# Patient Record
Sex: Male | Born: 1945
Health system: Southern US, Community
[De-identification: ages and names within clinical notes are randomized; demographics above are authoritative.]

## PROBLEM LIST (undated history)

## (undated) DIAGNOSIS — D649 Anemia, unspecified: Secondary | ICD-10-CM

## (undated) DIAGNOSIS — K922 Gastrointestinal hemorrhage, unspecified: Secondary | ICD-10-CM

## (undated) DIAGNOSIS — C801 Malignant (primary) neoplasm, unspecified: Secondary | ICD-10-CM

## (undated) DIAGNOSIS — N402 Nodular prostate without lower urinary tract symptoms: Secondary | ICD-10-CM

## (undated) DIAGNOSIS — K279 Peptic ulcer, site unspecified, unspecified as acute or chronic, without hemorrhage or perforation: Secondary | ICD-10-CM

## (undated) DIAGNOSIS — R972 Elevated prostate specific antigen [PSA]: Secondary | ICD-10-CM

## (undated) DIAGNOSIS — I10 Essential (primary) hypertension: Secondary | ICD-10-CM

## (undated) DIAGNOSIS — N400 Enlarged prostate without lower urinary tract symptoms: Secondary | ICD-10-CM

## (undated) DIAGNOSIS — E785 Hyperlipidemia, unspecified: Secondary | ICD-10-CM

## (undated) DIAGNOSIS — J449 Chronic obstructive pulmonary disease, unspecified: Secondary | ICD-10-CM

## (undated) HISTORY — DX: Hyperlipidemia, unspecified: E78.5

## (undated) HISTORY — DX: Chronic obstructive pulmonary disease, unspecified: J44.9

## (undated) HISTORY — DX: Gastrointestinal hemorrhage, unspecified: K92.2

## (undated) HISTORY — DX: Elevated prostate specific antigen (PSA): R97.20

## (undated) HISTORY — DX: Benign prostatic hyperplasia without lower urinary tract symptoms: N40.0

## (undated) HISTORY — DX: Peptic ulcer, site unspecified, unspecified as acute or chronic, without hemorrhage or perforation: K27.9

## (undated) HISTORY — DX: Anemia, unspecified: D64.9

## (undated) HISTORY — DX: Nodular prostate without lower urinary tract symptoms: N40.2

## (undated) HISTORY — DX: Essential (primary) hypertension: I10

---

## 2008-03-16 ENCOUNTER — Encounter: Payer: Self-pay | Admitting: Orthopedic Surgery

## 2009-12-27 ENCOUNTER — Ambulatory Visit: Payer: Self-pay | Admitting: Family Medicine

## 2011-04-15 DIAGNOSIS — L570 Actinic keratosis: Secondary | ICD-10-CM | POA: Diagnosis not present

## 2011-07-08 DIAGNOSIS — E7800 Pure hypercholesterolemia, unspecified: Secondary | ICD-10-CM | POA: Diagnosis not present

## 2011-07-08 DIAGNOSIS — I10 Essential (primary) hypertension: Secondary | ICD-10-CM | POA: Diagnosis not present

## 2011-08-21 DIAGNOSIS — B0233 Zoster keratitis: Secondary | ICD-10-CM | POA: Diagnosis not present

## 2011-08-23 DIAGNOSIS — H109 Unspecified conjunctivitis: Secondary | ICD-10-CM | POA: Diagnosis not present

## 2011-08-23 DIAGNOSIS — B0233 Zoster keratitis: Secondary | ICD-10-CM | POA: Diagnosis not present

## 2011-10-08 DIAGNOSIS — L570 Actinic keratosis: Secondary | ICD-10-CM | POA: Diagnosis not present

## 2011-10-08 DIAGNOSIS — Z872 Personal history of diseases of the skin and subcutaneous tissue: Secondary | ICD-10-CM | POA: Diagnosis not present

## 2012-01-27 DIAGNOSIS — I10 Essential (primary) hypertension: Secondary | ICD-10-CM | POA: Diagnosis not present

## 2012-01-27 DIAGNOSIS — E785 Hyperlipidemia, unspecified: Secondary | ICD-10-CM | POA: Diagnosis not present

## 2012-01-27 DIAGNOSIS — Z125 Encounter for screening for malignant neoplasm of prostate: Secondary | ICD-10-CM | POA: Diagnosis not present

## 2012-01-27 DIAGNOSIS — Z0000 Encounter for general adult medical examination without abnormal findings: Secondary | ICD-10-CM | POA: Diagnosis not present

## 2012-02-26 DIAGNOSIS — Z23 Encounter for immunization: Secondary | ICD-10-CM | POA: Diagnosis not present

## 2012-06-18 DIAGNOSIS — L570 Actinic keratosis: Secondary | ICD-10-CM | POA: Diagnosis not present

## 2012-07-24 DIAGNOSIS — I10 Essential (primary) hypertension: Secondary | ICD-10-CM | POA: Diagnosis not present

## 2012-07-27 DIAGNOSIS — I10 Essential (primary) hypertension: Secondary | ICD-10-CM | POA: Diagnosis not present

## 2012-10-07 DIAGNOSIS — Z872 Personal history of diseases of the skin and subcutaneous tissue: Secondary | ICD-10-CM | POA: Diagnosis not present

## 2012-10-07 DIAGNOSIS — Z1283 Encounter for screening for malignant neoplasm of skin: Secondary | ICD-10-CM | POA: Diagnosis not present

## 2012-10-07 DIAGNOSIS — L570 Actinic keratosis: Secondary | ICD-10-CM | POA: Diagnosis not present

## 2012-11-10 DIAGNOSIS — A4901 Methicillin susceptible Staphylococcus aureus infection, unspecified site: Secondary | ICD-10-CM | POA: Diagnosis not present

## 2012-11-10 DIAGNOSIS — L723 Sebaceous cyst: Secondary | ICD-10-CM | POA: Diagnosis not present

## 2012-11-12 DIAGNOSIS — L0291 Cutaneous abscess, unspecified: Secondary | ICD-10-CM | POA: Diagnosis not present

## 2012-11-12 DIAGNOSIS — A4901 Methicillin susceptible Staphylococcus aureus infection, unspecified site: Secondary | ICD-10-CM | POA: Diagnosis not present

## 2012-11-16 DIAGNOSIS — L723 Sebaceous cyst: Secondary | ICD-10-CM | POA: Diagnosis not present

## 2012-12-22 DIAGNOSIS — Z23 Encounter for immunization: Secondary | ICD-10-CM | POA: Diagnosis not present

## 2013-02-01 DIAGNOSIS — D509 Iron deficiency anemia, unspecified: Secondary | ICD-10-CM | POA: Diagnosis not present

## 2013-02-01 DIAGNOSIS — E785 Hyperlipidemia, unspecified: Secondary | ICD-10-CM | POA: Diagnosis not present

## 2013-02-01 DIAGNOSIS — Z125 Encounter for screening for malignant neoplasm of prostate: Secondary | ICD-10-CM | POA: Diagnosis not present

## 2013-02-01 DIAGNOSIS — I10 Essential (primary) hypertension: Secondary | ICD-10-CM | POA: Diagnosis not present

## 2013-02-01 DIAGNOSIS — J019 Acute sinusitis, unspecified: Secondary | ICD-10-CM | POA: Diagnosis not present

## 2013-02-01 DIAGNOSIS — Z0000 Encounter for general adult medical examination without abnormal findings: Secondary | ICD-10-CM | POA: Diagnosis not present

## 2013-02-04 DIAGNOSIS — D509 Iron deficiency anemia, unspecified: Secondary | ICD-10-CM | POA: Diagnosis not present

## 2013-02-04 DIAGNOSIS — Z8719 Personal history of other diseases of the digestive system: Secondary | ICD-10-CM | POA: Diagnosis not present

## 2013-02-15 ENCOUNTER — Ambulatory Visit: Payer: Self-pay

## 2013-02-15 DIAGNOSIS — R05 Cough: Secondary | ICD-10-CM | POA: Diagnosis not present

## 2013-02-15 DIAGNOSIS — R059 Cough, unspecified: Secondary | ICD-10-CM | POA: Diagnosis not present

## 2013-02-15 DIAGNOSIS — J189 Pneumonia, unspecified organism: Secondary | ICD-10-CM | POA: Diagnosis not present

## 2013-02-17 DIAGNOSIS — J449 Chronic obstructive pulmonary disease, unspecified: Secondary | ICD-10-CM | POA: Diagnosis not present

## 2013-02-17 DIAGNOSIS — D539 Nutritional anemia, unspecified: Secondary | ICD-10-CM | POA: Diagnosis not present

## 2013-02-18 HISTORY — PX: ESOPHAGOGASTRODUODENOSCOPY: SHX1529

## 2013-02-18 HISTORY — PX: COLONOSCOPY: SHX174

## 2013-03-12 DIAGNOSIS — J449 Chronic obstructive pulmonary disease, unspecified: Secondary | ICD-10-CM | POA: Diagnosis not present

## 2013-03-12 DIAGNOSIS — D539 Nutritional anemia, unspecified: Secondary | ICD-10-CM | POA: Diagnosis not present

## 2013-03-12 DIAGNOSIS — J4489 Other specified chronic obstructive pulmonary disease: Secondary | ICD-10-CM | POA: Diagnosis not present

## 2013-03-16 ENCOUNTER — Ambulatory Visit: Payer: Self-pay | Admitting: Gastroenterology

## 2013-03-16 DIAGNOSIS — D509 Iron deficiency anemia, unspecified: Secondary | ICD-10-CM | POA: Diagnosis not present

## 2013-03-16 DIAGNOSIS — D131 Benign neoplasm of stomach: Secondary | ICD-10-CM | POA: Diagnosis not present

## 2013-03-16 DIAGNOSIS — Z8711 Personal history of peptic ulcer disease: Secondary | ICD-10-CM | POA: Diagnosis not present

## 2013-03-16 DIAGNOSIS — K644 Residual hemorrhoidal skin tags: Secondary | ICD-10-CM | POA: Diagnosis not present

## 2013-03-16 DIAGNOSIS — I10 Essential (primary) hypertension: Secondary | ICD-10-CM | POA: Diagnosis not present

## 2013-03-16 DIAGNOSIS — Z79899 Other long term (current) drug therapy: Secondary | ICD-10-CM | POA: Diagnosis not present

## 2013-03-16 DIAGNOSIS — D649 Anemia, unspecified: Secondary | ICD-10-CM | POA: Diagnosis not present

## 2013-03-16 DIAGNOSIS — Z7982 Long term (current) use of aspirin: Secondary | ICD-10-CM | POA: Diagnosis not present

## 2013-03-16 DIAGNOSIS — K2100 Gastro-esophageal reflux disease with esophagitis, without bleeding: Secondary | ICD-10-CM | POA: Diagnosis not present

## 2013-03-16 DIAGNOSIS — K449 Diaphragmatic hernia without obstruction or gangrene: Secondary | ICD-10-CM | POA: Diagnosis not present

## 2013-03-16 DIAGNOSIS — K648 Other hemorrhoids: Secondary | ICD-10-CM | POA: Diagnosis not present

## 2013-03-18 LAB — PATHOLOGY REPORT

## 2013-04-07 DIAGNOSIS — D500 Iron deficiency anemia secondary to blood loss (chronic): Secondary | ICD-10-CM | POA: Diagnosis not present

## 2013-04-07 DIAGNOSIS — K2100 Gastro-esophageal reflux disease with esophagitis, without bleeding: Secondary | ICD-10-CM | POA: Diagnosis not present

## 2013-04-07 DIAGNOSIS — K219 Gastro-esophageal reflux disease without esophagitis: Secondary | ICD-10-CM | POA: Diagnosis not present

## 2013-04-20 DIAGNOSIS — D509 Iron deficiency anemia, unspecified: Secondary | ICD-10-CM | POA: Diagnosis not present

## 2013-06-21 ENCOUNTER — Ambulatory Visit: Payer: Self-pay | Admitting: Gastroenterology

## 2013-06-21 DIAGNOSIS — K2100 Gastro-esophageal reflux disease with esophagitis, without bleeding: Secondary | ICD-10-CM | POA: Diagnosis not present

## 2013-06-21 DIAGNOSIS — Z79899 Other long term (current) drug therapy: Secondary | ICD-10-CM | POA: Diagnosis not present

## 2013-06-21 DIAGNOSIS — K449 Diaphragmatic hernia without obstruction or gangrene: Secondary | ICD-10-CM | POA: Diagnosis not present

## 2013-06-21 DIAGNOSIS — I10 Essential (primary) hypertension: Secondary | ICD-10-CM | POA: Diagnosis not present

## 2013-06-21 DIAGNOSIS — K209 Esophagitis, unspecified without bleeding: Secondary | ICD-10-CM | POA: Diagnosis not present

## 2013-06-21 DIAGNOSIS — Z8711 Personal history of peptic ulcer disease: Secondary | ICD-10-CM | POA: Diagnosis not present

## 2013-06-21 DIAGNOSIS — Z7982 Long term (current) use of aspirin: Secondary | ICD-10-CM | POA: Diagnosis not present

## 2013-06-21 DIAGNOSIS — K219 Gastro-esophageal reflux disease without esophagitis: Secondary | ICD-10-CM | POA: Diagnosis not present

## 2013-06-21 DIAGNOSIS — D131 Benign neoplasm of stomach: Secondary | ICD-10-CM | POA: Diagnosis not present

## 2013-06-21 DIAGNOSIS — D509 Iron deficiency anemia, unspecified: Secondary | ICD-10-CM | POA: Diagnosis not present

## 2013-06-22 LAB — PATHOLOGY REPORT

## 2013-06-28 DIAGNOSIS — D509 Iron deficiency anemia, unspecified: Secondary | ICD-10-CM | POA: Diagnosis not present

## 2013-08-09 DIAGNOSIS — I10 Essential (primary) hypertension: Secondary | ICD-10-CM | POA: Diagnosis not present

## 2013-08-18 DIAGNOSIS — D509 Iron deficiency anemia, unspecified: Secondary | ICD-10-CM | POA: Diagnosis not present

## 2013-10-05 DIAGNOSIS — H5210 Myopia, unspecified eye: Secondary | ICD-10-CM | POA: Diagnosis not present

## 2013-10-05 DIAGNOSIS — H52229 Regular astigmatism, unspecified eye: Secondary | ICD-10-CM | POA: Diagnosis not present

## 2013-10-05 DIAGNOSIS — H524 Presbyopia: Secondary | ICD-10-CM | POA: Diagnosis not present

## 2013-10-05 DIAGNOSIS — H2510 Age-related nuclear cataract, unspecified eye: Secondary | ICD-10-CM | POA: Diagnosis not present

## 2013-10-13 DIAGNOSIS — L570 Actinic keratosis: Secondary | ICD-10-CM | POA: Diagnosis not present

## 2013-10-13 DIAGNOSIS — Z872 Personal history of diseases of the skin and subcutaneous tissue: Secondary | ICD-10-CM | POA: Diagnosis not present

## 2013-10-13 DIAGNOSIS — Z1283 Encounter for screening for malignant neoplasm of skin: Secondary | ICD-10-CM | POA: Diagnosis not present

## 2013-11-09 DIAGNOSIS — Z23 Encounter for immunization: Secondary | ICD-10-CM | POA: Diagnosis not present

## 2014-02-15 DIAGNOSIS — J449 Chronic obstructive pulmonary disease, unspecified: Secondary | ICD-10-CM | POA: Diagnosis not present

## 2014-02-15 DIAGNOSIS — E785 Hyperlipidemia, unspecified: Secondary | ICD-10-CM | POA: Diagnosis not present

## 2014-02-15 DIAGNOSIS — Z0000 Encounter for general adult medical examination without abnormal findings: Secondary | ICD-10-CM | POA: Diagnosis not present

## 2014-02-15 DIAGNOSIS — D649 Anemia, unspecified: Secondary | ICD-10-CM | POA: Diagnosis not present

## 2014-02-15 DIAGNOSIS — Z125 Encounter for screening for malignant neoplasm of prostate: Secondary | ICD-10-CM | POA: Diagnosis not present

## 2014-02-15 DIAGNOSIS — I10 Essential (primary) hypertension: Secondary | ICD-10-CM | POA: Diagnosis not present

## 2014-02-15 DIAGNOSIS — K922 Gastrointestinal hemorrhage, unspecified: Secondary | ICD-10-CM | POA: Diagnosis not present

## 2014-02-15 DIAGNOSIS — Z23 Encounter for immunization: Secondary | ICD-10-CM | POA: Diagnosis not present

## 2014-03-21 DIAGNOSIS — N402 Nodular prostate without lower urinary tract symptoms: Secondary | ICD-10-CM | POA: Diagnosis not present

## 2014-03-21 DIAGNOSIS — R972 Elevated prostate specific antigen [PSA]: Secondary | ICD-10-CM | POA: Diagnosis not present

## 2014-06-08 DIAGNOSIS — B0052 Herpesviral keratitis: Secondary | ICD-10-CM | POA: Diagnosis not present

## 2014-06-10 DIAGNOSIS — B0052 Herpesviral keratitis: Secondary | ICD-10-CM | POA: Diagnosis not present

## 2014-06-13 DIAGNOSIS — B0052 Herpesviral keratitis: Secondary | ICD-10-CM | POA: Diagnosis not present

## 2014-06-28 DIAGNOSIS — I10 Essential (primary) hypertension: Secondary | ICD-10-CM | POA: Insufficient documentation

## 2014-06-28 DIAGNOSIS — D649 Anemia, unspecified: Secondary | ICD-10-CM | POA: Insufficient documentation

## 2014-06-28 DIAGNOSIS — E785 Hyperlipidemia, unspecified: Secondary | ICD-10-CM | POA: Insufficient documentation

## 2014-06-28 DIAGNOSIS — J449 Chronic obstructive pulmonary disease, unspecified: Secondary | ICD-10-CM | POA: Insufficient documentation

## 2014-08-01 DIAGNOSIS — B0052 Herpesviral keratitis: Secondary | ICD-10-CM | POA: Diagnosis not present

## 2014-08-03 DIAGNOSIS — B0052 Herpesviral keratitis: Secondary | ICD-10-CM | POA: Diagnosis not present

## 2014-08-08 DIAGNOSIS — B0052 Herpesviral keratitis: Secondary | ICD-10-CM | POA: Diagnosis not present

## 2014-08-10 DIAGNOSIS — B0052 Herpesviral keratitis: Secondary | ICD-10-CM | POA: Diagnosis not present

## 2014-08-17 ENCOUNTER — Encounter: Payer: Self-pay | Admitting: Family Medicine

## 2014-08-17 ENCOUNTER — Ambulatory Visit (INDEPENDENT_AMBULATORY_CARE_PROVIDER_SITE_OTHER): Payer: Medicare Other | Admitting: Family Medicine

## 2014-08-17 VITALS — BP 114/65 | HR 56 | Temp 97.7°F | Ht 67.7 in | Wt 205.0 lb

## 2014-08-17 DIAGNOSIS — E785 Hyperlipidemia, unspecified: Secondary | ICD-10-CM | POA: Diagnosis not present

## 2014-08-17 DIAGNOSIS — K922 Gastrointestinal hemorrhage, unspecified: Secondary | ICD-10-CM | POA: Diagnosis not present

## 2014-08-17 DIAGNOSIS — R5383 Other fatigue: Secondary | ICD-10-CM | POA: Diagnosis not present

## 2014-08-17 DIAGNOSIS — I10 Essential (primary) hypertension: Secondary | ICD-10-CM

## 2014-08-17 DIAGNOSIS — G479 Sleep disorder, unspecified: Secondary | ICD-10-CM

## 2014-08-17 DIAGNOSIS — J42 Unspecified chronic bronchitis: Secondary | ICD-10-CM

## 2014-08-17 MED ORDER — LOSARTAN POTASSIUM 100 MG PO TABS: 100.0000 mg | ORAL_TABLET | Freq: Every day | ORAL | Status: DC

## 2014-08-17 MED ORDER — AMLODIPINE BESYLATE 10 MG PO TABS: 10.0000 mg | ORAL_TABLET | Freq: Every day | ORAL | Status: DC

## 2014-08-17 MED ORDER — PANTOPRAZOLE SODIUM 40 MG PO TBEC: 40.0000 mg | DELAYED_RELEASE_TABLET | Freq: Every day | ORAL | Status: DC

## 2014-08-17 MED ORDER — AZELASTINE HCL 0.15 % NA SOLN: 2.0000 | NASAL | Status: DC

## 2014-08-17 MED ORDER — FLUTICASONE PROPIONATE 50 MCG/ACT NA SUSP: 2.0000 | Freq: Every day | NASAL | Status: DC

## 2014-08-17 MED ORDER — CARVEDILOL 25 MG PO TABS: 25.0000 mg | ORAL_TABLET | Freq: Two times a day (BID) | ORAL | Status: DC

## 2014-08-17 MED ORDER — ALBUTEROL SULFATE HFA 108 (90 BASE) MCG/ACT IN AERS: 2.0000 | INHALATION_SPRAY | Freq: Four times a day (QID) | RESPIRATORY_TRACT | Status: DC | PRN

## 2014-08-17 NOTE — Assessment & Plan Note (Signed)
Diet controled 

## 2014-08-17 NOTE — Assessment & Plan Note (Signed)
The current medical regimen is effective;  continue present plan and medications.  

## 2014-08-17 NOTE — Progress Notes (Signed)
BP 114/65 mmHg  Pulse 56  Temp(Src) 97.7 F (36.5 C)  Ht 5' 7.7" (1.72 m)  Wt 205 lb (92.987 kg)  BMI 31.43 kg/m2  SpO2 97%   Subjective:    Patient ID: Jonathan Burns, male    DOB: 17-Jun-1945, 69 y.o.   MRN: [id number]  HPI: Jonathan Burns is a 69 y.o. male  Chief Complaint  Patient presents with  . COPD  . Hypertension  . Gastrophageal Reflux  doing well long term Problems from last visit stable Leg no worse maybe better Breathing well  Good BP control stomach and reflux no sx Takes meds every day Discussed OSA sx  Wife cant sleep with pt due to severe snoring and apnea spells Has to nap at lunch to get through thew day See sleep questions Relevant past medical, surgical, family and social history reviewed and updated as indicated. Interim medical history since our last visit reviewed. Allergies and medications reviewed and updated.  Review of Systems  Constitutional: Negative.   Respiratory: Negative.   Cardiovascular: Negative.   Gastrointestinal: Negative.     Per HPI unless specifically indicated above     Objective:    BP 114/65 mmHg  Pulse 56  Temp(Src) 97.7 F (36.5 C)  Ht 5' 7.7" (1.72 m)  Wt 205 lb (92.987 kg)  BMI 31.43 kg/m2  SpO2 97%  Wt Readings from Last 3 Encounters:  08/17/14 205 lb (92.987 kg)  02/15/14 207 lb (93.895 kg)    Physical Exam  Constitutional: He is oriented to person, place, and time. He appears well-developed and well-nourished. No distress.  HENT:  Head: Normocephalic and atraumatic.  Right Ear: Hearing normal.  Left Ear: Hearing normal.  Nose: Nose normal.  Eyes: Conjunctivae and lids are normal. Right eye exhibits no discharge. Left eye exhibits no discharge. No scleral icterus.  Neck:  Neck circ 16'  Cardiovascular: Normal rate, regular rhythm and normal heart sounds.   Pulmonary/Chest: Effort normal and breath sounds normal. No respiratory distress.  Musculoskeletal: Normal range of motion.  Neurological: He  is alert and oriented to person, place, and time.  Skin: Skin is intact. No rash noted.  Psychiatric: He has a normal mood and affect. His speech is normal and behavior is normal. Judgment and thought content normal. Cognition and memory are normal.        Assessment & Plan:   Problem List Items Addressed This Visit      Cardiovascular and Mediastinum   Hypertension - Primary    The current medical regimen is effective;  continue present plan and medications.       Relevant Medications   amLODipine (NORVASC) 10 MG tablet   carvedilol (COREG) 25 MG tablet   losartan (COZAAR) 100 MG tablet   Other Relevant Orders   Basic metabolic panel     Respiratory   COPD (chronic obstructive pulmonary disease)    The current medical regimen is effective;  continue present plan and medications.       Relevant Medications   albuterol (PROVENTIL HFA;VENTOLIN HFA) 108 (90 BASE) MCG/ACT inhaler   Azelastine HCl 0.15 % SOLN   fluticasone (FLONASE) 50 MCG/ACT nasal spray     Digestive   Chronic GI bleeding    The current medical regimen is effective;  continue present plan and medications.       Relevant Medications   pantoprazole (PROTONIX) 40 MG tablet     Other   Hyperlipidemia    Diet controled  Relevant Medications   amLODipine (NORVASC) 10 MG tablet   carvedilol (COREG) 25 MG tablet   losartan (COZAAR) 100 MG tablet       Follow up plan: Return in about 6 months (around 02/16/2015) for Physical Exam.

## 2014-08-18 LAB — BASIC METABOLIC PANEL
BUN / CREAT RATIO: 12 (ref 10–22)
BUN: 13 mg/dL (ref 8–27)
CHLORIDE: 100 mmol/L (ref 97–108)
CO2: 24 mmol/L (ref 18–29)
Calcium: 9.2 mg/dL (ref 8.6–10.2)
Creatinine, Ser: 1.07 mg/dL (ref 0.76–1.27)
GFR, EST AFRICAN AMERICAN: 81 mL/min/{1.73_m2} (ref >59)
GFR, EST NON AFRICAN AMERICAN: 70 mL/min/{1.73_m2} (ref >59)
Glucose: 86 mg/dL (ref 65–99)
Potassium: 5.3 mmol/L — ABNORMAL HIGH (ref 3.5–5.2)
SODIUM: 138 mmol/L (ref 134–144)

## 2014-08-29 DIAGNOSIS — G4733 Obstructive sleep apnea (adult) (pediatric): Secondary | ICD-10-CM | POA: Diagnosis not present

## 2014-08-30 ENCOUNTER — Telehealth: Payer: Self-pay | Admitting: Family Medicine

## 2014-08-30 DIAGNOSIS — D500 Iron deficiency anemia secondary to blood loss (chronic): Secondary | ICD-10-CM

## 2014-08-30 DIAGNOSIS — G4733 Obstructive sleep apnea (adult) (pediatric): Secondary | ICD-10-CM | POA: Diagnosis not present

## 2014-08-30 NOTE — Telephone Encounter (Signed)
Pt went in for sleep study last night and says he failed and now has to go back tonight to be fitted for his mask and the person conducting the study asked for him to have something similar to [place] so that he goes into a deeper sleep. Pt would like [place] to Send it to [employer]

## 2014-08-31 ENCOUNTER — Other Ambulatory Visit: Payer: Self-pay | Admitting: Family Medicine

## 2014-08-31 ENCOUNTER — Telehealth: Payer: Self-pay | Admitting: Family Medicine

## 2014-08-31 DIAGNOSIS — G4733 Obstructive sleep apnea (adult) (pediatric): Secondary | ICD-10-CM

## 2014-08-31 DIAGNOSIS — Z9989 Dependence on other enabling machines and devices: Principal | ICD-10-CM

## 2014-08-31 DIAGNOSIS — G2581 Restless legs syndrome: Secondary | ICD-10-CM

## 2014-08-31 DIAGNOSIS — K922 Gastrointestinal hemorrhage, unspecified: Secondary | ICD-10-CM

## 2014-08-31 MED ORDER — ZOLPIDEM TARTRATE 10 MG PO TABS: 10.0000 mg | ORAL_TABLET | Freq: Every evening | ORAL | Status: DC | PRN

## 2014-08-31 NOTE — Telephone Encounter (Signed)
Call pt re sleep apnea

## 2014-08-31 NOTE — Addendum Note (Signed)
Addended byGolden Pop on: 08/31/2014 05:09 PM   Modules accepted: Orders

## 2014-08-31 NOTE — Telephone Encounter (Signed)
Pt new dx of OSA and restless legs Will check ferritin

## 2014-08-31 NOTE — Telephone Encounter (Signed)
Calling about sleep report

## 2014-09-01 ENCOUNTER — Other Ambulatory Visit: Payer: Medicare Other

## 2014-09-01 DIAGNOSIS — K922 Gastrointestinal hemorrhage, unspecified: Secondary | ICD-10-CM

## 2014-09-01 DIAGNOSIS — G2581 Restless legs syndrome: Secondary | ICD-10-CM

## 2014-09-02 LAB — FERRITIN: Ferritin: 10 ng/mL — ABNORMAL LOW (ref 30–400)

## 2014-09-06 NOTE — Progress Notes (Signed)
Phone call with patient ferritin done because of restless leg syndrome discovered on CPAP testing Patient's ferritin of 10 patient had complete GI workup last year Will check CBC and call gastroenterology for further advice on need for workup may or may not need.

## 2014-09-07 ENCOUNTER — Telehealth: Payer: Self-pay | Admitting: Family Medicine

## 2014-09-07 ENCOUNTER — Other Ambulatory Visit: Payer: Medicare Other

## 2014-09-07 DIAGNOSIS — D500 Iron deficiency anemia secondary to blood loss (chronic): Secondary | ICD-10-CM

## 2014-09-07 MED ORDER — IRON 325 (65 FE) MG PO TABS: 1.0000 | ORAL_TABLET | Freq: Every day | ORAL | Status: DC

## 2014-09-07 NOTE — Telephone Encounter (Signed)
Pt called stated [facility] was supposed to send an RX to [place] for him yesterday, the pharmacy has not received it. RX is for iron pills. Please resend. Thanks.

## 2014-09-07 NOTE — Addendum Note (Signed)
Addended byGolden Pop on: 09/07/2014 05:18 PM   Modules accepted: Orders

## 2014-09-07 NOTE — Telephone Encounter (Signed)
Do you know what Rx patient is talking about?

## 2014-09-08 LAB — CBC WITH DIFFERENTIAL/PLATELET
Basophils Absolute: 0 10*3/uL (ref 0.0–0.2)
Basos: 1 %
EOS (ABSOLUTE): 0.3 10*3/uL (ref 0.0–0.4)
EOS: 5 %
Hematocrit: 31.2 % — ABNORMAL LOW (ref 37.5–51.0)
Hemoglobin: 9.3 g/dL — ABNORMAL LOW (ref 12.6–17.7)
IMMATURE GRANULOCYTES: 0 %
Immature Grans (Abs): 0 10*3/uL (ref 0.0–0.1)
Lymphocytes Absolute: 1.2 10*3/uL (ref 0.7–3.1)
Lymphs: 20 %
MCH: 21.9 pg — ABNORMAL LOW (ref 26.6–33.0)
MCHC: 29.8 g/dL — ABNORMAL LOW (ref 31.5–35.7)
MCV: 74 fL — AB (ref 79–97)
MONOCYTES: 16 %
Monocytes Absolute: 0.9 10*3/uL (ref 0.1–0.9)
NEUTROS PCT: 58 %
Neutrophils Absolute: 3.3 10*3/uL (ref 1.4–7.0)
Platelets: 329 10*3/uL (ref 150–379)
RBC: 4.24 x10E6/uL (ref 4.14–5.80)
RDW: 18.3 % — ABNORMAL HIGH (ref 12.3–15.4)
WBC: 5.7 10*3/uL (ref 3.4–10.8)

## 2014-09-08 NOTE — Telephone Encounter (Signed)
Phone call with [facility] clinic GI Patient with recurrence of iron deficiency anemia. Had GI workup about a year ago at [facility]. Patient will be contacted by [facility] clinic for further GI follow-up

## 2014-09-13 DIAGNOSIS — D500 Iron deficiency anemia secondary to blood loss (chronic): Secondary | ICD-10-CM | POA: Diagnosis not present

## 2014-09-28 ENCOUNTER — Inpatient Hospital Stay: Payer: Medicare Other

## 2014-09-28 ENCOUNTER — Encounter (INDEPENDENT_AMBULATORY_CARE_PROVIDER_SITE_OTHER): Payer: Self-pay

## 2014-09-28 ENCOUNTER — Inpatient Hospital Stay: Payer: Medicare Other | Attending: Oncology | Admitting: Oncology

## 2014-09-28 VITALS — BP 137/77 | HR 62 | Temp 98.8°F | Resp 18 | Ht 70.87 in | Wt 205.5 lb

## 2014-09-28 DIAGNOSIS — Z79899 Other long term (current) drug therapy: Secondary | ICD-10-CM

## 2014-09-28 DIAGNOSIS — I10 Essential (primary) hypertension: Secondary | ICD-10-CM | POA: Insufficient documentation

## 2014-09-28 DIAGNOSIS — D509 Iron deficiency anemia, unspecified: Secondary | ICD-10-CM | POA: Diagnosis not present

## 2014-09-28 DIAGNOSIS — J449 Chronic obstructive pulmonary disease, unspecified: Secondary | ICD-10-CM | POA: Insufficient documentation

## 2014-09-28 DIAGNOSIS — Z87891 Personal history of nicotine dependence: Secondary | ICD-10-CM | POA: Insufficient documentation

## 2014-09-28 LAB — FERRITIN: Ferritin: 31 ng/mL (ref 24–336)

## 2014-09-28 LAB — CBC
HEMATOCRIT: 39.1 % — AB (ref 40.0–52.0)
HEMOGLOBIN: 12.5 g/dL — AB (ref 13.0–18.0)
MCH: 25.2 pg — ABNORMAL LOW (ref 26.0–34.0)
MCHC: 31.8 g/dL — AB (ref 32.0–36.0)
MCV: 79 fL — ABNORMAL LOW (ref 80.0–100.0)
PLATELETS: 231 10*3/uL (ref 150–440)
RBC: 4.95 MIL/uL (ref 4.40–5.90)
RDW: 28 % — ABNORMAL HIGH (ref 11.5–14.5)
WBC: 6.1 10*3/uL (ref 3.8–10.6)

## 2014-09-28 LAB — DAT, POLYSPECIFIC AHG (ARMC ONLY): POLYSPECIFIC AHG TEST: NEGATIVE

## 2014-09-28 LAB — FOLATE: Folate: 41.3 ng/mL (ref >5.9)

## 2014-09-28 LAB — VITAMIN B12: VITAMIN B 12: 616 pg/mL (ref 180–914)

## 2014-09-28 LAB — IRON AND TIBC
IRON: 176 ug/dL (ref 45–182)
Saturation Ratios: 46 % — ABNORMAL HIGH (ref 17.9–39.5)
TIBC: 385 ug/dL (ref 250–450)
UIBC: 209 ug/dL

## 2014-09-28 LAB — LACTATE DEHYDROGENASE: LDH: 165 U/L (ref 98–192)

## 2014-09-29 ENCOUNTER — Encounter: Payer: Self-pay | Admitting: *Deleted

## 2014-09-29 LAB — HAPTOGLOBIN: Haptoglobin: 173 mg/dL (ref 34–200)

## 2014-09-29 NOTE — Progress Notes (Signed)
[place]  [phone] [phone]  ID: CARSYN TAUBMAN OB: [birth year]  MR#: [id number]  XVQ#:[id number]  No care team member to display  CHIEF COMPLAINT:  Chief Complaint  Patient presents with  . Follow-up    IDA    INTERVAL HISTORY: Patient is a 69 year old male who was found to have a declining hemoglobin and iron stores on routine blood work. Subsequent EGD, colonoscopy, and capsule endoscopy did not reveal a definitive source. Patient is currently taking oral iron and tolerating it well. He currently feels well and is asymptomatic. He denies any weakness or fatigue. He has a good appetite and denies weight loss. He has no neurologic complaints. He denies any recent fevers. He denies any chest pain or shortness of breath. He denies any nausea, vomiting, constipation, or diarrhea. He has no melanotic or hematochezia. Patient feels at his baseline and offers no specific complaints today.  REVIEW OF SYSTEMS:   Review of Systems  Constitutional: Negative.   Respiratory: Negative.   Cardiovascular: Negative.   Gastrointestinal: Negative.  Negative for blood in stool and melena.    As per HPI. Otherwise, a complete review of systems is negatve.  PAST MEDICAL HISTORY: Past Medical History  Diagnosis Date  . Hyperlipidemia   . Hypertension   . COPD (chronic obstructive pulmonary disease)   . Anemia   . PUD (peptic ulcer disease)   . Chronic GI bleeding     PAST SURGICAL HISTORY: Past Surgical History  Procedure Laterality Date  . Colonoscopy  2015  . Esophagogastroduodenoscopy  2015    FAMILY HISTORY Family History  Problem Relation Age of Onset  . Cancer Mother     breast  . Diabetes Mother   . Aneurysm Father        ADVANCED DIRECTIVES:    HEALTH MAINTENANCE: Social History  Substance Use Topics  . Smoking status: Former Smoker -- 1.00 packs/day for 30 years    Types: Cigarettes    Quit date: 08/17/1986  . Smokeless  tobacco: Never Used  . Alcohol Use: 0.0 oz/week    0 Standard drinks or equivalent per week     Comment: occasional     Colonoscopy:  PAP:  Bone density:  Lipid panel:  No Known Allergies  Current Outpatient Prescriptions  Medication Sig Dispense Refill  . amLODipine (NORVASC) 10 MG tablet Take 1 tablet (10 mg total) by mouth daily. 90 tablet 4  . aspirin EC 81 MG tablet Take 81 mg by mouth daily.    . carvedilol (COREG) 25 MG tablet Take 1 tablet (25 mg total) by mouth 2 (two) times daily with a meal. 180 tablet 4  . Ferrous Sulfate (IRON) 325 (65 FE) MG TABS Take 1 tablet by mouth daily. 30 each 12  . LORazepam (ATIVAN) 1 MG tablet Take 1 mg by mouth every 8 (eight) hours.    Marland Kitchen losartan (COZAAR) 100 MG tablet Take 1 tablet (100 mg total) by mouth daily. 90 tablet 4  . Multiple Vitamins-Minerals (MENS MULTIVITAMIN PLUS PO) Take by mouth daily.    . Omega-3 Fatty Acids (FISH OIL) 1000 MG CAPS Take by mouth.    . pantoprazole (PROTONIX) 40 MG tablet Take 1 tablet (40 mg total) by mouth daily. 90 tablet 4   No current facility-administered medications for this visit.    OBJECTIVE: Filed Vitals:   09/28/14 1127  BP: 137/77  Pulse: 62  Temp: 98.8 F (37.1 C)  Resp: 18  Body mass index is 28.77 kg/(m^2).    ECOG FS:0 - Asymptomatic  General: Well-developed, well-nourished, no acute distress. Eyes: Pink conjunctiva, anicteric sclera. HEENT: Normocephalic, moist mucous membranes, clear oropharnyx. Lungs: Clear to auscultation bilaterally. Heart: Regular rate and rhythm. No rubs, murmurs, or gallops. Abdomen: Soft, nontender, nondistended. No organomegaly noted, normoactive bowel sounds. Musculoskeletal: No edema, cyanosis, or clubbing. Neuro: Alert, answering all questions appropriately. Cranial nerves grossly intact. Skin: No rashes or petechiae noted. Psych: Normal affect. Lymphatics: No cervical, calvicular, axillary or inguinal LAD.   LAB RESULTS:  Lab Results    Component Value Date   NA 138 08/17/2014   K 5.3* 08/17/2014   CL 100 08/17/2014   CO2 24 08/17/2014   GLUCOSE 86 08/17/2014   BUN 13 08/17/2014   CREATININE 1.07 08/17/2014   CALCIUM 9.2 08/17/2014   GFRNONAA 70 08/17/2014   GFRAA 81 08/17/2014    Lab Results  Component Value Date   WBC 6.1 09/28/2014   NEUTROABS 3.3 09/07/2014   HGB 12.5* 09/28/2014   HCT 39.1* 09/28/2014   MCV 79.0* 09/28/2014   PLT 231 09/28/2014     STUDIES: No results found.  ASSESSMENT: Iron deficiency anemia, improving.  PLAN:    1. Anemia: Patient's iron stores and hemoglobin are essentially within normal limits. He has been instructed to continue taking his oral iron supplementation. Complete GI workup did not reveal a source of blood loss. The remainder of his laboratory work is either negative or within normal limits. No intervention is needed at this time. Return to clinic in 3 months with repeat laboratory work and further evaluation. If his hemoglobin continues to remain within normal limits, he can likely be discharged from clinic at that time.  Patient expressed understanding and was in agreement with this plan. He also understands that He can call clinic at any time with any questions, concerns, or complaints.    Lloyd Huger, MD   09/29/2014 8:51 AM

## 2014-10-10 ENCOUNTER — Encounter: Payer: Self-pay | Admitting: Urology

## 2014-10-10 ENCOUNTER — Ambulatory Visit (INDEPENDENT_AMBULATORY_CARE_PROVIDER_SITE_OTHER): Payer: Medicare Other | Admitting: Urology

## 2014-10-10 VITALS — BP 134/70 | HR 55 | Ht 69.0 in | Wt 209.2 lb

## 2014-10-10 DIAGNOSIS — R972 Elevated prostate specific antigen [PSA]: Secondary | ICD-10-CM | POA: Insufficient documentation

## 2014-10-10 DIAGNOSIS — N402 Nodular prostate without lower urinary tract symptoms: Secondary | ICD-10-CM | POA: Insufficient documentation

## 2014-10-10 DIAGNOSIS — Z8546 Personal history of malignant neoplasm of prostate: Secondary | ICD-10-CM | POA: Diagnosis not present

## 2014-10-10 DIAGNOSIS — N401 Enlarged prostate with lower urinary tract symptoms: Secondary | ICD-10-CM | POA: Diagnosis not present

## 2014-10-10 DIAGNOSIS — N138 Other obstructive and reflux uropathy: Secondary | ICD-10-CM

## 2014-10-10 LAB — BLADDER SCAN AMB NON-IMAGING: Scan Result: 40

## 2014-10-10 NOTE — Progress Notes (Signed)
10/10/2014 8:48 AM   [name] May 09, 1945 [id number]  Referring provider: No referring provider defined for this encounter.  Chief Complaint  Patient presents with  . Prostate Cancer    6 month recheck    HPI: Mr. Kondracki is a 69 year old white male who was originally referred to [place] for a possible prostate nodule found by his primary care physician had a rising PSA.  He was evaluated by Dr. Erlene Quan six months ago and the nodule was not appreciated at that time.    His IPSS score today is 2, which is mild lower urinary tract symptomatology. He is pleased with his quality life due to his urinary symptoms. His PVR is 40 mL.    Patient states he is doing well and without complaints.   He denies any dysuria, hematuria or suprapubic pain.   His has not seen an urologist in the past.      He also denies any recent fevers, chills, nausea or vomiting.  He does not have a family history of PCa.      IPSS      10/10/14 0800       International Prostate Symptom Score   How often have you had the sensation of not emptying your bladder? Not at All     How often have you had to urinate less than every two hours? Less than 1 in 5 times     How often have you found you stopped and started again several times when you urinated? Not at All     How often have you found it difficult to postpone urination? Not at All     How often have you had a weak urinary stream? Not at All     How often have you had to strain to start urination? Not at All     How many times did you typically get up at night to urinate? 1 Time     Total IPSS Score 2     Quality of Life due to urinary symptoms   If you were to spend the rest of your life with your urinary condition just the way it is now how would you feel about that? Pleased        Score:  1-7 Mild 8-19 Moderate 20-35 Severe  PMH: Past Medical History  Diagnosis Date  . Hyperlipidemia   . Hypertension   . COPD (chronic obstructive pulmonary  disease)   . Anemia   . PUD (peptic ulcer disease)   . Chronic GI bleeding   . Prostate nodule   . Rising PSA level     Surgical History: Past Surgical History  Procedure Laterality Date  . Colonoscopy  2015  . Esophagogastroduodenoscopy  2015    Home Medications:    Medication List       This list is accurate as of: 10/10/14  8:48 AM.  Always use your most recent med list.               amLODipine 10 MG tablet  Commonly known as:  NORVASC  Take 1 tablet (10 mg total) by mouth daily.     aspirin EC 81 MG tablet  Take 81 mg by mouth daily.     carvedilol 25 MG tablet  Commonly known as:  COREG  Take 1 tablet (25 mg total) by mouth 2 (two) times daily with a meal.     Fish Oil 1000 MG Caps  Take by mouth.  Iron 325 (65 FE) MG Tabs  Take 1 tablet by mouth daily.     LORazepam 1 MG tablet  Commonly known as:  ATIVAN  Take 1 mg by mouth every 8 (eight) hours.     losartan 100 MG tablet  Commonly known as:  COZAAR  Take 1 tablet (100 mg total) by mouth daily.     MENS MULTIVITAMIN PLUS PO  Take by mouth daily.     pantoprazole 40 MG tablet  Commonly known as:  PROTONIX  Take 1 tablet (40 mg total) by mouth daily.        Allergies: No Known Allergies  Family History: Family History  Problem Relation Age of Onset  . Cancer Mother     breast  . Diabetes Mother   . Aneurysm Father   . Kidney disease Neg Hx   . Prostate cancer Neg Hx     Social History:  reports that he quit smoking about 28 years ago. His smoking use included Cigarettes. He has a 30 pack-year smoking history. He has never used smokeless tobacco. He reports that he drinks alcohol. He reports that he does not use illicit drugs.  ROS: UROLOGY Frequent Urination?: No Hard to postpone urination?: No Burning/pain with urination?: No Get up at night to urinate?: No Leakage of urine?: No Urine stream starts and stops?: No Trouble starting stream?: No Do you have to strain to  urinate?: No Blood in urine?: No Urinary tract infection?: No Sexually transmitted disease?: No Injury to kidneys or bladder?: No Painful intercourse?: No Weak stream?: No Erection problems?: No Penile pain?: No  Gastrointestinal Nausea?: No Vomiting?: No Indigestion/heartburn?: No Diarrhea?: No Constipation?: No  Constitutional Fever: No Night sweats?: No Weight loss?: No Fatigue?: No  Skin Skin rash/lesions?: No Itching?: No  Eyes Blurred vision?: No Double vision?: No  Ears/Nose/Throat Sore throat?: No Sinus problems?: No  Hematologic/Lymphatic Swollen glands?: No Easy bruising?: No  Cardiovascular Leg swelling?: No Chest pain?: No  Respiratory Cough?: No Shortness of breath?: No  Endocrine Excessive thirst?: No  Musculoskeletal Back pain?: No Joint pain?: No  Neurological Headaches?: No Dizziness?: No  Psychologic Depression?: No Anxiety?: No  Physical Exam: BP 134/70 mmHg  Pulse 55  Ht '5\' 9"'$  (1.753 m)  Wt 209 lb 3.2 oz (94.892 kg)  BMI 30.88 kg/m2  GU: Patient with circumcised phallus.  Urethral meatus is patent.  No penile discharge. No penile lesions or rashes. Scrotum without lesions, cysts, rashes and/or edema.  Testicles are located scrotally bilaterally. No masses are appreciated in the testicles. Left and right epididymis are normal. Rectal: Patient with  normal sphincter tone. Perineum without scarring or rashes. No rectal masses are appreciated. Prostate is approximately 60 grams, no nodules are appreciated. Seminal vesicles are normal.   Laboratory Data: Lab Results  Component Value Date   WBC 6.1 09/28/2014   HGB 12.5* 09/28/2014   HCT 39.1* 09/28/2014   MCV 79.0* 09/28/2014   PLT 231 09/28/2014    Lab Results  Component Value Date   CREATININE 1.07 08/17/2014    PSA trend: 3.2 on 12/15 2.3 on 12/14 2.0 on 12/13 2.3 on 11/12 1.03 on 11/10   Pertinent Imaging: Results for orders placed or performed in visit  on 10/10/14  BLADDER SCAN AMB NON-IMAGING  Result Value Ref Range   Scan Result 40     Assessment & Plan:    1. Prostate nodule:    Patient has a history of a prostate nodule found on  exam by his PCP.  It has not been appreciated through our exams.  We will continue to monitor with DRE's and PSA's and patient will RTC in 6 months.     2. Rising PSA:    Patient and Dr. Erlene Quan discussed the implications of PSA screening at his last visit 6 months ago.  Patient's PSA has been rising from 1.03 in 2010 to 3.2 in 12/15.   This velocity is less than 0.75ng/mL a year.  PSA is drawn today.  We will continue to monitor closely with obtaining his PSA in 6 months.  Patient will RTC in 6 months.    3. BPH with LUTS:    IPSS 2/1.  PVR is 40 mL.  Patient is pleased with his urinary symptoms at this time.  We will continue to monitor with IPSS and PVR when patient RTC in 6 months.    - PSA - BLADDER SCAN AMB NON-IMAGING  Nursing note for RTC:      - IPSS score      - PVR      -PSA (should have been drawn prior to appointment)      -EXAM   No Follow-up on file.  Zara Council, [facility] [address], [place], [place] [address] [phone]

## 2014-10-11 ENCOUNTER — Telehealth: Payer: Self-pay

## 2014-10-11 LAB — PSA: PROSTATE SPECIFIC AG, SERUM: 2.2 ng/mL (ref 0.0–4.0)

## 2014-10-11 NOTE — Telephone Encounter (Signed)
Not available

## 2014-10-11 NOTE — Telephone Encounter (Signed)
-----   Message from Nori Riis, PA-C sent at 10/11/2014 11:21 AM EDT ----- Patient's PSA is stable.  We will see him in 6 months.  PSA to be drawn before his next appointment.

## 2014-10-12 NOTE — Telephone Encounter (Signed)
-----   Message from Nori Riis, PA-C sent at 10/11/2014 11:21 AM EDT ----- Patient's PSA is stable.  We will see him in 6 months.  PSA to be drawn before his next appointment.

## 2014-10-12 NOTE — Telephone Encounter (Signed)
Spoke with pt wife in reference to PSA results. Wife voiced understanding.

## 2014-10-19 DIAGNOSIS — Z1283 Encounter for screening for malignant neoplasm of skin: Secondary | ICD-10-CM | POA: Diagnosis not present

## 2014-10-19 DIAGNOSIS — L570 Actinic keratosis: Secondary | ICD-10-CM | POA: Diagnosis not present

## 2014-10-19 DIAGNOSIS — Z872 Personal history of diseases of the skin and subcutaneous tissue: Secondary | ICD-10-CM | POA: Diagnosis not present

## 2014-10-20 DIAGNOSIS — L570 Actinic keratosis: Secondary | ICD-10-CM | POA: Diagnosis not present

## 2014-11-21 DIAGNOSIS — L570 Actinic keratosis: Secondary | ICD-10-CM | POA: Diagnosis not present

## 2014-11-29 DIAGNOSIS — Z23 Encounter for immunization: Secondary | ICD-10-CM | POA: Diagnosis not present

## 2014-12-02 DIAGNOSIS — E669 Obesity, unspecified: Secondary | ICD-10-CM | POA: Diagnosis not present

## 2014-12-02 DIAGNOSIS — G4733 Obstructive sleep apnea (adult) (pediatric): Secondary | ICD-10-CM | POA: Diagnosis not present

## 2014-12-06 DIAGNOSIS — D509 Iron deficiency anemia, unspecified: Secondary | ICD-10-CM | POA: Diagnosis not present

## 2014-12-06 DIAGNOSIS — K219 Gastro-esophageal reflux disease without esophagitis: Secondary | ICD-10-CM | POA: Diagnosis not present

## 2014-12-12 DIAGNOSIS — B0052 Herpesviral keratitis: Secondary | ICD-10-CM | POA: Diagnosis not present

## 2014-12-29 ENCOUNTER — Other Ambulatory Visit: Payer: Medicare Other

## 2014-12-29 ENCOUNTER — Ambulatory Visit: Payer: Medicare Other | Admitting: Oncology

## 2014-12-29 ENCOUNTER — Ambulatory Visit: Payer: Medicare Other

## 2015-02-23 ENCOUNTER — Ambulatory Visit (INDEPENDENT_AMBULATORY_CARE_PROVIDER_SITE_OTHER): Payer: Medicare Other | Admitting: Family Medicine

## 2015-02-23 ENCOUNTER — Encounter: Payer: Self-pay | Admitting: Family Medicine

## 2015-02-23 VITALS — BP 130/75 | HR 69 | Temp 97.8°F | Ht 68.2 in | Wt 214.0 lb

## 2015-02-23 DIAGNOSIS — N402 Nodular prostate without lower urinary tract symptoms: Secondary | ICD-10-CM

## 2015-02-23 DIAGNOSIS — K922 Gastrointestinal hemorrhage, unspecified: Secondary | ICD-10-CM

## 2015-02-23 DIAGNOSIS — G4733 Obstructive sleep apnea (adult) (pediatric): Secondary | ICD-10-CM | POA: Diagnosis not present

## 2015-02-23 DIAGNOSIS — Z113 Encounter for screening for infections with a predominantly sexual mode of transmission: Secondary | ICD-10-CM | POA: Diagnosis not present

## 2015-02-23 DIAGNOSIS — Z9989 Dependence on other enabling machines and devices: Secondary | ICD-10-CM

## 2015-02-23 DIAGNOSIS — Z0000 Encounter for general adult medical examination without abnormal findings: Secondary | ICD-10-CM | POA: Diagnosis not present

## 2015-02-23 DIAGNOSIS — J42 Unspecified chronic bronchitis: Secondary | ICD-10-CM

## 2015-02-23 DIAGNOSIS — Z87828 Personal history of other (healed) physical injury and trauma: Secondary | ICD-10-CM | POA: Diagnosis not present

## 2015-02-23 DIAGNOSIS — I10 Essential (primary) hypertension: Secondary | ICD-10-CM

## 2015-02-23 DIAGNOSIS — F419 Anxiety disorder, unspecified: Secondary | ICD-10-CM | POA: Diagnosis not present

## 2015-02-23 LAB — URINALYSIS, ROUTINE W REFLEX MICROSCOPIC: WBC, UA: NONE SEEN /hpf (ref 0–?)

## 2015-02-23 MED ORDER — LORAZEPAM 1 MG PO TABS: 1.0000 mg | ORAL_TABLET | Freq: Every day | ORAL | Status: DC | PRN

## 2015-02-23 NOTE — Assessment & Plan Note (Signed)
Has not noticed any problems is taking iron without problems and Prilosec without problems

## 2015-02-23 NOTE — Assessment & Plan Note (Signed)
occ anxiety spells does well with rare lorezapam use

## 2015-02-23 NOTE — Progress Notes (Signed)
BP 130/75 mmHg  Pulse 69  Temp(Src) 97.8 F (36.6 C)  Ht 5' 8.2" (1.732 m)  Wt 214 lb (97.07 kg)  BMI 32.36 kg/m2  SpO2 97%   Subjective:    Patient ID: Jonathan Burns, male    DOB: 1945/05/19, 70 y.o.   MRN: [id number]  HPI: Jonathan Burns is a 70 y.o. male  Chief Complaint  Patient presents with  . Annual Exam   Pt  recheck medical problems also with blood pressure doing well with no complaints from medications taken faithfully without side effects. Reflux stable  Albuterol can is lasted more than a year. Just rare use  Razor Pam rare use has some left in the bottle  Taking vitamins and iron without problems  Using his CPAP faithfully no issues there  Patient also with small wound on posterior left hand occurred last week patient works in [employer] needs tetanus shot Relevant past medical, surgical, family and social history reviewed and updated as indicated. Interim medical history since our last visit reviewed. Allergies and medications reviewed and updated.  Review of Systems  Constitutional: Negative.   HENT: Negative.   Eyes: Negative.   Respiratory: Negative.   Cardiovascular: Negative.   Gastrointestinal: Negative.   Endocrine: Negative.   Genitourinary: Negative.   Musculoskeletal: Negative.   Skin: Negative.   Allergic/Immunologic: Negative.   Neurological: Negative.   Hematological: Negative.   Psychiatric/Behavioral: Negative.     Per HPI unless specifically indicated above     Objective:    BP 130/75 mmHg  Pulse 69  Temp(Src) 97.8 F (36.6 C)  Ht 5' 8.2" (1.732 m)  Wt 214 lb (97.07 kg)  BMI 32.36 kg/m2  SpO2 97%  Wt Readings from Last 3 Encounters:  02/23/15 214 lb (97.07 kg)  10/10/14 209 lb 3.2 oz (94.892 kg)  09/28/14 205 lb 7.5 oz (93.2 kg)    Physical Exam  Constitutional: He is oriented to person, place, and time. He appears well-developed and well-nourished.  HENT:  Head: Normocephalic and atraumatic.  Right Ear: External ear  normal.  Left Ear: External ear normal.  Eyes: Conjunctivae and EOM are normal. Pupils are equal, round, and reactive to light.  Neck: Normal range of motion. Neck supple.  Cardiovascular: Normal rate, regular rhythm, normal heart sounds and intact distal pulses.   Pulmonary/Chest: Effort normal and breath sounds normal.  Abdominal: Soft. Bowel sounds are normal. There is no splenomegaly or hepatomegaly.  Genitourinary:  Done at urology  Musculoskeletal: Normal range of motion.  Neurological: He is alert and oriented to person, place, and time. He has normal reflexes.  Skin: No rash noted. No erythema.  Psychiatric: He has a normal mood and affect. His behavior is normal. Judgment and thought content normal.    Results for orders placed or performed in visit on 10/10/14  PSA  Result Value Ref Range   Prostate Specific Ag, Serum 2.2 0.0 - 4.0 ng/mL  BLADDER SCAN AMB NON-IMAGING  Result Value Ref Range   Scan Result 40       Assessment & Plan:   Problem List Items Addressed This Visit      Cardiovascular and Mediastinum   Hypertension    The current medical regimen is effective;  continue present plan and medications.        Relevant Orders   Comprehensive metabolic panel   Lipid panel   Urinalysis, Routine w reflex microscopic (not at [facility])   TSH     Respiratory  OSA on CPAP    The current medical regimen is effective;  continue present plan and medications.       COPD (chronic obstructive pulmonary disease) (HCC)    The current medical regimen is effective;  continue present plan and medications.       Relevant Medications   albuterol (PROAIR HFA) 108 (90 Base) MCG/ACT inhaler   Other Relevant Orders   Lipid panel   Urinalysis, Routine w reflex microscopic (not at [facility])   TSH     Digestive   Chronic GI bleeding    Has not noticed any problems is taking iron without problems and Prilosec without problems      Relevant Orders   Comprehensive metabolic  panel   Lipid panel   CBC with Differential/Platelet   Urinalysis, Routine w reflex microscopic (not at [facility])   TSH     Other   Prostate nodule    Followed at urology      Relevant Orders   Lipid panel   CBC with Differential/Platelet   PSA   History of open hand wound    Healing well needs tetanus shot      Acute anxiety    occ anxiety spells does well with rare lorezapam use      Relevant Medications   LORazepam (ATIVAN) 1 MG tablet    Other Visit Diagnoses    Routine screening for STI (sexually transmitted infection)    -  Primary    Relevant Orders    Hepatitis C Antibody    PE (physical exam), annual            Follow up plan: Return in about 6 months (around 08/23/2015), or if symptoms worsen or fail to improve, for BMP.

## 2015-02-23 NOTE — Assessment & Plan Note (Signed)
Followed at urology

## 2015-02-23 NOTE — Assessment & Plan Note (Signed)
The current medical regimen is effective;  continue present plan and medications.  

## 2015-02-23 NOTE — Assessment & Plan Note (Signed)
Healing well needs tetanus shot

## 2015-02-24 ENCOUNTER — Encounter: Payer: Self-pay | Admitting: Family Medicine

## 2015-02-24 LAB — LIPID PANEL
CHOLESTEROL TOTAL: 204 mg/dL — AB (ref 100–199)
Chol/HDL Ratio: 3.5 ratio units (ref 0.0–5.0)
HDL: 58 mg/dL (ref >39)
LDL CALC: 128 mg/dL — AB (ref 0–99)
TRIGLYCERIDES: 90 mg/dL (ref 0–149)
VLDL Cholesterol Cal: 18 mg/dL (ref 5–40)

## 2015-02-24 LAB — CBC WITH DIFFERENTIAL/PLATELET
BASOS: 0 %
Basophils Absolute: 0 10*3/uL (ref 0.0–0.2)
EOS (ABSOLUTE): 0.3 10*3/uL (ref 0.0–0.4)
Eos: 5 %
HEMOGLOBIN: 16.1 g/dL (ref 12.6–17.7)
Hematocrit: 46.2 % (ref 37.5–51.0)
IMMATURE GRANS (ABS): 0 10*3/uL (ref 0.0–0.1)
Immature Granulocytes: 0 %
LYMPHS ABS: 1.2 10*3/uL (ref 0.7–3.1)
LYMPHS: 23 %
MCH: 31.8 pg (ref 26.6–33.0)
MCHC: 34.8 g/dL (ref 31.5–35.7)
MCV: 91 fL (ref 79–97)
MONOCYTES: 11 %
Monocytes Absolute: 0.6 10*3/uL (ref 0.1–0.9)
NEUTROS ABS: 3.1 10*3/uL (ref 1.4–7.0)
Neutrophils: 61 %
Platelets: 243 10*3/uL (ref 150–379)
RBC: 5.06 x10E6/uL (ref 4.14–5.80)
RDW: 13.1 % (ref 12.3–15.4)
WBC: 5.2 10*3/uL (ref 3.4–10.8)

## 2015-02-24 LAB — COMPREHENSIVE METABOLIC PANEL
ALK PHOS: 69 IU/L (ref 39–117)
ALT: 25 IU/L (ref 0–44)
AST: 22 IU/L (ref 0–40)
Albumin/Globulin Ratio: 1.7 (ref 1.1–2.5)
Albumin: 4.5 g/dL (ref 3.5–4.8)
BUN/Creatinine Ratio: 8 — ABNORMAL LOW (ref 10–22)
BUN: 9 mg/dL (ref 8–27)
Bilirubin Total: 0.7 mg/dL (ref 0.0–1.2)
CALCIUM: 9.5 mg/dL (ref 8.6–10.2)
CO2: 24 mmol/L (ref 18–29)
CREATININE: 1.07 mg/dL (ref 0.76–1.27)
Chloride: 97 mmol/L (ref 96–106)
GFR calc Af Amer: 80 mL/min/{1.73_m2} (ref >59)
GFR calc non Af Amer: 69 mL/min/{1.73_m2} (ref >59)
GLOBULIN, TOTAL: 2.6 g/dL (ref 1.5–4.5)
GLUCOSE: 105 mg/dL — AB (ref 65–99)
POTASSIUM: 4.7 mmol/L (ref 3.5–5.2)
SODIUM: 137 mmol/L (ref 134–144)
Total Protein: 7.1 g/dL (ref 6.0–8.5)

## 2015-02-24 LAB — PSA: Prostate Specific Ag, Serum: 2.8 ng/mL (ref 0.0–4.0)

## 2015-02-24 LAB — HEPATITIS C ANTIBODY: Hep C Virus Ab: 0.1 s/co ratio (ref 0.0–0.9)

## 2015-02-24 LAB — TSH: TSH: 3.21 u[IU]/mL (ref 0.450–4.500)

## 2015-03-07 DIAGNOSIS — D509 Iron deficiency anemia, unspecified: Secondary | ICD-10-CM | POA: Diagnosis not present

## 2015-03-07 DIAGNOSIS — K219 Gastro-esophageal reflux disease without esophagitis: Secondary | ICD-10-CM | POA: Diagnosis not present

## 2015-04-06 ENCOUNTER — Other Ambulatory Visit: Payer: Self-pay

## 2015-04-06 DIAGNOSIS — R972 Elevated prostate specific antigen [PSA]: Secondary | ICD-10-CM

## 2015-04-07 ENCOUNTER — Other Ambulatory Visit: Payer: Medicare Other

## 2015-04-07 DIAGNOSIS — R972 Elevated prostate specific antigen [PSA]: Secondary | ICD-10-CM | POA: Diagnosis not present

## 2015-04-08 LAB — PSA: PROSTATE SPECIFIC AG, SERUM: 2.2 ng/mL (ref 0.0–4.0)

## 2015-04-12 ENCOUNTER — Encounter: Payer: Self-pay | Admitting: Urology

## 2015-04-12 ENCOUNTER — Ambulatory Visit (INDEPENDENT_AMBULATORY_CARE_PROVIDER_SITE_OTHER): Payer: Medicare Other | Admitting: Urology

## 2015-04-12 VITALS — BP 143/70 | HR 63 | Ht 70.0 in | Wt 219.2 lb

## 2015-04-12 DIAGNOSIS — N400 Enlarged prostate without lower urinary tract symptoms: Secondary | ICD-10-CM

## 2015-04-12 DIAGNOSIS — N402 Nodular prostate without lower urinary tract symptoms: Secondary | ICD-10-CM | POA: Diagnosis not present

## 2015-04-12 DIAGNOSIS — N401 Enlarged prostate with lower urinary tract symptoms: Secondary | ICD-10-CM

## 2015-04-12 DIAGNOSIS — N138 Other obstructive and reflux uropathy: Secondary | ICD-10-CM

## 2015-04-12 LAB — BLADDER SCAN AMB NON-IMAGING: Scan Result: 170

## 2015-04-12 NOTE — Progress Notes (Signed)
9:39 AM   Jonathan Burns 1945/11/30 [id number]  Referring provider: Guadalupe Maple, MD [address] [place], [place] [address]  Chief Complaint  Patient presents with  . Benign Prostatic Hypertrophy    6 month follow up     HPI: Patient is a 70 year old Caucasian male with a history of a prostate nodule and BPH with LUTS who presents today for 6 month follow-up.  Prostate nodule Jonathan Burns is a 70 year old white male who was originally referred to [place] for a possible prostate nodule found by his primary care physician.  It has not been appreciated on subsequent exams.  He does not have a family history of prostate cancer.  His most recent PSA was 2.2 ng/mL on 04/07/2015  BPH with LUTS His IPSS score today is 1, which is mild lower urinary tract symptomatology. He is pleased with his quality life due to his urinary symptoms. His PVR is 170 mL. His previous  PVR is 40 mL.  Patient states he is doing well and without complaints.   He denies any dysuria, hematuria or suprapubic pain.  His has not seen an urologist in the past.  He also denies any recent fevers, chills, nausea or vomiting.  He does not have a family history of PCa.      IPSS      04/12/15 0900       International Prostate Symptom Score   How often have you had the sensation of not emptying your bladder? Not at All     How often have you had to urinate less than every two hours? Not at All     How often have you found you stopped and started again several times when you urinated? Not at All     How often have you found it difficult to postpone urination? Not at All     How often have you had a weak urinary stream? Not at All     How often have you had to strain to start urination? Not at All     How many times did you typically get up at night to urinate? 1 Time     Total IPSS Score 1     Quality of Life due to urinary symptoms   If you were to spend the rest of your life with your urinary condition just the way it is  now how would you feel about that? Pleased        Score:  1-7 Mild 8-19 Moderate 20-35 Severe  PMH: Past Medical History  Diagnosis Date  . Hyperlipidemia   . Hypertension   . COPD (chronic obstructive pulmonary disease) ([place])   . Anemia   . PUD (peptic ulcer disease)   . Chronic GI bleeding   . Prostate nodule   . Rising PSA level   . BPH (benign prostatic hypertrophy)     Surgical History: Past Surgical History  Procedure Laterality Date  . Colonoscopy  2015  . Esophagogastroduodenoscopy  2015    Home Medications:    Medication List       This list is accurate as of: 04/12/15  9:39 AM.  Always use your most recent med list.               amLODipine 10 MG tablet  Commonly known as:  NORVASC  Take 1 tablet (10 mg total) by mouth daily.     B-12 2500 MCG Tabs  Take by mouth.  carvedilol 25 MG tablet  Commonly known as:  COREG  Take 1 tablet (25 mg total) by mouth 2 (two) times daily with a meal.     Fish Oil 1000 MG Caps  Take by mouth.     Iron 325 (65 Fe) MG Tabs  Take 1 tablet by mouth daily.     LORazepam 1 MG tablet  Commonly known as:  ATIVAN  Take 1 tablet (1 mg total) by mouth daily as needed for anxiety.     losartan 100 MG tablet  Commonly known as:  COZAAR  Take 1 tablet (100 mg total) by mouth daily.     MENS MULTIVITAMIN PLUS PO  Take by mouth daily.     pantoprazole 40 MG tablet  Commonly known as:  PROTONIX  Take 1 tablet (40 mg total) by mouth daily.     PROAIR HFA 108 (90 Base) MCG/ACT inhaler  Generic drug:  albuterol  Inhale into the lungs. Reported on 04/12/2015        Allergies: No Known Allergies  Family History: Family History  Problem Relation Age of Onset  . Cancer Mother     breast  . Diabetes Mother   . Aneurysm Father   . Kidney disease Neg Hx   . Prostate cancer Neg Hx     Social History:  reports that he quit smoking about 28 years ago. His smoking use included Cigarettes. He has a 30 pack-year  smoking history. He has never used smokeless tobacco. He reports that he drinks alcohol. He reports that he does not use illicit drugs.  ROS: UROLOGY Frequent Urination?: No Hard to postpone urination?: No Burning/pain with urination?: No Get up at night to urinate?: No Leakage of urine?: No Urine stream starts and stops?: No Trouble starting stream?: No Do you have to strain to urinate?: No Blood in urine?: No Urinary tract infection?: No Sexually transmitted disease?: No Injury to kidneys or bladder?: No Painful intercourse?: No Weak stream?: No Erection problems?: No Penile pain?: No  Gastrointestinal Nausea?: No Vomiting?: No Indigestion/heartburn?: No Diarrhea?: No Constipation?: No  Constitutional Fever: No Night sweats?: No Weight loss?: No Fatigue?: No  Skin Skin rash/lesions?: No Itching?: No  Eyes Blurred vision?: No Double vision?: No  Ears/Nose/Throat Sore throat?: No Sinus problems?: No  Hematologic/Lymphatic Swollen glands?: No Easy bruising?: No  Cardiovascular Leg swelling?: No Chest pain?: No  Respiratory Cough?: No Shortness of breath?: No  Endocrine Excessive thirst?: No  Musculoskeletal Back pain?: No Joint pain?: No  Neurological Headaches?: No Dizziness?: No  Psychologic Depression?: No Anxiety?: No  Physical Exam: BP 143/70 mmHg  Pulse 63  Ht '5\' 10"'$  (1.778 m)  Wt 219 lb 3.2 oz (99.428 kg)  BMI 31.45 kg/m2  GU: Patient with circumcised phallus.  Urethral meatus is patent.  No penile discharge. No penile lesions or rashes. Scrotum without lesions, cysts, rashes and/or edema.  Testicles are located scrotally bilaterally. No masses are appreciated in the testicles. Left and right epididymis are normal. Rectal: Patient with  normal sphincter tone. Perineum without scarring or rashes. No rectal masses are appreciated. Prostate is approximately 60 grams, no nodules are appreciated. Seminal vesicles are  normal.   Laboratory Data: Lab Results  Component Value Date   WBC 5.2 02/23/2015   HGB 12.5* 09/28/2014   HCT 46.2 02/23/2015   MCV 91 02/23/2015   PLT 243 02/23/2015    Lab Results  Component Value Date   CREATININE 1.07 02/23/2015   PSA History  2.2 ng/mL on 04/07/2015  2.8 ng/mL on 02/23/2015  2.2 ng/mL on 10/10/2014  3.2 ng/mL on 12/15  2.3 ng/mLon 12/14  2.0 ng/mL on 12/13   2.3 ng/mL on 11/12             1.03 ng/mL on 11/10   Pertinent Imaging: Results for orders placed or performed in visit on 04/12/15  BLADDER SCAN AMB NON-IMAGING  Result Value Ref Range   Scan Result 170     Assessment & Plan:    1. Prostate nodule:    Patient has a history of a prostate nodule found on exam by his PCP.  It has not been appreciated through our exams.  We will continue to monitor with DRE's and PSA's and patient will RTC in 12 months.     2. Rising PSA:    Patient and Dr. Erlene Quan discussed the implications of PSA screening at his last visit 6 months ago.  Patient's PSA has been rising from 1.03 in 2010 to 3.2 in 12/15.   This velocity is less than 0.75ng/mL a year.  His PSA has been stable from his last several months. He will return to clinic in 12 months for PSA and exam.   3. BPH with LUTS:    IPSS 1/1.  PVR is 170 mL.  Patient is pleased with his urinary symptoms at this time.  We will continue to monitor with IPSS score, exam and PSA in 12 months.    - BLADDER SCAN AMB NON-IMAGING   Return in about 1 year (around 04/11/2016) for IPSS score and exam.  Zara Council, [facility]  [facility] [address], [place], [place] [address] [phone]

## 2015-04-15 DIAGNOSIS — N401 Enlarged prostate with lower urinary tract symptoms: Principal | ICD-10-CM

## 2015-04-15 DIAGNOSIS — N138 Other obstructive and reflux uropathy: Secondary | ICD-10-CM | POA: Insufficient documentation

## 2015-04-27 ENCOUNTER — Encounter: Payer: Self-pay | Admitting: *Deleted

## 2015-05-01 DIAGNOSIS — I10 Essential (primary) hypertension: Secondary | ICD-10-CM | POA: Diagnosis not present

## 2015-05-01 DIAGNOSIS — B0052 Herpesviral keratitis: Secondary | ICD-10-CM | POA: Diagnosis not present

## 2015-06-16 DIAGNOSIS — E669 Obesity, unspecified: Secondary | ICD-10-CM | POA: Diagnosis not present

## 2015-06-16 DIAGNOSIS — G4733 Obstructive sleep apnea (adult) (pediatric): Secondary | ICD-10-CM | POA: Diagnosis not present

## 2015-07-31 DIAGNOSIS — B0052 Herpesviral keratitis: Secondary | ICD-10-CM | POA: Diagnosis not present

## 2015-07-31 DIAGNOSIS — I10 Essential (primary) hypertension: Secondary | ICD-10-CM | POA: Diagnosis not present

## 2015-09-20 ENCOUNTER — Encounter: Payer: Self-pay | Admitting: Family Medicine

## 2015-09-20 ENCOUNTER — Ambulatory Visit (INDEPENDENT_AMBULATORY_CARE_PROVIDER_SITE_OTHER): Payer: Medicare Other | Admitting: Family Medicine

## 2015-09-20 VITALS — BP 129/75 | HR 61 | Temp 97.5°F | Ht 67.6 in | Wt 218.0 lb

## 2015-09-20 DIAGNOSIS — Z23 Encounter for immunization: Secondary | ICD-10-CM

## 2015-09-20 DIAGNOSIS — I10 Essential (primary) hypertension: Secondary | ICD-10-CM | POA: Diagnosis not present

## 2015-09-20 NOTE — Patient Instructions (Addendum)
Pneumococcal Polysaccharide Vaccine: What You Need to Know 1. Why get vaccinated? Vaccination can protect older adults (and some children and younger adults) from pneumococcal disease. Pneumococcal disease is caused by bacteria that can spread from person to person through close contact. It can cause ear infections, and it can also lead to more serious infections of the:   Lungs (pneumonia),  Blood (bacteremia), and  Covering of the brain and spinal cord (meningitis). Meningitis can cause deafness and brain damage, and it can be fatal. Anyone can get pneumococcal disease, but children under 62 years of age, people with certain medical conditions, adults over 68 years of age, and cigarette smokers are at the highest risk. About 18,000 older adults die each year from pneumococcal disease in the [place]. Treatment of pneumococcal infections with penicillin and other drugs used to be more effective. But some strains of the disease have become resistant to these drugs. This makes prevention of the disease, through vaccination, even more important. 2. Pneumococcal polysaccharide vaccine (PPSV23) Pneumococcal polysaccharide vaccine (PPSV23) protects against 23 types of pneumococcal bacteria. It will not prevent all pneumococcal disease. PPSV23 is recommended for:  All adults 6 years of age and older,  Anyone 2 through 72 years of age with certain long-term health problems,  Anyone 2 through 72 years of age with a weakened immune system,  Adults 64 through 72 years of age who smoke cigarettes or have asthma. Most people need only one dose of PPSV. A second dose is recommended for certain high-risk groups. People 53 and older should get a dose even if they have gotten one or more doses of the vaccine before they turned 65. Your healthcare provider can give you more information about these recommendations. Most healthy adults develop protection within 2 to 3 weeks of getting the shot. 3. Some  people should not get this vaccine  Anyone who has had a life-threatening allergic reaction to PPSV should not get another dose.  Anyone who has a severe allergy to any component of PPSV should not receive it. Tell your provider if you have any severe allergies.  Anyone who is moderately or severely ill when the shot is scheduled may be asked to wait until they recover before getting the vaccine. Someone with a mild illness can usually be vaccinated.  Children less than 83 years of age should not receive this vaccine.  There is no evidence that PPSV is harmful to either a pregnant woman or to her fetus. However, as a precaution, women who need the vaccine should be vaccinated before becoming pregnant, if possible. 4. Risks of a vaccine reaction With any medicine, including vaccines, there is a chance of side effects. These are usually mild and go away on their own, but serious reactions are also possible. About half of people who get PPSV have mild side effects, such as redness or pain where the shot is given, which go away within about two days. Less than 1 out of 100 people develop a fever, muscle aches, or more severe local reactions. Problems that could happen after any vaccine:  People sometimes faint after a medical procedure, including vaccination. Sitting or lying down for about 15 minutes can help prevent fainting, and injuries caused by a fall. Tell your doctor if you feel dizzy, or have vision changes or ringing in the ears.  Some people get severe pain in the shoulder and have difficulty moving the arm where a shot was given. This happens very rarely.  Any medication  can cause a severe allergic reaction. Such reactions from a vaccine are very rare, estimated at about 1 in a million doses, and would happen within a few minutes to a few hours after the vaccination. As with any medicine, there is a very remote chance of a vaccine causing a serious injury or death. The safety of  vaccines is always being monitored. For more information, visit: http://www.aguilar.org/ 5. What if there is a serious reaction? What should I look for? Look for anything that concerns you, such as signs of a severe allergic reaction, very high fever, or unusual behavior.  Signs of a severe allergic reaction can include hives, swelling of the face and throat, difficulty breathing, a fast heartbeat, dizziness, and weakness. These would usually start a few minutes to a few hours after the vaccination. What should I do? If you think it is a severe allergic reaction or other emergency that can't wait, call 9-1-1 or get to the nearest hospital. Otherwise, call your doctor. Afterward, the reaction should be reported to the Vaccine Adverse Event Reporting System (VAERS). Your doctor might file this report, or you can do it yourself through the VAERS web site at www.vaers.SamedayNews.es, or by calling (808)472-1290.  VAERS does not give medical advice. 6. How can I learn more?  Ask your doctor. He or she can give you the vaccine package insert or suggest other sources of information.  Call your local or state health department.  Contact the Centers for Disease Control and Prevention (CDC):  Call 919-445-4146 (1-800-CDC-INFO) or  Visit CDC's website at http://hunter.com/ CDC Pneumococcal Polysaccharide Vaccine VIS (06/11/13)   This information is not intended to replace advice given to you by your health care provider. Make sure you discuss any questions you have with your health care provider.   Document Released: 12/02/2005 Document Revised: 02/25/2014 Document Reviewed: 06/14/2013 Elsevier Interactive Patient Education 2016 [employer]. Tdap Vaccine (Tetanus, Diphtheria and Pertussis): What You Need to Know 1. Why get vaccinated? Tetanus, diphtheria and pertussis are very serious diseases. Tdap vaccine can protect [place] from these diseases. And, Tdap vaccine given to pregnant women can protect  newborn babies against pertussis. TETANUS (Lockjaw) is rare in the [place] today. It causes painful muscle tightening and stiffness, usually all over the body.  It can lead to tightening of muscles in the head and neck so you can't open your mouth, swallow, or sometimes even breathe. Tetanus kills about 1 out of 10 people who are infected even after receiving the best medical care. DIPHTHERIA is also rare in the [place] today. It can cause a thick coating to form in the back of the throat.  It can lead to breathing problems, heart failure, paralysis, and death. PERTUSSIS (Whooping Cough) causes severe coughing spells, which can cause difficulty breathing, vomiting and disturbed sleep.  It can also lead to weight loss, incontinence, and rib fractures. Up to 2 in 100 adolescents and 5 in 100 adults with pertussis are hospitalized or have complications, which could include pneumonia or death. These diseases are caused by bacteria. Diphtheria and pertussis are spread from person to person through secretions from coughing or sneezing. Tetanus enters the body through cuts, scratches, or wounds. Before vaccines, as many as 200,000 cases of diphtheria, 200,000 cases of pertussis, and hundreds of cases of tetanus, were reported in the [place] each year. Since vaccination began, reports of cases for tetanus and diphtheria have dropped by about 99% and for pertussis by about 80%. 2. Tdap  vaccine Tdap vaccine can protect adolescents and adults from tetanus, diphtheria, and pertussis. One dose of Tdap is routinely given at age 53 or 72. People who did not get Tdap at that age should get it as soon as possible. Tdap is especially important for healthcare professionals and anyone having close contact with a baby younger than 12 months. Pregnant women should get a dose of Tdap during every pregnancy, to protect the newborn from pertussis. Infants are most at risk for severe, life-threatening  complications from pertussis. Another vaccine, called Td, protects against tetanus and diphtheria, but not pertussis. A Td booster should be given every 10 years. Tdap may be given as one of these boosters if you have never gotten Tdap before. Tdap may also be given after a severe cut or burn to prevent tetanus infection. Your doctor or the person giving you the vaccine can give you more information. Tdap may safely be given at the same time as other vaccines. 3. Some people should not get this vaccine  A person who has ever had a life-threatening allergic reaction after a previous dose of any diphtheria, tetanus or pertussis containing vaccine, OR has a severe allergy to any part of this vaccine, should not get Tdap vaccine. Tell the person giving the vaccine about any severe allergies.  Anyone who had coma or long repeated seizures within 7 days after a childhood dose of DTP or DTaP, or a previous dose of Tdap, should not get Tdap, unless a cause other than the vaccine was found. They can still get Td.  Talk to your doctor if you:  have seizures or another nervous system problem,  had severe pain or swelling after any vaccine containing diphtheria, tetanus or pertussis,  ever had a condition called Guillain-Barr Syndrome (GBS),  aren't feeling well on the day the shot is scheduled. 4. Risks With any medicine, including vaccines, there is a chance of side effects. These are usually mild and go away on their own. Serious reactions are also possible but are rare. Most people who get Tdap vaccine do not have any problems with it. Mild problems following Tdap (Did not interfere with activities)  Pain where the shot was given (about 3 in 4 adolescents or 2 in 3 adults)  Redness or swelling where the shot was given (about 1 person in 5)  Mild fever of at least 100.25F (up to about 1 in 25 adolescents or 1 in 100 adults)  Headache (about 3 or 4 people in 10)  Tiredness (about 1 person in  3 or 4)  Nausea, vomiting, diarrhea, stomach ache (up to 1 in 4 adolescents or 1 in 10 adults)  Chills, sore joints (about 1 person in 10)  Body aches (about 1 person in 3 or 4)  Rash, swollen glands (uncommon) Moderate problems following Tdap (Interfered with activities, but did not require medical attention)  Pain where the shot was given (up to 1 in 5 or 6)  Redness or swelling where the shot was given (up to about 1 in 16 adolescents or 1 in 12 adults)  Fever over 102F (about 1 in 100 adolescents or 1 in 250 adults)  Headache (about 1 in 7 adolescents or 1 in 10 adults)  Nausea, vomiting, diarrhea, stomach ache (up to 1 or 3 people in 100)  Swelling of the entire arm where the shot was given (up to about 1 in 500). Severe problems following Tdap (Unable to perform usual activities; required medical attention)  Swelling,  severe pain, bleeding and redness in the arm where the shot was given (rare). Problems that could happen after any vaccine:  People sometimes faint after a medical procedure, including vaccination. Sitting or lying down for about 15 minutes can help prevent fainting, and injuries caused by a fall. Tell your doctor if you feel dizzy, or have vision changes or ringing in the ears.  Some people get severe pain in the shoulder and have difficulty moving the arm where a shot was given. This happens very rarely.  Any medication can cause a severe allergic reaction. Such reactions from a vaccine are very rare, estimated at fewer than 1 in a million doses, and would happen within a few minutes to a few hours after the vaccination. As with any medicine, there is a very remote chance of a vaccine causing a serious injury or death. The safety of vaccines is always being monitored. For more information, visit: http://www.aguilar.org/ 5. What if there is a serious problem? What should I look for?  Look for anything that concerns you, such as signs of a severe  allergic reaction, very high fever, or unusual behavior.  Signs of a severe allergic reaction can include hives, swelling of the face and throat, difficulty breathing, a fast heartbeat, dizziness, and weakness. These would usually start a few minutes to a few hours after the vaccination. What should I do?  If you think it is a severe allergic reaction or other emergency that can't wait, call 9-1-1 or get the person to the nearest hospital. Otherwise, call your doctor.  Afterward, the reaction should be reported to the Vaccine Adverse Event Reporting System (VAERS). Your doctor might file this report, or you can do it yourself through the VAERS web site at www.vaers.SamedayNews.es, or by calling 320-365-7302. VAERS does not give medical advice.  6. The National Vaccine Injury Compensation Program The [employer] Vaccine Injury Compensation Program (VICP) is a federal program that was created to compensate people who may have been injured by certain vaccines. Persons who believe they may have been injured by a vaccine can learn about the program and about filing a claim by calling 505-836-1513 or visiting the [place] website at GoldCloset.com.ee. There is a time limit to file a claim for compensation. 7. How can I learn more?  Ask your doctor. He or she can give you the vaccine package insert or suggest other sources of information.  Call your local or state health department.  Contact the Centers for Disease Control and Prevention (CDC):  Call (778)795-5881 (1-800-CDC-INFO) or  Visit CDC's website at http://hunter.com/ CDC Tdap Vaccine VIS (04/13/13)   This information is not intended to replace advice given to you by your health care provider. Make sure you discuss any questions you have with your health care provider.   Document Released: 08/06/2011 Document Revised: 02/25/2014 Document Reviewed: 05/19/2013 Elsevier Interactive Patient Education [employer].

## 2015-09-20 NOTE — Progress Notes (Signed)
   BP 129/75 (BP Location: Left Arm, Patient Position: Sitting, Cuff Size: Normal)   Pulse 61   Temp 97.5 F (36.4 C)   Ht 5' 7.6" (1.717 m)   Wt 218 lb (98.9 kg)   SpO2 99%   BMI 33.54 kg/m    Subjective:    Patient ID: Jonathan Burns, male    DOB: Jun 29, 1945, 70 y.o.   MRN: [id number]  HPI: Jonathan Burns is a 70 y.o. male  Chief Complaint  Patient presents with  . Hypertension  Recheck hypertension doing well home blood pressure monitoring and blood pressure here doing good no side effects from medications taken faithfully. Reflux no issues well controlled with Protonix has especially been helped by using his CPAP and sleeps well. Good report from urology with graduated to yearly exams  Relevant past medical, surgical, family and social history reviewed and updated as indicated. Interim medical history since our last visit reviewed. Allergies and medications reviewed and updated.  Review of Systems  Constitutional: Negative.   Respiratory: Negative.   Cardiovascular: Negative.   Gastrointestinal: Negative.     Per HPI unless specifically indicated above     Objective:    BP 129/75 (BP Location: Left Arm, Patient Position: Sitting, Cuff Size: Normal)   Pulse 61   Temp 97.5 F (36.4 C)   Ht 5' 7.6" (1.717 m)   Wt 218 lb (98.9 kg)   SpO2 99%   BMI 33.54 kg/m   Wt Readings from Last 3 Encounters:  09/20/15 218 lb (98.9 kg)  04/12/15 219 lb 3.2 oz (99.4 kg)  02/23/15 214 lb (97.1 kg)    Physical Exam  Constitutional: He is oriented to person, place, and time. He appears well-developed and well-nourished. No distress.  HENT:  Head: Normocephalic and atraumatic.  Right Ear: Hearing normal.  Left Ear: Hearing normal.  Nose: Nose normal.  Eyes: Conjunctivae and lids are normal. Right eye exhibits no discharge. Left eye exhibits no discharge. No scleral icterus.  Cardiovascular: Normal rate, regular rhythm and normal heart sounds.   Pulmonary/Chest: Effort normal and  breath sounds normal. No respiratory distress.  Abdominal: Soft. Bowel sounds are normal.  Musculoskeletal: Normal range of motion.  Neurological: He is alert and oriented to person, place, and time.  Skin: Skin is intact. No rash noted.  Psychiatric: He has a normal mood and affect. His speech is normal and behavior is normal. Judgment and thought content normal. Cognition and memory are normal.    Results for orders placed or performed in visit on 04/12/15  BLADDER SCAN AMB NON-IMAGING  Result Value Ref Range   Scan Result 170       Assessment & Plan:   Problem List Items Addressed This Visit      Cardiovascular and Mediastinum   Hypertension - Primary   Relevant Orders   Basic metabolic panel    Other Visit Diagnoses    Need for Tdap vaccination       Relevant Orders   Tdap vaccine greater than or equal to 7yo IM (Completed)   Need for pneumococcal vaccination       Relevant Orders   Pneumococcal polysaccharide vaccine 23-valent greater than or equal to 2yo subcutaneous/IM (Completed)       Follow up plan: Return if symptoms worsen or fail to improve, for Physical Exam.

## 2015-09-21 ENCOUNTER — Encounter: Payer: Self-pay | Admitting: Family Medicine

## 2015-09-21 LAB — BASIC METABOLIC PANEL
BUN/Creatinine Ratio: 12 (ref 10–24)
BUN: 12 mg/dL (ref 8–27)
CALCIUM: 9.4 mg/dL (ref 8.6–10.2)
CHLORIDE: 99 mmol/L (ref 96–106)
CO2: 25 mmol/L (ref 18–29)
Creatinine, Ser: 0.99 mg/dL (ref 0.76–1.27)
GFR calc non Af Amer: 76 mL/min/{1.73_m2} (ref >59)
GFR, EST AFRICAN AMERICAN: 88 mL/min/{1.73_m2} (ref >59)
GLUCOSE: 101 mg/dL — AB (ref 65–99)
POTASSIUM: 4.6 mmol/L (ref 3.5–5.2)
Sodium: 138 mmol/L (ref 134–144)

## 2015-10-25 DIAGNOSIS — Z1283 Encounter for screening for malignant neoplasm of skin: Secondary | ICD-10-CM | POA: Diagnosis not present

## 2015-10-25 DIAGNOSIS — Z872 Personal history of diseases of the skin and subcutaneous tissue: Secondary | ICD-10-CM | POA: Diagnosis not present

## 2015-10-25 DIAGNOSIS — L821 Other seborrheic keratosis: Secondary | ICD-10-CM | POA: Diagnosis not present

## 2015-10-25 DIAGNOSIS — L570 Actinic keratosis: Secondary | ICD-10-CM | POA: Diagnosis not present

## 2015-10-30 ENCOUNTER — Other Ambulatory Visit: Payer: Self-pay | Admitting: Family Medicine

## 2015-10-30 DIAGNOSIS — I10 Essential (primary) hypertension: Secondary | ICD-10-CM

## 2015-12-11 DIAGNOSIS — Z23 Encounter for immunization: Secondary | ICD-10-CM | POA: Diagnosis not present

## 2016-01-22 ENCOUNTER — Other Ambulatory Visit: Payer: Self-pay | Admitting: Family Medicine

## 2016-01-22 DIAGNOSIS — I10 Essential (primary) hypertension: Secondary | ICD-10-CM

## 2016-01-22 DIAGNOSIS — K922 Gastrointestinal hemorrhage, unspecified: Secondary | ICD-10-CM

## 2016-01-22 NOTE — Telephone Encounter (Signed)
Routing to provider, appt on 03/25/16.

## 2016-03-25 ENCOUNTER — Encounter: Payer: Self-pay | Admitting: Family Medicine

## 2016-03-25 ENCOUNTER — Ambulatory Visit (INDEPENDENT_AMBULATORY_CARE_PROVIDER_SITE_OTHER): Payer: Medicare Other | Admitting: Family Medicine

## 2016-03-25 VITALS — BP 148/84 | HR 66 | Temp 97.8°F | Ht 68.5 in | Wt 220.0 lb

## 2016-03-25 DIAGNOSIS — Z9989 Dependence on other enabling machines and devices: Secondary | ICD-10-CM

## 2016-03-25 DIAGNOSIS — Z0000 Encounter for general adult medical examination without abnormal findings: Secondary | ICD-10-CM

## 2016-03-25 DIAGNOSIS — G4733 Obstructive sleep apnea (adult) (pediatric): Secondary | ICD-10-CM | POA: Diagnosis not present

## 2016-03-25 DIAGNOSIS — R972 Elevated prostate specific antigen [PSA]: Secondary | ICD-10-CM | POA: Diagnosis not present

## 2016-03-25 DIAGNOSIS — D500 Iron deficiency anemia secondary to blood loss (chronic): Secondary | ICD-10-CM

## 2016-03-25 DIAGNOSIS — K922 Gastrointestinal hemorrhage, unspecified: Secondary | ICD-10-CM

## 2016-03-25 DIAGNOSIS — N401 Enlarged prostate with lower urinary tract symptoms: Secondary | ICD-10-CM | POA: Diagnosis not present

## 2016-03-25 DIAGNOSIS — I10 Essential (primary) hypertension: Secondary | ICD-10-CM

## 2016-03-25 DIAGNOSIS — Z1329 Encounter for screening for other suspected endocrine disorder: Secondary | ICD-10-CM

## 2016-03-25 DIAGNOSIS — E785 Hyperlipidemia, unspecified: Secondary | ICD-10-CM

## 2016-03-25 DIAGNOSIS — N138 Other obstructive and reflux uropathy: Secondary | ICD-10-CM | POA: Diagnosis not present

## 2016-03-25 LAB — URINALYSIS, ROUTINE W REFLEX MICROSCOPIC
BILIRUBIN UA: NEGATIVE
Glucose, UA: NEGATIVE
KETONES UA: NEGATIVE
LEUKOCYTES UA: NEGATIVE
Nitrite, UA: NEGATIVE
PH UA: 7 (ref 5.0–7.5)
PROTEIN UA: NEGATIVE
SPEC GRAV UA: 1.015 (ref 1.005–1.030)
UUROB: 0.2 mg/dL (ref 0.2–1.0)

## 2016-03-25 LAB — MICROSCOPIC EXAMINATION

## 2016-03-25 MED ORDER — PANTOPRAZOLE SODIUM 40 MG PO TBEC: 40.0000 mg | DELAYED_RELEASE_TABLET | Freq: Every day | ORAL | 4 refills | Status: DC

## 2016-03-25 MED ORDER — LOSARTAN POTASSIUM 100 MG PO TABS: 100.0000 mg | ORAL_TABLET | Freq: Every day | ORAL | 4 refills | Status: DC

## 2016-03-25 MED ORDER — AMLODIPINE BESYLATE 10 MG PO TABS: 10.0000 mg | ORAL_TABLET | Freq: Every day | ORAL | 4 refills | Status: DC

## 2016-03-25 MED ORDER — CARVEDILOL 25 MG PO TABS: 25.0000 mg | ORAL_TABLET | Freq: Two times a day (BID) | ORAL | 4 refills | Status: DC

## 2016-03-25 NOTE — Assessment & Plan Note (Signed)
The current medical regimen is effective;  continue present plan and medications.  

## 2016-03-25 NOTE — Assessment & Plan Note (Signed)
Discuss elevated blood pressure here but at home doing well will continue to monitor if not doing well will reevaluate.

## 2016-03-25 NOTE — Assessment & Plan Note (Signed)
Followed at urology

## 2016-03-25 NOTE — Progress Notes (Signed)
BP (!) 148/84   Pulse 66   Temp 97.8 F (36.6 C) (Oral)   Ht 5' 8.5" (1.74 m)   Wt 220 lb (99.8 kg)   SpO2 99%   BMI 32.96 kg/m    Subjective:    Patient ID: Jonathan Burns, male    DOB: 29-Mar-1945, 71 y.o.   MRN: [id number]  HPI: Jonathan Burns is a 71 y.o. male  Chief Complaint  Patient presents with  . Annual Exam  Patient follow-up doing well with blood pressure home checks in the 120s over the 60s to 70s. Taking medications faithfully without problems. Sleep apnea doing well with CPAP. Prostate followed by urology. Taking iron without problems for chronic intermittent GI bleeding. Cholesterol no issues  Relevant past medical, surgical, family and social history reviewed and updated as indicated. Interim medical history since our last visit reviewed. Allergies and medications reviewed and updated.  Review of Systems  Constitutional: Negative.   HENT: Negative.   Eyes: Negative.   Respiratory: Negative.   Cardiovascular: Negative.   Gastrointestinal: Negative.   Endocrine: Negative.   Genitourinary: Negative.   Musculoskeletal: Negative.   Skin: Negative.   Allergic/Immunologic: Negative.   Neurological: Negative.   Hematological: Negative.   Psychiatric/Behavioral: Negative.     Per HPI unless specifically indicated above     Objective:    BP (!) 148/84   Pulse 66   Temp 97.8 F (36.6 C) (Oral)   Ht 5' 8.5" (1.74 m)   Wt 220 lb (99.8 kg)   SpO2 99%   BMI 32.96 kg/m   Wt Readings from Last 3 Encounters:  03/25/16 220 lb (99.8 kg)  09/20/15 218 lb (98.9 kg)  04/12/15 219 lb 3.2 oz (99.4 kg)    Physical Exam  Constitutional: He is oriented to person, place, and time. He appears well-developed and well-nourished.  HENT:  Head: Normocephalic.  Right Ear: External ear normal.  Left Ear: External ear normal.  Nose: Nose normal.  Eyes: Conjunctivae and EOM are normal. Pupils are equal, round, and reactive to light.  Neck: Normal range of motion. Neck  supple. No thyromegaly present.  Cardiovascular: Normal rate, regular rhythm, normal heart sounds and intact distal pulses.   Pulmonary/Chest: Effort normal and breath sounds normal.  Abdominal: Soft. Bowel sounds are normal. There is no splenomegaly or hepatomegaly.  Genitourinary: Penis normal.  Genitourinary Comments: GU done at urology  Musculoskeletal: Normal range of motion.  Lymphadenopathy:    He has no cervical adenopathy.  Neurological: He is alert and oriented to person, place, and time. He has normal reflexes.  Skin: Skin is warm and dry.  Psychiatric: He has a normal mood and affect. His behavior is normal. Judgment and thought content normal.     Results for orders placed or performed in visit on 93/90/30  Basic metabolic panel  Result Value Ref Range   Glucose 101 (H) 65 - 99 mg/dL   BUN 12 8 - 27 mg/dL   Creatinine, Ser 0.99 0.76 - 1.27 mg/dL   GFR calc non Af Amer 76 >59 mL/min/1.73   GFR calc Af Amer 88 >59 mL/min/1.73   BUN/Creatinine Ratio 12 10 - 24   Sodium 138 134 - 144 mmol/L   Potassium 4.6 3.5 - 5.2 mmol/L   Chloride 99 96 - 106 mmol/L   CO2 25 18 - 29 mmol/L   Calcium 9.4 8.6 - 10.2 mg/dL      Assessment & Plan:   Problem List Items  Addressed This Visit      Cardiovascular and Mediastinum   Hypertension    Discuss elevated blood pressure here but at home doing well will continue to monitor if not doing well will reevaluate.      Relevant Medications   amLODipine (NORVASC) 10 MG tablet   carvedilol (COREG) 25 MG tablet   losartan (COZAAR) 100 MG tablet   Other Relevant Orders   CBC with Differential/Platelet   Comprehensive metabolic panel   Urinalysis, Routine w reflex microscopic   TSH     Respiratory   OSA on CPAP    The current medical regimen is effective;  continue present plan and medications.       Relevant Orders   TSH     Digestive   Chronic GI bleeding    The current medical regimen is effective;  continue present plan  and medications.       Relevant Medications   pantoprazole (PROTONIX) 40 MG tablet   Other Relevant Orders   TSH     Genitourinary   BPH with obstruction/lower urinary tract symptoms    Followed at urology      Relevant Orders   TSH   PSA     Other   Hyperlipidemia    The current medical regimen is effective;  continue present plan and medications.       Relevant Medications   amLODipine (NORVASC) 10 MG tablet   carvedilol (COREG) 25 MG tablet   losartan (COZAAR) 100 MG tablet   Other Relevant Orders   Lipid panel   TSH   Anemia    The current medical regimen is effective;  continue present plan and medications.       Relevant Orders   TSH   Rising PSA level   Relevant Orders   TSH   PSA    Other Visit Diagnoses    Annual physical exam    -  Primary   Relevant Orders   CBC with Differential/Platelet   Comprehensive metabolic panel   Lipid panel   Urinalysis, Routine w reflex microscopic   TSH   PSA   Thyroid disorder screen       Relevant Orders   TSH       Follow up plan: Return in about 6 months (around 09/22/2016) for BMP.

## 2016-03-26 LAB — CBC WITH DIFFERENTIAL/PLATELET
Basophils Absolute: 0 10*3/uL (ref 0.0–0.2)
Basos: 0 %
EOS (ABSOLUTE): 0.4 10*3/uL (ref 0.0–0.4)
EOS: 7 %
HEMATOCRIT: 47.9 % (ref 37.5–51.0)
Hemoglobin: 16 g/dL (ref 13.0–17.7)
IMMATURE GRANS (ABS): 0 10*3/uL (ref 0.0–0.1)
IMMATURE GRANULOCYTES: 0 %
LYMPHS: 18 %
Lymphocytes Absolute: 1.1 10*3/uL (ref 0.7–3.1)
MCH: 30.8 pg (ref 26.6–33.0)
MCHC: 33.4 g/dL (ref 31.5–35.7)
MCV: 92 fL (ref 79–97)
Monocytes Absolute: 0.8 10*3/uL (ref 0.1–0.9)
Monocytes: 12 %
NEUTROS PCT: 63 %
Neutrophils Absolute: 3.9 10*3/uL (ref 1.4–7.0)
PLATELETS: 183 10*3/uL (ref 150–379)
RBC: 5.19 x10E6/uL (ref 4.14–5.80)
RDW: 13.8 % (ref 12.3–15.4)
WBC: 6.2 10*3/uL (ref 3.4–10.8)

## 2016-03-26 LAB — COMPREHENSIVE METABOLIC PANEL
A/G RATIO: 1.6 (ref 1.2–2.2)
ALT: 28 IU/L (ref 0–44)
AST: 20 IU/L (ref 0–40)
Albumin: 4.5 g/dL (ref 3.5–4.8)
Alkaline Phosphatase: 76 IU/L (ref 39–117)
BUN/Creatinine Ratio: 12 (ref 10–24)
BUN: 11 mg/dL (ref 8–27)
Bilirubin Total: 0.8 mg/dL (ref 0.0–1.2)
CALCIUM: 9.3 mg/dL (ref 8.6–10.2)
CO2: 21 mmol/L (ref 18–29)
CREATININE: 0.95 mg/dL (ref 0.76–1.27)
Chloride: 103 mmol/L (ref 96–106)
GFR, EST AFRICAN AMERICAN: 92 mL/min/{1.73_m2} (ref >59)
GFR, EST NON AFRICAN AMERICAN: 80 mL/min/{1.73_m2} (ref >59)
Globulin, Total: 2.8 g/dL (ref 1.5–4.5)
Glucose: 112 mg/dL — ABNORMAL HIGH (ref 65–99)
POTASSIUM: 4.7 mmol/L (ref 3.5–5.2)
Sodium: 142 mmol/L (ref 134–144)
TOTAL PROTEIN: 7.3 g/dL (ref 6.0–8.5)

## 2016-03-26 LAB — LIPID PANEL
CHOL/HDL RATIO: 3.1 ratio (ref 0.0–5.0)
Cholesterol, Total: 181 mg/dL (ref 100–199)
HDL: 58 mg/dL (ref >39)
LDL CALC: 97 mg/dL (ref 0–99)
TRIGLYCERIDES: 132 mg/dL (ref 0–149)
VLDL CHOLESTEROL CAL: 26 mg/dL (ref 5–40)

## 2016-03-26 LAB — PSA: Prostate Specific Ag, Serum: 3.4 ng/mL (ref 0.0–4.0)

## 2016-03-26 LAB — TSH: TSH: 4.13 u[IU]/mL (ref 0.450–4.500)

## 2016-03-27 ENCOUNTER — Telehealth: Payer: Self-pay | Admitting: Family Medicine

## 2016-03-27 DIAGNOSIS — R319 Hematuria, unspecified: Secondary | ICD-10-CM

## 2016-03-27 NOTE — Telephone Encounter (Signed)
Phone call Discussed with patient trace blood and urine will recheck urinalysis.

## 2016-04-04 ENCOUNTER — Other Ambulatory Visit: Payer: Self-pay

## 2016-04-04 DIAGNOSIS — N401 Enlarged prostate with lower urinary tract symptoms: Secondary | ICD-10-CM

## 2016-04-05 ENCOUNTER — Other Ambulatory Visit: Payer: Medicare Other

## 2016-04-05 DIAGNOSIS — N401 Enlarged prostate with lower urinary tract symptoms: Secondary | ICD-10-CM

## 2016-04-06 LAB — PSA: PROSTATE SPECIFIC AG, SERUM: 3.1 ng/mL (ref 0.0–4.0)

## 2016-04-10 NOTE — Progress Notes (Signed)
9:00 AM   Jonathan Burns 15-Dec-1945 [id number]  Referring provider: Guadalupe Maple, MD [address] [place], [place] [address]  Chief Complaint  Patient presents with  . Benign Prostatic Hypertrophy    1 year follow up     HPI: Patient is a 71 year old Caucasian male with a history of a prostate nodule, BPH with LUTS and an incidental finding of AMH who presents today for 12 month follow-up.  Prostate nodule Patient was originally referred to [place] for a possible prostate nodule found by his primary care physician.  It has not been appreciated on subsequent exams.  He does not have a family history of prostate cancer.  His most recent PSA was 3.1 ng/mL on 04/05/2016.  BPH with LUTS His IPSS score today is 2, which is mild lower urinary tract symptomatology. He is pleased with his quality life due to his urinary symptoms. His PVR is 61 mL. His previous I PSS score was 1/2.  His previous  PVR is 170 mL.  Patient states he is doing well and without complaints.   He denies any dysuria, hematuria or suprapubic pain.  His has not seen an urologist in the past.  He also denies any recent fevers, chills, nausea or vomiting.  He does not have a family history of PCa.     IPSS    Row Name 04/11/16 0800         International Prostate Symptom Score   How often have you had the sensation of not emptying your bladder? Not at All     How often have you had to urinate less than every two hours? Less than 1 in 5 times     How often have you found you stopped and started again several times when you urinated? Not at All     How often have you found it difficult to postpone urination? Not at All     How often have you had a weak urinary stream? Not at All     How often have you had to strain to start urination? Not at All     How many times did you typically get up at night to urinate? 1 Time     Total IPSS Score 2       Quality of Life due to urinary symptoms   If you were to spend the rest of  your life with your urinary condition just the way it is now how would you feel about that? Pleased        Score:  1-7 Mild 8-19 Moderate 20-35 Severe  Microscopic hematuria Patient was found to have 3-10 RBC's on a routine physical exam on 03/25/2016.  Patient doesn't have a prior history of microscopic hematuria.   He does not have a prior history of recurrent urinary tract infections, nephrolithiasis, trauma to the genitourinary tract, BPH or malignancies of the genitourinary tract.  He does have a family medical history of nephrolithiasis, malignancies of the genitourinary tract or hematuria.  Today, she/he are having/not having symptoms of frequent urination, urgency, dysuria, nocturia, incontinence, hesitancy, intermittency, straining to urinate or a weak urinary stream.  His UA today demonstrates 0-2 RBC's.  He is not experiencing any suprapubic pain, abdominal pain or flank pain. He denies any recent fevers, chills, nausea or vomiting.  He has not had any recent imaging studies.   He is a former smoker, with a 1 ppd history.  Quit 30 years ago.  He is  not exposed to secondhand smoke.  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   PMH: Past Medical History:  Diagnosis Date  . Anemia   . BPH (benign prostatic hypertrophy)   . Chronic GI bleeding   . COPD (chronic obstructive pulmonary disease) ([place])   . Hyperlipidemia   . Hypertension   . Prostate nodule   . PUD (peptic ulcer disease)   . Rising PSA level     Surgical History: Past Surgical History:  Procedure Laterality Date  . COLONOSCOPY  2015  . ESOPHAGOGASTRODUODENOSCOPY  2015    Home Medications:  Allergies as of 04/11/2016   No Known Allergies     Medication List       Accurate as of 04/11/16  9:00 AM. Always use your most recent med list.          amLODipine 10 MG tablet Commonly known as:  NORVASC Take 1 tablet (10 mg total) by mouth daily.   B-12 2500 MCG Tabs Take by mouth.   carvedilol 25 MG  tablet Commonly known as:  COREG Take 1 tablet (25 mg total) by mouth 2 (two) times daily with a meal.   Fish Oil 1000 MG Caps Take by mouth.   Iron 325 (65 Fe) MG Tabs Take 1 tablet by mouth daily.   L-METHYLFOLATE CALCIUM PO Take by mouth.   LORazepam 1 MG tablet Commonly known as:  ATIVAN Take 1 tablet (1 mg total) by mouth daily as needed for anxiety.   losartan 100 MG tablet Commonly known as:  COZAAR Take 1 tablet (100 mg total) by mouth daily.   MENS MULTIVITAMIN PLUS PO Take by mouth daily.   pantoprazole 40 MG tablet Commonly known as:  PROTONIX Take 1 tablet (40 mg total) by mouth daily.   PROAIR HFA 108 (90 Base) MCG/ACT inhaler Generic drug:  albuterol Inhale into the lungs. Reported on 04/12/2015       Allergies: No Known Allergies  Family History: Family History  Problem Relation Age of Onset  . Cancer Mother     breast  . Diabetes Mother   . Aneurysm Father   . Kidney disease Neg Hx   . Prostate cancer Neg Hx   . Kidney cancer Neg Hx     Social History:  reports that he quit smoking about 29 years ago. His smoking use included Cigarettes. He has a 30.00 pack-year smoking history. He has never used smokeless tobacco. He reports that he drinks alcohol. He reports that he does not use drugs.  ROS: UROLOGY Frequent Urination?: No Hard to postpone urination?: No Burning/pain with urination?: No Get up at night to urinate?: No Leakage of urine?: No Urine stream starts and stops?: No Trouble starting stream?: No Do you have to strain to urinate?: No Blood in urine?: No Urinary tract infection?: No Sexually transmitted disease?: No Injury to kidneys or bladder?: No Painful intercourse?: No Weak stream?: No Erection problems?: No Penile pain?: No  Gastrointestinal Nausea?: No Vomiting?: No Indigestion/heartburn?: No Diarrhea?: No Constipation?: No  Constitutional Fever: No Night sweats?: No Weight loss?: No Fatigue?:  No  Skin Skin rash/lesions?: No Itching?: No  Eyes Blurred vision?: No Double vision?: No  Ears/Nose/Throat Sore throat?: No Sinus problems?: No  Hematologic/Lymphatic Swollen glands?: No Easy bruising?: No  Cardiovascular Leg swelling?: No Chest pain?: No  Respiratory Cough?: No Shortness of breath?: No  Endocrine Excessive thirst?: No  Musculoskeletal Back pain?: Yes Joint pain?: No  Neurological Headaches?: No Dizziness?: No  Psychologic Depression?: No Anxiety?: No  Physical Exam: BP 132/69   Pulse 62   Ht '5\' 10"'$  (1.778 m)   Wt 222 lb 1.6 oz (100.7 kg)   BMI 31.87 kg/m   GU: Patient with circumcised phallus.  Urethral meatus is patent.  No penile discharge. No penile lesions or rashes. Scrotum without lesions, cysts, rashes and/or edema.  Testicles are located scrotally bilaterally. No masses are appreciated in the testicles. Left and right epididymis are normal. Rectal: Patient with  normal sphincter tone. Perineum without scarring or rashes. No rectal masses are appreciated. Prostate is approximately 60 grams, no nodules are appreciated. Seminal vesicles are normal.   Laboratory Data: Lab Results  Component Value Date   WBC 6.2 03/25/2016   HGB 12.5 (L) 09/28/2014   HCT 47.9 03/25/2016   MCV 92 03/25/2016   PLT 183 03/25/2016    Lab Results  Component Value Date   CREATININE 0.95 03/25/2016   PSA History  3.1 ng/mL on 04/05/2016  3.4 ng/mL on 03/25/2016  2.2 ng/mL on 04/07/2015  2.8 ng/mL on 02/23/2015  2.2 ng/mL on 10/10/2014  3.2 ng/mL on 12/15  2.3 ng/mLon 12/14  2.0 ng/mL on 12/13   2.3 ng/mL on 11/12             1.03 ng/mL on 11/10  Pertinent Imaging Results for Jonathan Burns, Jonathan Burns (MRN [id number]) as of 04/11/2016 09:03  Ref. Range 04/11/2016 08:33  Scan Result Unknown 61     Assessment & Plan:    1. Prostate nodule  - history of a prostate nodule found on exam by his PCP  - not been appreciated on subsequent exams  -  continue to monitor with DRE's and PSA's q year  2. Rising PSA velocity  -  PSA from 2.2 to 3.1 over one year - but appears that PSA trends around ~2 to ~3 - it was 3.2 in 2015  - I discussed the AUA Guideline's (2013) for men aged 29+ years or any man with less than a 10 to 15 year life expectancy that screening is not recommended.  If the individual is in excellent health and after discussion it is decided to do a screening PSA, the threshold for biopsy should be raised to 10 ng/mL and if the PSA returns below 3 ng/mL, discontinue screening - according to the social security web site he can be expected to live until age 31 - pending co morbidities - with shared decision making it is decided that we will continue the DRE's but discontinue the PSA's   3. BPH with LUTS  - IPSS score is 2/1, it is stable  - Continue conservative management, avoiding bladder irritants and timed voiding's  - RTC in 12 months for IPS'S, PVR and exam   - BLADDER SCAN AMB NON-IMAGING  4. Microscopic hematuria  - I explained to the patient that there are a number of causes that can be associated with blood in the urine, such as stones, BPH, UTI's, damage to the urinary tract and/or cancer.  - We discussed the AUA guidelines concerning AMH - explained the risk of 2% of having an urological cancer at this time - his UA today did not demonstrate AMH and he has a follow up UA at Dr. Rance Muir office - with shared decision making it is decided that if no hematuria is found on his third UA we will not pursue a hematuria work, but if there is [place] we will pursue hematuria workup  -  he will report any gross hematuria   - UA   Return in about 1 year (around 04/11/2017) for IPSS, PVR and exam.  Zara Council, [facility]  [place] [address], [place], [place] [address] [phone]

## 2016-04-11 ENCOUNTER — Ambulatory Visit (INDEPENDENT_AMBULATORY_CARE_PROVIDER_SITE_OTHER): Payer: Medicare Other | Admitting: Urology

## 2016-04-11 ENCOUNTER — Encounter: Payer: Self-pay | Admitting: Urology

## 2016-04-11 VITALS — BP 132/69 | HR 62 | Ht 70.0 in | Wt 222.1 lb

## 2016-04-11 DIAGNOSIS — N400 Enlarged prostate without lower urinary tract symptoms: Secondary | ICD-10-CM | POA: Diagnosis not present

## 2016-04-11 DIAGNOSIS — R972 Elevated prostate specific antigen [PSA]: Secondary | ICD-10-CM | POA: Diagnosis not present

## 2016-04-11 DIAGNOSIS — N138 Other obstructive and reflux uropathy: Secondary | ICD-10-CM | POA: Diagnosis not present

## 2016-04-11 DIAGNOSIS — N402 Nodular prostate without lower urinary tract symptoms: Secondary | ICD-10-CM

## 2016-04-11 DIAGNOSIS — R3129 Other microscopic hematuria: Secondary | ICD-10-CM | POA: Diagnosis not present

## 2016-04-11 DIAGNOSIS — N401 Enlarged prostate with lower urinary tract symptoms: Secondary | ICD-10-CM

## 2016-04-11 LAB — URINALYSIS, COMPLETE
BILIRUBIN UA: NEGATIVE
Glucose, UA: NEGATIVE
KETONES UA: NEGATIVE
Leukocytes, UA: NEGATIVE
Nitrite, UA: NEGATIVE
Protein, UA: NEGATIVE
RBC UA: NEGATIVE
SPEC GRAV UA: 1.01 (ref 1.005–1.030)
UUROB: 0.2 mg/dL (ref 0.2–1.0)
pH, UA: 7 (ref 5.0–7.5)

## 2016-04-11 LAB — MICROSCOPIC EXAMINATION
Bacteria, UA: NONE SEEN
Epithelial Cells (non renal): NONE SEEN /hpf (ref 0–10)
WBC UA: NONE SEEN /HPF (ref 0–?)

## 2016-04-11 LAB — BLADDER SCAN AMB NON-IMAGING: SCAN RESULT: 61

## 2016-05-23 DIAGNOSIS — I10 Essential (primary) hypertension: Secondary | ICD-10-CM | POA: Diagnosis not present

## 2016-05-23 DIAGNOSIS — H2513 Age-related nuclear cataract, bilateral: Secondary | ICD-10-CM | POA: Diagnosis not present

## 2016-05-23 DIAGNOSIS — H33312 Horseshoe tear of retina without detachment, left eye: Secondary | ICD-10-CM | POA: Diagnosis not present

## 2016-05-23 DIAGNOSIS — H5053 Vertical heterophoria: Secondary | ICD-10-CM | POA: Diagnosis not present

## 2016-05-23 DIAGNOSIS — H338 Other retinal detachments: Secondary | ICD-10-CM | POA: Diagnosis not present

## 2016-05-23 DIAGNOSIS — H524 Presbyopia: Secondary | ICD-10-CM | POA: Diagnosis not present

## 2016-05-23 DIAGNOSIS — H52223 Regular astigmatism, bilateral: Secondary | ICD-10-CM | POA: Diagnosis not present

## 2016-05-23 DIAGNOSIS — H353131 Nonexudative age-related macular degeneration, bilateral, early dry stage: Secondary | ICD-10-CM | POA: Diagnosis not present

## 2016-05-23 DIAGNOSIS — H43813 Vitreous degeneration, bilateral: Secondary | ICD-10-CM | POA: Diagnosis not present

## 2016-05-23 DIAGNOSIS — H5213 Myopia, bilateral: Secondary | ICD-10-CM | POA: Diagnosis not present

## 2016-05-23 DIAGNOSIS — B0052 Herpesviral keratitis: Secondary | ICD-10-CM | POA: Diagnosis not present

## 2016-06-14 DIAGNOSIS — G4733 Obstructive sleep apnea (adult) (pediatric): Secondary | ICD-10-CM | POA: Diagnosis not present

## 2016-06-14 DIAGNOSIS — E669 Obesity, unspecified: Secondary | ICD-10-CM | POA: Diagnosis not present

## 2016-07-04 DIAGNOSIS — H2513 Age-related nuclear cataract, bilateral: Secondary | ICD-10-CM | POA: Diagnosis not present

## 2016-07-04 DIAGNOSIS — H33312 Horseshoe tear of retina without detachment, left eye: Secondary | ICD-10-CM | POA: Diagnosis not present

## 2016-07-04 DIAGNOSIS — H43813 Vitreous degeneration, bilateral: Secondary | ICD-10-CM | POA: Diagnosis not present

## 2016-08-27 ENCOUNTER — Encounter: Payer: Self-pay | Admitting: Family Medicine

## 2016-08-28 DIAGNOSIS — T1512XA Foreign body in conjunctival sac, left eye, initial encounter: Secondary | ICD-10-CM | POA: Diagnosis not present

## 2016-08-28 DIAGNOSIS — H5213 Myopia, bilateral: Secondary | ICD-10-CM | POA: Diagnosis not present

## 2016-08-28 DIAGNOSIS — H16142 Punctate keratitis, left eye: Secondary | ICD-10-CM | POA: Diagnosis not present

## 2016-08-28 DIAGNOSIS — B0052 Herpesviral keratitis: Secondary | ICD-10-CM | POA: Diagnosis not present

## 2016-08-28 DIAGNOSIS — H353131 Nonexudative age-related macular degeneration, bilateral, early dry stage: Secondary | ICD-10-CM | POA: Diagnosis not present

## 2016-08-28 DIAGNOSIS — H5053 Vertical heterophoria: Secondary | ICD-10-CM | POA: Diagnosis not present

## 2016-08-28 DIAGNOSIS — H52223 Regular astigmatism, bilateral: Secondary | ICD-10-CM | POA: Diagnosis not present

## 2016-08-28 DIAGNOSIS — H43813 Vitreous degeneration, bilateral: Secondary | ICD-10-CM | POA: Diagnosis not present

## 2016-08-28 DIAGNOSIS — I10 Essential (primary) hypertension: Secondary | ICD-10-CM | POA: Diagnosis not present

## 2016-08-28 DIAGNOSIS — H524 Presbyopia: Secondary | ICD-10-CM | POA: Diagnosis not present

## 2016-08-28 DIAGNOSIS — H338 Other retinal detachments: Secondary | ICD-10-CM | POA: Diagnosis not present

## 2016-09-23 ENCOUNTER — Ambulatory Visit: Payer: Medicare Other | Admitting: Family Medicine

## 2016-10-07 ENCOUNTER — Ambulatory Visit (INDEPENDENT_AMBULATORY_CARE_PROVIDER_SITE_OTHER): Payer: Medicare Other | Admitting: Family Medicine

## 2016-10-07 VITALS — BP 134/72 | HR 59 | Wt 220.0 lb

## 2016-10-07 DIAGNOSIS — F419 Anxiety disorder, unspecified: Secondary | ICD-10-CM

## 2016-10-07 DIAGNOSIS — E785 Hyperlipidemia, unspecified: Secondary | ICD-10-CM

## 2016-10-07 DIAGNOSIS — G4733 Obstructive sleep apnea (adult) (pediatric): Secondary | ICD-10-CM | POA: Diagnosis not present

## 2016-10-07 DIAGNOSIS — I10 Essential (primary) hypertension: Secondary | ICD-10-CM

## 2016-10-07 DIAGNOSIS — Z9989 Dependence on other enabling machines and devices: Secondary | ICD-10-CM

## 2016-10-07 MED ORDER — LORAZEPAM 1 MG PO TABS: 1.0000 mg | ORAL_TABLET | Freq: Every day | ORAL | 1 refills | Status: DC | PRN

## 2016-10-07 NOTE — Assessment & Plan Note (Signed)
The current medical regimen is effective;  continue present plan and medications.  

## 2016-10-07 NOTE — Progress Notes (Signed)
BP 134/72 (BP Location: Left Arm)   Pulse (!) 59   Wt 220 lb (99.8 kg)   SpO2 95%   BMI 31.57 kg/m    Subjective:    Patient ID: Jonathan Burns, male    DOB: 11-24-1945, 71 y.o.   MRN: [id number]  HPI: Jonathan Burns is a 71 y.o. male  Chief Complaint  Patient presents with  . Follow-up  . Hypertension  Patient follow-up blood pressure doing well no complaints from medications good control and takes faithfully. Anxiety controlled with occasional lorazepam 30 generally last almost a year or so Doing well with CPAP is finding it needs last reflux symptoms started about easing off of Protonix which is fine.  Relevant past medical, surgical, family and social history reviewed and updated as indicated. Interim medical history since our last visit reviewed. Allergies and medications reviewed and updated.  Review of Systems  Constitutional: Negative.   Respiratory: Negative.   Cardiovascular: Negative.     Per HPI unless specifically indicated above     Objective:    BP 134/72 (BP Location: Left Arm)   Pulse (!) 59   Wt 220 lb (99.8 kg)   SpO2 95%   BMI 31.57 kg/m   Wt Readings from Last 3 Encounters:  10/07/16 220 lb (99.8 kg)  04/11/16 222 lb 1.6 oz (100.7 kg)  03/25/16 220 lb (99.8 kg)    Physical Exam  Constitutional: He is oriented to person, place, and time. He appears well-developed and well-nourished.  HENT:  Head: Normocephalic and atraumatic.  Eyes: Conjunctivae and EOM are normal.  Neck: Normal range of motion.  Cardiovascular: Normal rate, regular rhythm and normal heart sounds.   Pulmonary/Chest: Effort normal and breath sounds normal.  Musculoskeletal: Normal range of motion.  Neurological: He is alert and oriented to person, place, and time.  Skin: No erythema.  Psychiatric: He has a normal mood and affect. His behavior is normal. Judgment and thought content normal.    Results for orders placed or performed in visit on 04/11/16  Microscopic  Examination  Result Value Ref Range   WBC, UA None seen 0 - 5 /hpf   RBC, UA 0-2 0 - 2 /hpf   Epithelial Cells (non renal) None seen 0 - 10 /hpf   Bacteria, UA None seen None seen/Few  Urinalysis, Complete  Result Value Ref Range   Specific Gravity, UA 1.010 1.005 - 1.030   pH, UA 7.0 5.0 - 7.5   Color, UA Yellow Yellow   Appearance Ur Clear Clear   Leukocytes, UA Negative Negative   Protein, UA Negative Negative/Trace   Glucose, UA Negative Negative   Ketones, UA Negative Negative   RBC, UA Negative Negative   Bilirubin, UA Negative Negative   Urobilinogen, Ur 0.2 0.2 - 1.0 mg/dL   Nitrite, UA Negative Negative   Microscopic Examination See below:   BLADDER SCAN AMB NON-IMAGING  Result Value Ref Range   Scan Result 61       Assessment & Plan:   Problem List Items Addressed This Visit      Cardiovascular and Mediastinum   Hypertension - Primary    The current medical regimen is effective;  continue present plan and medications.       Relevant Orders   Basic metabolic panel     Respiratory   OSA on CPAP    The current medical regimen is effective;  continue present plan and medications.  Other   Hyperlipidemia    The current medical regimen is effective;  continue present plan and medications.       Acute anxiety    The current medical regimen is effective;  continue present plan and medications.       Relevant Medications   LORazepam (ATIVAN) 1 MG tablet       Follow up plan: Return in about 6 months (around 04/09/2017) for Physical Exam.

## 2016-10-08 ENCOUNTER — Encounter: Payer: Self-pay | Admitting: Family Medicine

## 2016-10-08 LAB — BASIC METABOLIC PANEL
BUN / CREAT RATIO: 11 (ref 10–24)
BUN: 11 mg/dL (ref 8–27)
CALCIUM: 9.2 mg/dL (ref 8.6–10.2)
CHLORIDE: 104 mmol/L (ref 96–106)
CO2: 22 mmol/L (ref 20–29)
Creatinine, Ser: 0.96 mg/dL (ref 0.76–1.27)
GFR, EST AFRICAN AMERICAN: 91 mL/min/{1.73_m2} (ref >59)
GFR, EST NON AFRICAN AMERICAN: 79 mL/min/{1.73_m2} (ref >59)
Glucose: 101 mg/dL — ABNORMAL HIGH (ref 65–99)
Potassium: 4.9 mmol/L (ref 3.5–5.2)
Sodium: 140 mmol/L (ref 134–144)

## 2016-10-29 DIAGNOSIS — L570 Actinic keratosis: Secondary | ICD-10-CM | POA: Diagnosis not present

## 2016-10-29 DIAGNOSIS — L578 Other skin changes due to chronic exposure to nonionizing radiation: Secondary | ICD-10-CM | POA: Diagnosis not present

## 2016-10-29 DIAGNOSIS — Z86018 Personal history of other benign neoplasm: Secondary | ICD-10-CM | POA: Diagnosis not present

## 2016-12-02 DIAGNOSIS — Z23 Encounter for immunization: Secondary | ICD-10-CM | POA: Diagnosis not present

## 2016-12-24 DIAGNOSIS — H43813 Vitreous degeneration, bilateral: Secondary | ICD-10-CM | POA: Diagnosis not present

## 2016-12-24 DIAGNOSIS — H353131 Nonexudative age-related macular degeneration, bilateral, early dry stage: Secondary | ICD-10-CM | POA: Diagnosis not present

## 2016-12-24 DIAGNOSIS — B0052 Herpesviral keratitis: Secondary | ICD-10-CM | POA: Diagnosis not present

## 2016-12-24 DIAGNOSIS — H52223 Regular astigmatism, bilateral: Secondary | ICD-10-CM | POA: Diagnosis not present

## 2016-12-24 DIAGNOSIS — H524 Presbyopia: Secondary | ICD-10-CM | POA: Diagnosis not present

## 2016-12-24 DIAGNOSIS — H5213 Myopia, bilateral: Secondary | ICD-10-CM | POA: Diagnosis not present

## 2016-12-24 DIAGNOSIS — H338 Other retinal detachments: Secondary | ICD-10-CM | POA: Diagnosis not present

## 2016-12-24 DIAGNOSIS — I10 Essential (primary) hypertension: Secondary | ICD-10-CM | POA: Diagnosis not present

## 2017-01-30 ENCOUNTER — Emergency Department
Admission: EM | Admit: 2017-01-30 | Discharge: 2017-01-30 | Disposition: A | Discharge status: Home / Self Care | Payer: Medicare Other | Attending: Emergency Medicine | Admitting: Emergency Medicine

## 2017-01-30 ENCOUNTER — Encounter: Payer: Self-pay | Admitting: Emergency Medicine

## 2017-01-30 ENCOUNTER — Emergency Department: Payer: Medicare Other

## 2017-01-30 DIAGNOSIS — Y998 Other external cause status: Secondary | ICD-10-CM | POA: Insufficient documentation

## 2017-01-30 DIAGNOSIS — Y929 Unspecified place or not applicable: Secondary | ICD-10-CM | POA: Insufficient documentation

## 2017-01-30 DIAGNOSIS — Y9389 Activity, other specified: Secondary | ICD-10-CM | POA: Insufficient documentation

## 2017-01-30 DIAGNOSIS — I10 Essential (primary) hypertension: Secondary | ICD-10-CM | POA: Insufficient documentation

## 2017-01-30 DIAGNOSIS — F419 Anxiety disorder, unspecified: Secondary | ICD-10-CM | POA: Diagnosis not present

## 2017-01-30 DIAGNOSIS — J449 Chronic obstructive pulmonary disease, unspecified: Secondary | ICD-10-CM | POA: Insufficient documentation

## 2017-01-30 DIAGNOSIS — Z87891 Personal history of nicotine dependence: Secondary | ICD-10-CM | POA: Diagnosis not present

## 2017-01-30 DIAGNOSIS — W293XXA Contact with powered garden and outdoor hand tools and machinery, initial encounter: Secondary | ICD-10-CM | POA: Insufficient documentation

## 2017-01-30 DIAGNOSIS — Z79899 Other long term (current) drug therapy: Secondary | ICD-10-CM | POA: Diagnosis not present

## 2017-01-30 DIAGNOSIS — S61111A Laceration without foreign body of right thumb with damage to nail, initial encounter: Secondary | ICD-10-CM | POA: Diagnosis not present

## 2017-01-30 DIAGNOSIS — S61011A Laceration without foreign body of right thumb without damage to nail, initial encounter: Secondary | ICD-10-CM | POA: Diagnosis not present

## 2017-01-30 MED ORDER — LIDOCAINE HCL (PF) 1 % IJ SOLN
INTRAMUSCULAR | Status: AC
  Administered 2017-01-30: 10 mL
  Filled 2017-01-30: qty 10

## 2017-01-30 MED ORDER — OXYCODONE-ACETAMINOPHEN 5-325 MG PO TABS: 1.0000 | ORAL_TABLET | Freq: Four times a day (QID) | ORAL | 0 refills | Status: AC | PRN

## 2017-01-30 MED ORDER — OXYCODONE-ACETAMINOPHEN 5-325 MG PO TABS
1.0000 | ORAL_TABLET | Freq: Once | ORAL | Status: AC
  Administered 2017-01-30: 1 via ORAL
  Filled 2017-01-30: qty 1

## 2017-01-30 MED ORDER — LIDOCAINE HCL 1 % IJ SOLN
10.0000 mL | Freq: Once | INTRAMUSCULAR | Status: AC
  Administered 2017-01-30: 10 mL
  Filled 2017-01-30: qty 10

## 2017-01-30 MED ORDER — SULFAMETHOXAZOLE-TRIMETHOPRIM 800-160 MG PO TABS: 1.0000 | ORAL_TABLET | Freq: Two times a day (BID) | ORAL | 0 refills | Status: AC

## 2017-01-30 NOTE — ED Notes (Signed)
Thumb vs table saw - laceration thru the top of thumb midline possibly down to first joint. Pt states heard his "bone break". xr ordered

## 2017-01-30 NOTE — ED Provider Notes (Signed)
[facility] Emergency Department Provider Note  ____________________________________________  Time seen: Approximately 4:16 PM  I have reviewed the triage vital signs and the nursing notes.   HISTORY  Chief Complaint Hand Injury and Laceration    HPI Jonathan Burns is a 71 y.o. male presents to the emergency department with a 4 cm laceration from the DIP joint at the volar aspect of the right thumb, through the fingernail to the DIP joint at the dorsal aspect of the right thumb.  Patient reports that he sustained laceration with a band saw.  Patient denies weakness, radiculopathy or changes in sensation of the upper extremity.  Tetanus status is up-to-date.  Past Medical History:  Diagnosis Date  . Anemia   . BPH (benign prostatic hypertrophy)   . Chronic GI bleeding   . COPD (chronic obstructive pulmonary disease) ([place])   . Hyperlipidemia   . Hypertension   . Prostate nodule   . PUD (peptic ulcer disease)   . Rising PSA level     Patient Active Problem List   Diagnosis Date Noted  . BPH with obstruction/lower urinary tract symptoms 04/15/2015  . Acute anxiety 02/23/2015  . Prostate nodule 10/10/2014  . Rising PSA level 10/10/2014  . OSA on CPAP 08/31/2014  . Chronic GI bleeding 08/17/2014  . Hyperlipidemia   . Hypertension   . COPD (chronic obstructive pulmonary disease) ([place])     Past Surgical History:  Procedure Laterality Date  . COLONOSCOPY  2015  . ESOPHAGOGASTRODUODENOSCOPY  2015    Prior to Admission medications   Medication Sig Start Date End Date Taking? Authorizing Provider  albuterol (PROAIR HFA) 108 (90 Base) MCG/ACT inhaler Inhale into the lungs. Reported on 04/12/2015 08/17/14   [provider]  amLODipine (NORVASC) 10 MG tablet Take 1 tablet (10 mg total) by mouth daily. 03/25/16   Guadalupe Maple, MD  carvedilol (COREG) 25 MG tablet Take 1 tablet (25 mg total) by mouth 2 (two) times daily with a meal. 03/25/16   Crissman,  Jeannette How, MD  Cyanocobalamin (B-12) 2500 MCG TABS Take by mouth.    [provider]  Ferrous Sulfate (IRON) 325 (65 FE) MG TABS Take 1 tablet by mouth daily. Patient taking differently: Take 2 tablets by mouth daily.  09/07/14   Guadalupe Maple, MD  L-METHYLFOLATE CALCIUM PO Take by mouth.    [provider]  LORazepam (ATIVAN) 1 MG tablet Take 1 tablet (1 mg total) by mouth daily as needed for anxiety. 10/07/16   Guadalupe Maple, MD  losartan (COZAAR) 100 MG tablet Take 1 tablet (100 mg total) by mouth daily. 03/25/16   Guadalupe Maple, MD  Multiple Vitamins-Minerals (MENS MULTIVITAMIN PLUS PO) Take by mouth daily.    [provider]  Omega-3 Fatty Acids (FISH OIL) 1000 MG CAPS Take by mouth.    [provider]  oxyCODONE-acetaminophen (ROXICET) 5-325 MG tablet Take 1 tablet by mouth every 6 (six) hours as needed for up to 3 days for severe pain. 01/30/17 02/02/17  Lannie Fields, PA-C  pantoprazole (PROTONIX) 40 MG tablet Take 1 tablet (40 mg total) by mouth daily. 03/25/16   Guadalupe Maple, MD  sulfamethoxazole-trimethoprim (BACTRIM DS,SEPTRA DS) 800-160 MG tablet Take 1 tablet by mouth 2 (two) times daily for 7 days. 01/30/17 02/06/17  Lannie Fields, PA-C    Allergies Patient has no known allergies.  Family History  Problem Relation Age of Onset  . Cancer Mother  breast  . Diabetes Mother   . Aneurysm Father   . Kidney disease Neg Hx   . Prostate cancer Neg Hx   . Kidney cancer Neg Hx     Social History Social History   Tobacco Use  . Smoking status: Former Smoker    Packs/day: 1.00    Years: 30.00    Pack years: 30.00    Types: Cigarettes    Last attempt to quit: 08/17/1986    Years since quitting: 30.4  . Smokeless tobacco: Never Used  Substance Use Topics  . Alcohol use: Yes    Alcohol/week: 0.0 oz    Comment: occasional  . Drug use: No     Review of Systems  Constitutional: No fever/chills Eyes: No visual changes. No  discharge ENT: No upper respiratory complaints. Cardiovascular: no chest pain. Respiratory: no cough. No SOB. Gastrointestinal: No abdominal pain.  No nausea, no vomiting.  No diarrhea.  No constipation. Musculoskeletal: Negative for musculoskeletal pain. Skin: Patient has right thumb laceration. Neurological: Negative for headaches, focal weakness or numbness.   ____________________________________________   PHYSICAL EXAM:  VITAL SIGNS: ED Triage Vitals  Enc Vitals Group     BP 01/30/17 1538 (!) 160/79     Pulse Rate 01/30/17 1538 77     Resp --      Temp 01/30/17 1538 97.6 F (36.4 C)     Temp Source 01/30/17 1538 Oral     SpO2 01/30/17 1538 96 %     Weight 01/30/17 1528 220 lb (99.8 kg)     Height 01/30/17 1538 5\' 9"  (1.753 m)     Head Circumference --      Peak Flow --      Pain Score 01/30/17 1531 3     Pain Loc --      Pain Edu? --      Excl. in [place]? --      Constitutional: Alert and oriented. Well appearing and in no acute distress. Eyes: Conjunctivae are normal. PERRL. EOMI. Head: Atraumatic. Cardiovascular: Normal rate, regular rhythm. Normal S1 and S2.  Good peripheral circulation. Respiratory: Normal respiratory effort without tachypnea or retractions. Lungs CTAB. Good air entry to the bases with no decreased or absent breath sounds. Musculoskeletal: Full range of motion to all extremities. No gross deformities appreciated. Neurologic:  Normal speech and language. No gross focal neurologic deficits are appreciated.  Skin: Patient has a 4 cm laceration of the right thumb.  Laceration goes from the DIP joint at the volar aspect of the right thumb and to the DIP joint at the dorsal aspect of the right thumb.  Laceration goes through the nail bed.  Palpable radial pulse bilaterally and symmetrically. Psychiatric: Mood and affect are normal. Speech and behavior are normal. Patient exhibits appropriate insight and  judgement.   ____________________________________________   LABS (all labs ordered are listed, but only abnormal results are displayed)  Labs Reviewed - No data to display ____________________________________________  EKG   ____________________________________________  RADIOLOGY Unk Pinto, personally viewed and evaluated these images (plain radiographs) as part of my medical decision making, as well as reviewing the written report by the radiologist.    Dg Finger Thumb Right  Result Date: 01/30/2017 CLINICAL DATA:  Table saw injury to the right thumb laceration. EXAM: RIGHT THUMB 2+V COMPARISON:  None. FINDINGS: Soft tissue laceration involving the ulnar aspect of the thumb with soft tissue debris noted. Posttraumatic bony avulsion involving the ulnar aspect of the tuft of the  distal phalanx of the thumb is noted. No joint dislocation is seen. IMPRESSION: Soft tissue laceration of the thumb with underlying bony avulsion involving a portion of the tuft. Electronically Signed   By: Ashley Royalty M.D.   On: 01/30/2017 16:41    ____________________________________________    PROCEDURES  Procedure(s) performed:    Procedures    Medications  oxyCODONE-acetaminophen (PERCOCET/ROXICET) 5-325 MG per tablet 1 tablet (1 tablet Oral Given 01/30/17 1623)  lidocaine (XYLOCAINE) 1 % (with pres) injection 10 mL (10 mLs Infiltration Given 01/30/17 1645)     ____________________________________________   INITIAL IMPRESSION / ASSESSMENT AND PLAN / ED COURSE  Pertinent labs & imaging results that were available during my care of the patient were reviewed by me and considered in my medical decision making (see chart for details).  Review of the [place] CSRS was performed in accordance of the [place] prior to dispensing any controlled drugs.     Assessment and plan Right Thumb Laceration: Patient presents to the emergency department with a right thumb laceration sustained with a  band saw. X ray examination reveals an avulsion of the distal phalanx of right thumb consistent with distal tuft fracture.  Patient underwent laceration repair without complication.  He was advised to have sutures removed by primary care in 10 days.  He was discharged with Bactrim.  Vital signs were reassuring prior to discharge.  All patient questions were answered.   ____________________________________________  FINAL CLINICAL IMPRESSION(S) / ED DIAGNOSES  Final diagnoses:  Laceration of right thumb without foreign body with damage to nail, initial encounter      NEW MEDICATIONS STARTED DURING THIS VISIT:  ED Discharge Orders        Ordered    oxyCODONE-acetaminophen (ROXICET) 5-325 MG tablet  Every 6 hours PRN     01/30/17 1743    sulfamethoxazole-trimethoprim (BACTRIM DS,SEPTRA DS) 800-160 MG tablet  2 times daily     01/30/17 1744          This chart was dictated using voice recognition software/Dragon. Despite best efforts to proofread, errors can occur which can change the meaning. Any change was purely unintentional.    Lannie Fields, PA-C 01/30/17 Belva Agee, MD 01/31/17 [phone]

## 2017-01-30 NOTE — ED Triage Notes (Signed)
Presents with laceration to r thumb from band saw

## 2017-01-30 NOTE — ED Notes (Signed)
Neither the computer in the room or the computer on wheels working - pt unable to sign for discharge.

## 2017-02-19 ENCOUNTER — Encounter: Payer: Self-pay | Admitting: Family Medicine

## 2017-04-10 ENCOUNTER — Other Ambulatory Visit: Payer: Self-pay | Admitting: Urology

## 2017-04-10 ENCOUNTER — Ambulatory Visit (INDEPENDENT_AMBULATORY_CARE_PROVIDER_SITE_OTHER): Payer: Medicare Other | Admitting: Urology

## 2017-04-10 ENCOUNTER — Encounter: Payer: Self-pay | Admitting: Urology

## 2017-04-10 VITALS — BP 150/87 | HR 68 | Ht 70.0 in | Wt 215.0 lb

## 2017-04-10 DIAGNOSIS — N401 Enlarged prostate with lower urinary tract symptoms: Secondary | ICD-10-CM | POA: Diagnosis not present

## 2017-04-10 DIAGNOSIS — N402 Nodular prostate without lower urinary tract symptoms: Secondary | ICD-10-CM

## 2017-04-10 DIAGNOSIS — N138 Other obstructive and reflux uropathy: Secondary | ICD-10-CM | POA: Diagnosis not present

## 2017-04-10 DIAGNOSIS — Z87448 Personal history of other diseases of urinary system: Secondary | ICD-10-CM | POA: Diagnosis not present

## 2017-04-10 DIAGNOSIS — R972 Elevated prostate specific antigen [PSA]: Secondary | ICD-10-CM | POA: Diagnosis not present

## 2017-04-10 DIAGNOSIS — C61 Malignant neoplasm of prostate: Secondary | ICD-10-CM | POA: Diagnosis not present

## 2017-04-10 NOTE — Progress Notes (Signed)
8:58 AM   Jonathan Burns Nov 14, 1945 [id number]  Referring provider: Guadalupe Maple, MD [address] [place], [place] [address]  Chief Complaint  Patient presents with  . Benign Prostatic Hypertrophy    1year     HPI: Patient is a 72 year old Caucasian male with a history of a prostate nodule, BPH with LUTS and an incidental finding of AMH who presents today for 12 month follow-up.  Prostate nodule Patient was originally referred to [place] for a possible prostate nodule found by his primary care physician.  It has not been appreciated on subsequent exams.  He does not have a family history of prostate cancer.  His most recent PSA was 3.1 ng/mL on 04/05/2016.  BPH with LUTS His IPSS score today is 2, which is mild lower urinary tract symptomatology. He is pleased with his quality life due to his urinary symptoms.  His previous I PSS score was 2/1.  His previous  PVR is 61 mL.  Patient states he is doing well and without complaints.   He denies any dysuria, hematuria or suprapubic pain.  He also denies any recent fevers, chills, nausea or vomiting.  He does not have a family history of PCa. IPSS    Row Name 04/10/17 0800         International Prostate Symptom Score   How often have you had the sensation of not emptying your bladder?  Not at All     How often have you had to urinate less than every two hours?  Less than 1 in 5 times     How often have you found you stopped and started again several times when you urinated?  Not at All     How often have you found it difficult to postpone urination?  Not at All     How often have you had a weak urinary stream?  Less than 1 in 5 times     How often have you had to strain to start urination?  Not at All     How many times did you typically get up at night to urinate?  None     Total IPSS Score  2       Quality of Life due to urinary symptoms   If you were to spend the rest of your life with your urinary condition just the way it is now  how would you feel about that?  Pleased        Score:  1-7 Mild 8-19 Moderate 20-35 Severe  History of hematuria Patient was found to have 3-10 RBC's on a routine physical exam on 03/25/2016 and advised of the risk of malignancies.  Subsequent UA's did not demonstrate AMH and patient did not want to pursue hematuria workup.  He is a former smoker, with a 1 ppd history.  Quit 30 years ago.  He is not exposed to secondhand smoke.  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  No reports of gross hematuria.    PMH: Past Medical History:  Diagnosis Date  . Anemia   . BPH (benign prostatic hypertrophy)   . Chronic GI bleeding   . COPD (chronic obstructive pulmonary disease) ([place])   . Hyperlipidemia   . Hypertension   . Prostate nodule   . PUD (peptic ulcer disease)   . Rising PSA level     Surgical History: Past Surgical History:  Procedure Laterality Date  . COLONOSCOPY  2015  . ESOPHAGOGASTRODUODENOSCOPY  2015  Home Medications:  Allergies as of 04/10/2017   No Known Allergies     Medication List        Accurate as of 04/10/17  8:58 AM. Always use your most recent med list.          amLODipine 10 MG tablet Commonly known as:  NORVASC Take 1 tablet (10 mg total) by mouth daily.   B-12 2500 MCG Tabs Take by mouth.   carvedilol 25 MG tablet Commonly known as:  COREG Take 1 tablet (25 mg total) by mouth 2 (two) times daily with a meal.   Fish Oil 1000 MG Caps Take by mouth.   Iron 325 (65 Fe) MG Tabs Take 1 tablet by mouth daily.   L-METHYLFOLATE CALCIUM PO Take by mouth.   LORazepam 1 MG tablet Commonly known as:  ATIVAN Take 1 tablet (1 mg total) by mouth daily as needed for anxiety.   losartan 100 MG tablet Commonly known as:  COZAAR Take 1 tablet (100 mg total) by mouth daily.   MENS MULTIVITAMIN PLUS PO Take by mouth daily.   pantoprazole 40 MG tablet Commonly known as:  PROTONIX Take 1 tablet (40 mg total) by mouth daily.   PROAIR HFA  108 (90 Base) MCG/ACT inhaler Generic drug:  albuterol Inhale into the lungs. Reported on 04/12/2015       Allergies: No Known Allergies  Family History: Family History  Problem Relation Age of Onset  . Cancer Mother        breast  . Diabetes Mother   . Aneurysm Father   . Kidney disease Neg Hx   . Prostate cancer Neg Hx   . Kidney cancer Neg Hx     Social History:  reports that he quit smoking about 30 years ago. His smoking use included cigarettes. He has a 30.00 pack-year smoking history. he has never used smokeless tobacco. He reports that he drinks alcohol. He reports that he does not use drugs.  ROS: UROLOGY Frequent Urination?: No Hard to postpone urination?: No Burning/pain with urination?: No Get up at night to urinate?: No Leakage of urine?: No Urine stream starts and stops?: No Trouble starting stream?: No Do you have to strain to urinate?: No Blood in urine?: No Urinary tract infection?: No Sexually transmitted disease?: No Injury to kidneys or bladder?: No Painful intercourse?: No Weak stream?: No Erection problems?: No Penile pain?: No  Gastrointestinal Nausea?: No Vomiting?: No Indigestion/heartburn?: Yes Diarrhea?: No Constipation?: No  Constitutional Fever: No Night sweats?: No Weight loss?: No Fatigue?: No  Skin Skin rash/lesions?: No Itching?: No  Eyes Blurred vision?: No Double vision?: No  Ears/Nose/Throat Sore throat?: No Sinus problems?: Yes  Hematologic/Lymphatic Swollen glands?: No Easy bruising?: No  Cardiovascular Leg swelling?: No Chest pain?: No  Respiratory Cough?: No Shortness of breath?: No  Endocrine Excessive thirst?: No  Musculoskeletal Back pain?: No Joint pain?: No  Neurological Headaches?: No Dizziness?: No  Psychologic Depression?: No Anxiety?: No  Physical Exam: BP (!) 150/87   Pulse 68   Ht 5\' 10"  (1.778 m)   Wt 215 lb (97.5 kg)   BMI 30.85 kg/m   Constitutional: Well  nourished. Alert and oriented, No acute distress. HEENT: [place] AT, moist mucus membranes. Trachea midline, no masses. Cardiovascular: No clubbing, cyanosis, or edema. Respiratory: Normal respiratory effort, no increased work of breathing. GI: Abdomen is soft, non tender, non distended, no abdominal masses. Liver and spleen not palpable.  No hernias appreciated.  Stool sample for occult  testing is not indicated.   GU: No CVA tenderness.  No bladder fullness or masses.  Patient with circumcised phallus.  Urethral meatus is patent.  No penile discharge. No penile lesions or rashes. Scrotum without lesions, cysts, rashes and/or edema.  Testicles are located scrotally bilaterally. No masses are appreciated in the testicles. Left and right epididymis are normal. Rectal: Patient with  normal sphincter tone. Anus and perineum without scarring or rashes. No rectal masses are appreciated. Prostate is approximately 55 grams, 86mm nodule is now appreciated in the right lobe.  Seminal vesicles are normal. Skin: No rashes, bruises or suspicious lesions. Lymph: No cervical or inguinal adenopathy. Neurologic: Grossly intact, no focal deficits, moving all 4 extremities. Psychiatric: Normal mood and affect.   Laboratory Data: Lab Results  Component Value Date   WBC 6.2 03/25/2016   HGB 16.0 03/25/2016   HCT 47.9 03/25/2016   MCV 92 03/25/2016   PLT 183 03/25/2016    Lab Results  Component Value Date   CREATININE 0.96 10/07/2016   PSA History  3.1 ng/mL on 04/05/2016  3.4 ng/mL on 03/25/2016  2.2 ng/mL on 04/07/2015  2.8 ng/mL on 02/23/2015  2.2 ng/mL on 10/10/2014  3.2 ng/mL on 12/15  2.3 ng/mL on 12/14  2.0 ng/mL on 12/13   2.3 ng/mL on 11/12             1.03 ng/mL on 11/10  I have reviewed the labs.  Assessment & Plan:    1. Prostate nodule  - appreciated on today's exam  -Explained to the patient that this may be a manifestation of prostate cancer although we cannot be sure if it is a  cancer and whether it is a low-grade or high-grade prostate cancer without a prostate biopsy gave him the option of scheduling a biopsy at this time, undergoing an MRI of the prostate at this time or based on PSA findings today we can continue to monitor at closer intervals -we discussed that if he did have prostate cancer the treatment would be geared toward a palliative nature not curative - explained the risk of ADT therapy with heart disease and osteoporosis and the side effects of fatigue and hot flashes -he would like to have a PSA drawn today and based on that value we would have further discussion for a more informed decision  - PSA drawn today  2. BPH with LUTS  - IPSS score is 2/1, it is stable  - Continue conservative management, avoiding bladder irritants and timed voiding's  - RTC based on PSA findings  3. History of hematuria  - no reports of gross hematuria    Return for pending PSA results.  Zara Council, [facility] [address], [place], [place] [address] [phone]

## 2017-04-11 ENCOUNTER — Telehealth: Payer: Self-pay | Admitting: Urology

## 2017-04-11 LAB — PSA: PROSTATE SPECIFIC AG, SERUM: 6 ng/mL — AB (ref 0.0–4.0)

## 2017-04-11 NOTE — Telephone Encounter (Signed)
-----   Message from Nori Riis, PA-C sent at 04/11/2017  7:56 AM EST ----- Spoke with Jonathan Burns and explained his PSA results.  Using the PCPT Risk calculator, I informed him that if a biopsy was performed at this time, he would have a 64% chance of having a negative result, 20% of finding a low grade cancer which would not need to be treated or a 16% high grade cancer which would be treated.  Explained that he has a 2-4% chance of having an infection after the biopsy that would result in hospitalization and/or death.  Based on this information, he would like to proceed with the biopsy at this time.  He states he is not taking any blood thinning products at this time.  Please call him today to schedule his biopsy.  He is fine with any physician and wants to have it done as soon as possible.

## 2017-04-11 NOTE — Telephone Encounter (Signed)
Patient is scheduled for 04-15-17 with [facility]

## 2017-04-14 ENCOUNTER — Telehealth: Payer: Self-pay

## 2017-04-14 ENCOUNTER — Ambulatory Visit (INDEPENDENT_AMBULATORY_CARE_PROVIDER_SITE_OTHER): Payer: Medicare Other

## 2017-04-14 VITALS — BP 132/76 | HR 65 | Temp 97.9°F | Resp 16 | Ht 65.0 in | Wt 226.3 lb

## 2017-04-14 DIAGNOSIS — Z0000 Encounter for general adult medical examination without abnormal findings: Secondary | ICD-10-CM | POA: Diagnosis not present

## 2017-04-14 NOTE — Telephone Encounter (Signed)
Can you call pt please.

## 2017-04-14 NOTE — Telephone Encounter (Signed)
error 

## 2017-04-14 NOTE — Progress Notes (Signed)
Subjective:   Jonathan Burns is a 72 y.o. male who presents for Medicare Annual/Subsequent preventive examination.1  Review of Systems:   Cardiac Risk Factors include: hypertension;advanced age (>37men, >9 women);male gender;dyslipidemia     Objective:    Vitals: BP 132/76 (BP Location: Left Arm, Patient Position: Sitting)   Pulse 65   Temp 97.9 F (36.6 C) (Temporal)   Resp 16   Ht 5\' 5"  (1.651 m)   Wt 226 lb 4.8 oz (102.6 kg)   BMI 37.66 kg/m   Body mass index is 37.66 kg/m.  Advanced Directives 04/14/2017 01/30/2017 09/28/2014  Does Patient Have a Medical Advance Directive? Yes No Yes  Type of Paramedic of [place];Living will - [place]  Does patient want to make changes to medical advance directive? - - No - Patient declined  Copy of [place] in Chart? Yes - -  Would patient like information on creating a medical advance directive? - No - Patient declined -    Tobacco Social History   Tobacco Use  Smoking Status Former Smoker  . Packs/day: 1.00  . Years: 30.00  . Pack years: 30.00  . Types: Cigarettes  . Last attempt to quit: 08/17/1986  . Years since quitting: 30.6  Smokeless Tobacco Never Used     Counseling given: Not Answered   Clinical Intake:  Pre-visit preparation completed: Yes  Pain : No/denies pain     Nutritional Status: BMI 25 -29 Overweight Nutritional Risks: None Diabetes: No  How often do you need to have someone help you when you read instructions, pamphlets, or other written materials from your doctor or pharmacy?: 1 - Never What is the last grade level you completed in school?: 2 years college   Interpreter Needed?: No  Information entered by :: Tiffany Hill,LPN   Past Medical History:  Diagnosis Date  . Anemia   . BPH (benign prostatic hypertrophy)   . Chronic GI bleeding   . COPD (chronic obstructive pulmonary disease) ([place])   . Hyperlipidemia   .  Hypertension   . Prostate nodule   . PUD (peptic ulcer disease)   . Rising PSA level    Past Surgical History:  Procedure Laterality Date  . COLONOSCOPY  2015  . ESOPHAGOGASTRODUODENOSCOPY  2015   Family History  Problem Relation Age of Onset  . Cancer Mother        breast  . Diabetes Mother   . Aneurysm Father   . Kidney disease Neg Hx   . Prostate cancer Neg Hx   . Kidney cancer Neg Hx    Social History   Socioeconomic History  . Marital status: Married    Spouse name: None  . Number of children: None  . Years of education: None  . Highest education level: None  Social Needs  . Financial resource strain: Not hard at all  . Food insecurity - worry: Never true  . Food insecurity - inability: Never true  . Transportation needs - medical: No  . Transportation needs - non-medical: No  Occupational History  . None  Tobacco Use  . Smoking status: Former Smoker    Packs/day: 1.00    Years: 30.00    Pack years: 30.00    Types: Cigarettes    Last attempt to quit: 08/17/1986    Years since quitting: 30.6  . Smokeless tobacco: Never Used  Substance and Sexual Activity  . Alcohol use: Yes    Alcohol/week: 0.0  oz    Comment: occasional  . Drug use: No  . Sexual activity: None  Other Topics Concern  . None  Social History Narrative  . None    Outpatient Encounter Medications as of 04/14/2017  Medication Sig  . amLODipine (NORVASC) 10 MG tablet Take 1 tablet (10 mg total) by mouth daily.  . carvedilol (COREG) 25 MG tablet Take 1 tablet (25 mg total) by mouth 2 (two) times daily with a meal.  . Cyanocobalamin (B-12) 2500 MCG TABS Take by mouth.  . Ferrous Sulfate (IRON) 325 (65 FE) MG TABS Take 1 tablet by mouth daily. (Patient taking differently: Take 2 tablets by mouth daily. )  . L-METHYLFOLATE CALCIUM PO Take by mouth.  Marland Kitchen LORazepam (ATIVAN) 1 MG tablet Take 1 tablet (1 mg total) by mouth daily as needed for anxiety.  Marland Kitchen losartan (COZAAR) 100 MG tablet Take 1 tablet  (100 mg total) by mouth daily.  . Multiple Vitamins-Minerals (MENS MULTIVITAMIN PLUS PO) Take by mouth daily.  . Omega-3 Fatty Acids (FISH OIL) 1000 MG CAPS Take by mouth.  . pantoprazole (PROTONIX) 40 MG tablet Take 1 tablet (40 mg total) by mouth daily.  Marland Kitchen albuterol (PROAIR HFA) 108 (90 Base) MCG/ACT inhaler Inhale into the lungs. Reported on 04/12/2015   No facility-administered encounter medications on file as of 04/14/2017.     Activities of Daily Living In your present state of health, do you have any difficulty performing the following activities: 04/14/2017  Hearing? N  Vision? N  Difficulty concentrating or making decisions? N  Walking or climbing stairs? N  Dressing or bathing? N  Doing errands, shopping? N  Preparing Food and eating ? N  Using the Toilet? N  In the past six months, have you accidently leaked urine? N  Do you have problems with loss of bowel control? N  Managing your Medications? N  Managing your Finances? N  Housekeeping or managing your Housekeeping? N  Some recent data might be hidden    Patient Care Team: Guadalupe Maple, MD as PCP - General (Family Medicine) Hollice Espy, MD as Consulting Physician (Urology) Josefine Class, MD as Referring Physician (Gastroenterology)   Assessment:   This is a routine wellness examination for Jonathan Burns.  Exercise Activities and Dietary recommendations Current Exercise Habits: Home exercise routine, Type of exercise: walking, Time (Minutes): 20, Frequency (Times/Week): 4, Weekly Exercise (Minutes/Week): 80, Intensity: Mild, Exercise limited by: None identified  Goals    . DIET - INCREASE WATER INTAKE     Recommend drinking at least 6-8 glasses of water a day       Fall Risk Fall Risk  04/14/2017 10/07/2016 03/25/2016 02/23/2015 02/10/2015  Falls in the past year? No No No No No   Is the patient's home free of loose throw rugs in walkways, pet beds, electrical cords, etc?   yes      Grab bars in the bathroom?  no      Handrails on the stairs?   yes      Adequate lighting?   yes  Timed Get Up and Go Performed: Completed in 8 seconds with no use of assistive devices, steady gait. No intervention needed at this time.   Depression Screen PHQ 2/9 Scores 04/14/2017 10/07/2016 03/25/2016 02/23/2015  PHQ - 2 Score 0 0 0 0  PHQ- 9 Score 1 - - -    Cognitive Function     6CIT Screen 04/14/2017  What Year? 0 points  What month? 0  points  What time? 0 points  Count back from 20 0 points  Months in reverse 0 points  Repeat phrase 0 points  Total Score 0    Immunization History  Administered Date(s) Administered  . Influenza,inj,Quad PF,6+ Mos 11/22/2016  . Influenza-Unspecified 02/15/2014, 11/29/2014, 12/11/2015  . Pneumococcal Conjugate-13 02/15/2014  . Pneumococcal Polysaccharide-23 12/19/2005, 09/20/2015  . Td 11/20/2004  . Tdap 09/20/2015  . Zoster 08/06/2011    Qualifies for Shingles Vaccine? Yes, discussed shingrix   Screening Tests Health Maintenance  Topic Date Due  . COLONOSCOPY  03/17/2023  . TETANUS/TDAP  09/19/2025  . INFLUENZA VACCINE  Completed  . Hepatitis C Screening  Completed  . PNA vac Low Risk Adult  Completed   Cancer Screenings: Lung: Low Dose CT Chest recommended if Age 13-80 years, 30 pack-year currently smoking OR have quit w/in 15years. Patient does not qualify. Colorectal: completed 03/16/2013  Additional Screenings:  Hepatitis B/HIV/Syphillis: not indicated Hepatitis C Screening: completed 03/05/2015    Plan:     I have personally reviewed and addressed the Medicare Annual Wellness questionnaire and have noted the following in the patient's chart:  A. Medical and social history B. Use of alcohol, tobacco or illicit drugs  C. Current medications and supplements D. Functional ability and status E.  Nutritional status F.  Physical activity G. Advance directives H. List of other physicians I.  Hospitalizations, surgeries, and ER visits in previous 12  months J.  [place] such as hearing and vision if needed, cognitive and depression L. Referrals and appointments   In addition, I have reviewed and discussed with patient certain preventive protocols, quality metrics, and best practice recommendations. A written personalized care plan for preventive services as well as general preventive health recommendations were provided to patient.   Signed,  Tyler Aas, LPN Nurse Health Advisor   Nurse Notes:none

## 2017-04-14 NOTE — Patient Instructions (Signed)
[name] , Thank you for taking time to come for your Medicare Wellness Visit. I appreciate your ongoing commitment to your health goals. Please review the following plan we discussed and let me know if I can assist you in the future.   Screening recommendations/referrals: Colonoscopy: completed 03/16/2013 Recommended yearly ophthalmology/optometry visit for glaucoma screening and checkup Recommended yearly dental visit for hygiene and checkup  Vaccinations: Influenza vaccine: up to date Pneumococcal vaccine: up to date Tdap vaccine: up to date Shingles vaccine: completed zostavax, shingrix eligible  Advanced directives: copy on file.   Conditions/risks identified: Recommend drinking at least 6-8 glasses of water a day  Next appointment: Follow up on 05/27/2017 at 10:00am with Dr.Crissman. Follow up in one year for your annual wellness exam.   Preventive Care 74 Years and Older, Male Preventive care refers to lifestyle choices and visits with your health care provider that can promote health and wellness. What does preventive care include?  A yearly physical exam. This is also called an annual well check.  Dental exams once or twice a year.  Routine eye exams. Ask your health care provider how often you should have your eyes checked.  Personal lifestyle choices, including:  Daily care of your teeth and gums.  Regular physical activity.  Eating a healthy diet.  Avoiding tobacco and drug use.  Limiting alcohol use.  Practicing safe sex.  Taking low doses of aspirin every day.  Taking vitamin and mineral supplements as recommended by your health care provider. What happens during an annual well check? The services and screenings done by your health care provider during your annual well check will depend on your age, overall health, lifestyle risk factors, and family history of disease. Counseling  Your health care provider may ask you questions about your:  Alcohol  use.  Tobacco use.  Drug use.  Emotional well-being.  Home and relationship well-being.  Sexual activity.  Eating habits.  History of falls.  Memory and ability to understand (cognition).  Work and work Statistician. Screening  You may have the following tests or measurements:  Height, weight, and BMI.  Blood pressure.  Lipid and cholesterol levels. These may be checked every 5 years, or more frequently if you are over 35 years old.  Skin check.  Lung cancer screening. You may have this screening every year starting at age 55 if you have a 30-pack-year history of smoking and currently smoke or have quit within the past 15 years.  Fecal occult blood test (FOBT) of the stool. You may have this test every year starting at age 50.  Flexible sigmoidoscopy or colonoscopy. You may have a sigmoidoscopy every 5 years or a colonoscopy every 10 years starting at age 39.  Prostate cancer screening. Recommendations will vary depending on your family history and other risks.  Hepatitis C blood test.  Hepatitis B blood test.  Sexually transmitted disease (STD) testing.  Diabetes screening. This is done by checking your blood sugar (glucose) after you have not eaten for a while (fasting). You may have this done every 1-3 years.  Abdominal aortic aneurysm (AAA) screening. You may need this if you are a current or former smoker.  Osteoporosis. You may be screened starting at age 8 if you are at high risk. Talk with your health care provider about your test results, treatment options, and if necessary, the need for more tests. Vaccines  Your health care provider may recommend certain vaccines, such as:  Influenza vaccine. This is recommended  every year.  Tetanus, diphtheria, and acellular pertussis (Tdap, Td) vaccine. You may need a Td booster every 10 years.  Zoster vaccine. You may need this after age 50.  Pneumococcal 13-valent conjugate (PCV13) vaccine. One dose is  recommended after age 14.  Pneumococcal polysaccharide (PPSV23) vaccine. One dose is recommended after age 61. Talk to your health care provider about which screenings and vaccines you need and how often you need them. This information is not intended to replace advice given to you by your health care provider. Make sure you discuss any questions you have with your health care provider. Document Released: 03/03/2015 Document Revised: 10/25/2015 Document Reviewed: 12/06/2014 Elsevier Interactive Patient Education  2017 [place] Prevention in the Home Falls can cause injuries. They can happen to people of all ages. There are many things you can do to make your home safe and to help prevent falls. What can I do on the outside of my home?  Regularly fix the edges of walkways and driveways and fix any cracks.  Remove anything that might make you trip as you walk through a door, such as a raised step or threshold.  Trim any bushes or trees on the path to your home.  Use bright outdoor lighting.  Clear any walking paths of anything that might make someone trip, such as rocks or tools.  Regularly check to see if handrails are loose or broken. Make sure that both sides of any steps have handrails.  Any raised decks and porches should have guardrails on the edges.  Have any leaves, snow, or ice cleared regularly.  Use sand or salt on walking paths during winter.  Clean up any spills in your garage right away. This includes oil or grease spills. What can I do in the bathroom?  Use night lights.  Install grab bars by the toilet and in the tub and shower. Do not use towel bars as grab bars.  Use non-skid mats or decals in the tub or shower.  If you need to sit down in the shower, use a plastic, non-slip stool.  Keep the floor dry. Clean up any water that spills on the floor as soon as it happens.  Remove soap buildup in the tub or shower regularly.  Attach bath mats  securely with double-sided non-slip rug tape.  Do not have throw rugs and other things on the floor that can make you trip. What can I do in the bedroom?  Use night lights.  Make sure that you have a light by your bed that is easy to reach.  Do not use any sheets or blankets that are too big for your bed. They should not hang down onto the floor.  Have a firm chair that has side arms. You can use this for support while you get dressed.  Do not have throw rugs and other things on the floor that can make you trip. What can I do in the kitchen?  Clean up any spills right away.  Avoid walking on wet floors.  Keep items that you use a lot in easy-to-reach places.  If you need to reach something above you, use a strong step stool that has a grab bar.  Keep electrical cords out of the way.  Do not use floor polish or wax that makes floors slippery. If you must use wax, use non-skid floor wax.  Do not have throw rugs and other things on the floor that can make you trip. What  can I do with my stairs?  Do not leave any items on the stairs.  Make sure that there are handrails on both sides of the stairs and use them. Fix handrails that are broken or loose. Make sure that handrails are as long as the stairways.  Check any carpeting to make sure that it is firmly attached to the stairs. Fix any carpet that is loose or worn.  Avoid having throw rugs at the top or bottom of the stairs. If you do have throw rugs, attach them to the floor with carpet tape.  Make sure that you have a light switch at the top of the stairs and the bottom of the stairs. If you do not have them, ask someone to add them for you. What else can I do to help prevent falls?  Wear shoes that:  Do not have high heels.  Have rubber bottoms.  Are comfortable and fit you well.  Are closed at the toe. Do not wear sandals.  If you use a stepladder:  Make sure that it is fully opened. Do not climb a closed  stepladder.  Make sure that both sides of the stepladder are locked into place.  Ask someone to hold it for you, if possible.  Clearly mark and make sure that you can see:  Any grab bars or handrails.  First and last steps.  Where the edge of each step is.  Use tools that help you move around (mobility aids) if they are needed. These include:  Canes.  Walkers.  Scooters.  Crutches.  Turn on the lights when you go into a dark area. Replace any light bulbs as soon as they burn out.  Set up your furniture so you have a clear path. Avoid moving your furniture around.  If any of your floors are uneven, fix them.  If there are any pets around you, be aware of where they are.  Review your medicines with your doctor. Some medicines can make you feel dizzy. This can increase your chance of falling. Ask your doctor what other things that you can do to help prevent falls. This information is not intended to replace advice given to you by your health care provider. Make sure you discuss any questions you have with your health care provider. Document Released: 12/01/2008 Document Revised: 07/13/2015 Document Reviewed: 03/11/2014 Elsevier Interactive Patient Education  2017 [employer].

## 2017-04-15 ENCOUNTER — Ambulatory Visit (INDEPENDENT_AMBULATORY_CARE_PROVIDER_SITE_OTHER): Payer: Medicare Other | Admitting: Urology

## 2017-04-15 ENCOUNTER — Encounter: Payer: Medicare Other | Admitting: Family Medicine

## 2017-04-15 ENCOUNTER — Encounter: Payer: Self-pay | Admitting: Urology

## 2017-04-15 VITALS — BP 147/81 | HR 73 | Ht 65.0 in | Wt 221.5 lb

## 2017-04-15 DIAGNOSIS — N402 Nodular prostate without lower urinary tract symptoms: Secondary | ICD-10-CM | POA: Diagnosis not present

## 2017-04-15 MED ORDER — LIDOCAINE HCL 2 % EX GEL
1.0000 "application " | Freq: Once | CUTANEOUS | Status: AC
  Administered 2017-04-15: 1 via URETHRAL

## 2017-04-15 MED ORDER — LEVOFLOXACIN 500 MG PO TABS
500.0000 mg | ORAL_TABLET | Freq: Once | ORAL | Status: AC
  Administered 2017-04-15: 500 mg via ORAL

## 2017-04-15 MED ORDER — GENTAMICIN SULFATE 40 MG/ML IJ SOLN
80.0000 mg | Freq: Once | INTRAMUSCULAR | Status: AC
  Administered 2017-04-15: 80 mg via INTRAMUSCULAR

## 2017-04-15 NOTE — Progress Notes (Signed)
Prostate Biopsy Procedure   Informed consent was obtained after discussing risks/benefits of the procedure.  A time out was performed to ensure correct patient identity.  Pre-Procedure: - Last PSA Level: 6.0 04/10/2017 - Gentamicin given prophylactically - Levaquin 500 mg administered PO -Transrectal Ultrasound performed revealing a 30 gm prostate -No significant hypoechoic or median lobe noted  Procedure: - Prostate block performed using 10 cc 1% lidocaine and biopsies taken from sextant areas, a total of 12 under ultrasound guidance and one additional digital directed biopsy of the right prostate nodule.  Post-Procedure: - Patient tolerated the procedure well - He was counseled to seek immediate medical attention if experiences any severe pain, significant bleeding, or fevers - Return in one week to discuss biopsy results

## 2017-04-22 ENCOUNTER — Other Ambulatory Visit: Payer: Self-pay | Admitting: Urology

## 2017-04-22 LAB — PATHOLOGY REPORT

## 2017-04-29 ENCOUNTER — Ambulatory Visit (INDEPENDENT_AMBULATORY_CARE_PROVIDER_SITE_OTHER): Payer: Medicare Other | Admitting: Urology

## 2017-04-29 ENCOUNTER — Encounter: Payer: Self-pay | Admitting: Urology

## 2017-04-29 VITALS — BP 161/89 | HR 85 | Ht 70.0 in | Wt 224.0 lb

## 2017-04-29 DIAGNOSIS — C61 Malignant neoplasm of prostate: Secondary | ICD-10-CM

## 2017-04-29 NOTE — Progress Notes (Signed)
04/29/2017 2:35 PM   [name] [birth year] [id number]  Referring provider: Guadalupe Maple, MD [address] [place], [place] [address]  Chief Complaint  Patient presents with  . Results    HPI: 72 year old male presents for prostate biopsy follow-up.  Biopsy was performed on 04/15/2017 for a PSA of 6.0.  He also had a right prostate nodule.  He had no post biopsy problems and has no complaints today.  Prostate volume was calculated at 30 g.  Standard 12 core biopsies along with an additional digitally directed biopsy of the right prostate nodule were performed.      Pathology: Refer to the printed report for details. 6/6 cores from the left prostate were positive for Gleason 3+3 adenocarcinoma involving from 28-92% of the submitted tissue.  6/6 cores were positive from the right prostate.  The right mid, right apex and right lateral base showed Gleason 4+3 adenocarcinoma involving from 29-76% of submitted tissue.  The right lateral apex showed Gleason 3+4 adenocarcinoma involving 20% of the submitted tissue.  The right lateral mid core showed Gleason 4+4 adenocarcinoma involving 8% of the submitted tissue; the right base showed Gleason 3+3 adenocarcinoma involving 3%.   PMH: Past Medical History:  Diagnosis Date  . Anemia   . BPH (benign prostatic hypertrophy)   . Chronic GI bleeding   . COPD (chronic obstructive pulmonary disease) ([place])   . Hyperlipidemia   . Hypertension   . Prostate nodule   . PUD (peptic ulcer disease)   . Rising PSA level     Surgical History: Past Surgical History:  Procedure Laterality Date  . COLONOSCOPY  2015  . ESOPHAGOGASTRODUODENOSCOPY  2015    Home Medications:  Allergies as of 04/29/2017   No Known Allergies     Medication List        Accurate as of 04/29/17  2:35 PM. Always use your most recent med list.          amLODipine 10 MG tablet Commonly known as:  NORVASC Take 1 tablet (10 mg total) by mouth daily.   B-12 2500 MCG  Tabs Take by mouth.   carvedilol 25 MG tablet Commonly known as:  COREG Take 1 tablet (25 mg total) by mouth 2 (two) times daily with a meal.   Fish Oil 1000 MG Caps Take by mouth.   Iron 325 (65 Fe) MG Tabs Take 1 tablet by mouth daily.   L-METHYLFOLATE CALCIUM PO Take by mouth.   LORazepam 1 MG tablet Commonly known as:  ATIVAN Take 1 tablet (1 mg total) by mouth daily as needed for anxiety.   losartan 100 MG tablet Commonly known as:  COZAAR Take 1 tablet (100 mg total) by mouth daily.   MENS MULTIVITAMIN PLUS PO Take by mouth daily.   pantoprazole 40 MG tablet Commonly known as:  PROTONIX Take 1 tablet (40 mg total) by mouth daily.   PROAIR HFA 108 (90 Base) MCG/ACT inhaler Generic drug:  albuterol Inhale into the lungs. Reported on 04/12/2015       Allergies: No Known Allergies  Family History: Family History  Problem Relation Age of Onset  . Cancer Mother        breast  . Diabetes Mother   . Aneurysm Father   . Kidney disease Neg Hx   . Prostate cancer Neg Hx   . Kidney cancer Neg Hx     Social History:  reports that he quit smoking about 30 years ago. His smoking  use included cigarettes. He has a 30.00 pack-year smoking history. he has never used smokeless tobacco. He reports that he drinks alcohol. He reports that he does not use drugs.  ROS: UROLOGY Frequent Urination?: No Hard to postpone urination?: No Burning/pain with urination?: No Get up at night to urinate?: No Leakage of urine?: No Urine stream starts and stops?: No Trouble starting stream?: No Do you have to strain to urinate?: No Blood in urine?: No Urinary tract infection?: No Sexually transmitted disease?: No Injury to kidneys or bladder?: No Painful intercourse?: No Weak stream?: No Erection problems?: No Penile pain?: No  Gastrointestinal Nausea?: No Vomiting?: No Indigestion/heartburn?: No Diarrhea?: No Constipation?: No  Constitutional Fever: No Night sweats?:  No Weight loss?: No Fatigue?: No  Skin Skin rash/lesions?: No Itching?: No  Eyes Blurred vision?: No Double vision?: No  Ears/Nose/Throat Sore throat?: No Sinus problems?: No  Hematologic/Lymphatic Swollen glands?: No Easy bruising?: Yes  Cardiovascular Leg swelling?: No Chest pain?: No  Respiratory Cough?: No Shortness of breath?: No  Endocrine Excessive thirst?: No  Musculoskeletal Back pain?: No Joint pain?: No  Neurological Headaches?: No Dizziness?: No  Psychologic Depression?: No Anxiety?: No  Physical Exam: BP (!) 161/89   Pulse 85   Ht 5\' 10"  (1.778 m)   Wt 224 lb (101.6 kg)   BMI 32.14 kg/m    Constitutional:  Alert and oriented, No acute distress.   Laboratory Data: Lab Results  Component Value Date   WBC 6.2 03/25/2016   HGB 16.0 03/25/2016   HCT 47.9 03/25/2016   MCV 92 03/25/2016   PLT 183 03/25/2016    Lab Results  Component Value Date   CREATININE 0.96 10/07/2016    Assessment & Plan:    1. Prostate cancer [facility]) 72 year old male with clinical stage T2 Nx Mx adenocarcinoma prostate, high risk. The pathology report was discussed in detail with Mr. Botts and his wife.  Based on the presence of 4+4 disease will schedule a CT of the abdomen and pelvis and bone scan.  They will return for a follow-up appointment.  I did discuss potential curative treatment options including radiation modalities and robotic assisted laparoscopic prostatectomy.  He was informed he would have a higher risk of extracapsular disease with surgery.  He was provided literature on prostate cancer diagnosis and treatment. If his radiologic studies show no evidence of metastatic disease he is interested in surgery.  Will schedule a follow-up appointment with Dr. Erlene Quan  For further discussion.  Greater than 50% of this 15-minute visit was spent in counseling.    Abbie Sons, [place] [address] [place], [place] [address] [phone]

## 2017-05-08 ENCOUNTER — Ambulatory Visit
Admission: RE | Admit: 2017-05-08 | Discharge: 2017-05-08 | Disposition: A | Discharge status: Home / Self Care | Payer: Medicare Other | Source: Ambulatory Visit | Attending: Urology | Admitting: Urology

## 2017-05-08 ENCOUNTER — Encounter
Admission: RE | Admit: 2017-05-08 | Discharge: 2017-05-08 | Disposition: A | Discharge status: Home / Self Care | Payer: Medicare Other | Source: Ambulatory Visit | Attending: Urology | Admitting: Urology

## 2017-05-08 DIAGNOSIS — K4020 Bilateral inguinal hernia, without obstruction or gangrene, not specified as recurrent: Secondary | ICD-10-CM | POA: Insufficient documentation

## 2017-05-08 DIAGNOSIS — C61 Malignant neoplasm of prostate: Secondary | ICD-10-CM

## 2017-05-08 DIAGNOSIS — K8020 Calculus of gallbladder without cholecystitis without obstruction: Secondary | ICD-10-CM | POA: Insufficient documentation

## 2017-05-08 DIAGNOSIS — K4090 Unilateral inguinal hernia, without obstruction or gangrene, not specified as recurrent: Secondary | ICD-10-CM | POA: Diagnosis not present

## 2017-05-08 LAB — POCT I-STAT CREATININE: Creatinine, Ser: 1 mg/dL (ref 0.61–1.24)

## 2017-05-08 MED ORDER — TECHNETIUM TC 99M MEDRONATE IV KIT
25.0000 | PACK | Freq: Once | INTRAVENOUS | Status: AC | PRN
  Administered 2017-05-08: 23.2 via INTRAVENOUS

## 2017-05-08 MED ORDER — IOPAMIDOL (ISOVUE-300) INJECTION 61%
100.0000 mL | Freq: Once | INTRAVENOUS | Status: AC | PRN
  Administered 2017-05-08: 100 mL via INTRAVENOUS

## 2017-05-09 ENCOUNTER — Other Ambulatory Visit: Payer: Self-pay | Admitting: Family Medicine

## 2017-05-09 DIAGNOSIS — D582 Other hemoglobinopathies: Secondary | ICD-10-CM

## 2017-05-09 DIAGNOSIS — I10 Essential (primary) hypertension: Secondary | ICD-10-CM

## 2017-05-12 ENCOUNTER — Telehealth: Payer: Self-pay

## 2017-05-12 NOTE — Telephone Encounter (Signed)
-----   Message from Abbie Sons, MD sent at 05/11/2017 11:11 AM EDT ----- CT and bone scan showed no evidence of metastatic disease.  Keep follow-up with Dr. Erlene Quan to discuss surgery.  We can also make an appointment in radiation oncology if he desires.

## 2017-05-12 NOTE — Telephone Encounter (Signed)
Spoke with pt in reference to imaging results and f/u with Dr. Erlene Quan. Pt stated that he would prefer to wait on radiation oncology at this time.

## 2017-05-20 ENCOUNTER — Ambulatory Visit (INDEPENDENT_AMBULATORY_CARE_PROVIDER_SITE_OTHER): Payer: Medicare Other | Admitting: Urology

## 2017-05-20 ENCOUNTER — Encounter: Payer: Self-pay | Admitting: Urology

## 2017-05-20 VITALS — BP 142/78 | HR 75 | Ht 70.0 in | Wt 220.0 lb

## 2017-05-20 DIAGNOSIS — C61 Malignant neoplasm of prostate: Secondary | ICD-10-CM | POA: Diagnosis not present

## 2017-05-20 NOTE — Progress Notes (Signed)
05/20/2017 8:07 PM   Jonathan Burns Jonathan Burns [birth year] [id number]  Referring provider: Guadalupe Maple, MD [address] [place], [place] [address]  Chief Complaint  Patient presents with  . Prostate Cancer    HPI: 72 year old male referred for surgical opinion from Dr. Bernardo Heater regarding newly diagnosed high risk prostate cancer.  He initially underwent a biopsy for elevated PSA of 6.0 on 04/15/2017.  He also had a right-sided prostate nodule.  TRUS volume 30 cc.  Prostate biopsy revealed 12 of 12 cores positive including lesions up to Gleason 4+4 involving 80% of the tissue on the right lateral mid core.  Further staging including CT scan and bone scan performed on 05/09/2017 shows no obvious metastatic disease.  He does have bilateral fat-containing inguinal hernias.   Bone scan showed several areas of abnormal uptake presumably related to arthritis rather than evidence of metastatic disease.  He has minimal baseline voiding symptoms.  He does have multiple medical comorbidities including a history of COPD, hypertension, hyperlipidemia amongst others.  He is also obese.   IPSS    Row Name 04/10/17 0800         International Prostate Symptom Score   How often have you had the sensation of not emptying your bladder?  Not at All     How often have you had to urinate less than every two hours?  Less than 1 in 5 times     How often have you found you stopped and started again several times when you urinated?  Not at All     How often have you found it difficult to postpone urination?  Not at All     How often have you had a weak urinary stream?  Less than 1 in 5 times     How often have you had to strain to start urination?  Not at All     How many times did you typically get up at night to urinate?  None     Total IPSS Score  2       Quality of Life due to urinary symptoms   If you were to spend the rest of your life with your urinary condition just the way it is now how would you feel  about that?  Pleased        Score:  1-7 Mild 8-19 Moderate 20-35 Severe   PMH: Past Medical History:  Diagnosis Date  . Anemia   . BPH (benign prostatic hypertrophy)   . Chronic GI bleeding   . COPD (chronic obstructive pulmonary disease) ([place])   . Hyperlipidemia   . Hypertension   . Prostate nodule   . PUD (peptic ulcer disease)   . Rising PSA level     Surgical History: Past Surgical History:  Procedure Laterality Date  . COLONOSCOPY  2015  . ESOPHAGOGASTRODUODENOSCOPY  2015    Home Medications:  Allergies as of 05/20/2017   No Known Allergies     Medication List        Accurate as of 05/20/17  8:07 PM. Always use your most recent med list.          amLODipine 10 MG tablet Commonly known as:  NORVASC Take 1 tablet (10 mg total) by mouth daily.   B-12 2500 MCG Tabs Take by mouth.   carvedilol 25 MG tablet Commonly known as:  COREG Take 1 tablet (25 mg total) by mouth 2 (two) times daily with a meal.   Fish Oil  1000 MG Caps Take by mouth.   Iron 325 (65 Fe) MG Tabs Take 1 tablet by mouth daily.   L-METHYLFOLATE CALCIUM PO Take by mouth.   LORazepam 1 MG tablet Commonly known as:  ATIVAN Take 1 tablet (1 mg total) by mouth daily as needed for anxiety.   losartan 100 MG tablet Commonly known as:  COZAAR Take 1 tablet (100 mg total) by mouth daily.   MENS MULTIVITAMIN PLUS PO Take by mouth daily.   pantoprazole 40 MG tablet Commonly known as:  PROTONIX Take 1 tablet (40 mg total) by mouth daily.   PROAIR HFA 108 (90 Base) MCG/ACT inhaler Generic drug:  albuterol Inhale into the lungs. Reported on 04/12/2015       Allergies: No Known Allergies  Family History: Family History  Problem Relation Age of Onset  . Cancer Mother        breast  . Diabetes Mother   . Aneurysm Father   . Kidney disease Neg Hx   . Prostate cancer Neg Hx   . Kidney cancer Neg Hx     Social History:  reports that he quit smoking about 30 years ago. His  smoking use included cigarettes. He has a 30.00 pack-year smoking history. He has never used smokeless tobacco. He reports that he drinks alcohol. He reports that he does not use drugs.  ROS: UROLOGY Frequent Urination?: No Hard to postpone urination?: No Burning/pain with urination?: No Get up at night to urinate?: No Leakage of urine?: No Urine stream starts and stops?: No Trouble starting stream?: No Do you have to strain to urinate?: No Blood in urine?: No Urinary tract infection?: No Sexually transmitted disease?: No Injury to kidneys or bladder?: No Painful intercourse?: No Weak stream?: No Erection problems?: No Penile pain?: No  Gastrointestinal Nausea?: No Vomiting?: No Indigestion/heartburn?: No Diarrhea?: No Constipation?: No  Constitutional Fever: No Night sweats?: No Weight loss?: No Fatigue?: No  Skin Skin rash/lesions?: No Itching?: No  Eyes Blurred vision?: No Double vision?: No  Ears/Nose/Throat Sore throat?: No Sinus problems?: No  Hematologic/Lymphatic Swollen glands?: No Easy bruising?: No  Cardiovascular Leg swelling?: No Chest pain?: No  Respiratory Cough?: No Shortness of breath?: No  Endocrine Excessive thirst?: No  Musculoskeletal Back pain?: No Joint pain?: No  Neurological Headaches?: No Dizziness?: No  Psychologic Depression?: No Anxiety?: No  Physical Exam: BP (!) 142/78   Pulse 75   Ht 5\' 10"  (1.778 m)   Wt 220 lb (99.8 kg)   BMI 31.57 kg/m   Constitutional:  Alert and oriented, No acute distress.  Accompanied by wife today. HEENT: [place] AT, moist mucus membranes.  Trachea midline, no masses. Respiratory: Normal respiratory effort, no increased work of breathing. GI: Abdomen is soft, nontender, nondistended, no abdominal masses, obese. Neurologic: Grossly intact, no focal deficits, moving all 4 extremities. Psychiatric: Normal mood and affect.  Laboratory Data: Lab Results  Component Value Date   WBC  6.2 03/25/2016   HGB 16.0 03/25/2016   HCT 47.9 03/25/2016   MCV 92 03/25/2016   PLT 183 03/25/2016    Lab Results  Component Value Date   CREATININE 1.00 05/08/2017    Urinalysis N/a   Pertinent Imaging: CT scan and bone scan from 05/09/2017 personally reviewed today.  Findings as above.  Assessment & Plan:   72 year old male with multiple medical comorbidities recently diagnosed with high risk prostate cancer without obvious metastatic disease.  The patient was counseled about the natural history of prostate cancer  and the standard treatment options that are available for prostate cancer. It was explained to him how his age and life expectancy, clinical stage, Gleason score, and PSA affect his prognosis, the decision to proceed with additional staging studies, as well as how that information influences recommended treatment strategies. We discussed the roles for active surveillance, radiation therapy, surgical therapy, androgen deprivation, as well as ablative therapy options for the treatment of prostate cancer as appropriate to his individual cancer situation. We discussed the risks and benefits of these options with regard to their impact on cancer control and also in terms of potential adverse events, complications, and impact on quality of life particularly related to urinary, bowel, and sexual function. The patient was encouraged to ask questions throughout the discussion today and all questions were answered to his stated satisfaction.   We discussed surgical therapy for prostate cancer including the different available surgical approaches. We discussed, in detail, the risks and expectations of surgery with regard to cancer control, urinary control, and erectile dysfunction as well as expected post operative cover he processed. Additional risks of surgery including but not limalited to bleeding, infection, hernia formation, nerve damage, steel formation, bowel/rect injury, potentially  necessitating colostomy, damage to the urinary tract resulting in urinary leakage, urethral stricture, and cardiopulmonary risk such as myocardial infarction, stroke, death, thromboembolism etc. were explained. The risk of open surgical conversion for robotics/laparoscopic prostatectomy is also discussed.  MSK nomogram was also reviewed today.  Given his high risk disease and high volume, he has a considerable likelihood of extracapsular extension/locally advanced disease possibly with micrometastatic lymph node disease.  It is highly likely that he would need adjuvant or salvage therapy based on his pathology results.  In addition, from case series reports, it is known that patients over the age of 12 especially comorbidities and obesity tend to have increased risk of postoperative incontinence and decreased quality of life.  As such, I strongly recommended consideration of external beam radiation with 2-3 years of androgen deprivation therapy as primary treatment.  We discussed that overall oncologic outcomes are comparable to prostatectomy.  He will seen and evaluated by Dr. Baruch Gouty.  If he is not administered Lupron at Dr. Olena Leatherwood office, he can come here with ADT to be administered q. 6 months.  We also discussed side effects from Lupron today including cardiovascular compromise, increased central obesity, decreased muscle mass, loss of cortical bone loss, hot flashes, amongst others.Discussed importance of bone health on ADT, recommend 1000-1200 mg daily calcium suppliment and [phone] IU vit D daily.  Also encouraged weight being exercises and cardiovascular health.  He will let us know how he would like to proceed.    Hollice Espy, MD  [facility] [address] [place], [place] [address] [phone]  I spent 40 min with this patient of which greater than 50% was spent in counseling and coordination of care with the patient.

## 2017-05-20 NOTE — Patient Instructions (Signed)
Discussed importance of bone health on ADT, recommend 1000-1200 mg daily calcium suppliment and [phone] IU vit D daily.  Also encouraged weight being exercises and cardiovascular health.

## 2017-05-26 ENCOUNTER — Ambulatory Visit: Payer: Medicare Other | Admitting: Urology

## 2017-05-27 ENCOUNTER — Encounter: Payer: Self-pay | Admitting: Family Medicine

## 2017-05-27 ENCOUNTER — Ambulatory Visit (INDEPENDENT_AMBULATORY_CARE_PROVIDER_SITE_OTHER): Payer: Medicare Other | Admitting: Family Medicine

## 2017-05-27 VITALS — BP 138/80 | HR 67 | Ht 70.0 in | Wt 219.0 lb

## 2017-05-27 DIAGNOSIS — K922 Gastrointestinal hemorrhage, unspecified: Secondary | ICD-10-CM

## 2017-05-27 DIAGNOSIS — Z9989 Dependence on other enabling machines and devices: Secondary | ICD-10-CM

## 2017-05-27 DIAGNOSIS — I10 Essential (primary) hypertension: Secondary | ICD-10-CM

## 2017-05-27 DIAGNOSIS — G4733 Obstructive sleep apnea (adult) (pediatric): Secondary | ICD-10-CM

## 2017-05-27 DIAGNOSIS — Z7189 Other specified counseling: Secondary | ICD-10-CM | POA: Diagnosis not present

## 2017-05-27 DIAGNOSIS — C61 Malignant neoplasm of prostate: Secondary | ICD-10-CM | POA: Insufficient documentation

## 2017-05-27 DIAGNOSIS — Z1329 Encounter for screening for other suspected endocrine disorder: Secondary | ICD-10-CM

## 2017-05-27 DIAGNOSIS — J42 Unspecified chronic bronchitis: Secondary | ICD-10-CM

## 2017-05-27 DIAGNOSIS — N401 Enlarged prostate with lower urinary tract symptoms: Secondary | ICD-10-CM

## 2017-05-27 DIAGNOSIS — N402 Nodular prostate without lower urinary tract symptoms: Secondary | ICD-10-CM

## 2017-05-27 DIAGNOSIS — E7849 Other hyperlipidemia: Secondary | ICD-10-CM | POA: Diagnosis not present

## 2017-05-27 DIAGNOSIS — N138 Other obstructive and reflux uropathy: Secondary | ICD-10-CM

## 2017-05-27 DIAGNOSIS — F419 Anxiety disorder, unspecified: Secondary | ICD-10-CM

## 2017-05-27 LAB — URINALYSIS, ROUTINE W REFLEX MICROSCOPIC
Bilirubin, UA: NEGATIVE
GLUCOSE, UA: NEGATIVE
Leukocytes, UA: NEGATIVE
NITRITE UA: NEGATIVE
PH UA: 6.5 (ref 5.0–7.5)
Protein, UA: NEGATIVE
Specific Gravity, UA: 1.01 (ref 1.005–1.030)
Urobilinogen, Ur: 0.2 mg/dL (ref 0.2–1.0)

## 2017-05-27 LAB — MICROSCOPIC EXAMINATION
BACTERIA UA: NONE SEEN
WBC, UA: NONE SEEN /hpf (ref 0–5)

## 2017-05-27 NOTE — Assessment & Plan Note (Signed)
Has not noticed any bleeding continues to take iron without problems.

## 2017-05-27 NOTE — Assessment & Plan Note (Signed)
The current medical regimen is effective;  continue present plan and medications.  

## 2017-05-27 NOTE — Progress Notes (Signed)
BP 138/80 (BP Location: Left Arm)   Pulse 67   Ht 5\' 10"  (1.778 m)   Wt 219 lb (99.3 kg)   SpO2 98%   BMI 31.42 kg/m    Subjective:    Patient ID: Jonathan Burns, male    DOB: 07/21/1945, 72 y.o.   MRN: [id number]  HPI: Jonathan Burns is a 72 y.o. male  Chief Complaint  Patient presents with  . Annual Exam  Patient getting ready later this month to start radiation therapy for prostate cancer. Otherwise doing well. Patient otherwise blood pressure on review today is doing well other readings has been elevated especially systolic will continue to observe may need to adjust medications. Anxiety doing well with as needed lorazepam. Reflux stable. Breathing also doing stable.  Relevant past medical, surgical, family and social history reviewed and updated as indicated. Interim medical history since our last visit reviewed. Allergies and medications reviewed and updated.  Review of Systems  Constitutional: Negative.   HENT: Negative.   Eyes: Negative.   Respiratory: Negative.   Cardiovascular: Negative.   Gastrointestinal: Negative.   Endocrine: Negative.   Genitourinary: Negative.   Musculoskeletal: Negative.   Skin: Negative.   Allergic/Immunologic: Negative.   Neurological: Negative.   Hematological: Negative.   Psychiatric/Behavioral: Negative.     Per HPI unless specifically indicated above     Objective:    BP 138/80 (BP Location: Left Arm)   Pulse 67   Ht 5\' 10"  (1.778 m)   Wt 219 lb (99.3 kg)   SpO2 98%   BMI 31.42 kg/m   Wt Readings from Last 3 Encounters:  05/27/17 219 lb (99.3 kg)  05/20/17 220 lb (99.8 kg)  04/29/17 224 lb (101.6 kg)    Physical Exam  Constitutional: He is oriented to person, place, and time. He appears well-developed and well-nourished.  HENT:  Head: Normocephalic.  Right Ear: External ear normal.  Left Ear: External ear normal.  Nose: Nose normal.  Eyes: Pupils are equal, round, and reactive to light. Conjunctivae and EOM are  normal.  Neck: Normal range of motion. Neck supple. No thyromegaly present.  Cardiovascular: Normal rate, regular rhythm, normal heart sounds and intact distal pulses.  Pulmonary/Chest: Effort normal and breath sounds normal.  Abdominal: Soft. Bowel sounds are normal. There is no splenomegaly or hepatomegaly.  Genitourinary:  Genitourinary Comments: Done at urology  Musculoskeletal: Normal range of motion.  Lymphadenopathy:    He has no cervical adenopathy.  Neurological: He is alert and oriented to person, place, and time. He has normal reflexes.  Skin: Skin is warm and dry.  Psychiatric: He has a normal mood and affect. His behavior is normal. Judgment and thought content normal.    Results for orders placed or performed during the hospital encounter of 05/08/17  I-STAT creatinine  Result Value Ref Range   Creatinine, Ser 1.00 0.61 - 1.24 mg/dL      Assessment & Plan:   Problem List Items Addressed This Visit      Cardiovascular and Mediastinum   Hypertension - Primary    Pressure borderline control and elevated readings and getting ready for radiation may need more medication temporarily.  Patient will notify us if blood pressure elevated and we can call in medicine to start with.      Relevant Orders   CBC with Differential/Platelet   Comprehensive metabolic panel   Urinalysis, Routine w reflex microscopic     Respiratory   COPD (chronic obstructive pulmonary disease) (  [place])    The current medical regimen is effective;  continue present plan and medications.       OSA on CPAP    Uses every night without problems        Digestive   Chronic GI bleeding    Has not noticed any bleeding continues to take iron without problems.      Relevant Orders   CBC with Differential/Platelet     Genitourinary   Prostate nodule   BPH with obstruction/lower urinary tract symptoms   Prostate cancer ([place])    Getting ready for radiation therapy later this month        Other    Hyperlipidemia    The current medical regimen is effective;  continue present plan and medications.       Relevant Orders   CBC with Differential/Platelet   Comprehensive metabolic panel   Lipid panel   Urinalysis, Routine w reflex microscopic   Acute anxiety    Patient using lorazepam appropriately only using rarely and 30 has lasted a year.      Advanced care planning/counseling discussion    A voluntary discussion about advance care planning including the explanation and discussion of advance directives was extensively discussed  with the patient.  Explanation about the health care proxy and Living will was reviewed and packet with forms with explanation of how to fill them out was given.   Time spent:  Encounter 16+ min      Individuals present: Pt        Other Visit Diagnoses    Thyroid disorder screen       Relevant Orders   TSH       Follow up plan: Return in about 6 months (around 11/26/2017) for BMP,  Lipids, ALT, AST.

## 2017-05-27 NOTE — Assessment & Plan Note (Signed)
Pressure borderline control and elevated readings and getting ready for radiation may need more medication temporarily.  Patient will notify us if blood pressure elevated and we can call in medicine to start with.

## 2017-05-27 NOTE — Assessment & Plan Note (Signed)
Patient using lorazepam appropriately only using rarely and 30 has lasted a year.

## 2017-05-27 NOTE — Assessment & Plan Note (Signed)
Getting ready for radiation therapy later this month

## 2017-05-27 NOTE — Assessment & Plan Note (Signed)
A voluntary discussion about advance care planning including the explanation and discussion of advance directives was extensively discussed  with the patient.  Explanation about the health care proxy and Living will was reviewed and packet with forms with explanation of how to fill them out was given.   Time spent:  Encounter 16+ min      Individuals present: Pt

## 2017-05-27 NOTE — Assessment & Plan Note (Signed)
Uses every night without problems

## 2017-05-28 LAB — TSH: TSH: 2.42 u[IU]/mL (ref 0.450–4.500)

## 2017-05-28 LAB — COMPREHENSIVE METABOLIC PANEL
ALBUMIN: 4.8 g/dL (ref 3.5–4.8)
ALT: 23 IU/L (ref 0–44)
AST: 20 IU/L (ref 0–40)
Albumin/Globulin Ratio: 2.1 (ref 1.2–2.2)
Alkaline Phosphatase: 79 IU/L (ref 39–117)
BUN/Creatinine Ratio: 12 (ref 10–24)
BUN: 11 mg/dL (ref 8–27)
Bilirubin Total: 0.9 mg/dL (ref 0.0–1.2)
CALCIUM: 9.7 mg/dL (ref 8.6–10.2)
CO2: 23 mmol/L (ref 20–29)
CREATININE: 0.94 mg/dL (ref 0.76–1.27)
Chloride: 102 mmol/L (ref 96–106)
GFR calc Af Amer: 93 mL/min/{1.73_m2} (ref >59)
GFR, EST NON AFRICAN AMERICAN: 80 mL/min/{1.73_m2} (ref >59)
Globulin, Total: 2.3 g/dL (ref 1.5–4.5)
Glucose: 94 mg/dL (ref 65–99)
Potassium: 4.4 mmol/L (ref 3.5–5.2)
SODIUM: 140 mmol/L (ref 134–144)
Total Protein: 7.1 g/dL (ref 6.0–8.5)

## 2017-05-28 LAB — CBC WITH DIFFERENTIAL/PLATELET
BASOS: 0 %
Basophils Absolute: 0 10*3/uL (ref 0.0–0.2)
EOS (ABSOLUTE): 0.2 10*3/uL (ref 0.0–0.4)
EOS: 4 %
HEMATOCRIT: 48.7 % (ref 37.5–51.0)
HEMOGLOBIN: 18.1 g/dL — AB (ref 13.0–17.7)
IMMATURE GRANS (ABS): 0 10*3/uL (ref 0.0–0.1)
IMMATURE GRANULOCYTES: 0 %
LYMPHS: 19 %
Lymphocytes Absolute: 1.1 10*3/uL (ref 0.7–3.1)
MCH: 34.9 pg — ABNORMAL HIGH (ref 26.6–33.0)
MCHC: 37.2 g/dL — ABNORMAL HIGH (ref 31.5–35.7)
MCV: 94 fL (ref 79–97)
Monocytes Absolute: 0.5 10*3/uL (ref 0.1–0.9)
Monocytes: 9 %
Neutrophils Absolute: 4 10*3/uL (ref 1.4–7.0)
Neutrophils: 68 %
Platelets: 178 10*3/uL (ref 150–379)
RBC: 5.19 x10E6/uL (ref 4.14–5.80)
RDW: 13.4 % (ref 12.3–15.4)
WBC: 5.8 10*3/uL (ref 3.4–10.8)

## 2017-05-28 LAB — LIPID PANEL
CHOL/HDL RATIO: 3.8 ratio (ref 0.0–5.0)
Cholesterol, Total: 205 mg/dL — ABNORMAL HIGH (ref 100–199)
HDL: 54 mg/dL (ref >39)
LDL CALC: 130 mg/dL — AB (ref 0–99)
TRIGLYCERIDES: 104 mg/dL (ref 0–149)
VLDL Cholesterol Cal: 21 mg/dL (ref 5–40)

## 2017-05-29 NOTE — Telephone Encounter (Signed)
-----   Message from Amada Kingfisher, [place] sent at 05/29/2017 12:06 PM EDT ----- Patient was transferred to provider for telephone conversation.

## 2017-05-29 NOTE — Telephone Encounter (Signed)
Phone call Discussed with patient elevated hemoglobin does not do any supplements such as testosterone.  He eats with a gas furnace which is old but has CO detectors which are on the floor.  Patient will come back for repeat CBC.

## 2017-06-03 ENCOUNTER — Other Ambulatory Visit: Payer: Medicare Other

## 2017-06-03 DIAGNOSIS — D582 Other hemoglobinopathies: Secondary | ICD-10-CM

## 2017-06-04 ENCOUNTER — Other Ambulatory Visit: Payer: Self-pay

## 2017-06-04 ENCOUNTER — Ambulatory Visit
Admission: RE | Admit: 2017-06-04 | Discharge: 2017-06-04 | Disposition: A | Discharge status: Home / Self Care | Payer: Medicare Other | Source: Ambulatory Visit | Attending: Radiation Oncology | Admitting: Radiation Oncology

## 2017-06-04 ENCOUNTER — Encounter: Payer: Self-pay | Admitting: Radiation Oncology

## 2017-06-04 VITALS — BP 142/81 | HR 58 | Temp 98.2°F | Resp 18 | Wt 221.8 lb

## 2017-06-04 DIAGNOSIS — E785 Hyperlipidemia, unspecified: Secondary | ICD-10-CM | POA: Diagnosis not present

## 2017-06-04 DIAGNOSIS — C61 Malignant neoplasm of prostate: Secondary | ICD-10-CM | POA: Diagnosis not present

## 2017-06-04 DIAGNOSIS — Z79899 Other long term (current) drug therapy: Secondary | ICD-10-CM | POA: Insufficient documentation

## 2017-06-04 DIAGNOSIS — Z87891 Personal history of nicotine dependence: Secondary | ICD-10-CM | POA: Insufficient documentation

## 2017-06-04 DIAGNOSIS — N400 Enlarged prostate without lower urinary tract symptoms: Secondary | ICD-10-CM | POA: Diagnosis not present

## 2017-06-04 DIAGNOSIS — I10 Essential (primary) hypertension: Secondary | ICD-10-CM | POA: Diagnosis not present

## 2017-06-04 DIAGNOSIS — Z8711 Personal history of peptic ulcer disease: Secondary | ICD-10-CM | POA: Insufficient documentation

## 2017-06-04 DIAGNOSIS — D649 Anemia, unspecified: Secondary | ICD-10-CM | POA: Insufficient documentation

## 2017-06-04 DIAGNOSIS — J449 Chronic obstructive pulmonary disease, unspecified: Secondary | ICD-10-CM | POA: Insufficient documentation

## 2017-06-04 DIAGNOSIS — Z809 Family history of malignant neoplasm, unspecified: Secondary | ICD-10-CM | POA: Diagnosis not present

## 2017-06-04 LAB — CBC WITH DIFFERENTIAL/PLATELET
BASOS ABS: 0 10*3/uL (ref 0.0–0.2)
BASOS: 0 %
EOS (ABSOLUTE): 0.2 10*3/uL (ref 0.0–0.4)
Eos: 4 %
HEMATOCRIT: 47 % (ref 37.5–51.0)
HEMOGLOBIN: 16.3 g/dL (ref 13.0–17.7)
Immature Grans (Abs): 0 10*3/uL (ref 0.0–0.1)
Immature Granulocytes: 0 %
LYMPHS ABS: 1.2 10*3/uL (ref 0.7–3.1)
Lymphs: 18 %
MCH: 32.1 pg (ref 26.6–33.0)
MCHC: 34.7 g/dL (ref 31.5–35.7)
MCV: 93 fL (ref 79–97)
Monocytes Absolute: 0.7 10*3/uL (ref 0.1–0.9)
Monocytes: 10 %
NEUTROS ABS: 4.3 10*3/uL (ref 1.4–7.0)
Neutrophils: 68 %
Platelets: 204 10*3/uL (ref 150–379)
RBC: 5.08 x10E6/uL (ref 4.14–5.80)
RDW: 13.9 % (ref 12.3–15.4)
WBC: 6.4 10*3/uL (ref 3.4–10.8)

## 2017-06-04 NOTE — Consult Note (Signed)
NEW PATIENT EVALUATION  Name: Jonathan Burns  MRN: [id number]  Date:   06/04/2017     DOB: 1946/02/13   This 72 y.o. male patient presents to the clinic for initial evaluation ofstage IIb (T2 1 N0 M0) adenocarcinoma the prostate Gleason score of 8 (4+4) presenting with a PSA of 6  REFERRING PHYSICIAN: Guadalupe Maple, MD  CHIEF COMPLAINT:  Chief Complaint  Patient presents with  . Prostate Cancer    Initial Evaluation    DIAGNOSIS: The encounter diagnosis was Malignant neoplasm of prostate ([place]).   PREVIOUS INVESTIGATIONS:  Bone scan and CT scans of abdomen and pelvis reviewed Pathology report reviewed Clinical notes reviewed  HPI: patient is a 72 year old male who presented with a PSA of 6.0 in February 2019. He also had a palpable right-sided prostate nodule. He underwent transrectal ultrasound-guided biopsy showing 12 of 12 cores positive foradenocarcinoma mostly Gleason 7 (4+3) although 1 core from right prostate nodules Gleason 8 (4+4). He underwent a bone scan showing arthritic changes no evidence of metastatic disease. CT scan of abdomen and pelvis also showed no evidence of extracapsular extension or pelvic lymphadenopathy. Patient has very littlelower urinary tract symptoms has nocturia 1 no frequency or urgency. He does have bilateral fat-containing inguinal hernias.patient is a past medical history with comorbidities including COPD hypertension. He is seen today for evaluation of by radiation oncology.  PLANNED TREATMENT REGIMEN: I MRT radiation therapy to prostate and pelvic nodes plus ADT therapy  PAST MEDICAL HISTORY:  has a past medical history of Anemia, BPH (benign prostatic hypertrophy), Chronic GI bleeding, COPD (chronic obstructive pulmonary disease) ([place]), Hyperlipidemia, Hypertension, Prostate nodule, PUD (peptic ulcer disease), and Rising PSA level.    PAST SURGICAL HISTORY:  Past Surgical History:  Procedure Laterality Date  . COLONOSCOPY  2015  .  ESOPHAGOGASTRODUODENOSCOPY  2015    FAMILY HISTORY: family history includes Aneurysm in his father; Cancer in his mother; Diabetes in his mother.  SOCIAL HISTORY:  reports that he quit smoking about 30 years ago. His smoking use included cigarettes. He has a 30.00 pack-year smoking history. He has never used smokeless tobacco. He reports that he drinks alcohol. He reports that he does not use drugs.  ALLERGIES: Patient has no known allergies.  MEDICATIONS:  Current Outpatient Medications  Medication Sig Dispense Refill  . albuterol (PROAIR HFA) 108 (90 Base) MCG/ACT inhaler Inhale into the lungs. Reported on 04/12/2015    . amLODipine (NORVASC) 10 MG tablet Take 1 tablet (10 mg total) by mouth daily. 90 tablet 4  . carvedilol (COREG) 25 MG tablet Take 1 tablet (25 mg total) by mouth 2 (two) times daily with a meal. 180 tablet 0  . Cyanocobalamin (B-12) 2500 MCG TABS Take by mouth.    . Ferrous Sulfate (IRON) 325 (65 FE) MG TABS Take 1 tablet by mouth daily. (Patient taking differently: Take 2 tablets by mouth daily. ) 30 each 12  . L-METHYLFOLATE CALCIUM PO Take by mouth.    Marland Kitchen LORazepam (ATIVAN) 1 MG tablet Take 1 tablet (1 mg total) by mouth daily as needed for anxiety. 30 tablet 1  . losartan (COZAAR) 100 MG tablet Take 1 tablet (100 mg total) by mouth daily. 90 tablet 4  . Multiple Vitamins-Minerals (MENS MULTIVITAMIN PLUS PO) Take by mouth daily.    . Omega-3 Fatty Acids (FISH OIL) 1000 MG CAPS Take by mouth.    . pantoprazole (PROTONIX) 40 MG tablet Take 1 tablet (40 mg total) by mouth daily. [place]  tablet 4   No current facility-administered medications for this encounter.     ECOG PERFORMANCE STATUS:  0 - Asymptomatic  REVIEW OF SYSTEMS:  Patient denies any weight loss, fatigue, weakness, fever, chills or night sweats. Patient denies any loss of vision, blurred vision. Patient denies any ringing  of the ears or hearing loss. No irregular heartbeat. Patient denies heart murmur or  history of fainting. Patient denies any chest pain or pain radiating to her upper extremities. Patient denies any shortness of breath, difficulty breathing at night, cough or hemoptysis. Patient denies any swelling in the lower legs. Patient denies any nausea vomiting, vomiting of blood, or coffee ground material in the vomitus. Patient denies any stomach pain. Patient states has had normal bowel movements no significant constipation or diarrhea. Patient denies any dysuria, hematuria or significant nocturia. Patient denies any problems walking, swelling in the joints or loss of balance. Patient denies any skin changes, loss of hair or loss of weight. Patient denies any excessive worrying or anxiety or significant depression. Patient denies any problems with insomnia. Patient denies excessive thirst, polyuria, polydipsia. Patient denies any swollen glands, patient denies easy bruising or easy bleeding. Patient denies any recent infections, allergies or URI. Patient "s visual fields have not changed significantly in recent time.    PHYSICAL EXAM: BP (!) 142/81   Pulse (!) 58   Temp 98.2 F (36.8 C)   Resp 18   Wt 221 lb 12.5 oz (100.6 kg)   BMI 31.82 kg/m  On rectal exam rectal sphincter tone is good there is a slight firm prosthetic nodule in the right lateral lobe although the sulcus is preserved bilaterally. No other rectal abnormality is identified. Well-developed well-nourished patient in NAD. HEENT reveals PERLA, EOMI, discs not visualized.  Oral cavity is clear. No oral mucosal lesions are identified. Neck is clear without evidence of cervical or supraclavicular adenopathy. Lungs are clear to A&P. Cardiac examination is essentially unremarkable with regular rate and rhythm without murmur rub or thrill. Abdomen is benign with no organomegaly or masses noted. Motor sensory and DTR levels are equal and symmetric in the upper and lower extremities. Cranial nerves II through XII are grossly intact.  Proprioception is intact. No peripheral adenopathy or edema is identified. No motor or sensory levels are noted. Crude visual fields are within normal range.     LABORATORY DATA: pathology reports reviewed    RADIOLOGY RESULTS:bone scan and CT scan of abdomen and pelvis reviewed   IMPRESSION: stage IIB Gleason 8 adenocarcinoma the prostate in 72 year old male  PLAN: at this time like to go ahead with I MRT radiation therapy to both his prostate and pelvic nodes. [facility] nomogram demonstrates only a 23% chance of organ confined disease in a 25% chance of pelvic lymph node involvement. I would plan on delivering 8000 cGy to his prostate and proximal seminal vesicle and his pelvic knows to 5400 cGy using I MRT radiation therapy dose painting technique. I've also will start him on Lupron therapy and that will be taken over by Dr. Cherrie Gauze office for his next series of injections. Risks and benefits of treatment including increased lower urinary tract symptoms diarrhea fatigue alteration of blood counts possible skin reaction all were discussed in detail with the patient and his wife. Patient seems to comprehend our treatment plan well. I personally set up and ordered CT simulation for later this week.  I would like to take this opportunity to thank you for allowing me to participate  in the care of your patient.[name], MD

## 2017-06-05 ENCOUNTER — Ambulatory Visit
Admission: RE | Admit: 2017-06-05 | Discharge: 2017-06-05 | Disposition: A | Discharge status: Home / Self Care | Payer: Medicare Other | Source: Ambulatory Visit | Attending: Radiation Oncology | Admitting: Radiation Oncology

## 2017-06-05 ENCOUNTER — Ambulatory Visit: Payer: Medicare Other | Admitting: Urology

## 2017-06-05 DIAGNOSIS — C61 Malignant neoplasm of prostate: Secondary | ICD-10-CM | POA: Insufficient documentation

## 2017-06-05 DIAGNOSIS — Z510 Encounter for antineoplastic radiation therapy: Secondary | ICD-10-CM | POA: Diagnosis not present

## 2017-06-11 DIAGNOSIS — C61 Malignant neoplasm of prostate: Secondary | ICD-10-CM | POA: Diagnosis not present

## 2017-06-11 DIAGNOSIS — Z510 Encounter for antineoplastic radiation therapy: Secondary | ICD-10-CM | POA: Diagnosis not present

## 2017-06-16 ENCOUNTER — Ambulatory Visit
Admission: RE | Admit: 2017-06-16 | Discharge: 2017-06-16 | Disposition: A | Discharge status: Home / Self Care | Payer: Medicare Other | Source: Ambulatory Visit | Attending: Radiation Oncology | Admitting: Radiation Oncology

## 2017-06-16 ENCOUNTER — Other Ambulatory Visit: Payer: Self-pay | Admitting: *Deleted

## 2017-06-16 DIAGNOSIS — C61 Malignant neoplasm of prostate: Secondary | ICD-10-CM

## 2017-06-17 ENCOUNTER — Ambulatory Visit
Admission: RE | Admit: 2017-06-17 | Discharge: 2017-06-17 | Disposition: A | Discharge status: Home / Self Care | Payer: Medicare Other | Source: Ambulatory Visit | Attending: Radiation Oncology | Admitting: Radiation Oncology

## 2017-06-17 DIAGNOSIS — Z510 Encounter for antineoplastic radiation therapy: Secondary | ICD-10-CM | POA: Diagnosis not present

## 2017-06-17 DIAGNOSIS — C61 Malignant neoplasm of prostate: Secondary | ICD-10-CM | POA: Diagnosis not present

## 2017-06-18 ENCOUNTER — Ambulatory Visit
Admission: RE | Admit: 2017-06-18 | Discharge: 2017-06-18 | Disposition: A | Discharge status: Home / Self Care | Payer: Medicare Other | Source: Ambulatory Visit | Attending: Radiation Oncology | Admitting: Radiation Oncology

## 2017-06-18 DIAGNOSIS — Z510 Encounter for antineoplastic radiation therapy: Secondary | ICD-10-CM | POA: Diagnosis not present

## 2017-06-18 DIAGNOSIS — C61 Malignant neoplasm of prostate: Secondary | ICD-10-CM | POA: Diagnosis not present

## 2017-06-19 ENCOUNTER — Ambulatory Visit
Admission: RE | Admit: 2017-06-19 | Discharge: 2017-06-19 | Disposition: A | Discharge status: Home / Self Care | Payer: Medicare Other | Source: Ambulatory Visit | Attending: Radiation Oncology | Admitting: Radiation Oncology

## 2017-06-19 DIAGNOSIS — C61 Malignant neoplasm of prostate: Secondary | ICD-10-CM | POA: Diagnosis not present

## 2017-06-19 DIAGNOSIS — Z510 Encounter for antineoplastic radiation therapy: Secondary | ICD-10-CM | POA: Diagnosis not present

## 2017-06-20 ENCOUNTER — Ambulatory Visit
Admission: RE | Admit: 2017-06-20 | Discharge: 2017-06-20 | Disposition: A | Discharge status: Home / Self Care | Payer: Medicare Other | Source: Ambulatory Visit | Attending: Radiation Oncology | Admitting: Radiation Oncology

## 2017-06-20 DIAGNOSIS — Z510 Encounter for antineoplastic radiation therapy: Secondary | ICD-10-CM | POA: Diagnosis not present

## 2017-06-20 DIAGNOSIS — C61 Malignant neoplasm of prostate: Secondary | ICD-10-CM | POA: Diagnosis not present

## 2017-06-23 ENCOUNTER — Ambulatory Visit
Admission: RE | Admit: 2017-06-23 | Discharge: 2017-06-23 | Disposition: A | Discharge status: Home / Self Care | Payer: Medicare Other | Source: Ambulatory Visit | Attending: Radiation Oncology | Admitting: Radiation Oncology

## 2017-06-23 DIAGNOSIS — C61 Malignant neoplasm of prostate: Secondary | ICD-10-CM | POA: Diagnosis not present

## 2017-06-23 DIAGNOSIS — Z510 Encounter for antineoplastic radiation therapy: Secondary | ICD-10-CM | POA: Diagnosis not present

## 2017-06-24 ENCOUNTER — Ambulatory Visit
Admission: RE | Admit: 2017-06-24 | Discharge: 2017-06-24 | Disposition: A | Discharge status: Home / Self Care | Payer: Medicare Other | Source: Ambulatory Visit | Attending: Radiation Oncology | Admitting: Radiation Oncology

## 2017-06-24 ENCOUNTER — Other Ambulatory Visit: Payer: Self-pay | Admitting: *Deleted

## 2017-06-24 DIAGNOSIS — C61 Malignant neoplasm of prostate: Secondary | ICD-10-CM | POA: Diagnosis not present

## 2017-06-24 DIAGNOSIS — Z510 Encounter for antineoplastic radiation therapy: Secondary | ICD-10-CM | POA: Diagnosis not present

## 2017-06-25 ENCOUNTER — Ambulatory Visit
Admission: RE | Admit: 2017-06-25 | Discharge: 2017-06-25 | Disposition: A | Discharge status: Home / Self Care | Payer: Medicare Other | Source: Ambulatory Visit | Attending: Radiation Oncology | Admitting: Radiation Oncology

## 2017-06-25 DIAGNOSIS — C61 Malignant neoplasm of prostate: Secondary | ICD-10-CM | POA: Diagnosis not present

## 2017-06-25 DIAGNOSIS — Z510 Encounter for antineoplastic radiation therapy: Secondary | ICD-10-CM | POA: Diagnosis not present

## 2017-06-26 ENCOUNTER — Ambulatory Visit
Admission: RE | Admit: 2017-06-26 | Discharge: 2017-06-26 | Disposition: A | Discharge status: Home / Self Care | Payer: Medicare Other | Source: Ambulatory Visit | Attending: Radiation Oncology | Admitting: Radiation Oncology

## 2017-06-26 DIAGNOSIS — C61 Malignant neoplasm of prostate: Secondary | ICD-10-CM | POA: Diagnosis not present

## 2017-06-26 DIAGNOSIS — Z510 Encounter for antineoplastic radiation therapy: Secondary | ICD-10-CM | POA: Diagnosis not present

## 2017-06-27 ENCOUNTER — Ambulatory Visit
Admission: RE | Admit: 2017-06-27 | Discharge: 2017-06-27 | Disposition: A | Discharge status: Home / Self Care | Payer: Medicare Other | Source: Ambulatory Visit | Attending: Radiation Oncology | Admitting: Radiation Oncology

## 2017-06-27 DIAGNOSIS — Z510 Encounter for antineoplastic radiation therapy: Secondary | ICD-10-CM | POA: Diagnosis not present

## 2017-06-27 DIAGNOSIS — C61 Malignant neoplasm of prostate: Secondary | ICD-10-CM | POA: Diagnosis not present

## 2017-06-30 ENCOUNTER — Ambulatory Visit
Admission: RE | Admit: 2017-06-30 | Discharge: 2017-06-30 | Disposition: A | Discharge status: Home / Self Care | Payer: Medicare Other | Source: Ambulatory Visit | Attending: Radiation Oncology | Admitting: Radiation Oncology

## 2017-06-30 DIAGNOSIS — C61 Malignant neoplasm of prostate: Secondary | ICD-10-CM | POA: Diagnosis not present

## 2017-06-30 DIAGNOSIS — Z510 Encounter for antineoplastic radiation therapy: Secondary | ICD-10-CM | POA: Diagnosis not present

## 2017-06-30 DIAGNOSIS — G4733 Obstructive sleep apnea (adult) (pediatric): Secondary | ICD-10-CM | POA: Diagnosis not present

## 2017-06-30 DIAGNOSIS — I10 Essential (primary) hypertension: Secondary | ICD-10-CM | POA: Diagnosis not present

## 2017-06-30 DIAGNOSIS — E669 Obesity, unspecified: Secondary | ICD-10-CM | POA: Diagnosis not present

## 2017-06-30 DIAGNOSIS — K219 Gastro-esophageal reflux disease without esophagitis: Secondary | ICD-10-CM | POA: Diagnosis not present

## 2017-07-01 ENCOUNTER — Ambulatory Visit
Admission: RE | Admit: 2017-07-01 | Discharge: 2017-07-01 | Disposition: A | Discharge status: Home / Self Care | Payer: Medicare Other | Source: Ambulatory Visit | Attending: Radiation Oncology | Admitting: Radiation Oncology

## 2017-07-01 DIAGNOSIS — I10 Essential (primary) hypertension: Secondary | ICD-10-CM | POA: Diagnosis not present

## 2017-07-01 DIAGNOSIS — H353131 Nonexudative age-related macular degeneration, bilateral, early dry stage: Secondary | ICD-10-CM | POA: Diagnosis not present

## 2017-07-01 DIAGNOSIS — H52223 Regular astigmatism, bilateral: Secondary | ICD-10-CM | POA: Diagnosis not present

## 2017-07-01 DIAGNOSIS — H338 Other retinal detachments: Secondary | ICD-10-CM | POA: Diagnosis not present

## 2017-07-01 DIAGNOSIS — H5213 Myopia, bilateral: Secondary | ICD-10-CM | POA: Diagnosis not present

## 2017-07-01 DIAGNOSIS — H524 Presbyopia: Secondary | ICD-10-CM | POA: Diagnosis not present

## 2017-07-01 DIAGNOSIS — B0052 Herpesviral keratitis: Secondary | ICD-10-CM | POA: Diagnosis not present

## 2017-07-01 DIAGNOSIS — C61 Malignant neoplasm of prostate: Secondary | ICD-10-CM | POA: Diagnosis not present

## 2017-07-01 DIAGNOSIS — Z510 Encounter for antineoplastic radiation therapy: Secondary | ICD-10-CM | POA: Diagnosis not present

## 2017-07-01 DIAGNOSIS — H43813 Vitreous degeneration, bilateral: Secondary | ICD-10-CM | POA: Diagnosis not present

## 2017-07-02 ENCOUNTER — Ambulatory Visit
Admission: RE | Admit: 2017-07-02 | Discharge: 2017-07-02 | Disposition: A | Discharge status: Home / Self Care | Payer: Medicare Other | Source: Ambulatory Visit | Attending: Radiation Oncology | Admitting: Radiation Oncology

## 2017-07-02 ENCOUNTER — Inpatient Hospital Stay: Payer: Medicare Other | Attending: Radiation Oncology

## 2017-07-02 ENCOUNTER — Inpatient Hospital Stay: Payer: Medicare Other

## 2017-07-02 DIAGNOSIS — C61 Malignant neoplasm of prostate: Secondary | ICD-10-CM | POA: Diagnosis not present

## 2017-07-02 DIAGNOSIS — Z5111 Encounter for antineoplastic chemotherapy: Secondary | ICD-10-CM | POA: Diagnosis not present

## 2017-07-02 DIAGNOSIS — Z510 Encounter for antineoplastic radiation therapy: Secondary | ICD-10-CM | POA: Diagnosis not present

## 2017-07-02 LAB — CBC
HCT: 45.3 % (ref 40.0–52.0)
Hemoglobin: 15.6 g/dL (ref 13.0–18.0)
MCH: 32.5 pg (ref 26.0–34.0)
MCHC: 34.5 g/dL (ref 32.0–36.0)
MCV: 94.3 fL (ref 80.0–100.0)
PLATELETS: 157 10*3/uL (ref 150–440)
RBC: 4.8 MIL/uL (ref 4.40–5.90)
RDW: 13.7 % (ref 11.5–14.5)
WBC: 4.5 10*3/uL (ref 3.8–10.6)

## 2017-07-02 MED ORDER — LEUPROLIDE ACETATE (4 MONTH) 30 MG IM KIT
30.0000 mg | PACK | Freq: Once | INTRAMUSCULAR | Status: AC
  Administered 2017-07-02: 30 mg via INTRAMUSCULAR

## 2017-07-03 ENCOUNTER — Ambulatory Visit
Admission: RE | Admit: 2017-07-03 | Discharge: 2017-07-03 | Disposition: A | Discharge status: Home / Self Care | Payer: Medicare Other | Source: Ambulatory Visit | Attending: Radiation Oncology | Admitting: Radiation Oncology

## 2017-07-03 DIAGNOSIS — Z510 Encounter for antineoplastic radiation therapy: Secondary | ICD-10-CM | POA: Diagnosis not present

## 2017-07-03 DIAGNOSIS — C61 Malignant neoplasm of prostate: Secondary | ICD-10-CM | POA: Diagnosis not present

## 2017-07-04 ENCOUNTER — Ambulatory Visit: Payer: Medicare Other

## 2017-07-07 ENCOUNTER — Ambulatory Visit
Admission: RE | Admit: 2017-07-07 | Discharge: 2017-07-07 | Disposition: A | Discharge status: Home / Self Care | Payer: Medicare Other | Source: Ambulatory Visit | Attending: Radiation Oncology | Admitting: Radiation Oncology

## 2017-07-07 DIAGNOSIS — Z510 Encounter for antineoplastic radiation therapy: Secondary | ICD-10-CM | POA: Diagnosis not present

## 2017-07-07 DIAGNOSIS — C61 Malignant neoplasm of prostate: Secondary | ICD-10-CM | POA: Diagnosis not present

## 2017-07-08 ENCOUNTER — Ambulatory Visit
Admission: RE | Admit: 2017-07-08 | Discharge: 2017-07-08 | Disposition: A | Discharge status: Home / Self Care | Payer: Medicare Other | Source: Ambulatory Visit | Attending: Radiation Oncology | Admitting: Radiation Oncology

## 2017-07-08 DIAGNOSIS — C61 Malignant neoplasm of prostate: Secondary | ICD-10-CM | POA: Diagnosis not present

## 2017-07-08 DIAGNOSIS — Z510 Encounter for antineoplastic radiation therapy: Secondary | ICD-10-CM | POA: Diagnosis not present

## 2017-07-09 ENCOUNTER — Ambulatory Visit
Admission: RE | Admit: 2017-07-09 | Discharge: 2017-07-09 | Disposition: A | Discharge status: Home / Self Care | Payer: Medicare Other | Source: Ambulatory Visit | Attending: Radiation Oncology | Admitting: Radiation Oncology

## 2017-07-09 DIAGNOSIS — C61 Malignant neoplasm of prostate: Secondary | ICD-10-CM | POA: Diagnosis not present

## 2017-07-09 DIAGNOSIS — Z510 Encounter for antineoplastic radiation therapy: Secondary | ICD-10-CM | POA: Diagnosis not present

## 2017-07-10 ENCOUNTER — Ambulatory Visit
Admission: RE | Admit: 2017-07-10 | Discharge: 2017-07-10 | Disposition: A | Discharge status: Home / Self Care | Payer: Medicare Other | Source: Ambulatory Visit | Attending: Radiation Oncology | Admitting: Radiation Oncology

## 2017-07-10 DIAGNOSIS — C61 Malignant neoplasm of prostate: Secondary | ICD-10-CM | POA: Diagnosis not present

## 2017-07-10 DIAGNOSIS — Z510 Encounter for antineoplastic radiation therapy: Secondary | ICD-10-CM | POA: Diagnosis not present

## 2017-07-11 ENCOUNTER — Ambulatory Visit
Admission: RE | Admit: 2017-07-11 | Discharge: 2017-07-11 | Disposition: A | Discharge status: Home / Self Care | Payer: Medicare Other | Source: Ambulatory Visit | Attending: Radiation Oncology | Admitting: Radiation Oncology

## 2017-07-11 DIAGNOSIS — C61 Malignant neoplasm of prostate: Secondary | ICD-10-CM | POA: Diagnosis not present

## 2017-07-11 DIAGNOSIS — Z510 Encounter for antineoplastic radiation therapy: Secondary | ICD-10-CM | POA: Diagnosis not present

## 2017-07-15 ENCOUNTER — Ambulatory Visit
Admission: RE | Admit: 2017-07-15 | Discharge: 2017-07-15 | Disposition: A | Discharge status: Home / Self Care | Payer: Medicare Other | Source: Ambulatory Visit | Attending: Radiation Oncology | Admitting: Radiation Oncology

## 2017-07-15 DIAGNOSIS — C61 Malignant neoplasm of prostate: Secondary | ICD-10-CM | POA: Diagnosis not present

## 2017-07-15 DIAGNOSIS — Z510 Encounter for antineoplastic radiation therapy: Secondary | ICD-10-CM | POA: Diagnosis not present

## 2017-07-16 ENCOUNTER — Ambulatory Visit
Admission: RE | Admit: 2017-07-16 | Discharge: 2017-07-16 | Disposition: A | Discharge status: Home / Self Care | Payer: Medicare Other | Source: Ambulatory Visit | Attending: Radiation Oncology | Admitting: Radiation Oncology

## 2017-07-16 ENCOUNTER — Inpatient Hospital Stay: Payer: Medicare Other

## 2017-07-16 ENCOUNTER — Other Ambulatory Visit: Payer: Self-pay

## 2017-07-16 DIAGNOSIS — Z510 Encounter for antineoplastic radiation therapy: Secondary | ICD-10-CM | POA: Diagnosis not present

## 2017-07-16 DIAGNOSIS — C61 Malignant neoplasm of prostate: Secondary | ICD-10-CM

## 2017-07-16 DIAGNOSIS — Z5111 Encounter for antineoplastic chemotherapy: Secondary | ICD-10-CM | POA: Diagnosis not present

## 2017-07-16 LAB — CBC
HEMATOCRIT: 45.8 % (ref 40.0–52.0)
HEMOGLOBIN: 15.9 g/dL (ref 13.0–18.0)
MCH: 32.9 pg (ref 26.0–34.0)
MCHC: 34.8 g/dL (ref 32.0–36.0)
MCV: 94.5 fL (ref 80.0–100.0)
PLATELETS: 165 10*3/uL (ref 150–440)
RBC: 4.84 MIL/uL (ref 4.40–5.90)
RDW: 13.9 % (ref 11.5–14.5)
WBC: 4.7 10*3/uL (ref 3.8–10.6)

## 2017-07-17 ENCOUNTER — Ambulatory Visit
Admission: RE | Admit: 2017-07-17 | Discharge: 2017-07-17 | Disposition: A | Discharge status: Home / Self Care | Payer: Medicare Other | Source: Ambulatory Visit | Attending: Radiation Oncology | Admitting: Radiation Oncology

## 2017-07-17 DIAGNOSIS — C61 Malignant neoplasm of prostate: Secondary | ICD-10-CM | POA: Diagnosis not present

## 2017-07-17 DIAGNOSIS — Z510 Encounter for antineoplastic radiation therapy: Secondary | ICD-10-CM | POA: Diagnosis not present

## 2017-07-18 ENCOUNTER — Ambulatory Visit
Admission: RE | Admit: 2017-07-18 | Discharge: 2017-07-18 | Disposition: A | Discharge status: Home / Self Care | Payer: Medicare Other | Source: Ambulatory Visit | Attending: Radiation Oncology | Admitting: Radiation Oncology

## 2017-07-18 DIAGNOSIS — Z510 Encounter for antineoplastic radiation therapy: Secondary | ICD-10-CM | POA: Diagnosis not present

## 2017-07-18 DIAGNOSIS — C61 Malignant neoplasm of prostate: Secondary | ICD-10-CM | POA: Diagnosis not present

## 2017-07-21 ENCOUNTER — Ambulatory Visit
Admission: RE | Admit: 2017-07-21 | Discharge: 2017-07-21 | Disposition: A | Discharge status: Home / Self Care | Payer: Medicare Other | Source: Ambulatory Visit | Attending: Radiation Oncology | Admitting: Radiation Oncology

## 2017-07-21 DIAGNOSIS — Z510 Encounter for antineoplastic radiation therapy: Secondary | ICD-10-CM | POA: Diagnosis not present

## 2017-07-21 DIAGNOSIS — C61 Malignant neoplasm of prostate: Secondary | ICD-10-CM | POA: Diagnosis not present

## 2017-07-22 ENCOUNTER — Ambulatory Visit
Admission: RE | Admit: 2017-07-22 | Discharge: 2017-07-22 | Disposition: A | Discharge status: Home / Self Care | Payer: Medicare Other | Source: Ambulatory Visit | Attending: Radiation Oncology | Admitting: Radiation Oncology

## 2017-07-22 DIAGNOSIS — Z510 Encounter for antineoplastic radiation therapy: Secondary | ICD-10-CM | POA: Diagnosis not present

## 2017-07-22 DIAGNOSIS — C61 Malignant neoplasm of prostate: Secondary | ICD-10-CM | POA: Diagnosis not present

## 2017-07-23 ENCOUNTER — Other Ambulatory Visit: Payer: Self-pay | Admitting: Family Medicine

## 2017-07-23 ENCOUNTER — Ambulatory Visit
Admission: RE | Admit: 2017-07-23 | Discharge: 2017-07-23 | Disposition: A | Discharge status: Home / Self Care | Payer: Medicare Other | Source: Ambulatory Visit | Attending: Radiation Oncology | Admitting: Radiation Oncology

## 2017-07-23 DIAGNOSIS — Z510 Encounter for antineoplastic radiation therapy: Secondary | ICD-10-CM | POA: Diagnosis not present

## 2017-07-23 DIAGNOSIS — C61 Malignant neoplasm of prostate: Secondary | ICD-10-CM | POA: Diagnosis not present

## 2017-07-23 DIAGNOSIS — I10 Essential (primary) hypertension: Secondary | ICD-10-CM

## 2017-07-24 ENCOUNTER — Ambulatory Visit
Admission: RE | Admit: 2017-07-24 | Discharge: 2017-07-24 | Disposition: A | Discharge status: Home / Self Care | Payer: Medicare Other | Source: Ambulatory Visit | Attending: Radiation Oncology | Admitting: Radiation Oncology

## 2017-07-24 DIAGNOSIS — C61 Malignant neoplasm of prostate: Secondary | ICD-10-CM | POA: Diagnosis not present

## 2017-07-24 DIAGNOSIS — Z510 Encounter for antineoplastic radiation therapy: Secondary | ICD-10-CM | POA: Diagnosis not present

## 2017-07-24 NOTE — Telephone Encounter (Signed)
amlodipine refill Last Refill:03/25/16 # 90 4 RF Last OV: 05/27/17 PCP: Dr. Jeananne Rama Pharmacy:[facility]  Lorazepam refill Last Refill:10/07/16 # 30 1 RF Last OV: 05/27/17 PCP: Dr Jeananne Rama Pharmacy:[facility]

## 2017-07-25 ENCOUNTER — Ambulatory Visit
Admission: RE | Admit: 2017-07-25 | Discharge: 2017-07-25 | Disposition: A | Discharge status: Home / Self Care | Payer: Medicare Other | Source: Ambulatory Visit | Attending: Radiation Oncology | Admitting: Radiation Oncology

## 2017-07-25 DIAGNOSIS — Z510 Encounter for antineoplastic radiation therapy: Secondary | ICD-10-CM | POA: Diagnosis not present

## 2017-07-25 DIAGNOSIS — C61 Malignant neoplasm of prostate: Secondary | ICD-10-CM | POA: Diagnosis not present

## 2017-07-26 ENCOUNTER — Ambulatory Visit: Payer: Medicare Other

## 2017-07-27 ENCOUNTER — Ambulatory Visit: Payer: Medicare Other

## 2017-07-28 ENCOUNTER — Ambulatory Visit
Admission: RE | Admit: 2017-07-28 | Discharge: 2017-07-28 | Disposition: A | Discharge status: Home / Self Care | Payer: Medicare Other | Source: Ambulatory Visit | Attending: Radiation Oncology | Admitting: Radiation Oncology

## 2017-07-28 DIAGNOSIS — Z510 Encounter for antineoplastic radiation therapy: Secondary | ICD-10-CM | POA: Diagnosis not present

## 2017-07-28 DIAGNOSIS — C61 Malignant neoplasm of prostate: Secondary | ICD-10-CM | POA: Diagnosis not present

## 2017-07-29 ENCOUNTER — Ambulatory Visit
Admission: RE | Admit: 2017-07-29 | Discharge: 2017-07-29 | Disposition: A | Discharge status: Home / Self Care | Payer: Medicare Other | Source: Ambulatory Visit | Attending: Radiation Oncology | Admitting: Radiation Oncology

## 2017-07-29 DIAGNOSIS — Z510 Encounter for antineoplastic radiation therapy: Secondary | ICD-10-CM | POA: Diagnosis not present

## 2017-07-29 DIAGNOSIS — C61 Malignant neoplasm of prostate: Secondary | ICD-10-CM | POA: Diagnosis not present

## 2017-07-30 ENCOUNTER — Inpatient Hospital Stay: Payer: Medicare Other | Attending: Radiation Oncology

## 2017-07-30 ENCOUNTER — Ambulatory Visit
Admission: RE | Admit: 2017-07-30 | Discharge: 2017-07-30 | Disposition: A | Discharge status: Home / Self Care | Payer: Medicare Other | Source: Ambulatory Visit | Attending: Radiation Oncology | Admitting: Radiation Oncology

## 2017-07-30 DIAGNOSIS — Z510 Encounter for antineoplastic radiation therapy: Secondary | ICD-10-CM | POA: Diagnosis not present

## 2017-07-30 DIAGNOSIS — C61 Malignant neoplasm of prostate: Secondary | ICD-10-CM

## 2017-07-30 DIAGNOSIS — D509 Iron deficiency anemia, unspecified: Secondary | ICD-10-CM | POA: Insufficient documentation

## 2017-07-30 LAB — CBC
HCT: 44.5 % (ref 40.0–52.0)
Hemoglobin: 15.7 g/dL (ref 13.0–18.0)
MCH: 32.9 pg (ref 26.0–34.0)
MCHC: 35.2 g/dL (ref 32.0–36.0)
MCV: 93.3 fL (ref 80.0–100.0)
PLATELETS: 162 10*3/uL (ref 150–440)
RBC: 4.76 MIL/uL (ref 4.40–5.90)
RDW: 14.2 % (ref 11.5–14.5)
WBC: 4.4 10*3/uL (ref 3.8–10.6)

## 2017-07-31 ENCOUNTER — Ambulatory Visit
Admission: RE | Admit: 2017-07-31 | Discharge: 2017-07-31 | Disposition: A | Discharge status: Home / Self Care | Payer: Medicare Other | Source: Ambulatory Visit | Attending: Radiation Oncology | Admitting: Radiation Oncology

## 2017-07-31 DIAGNOSIS — C61 Malignant neoplasm of prostate: Secondary | ICD-10-CM | POA: Diagnosis not present

## 2017-07-31 DIAGNOSIS — Z510 Encounter for antineoplastic radiation therapy: Secondary | ICD-10-CM | POA: Diagnosis not present

## 2017-08-01 ENCOUNTER — Ambulatory Visit
Admission: RE | Admit: 2017-08-01 | Discharge: 2017-08-01 | Disposition: A | Discharge status: Home / Self Care | Payer: Medicare Other | Source: Ambulatory Visit | Attending: Radiation Oncology | Admitting: Radiation Oncology

## 2017-08-01 DIAGNOSIS — C61 Malignant neoplasm of prostate: Secondary | ICD-10-CM | POA: Diagnosis not present

## 2017-08-01 DIAGNOSIS — Z510 Encounter for antineoplastic radiation therapy: Secondary | ICD-10-CM | POA: Diagnosis not present

## 2017-08-04 ENCOUNTER — Ambulatory Visit
Admission: RE | Admit: 2017-08-04 | Discharge: 2017-08-04 | Disposition: A | Discharge status: Home / Self Care | Payer: Medicare Other | Source: Ambulatory Visit | Attending: Radiation Oncology | Admitting: Radiation Oncology

## 2017-08-04 DIAGNOSIS — Z510 Encounter for antineoplastic radiation therapy: Secondary | ICD-10-CM | POA: Diagnosis not present

## 2017-08-04 DIAGNOSIS — C61 Malignant neoplasm of prostate: Secondary | ICD-10-CM | POA: Diagnosis not present

## 2017-08-05 ENCOUNTER — Ambulatory Visit
Admission: RE | Admit: 2017-08-05 | Discharge: 2017-08-05 | Disposition: A | Discharge status: Home / Self Care | Payer: Medicare Other | Source: Ambulatory Visit | Attending: Radiation Oncology | Admitting: Radiation Oncology

## 2017-08-05 DIAGNOSIS — C61 Malignant neoplasm of prostate: Secondary | ICD-10-CM | POA: Diagnosis not present

## 2017-08-05 DIAGNOSIS — Z510 Encounter for antineoplastic radiation therapy: Secondary | ICD-10-CM | POA: Diagnosis not present

## 2017-08-06 ENCOUNTER — Ambulatory Visit
Admission: RE | Admit: 2017-08-06 | Discharge: 2017-08-06 | Disposition: A | Discharge status: Home / Self Care | Payer: Medicare Other | Source: Ambulatory Visit | Attending: Radiation Oncology | Admitting: Radiation Oncology

## 2017-08-06 DIAGNOSIS — Z510 Encounter for antineoplastic radiation therapy: Secondary | ICD-10-CM | POA: Diagnosis not present

## 2017-08-06 DIAGNOSIS — C61 Malignant neoplasm of prostate: Secondary | ICD-10-CM | POA: Diagnosis not present

## 2017-08-07 ENCOUNTER — Ambulatory Visit
Admission: RE | Admit: 2017-08-07 | Discharge: 2017-08-07 | Disposition: A | Discharge status: Home / Self Care | Payer: Medicare Other | Source: Ambulatory Visit | Attending: Radiation Oncology | Admitting: Radiation Oncology

## 2017-08-07 DIAGNOSIS — Z510 Encounter for antineoplastic radiation therapy: Secondary | ICD-10-CM | POA: Diagnosis not present

## 2017-08-07 DIAGNOSIS — C61 Malignant neoplasm of prostate: Secondary | ICD-10-CM | POA: Diagnosis not present

## 2017-08-08 ENCOUNTER — Ambulatory Visit
Admission: RE | Admit: 2017-08-08 | Discharge: 2017-08-08 | Disposition: A | Discharge status: Home / Self Care | Payer: Medicare Other | Source: Ambulatory Visit | Attending: Radiation Oncology | Admitting: Radiation Oncology

## 2017-08-08 DIAGNOSIS — Z510 Encounter for antineoplastic radiation therapy: Secondary | ICD-10-CM | POA: Diagnosis not present

## 2017-08-08 DIAGNOSIS — C61 Malignant neoplasm of prostate: Secondary | ICD-10-CM | POA: Diagnosis not present

## 2017-08-11 ENCOUNTER — Ambulatory Visit: Payer: Medicare Other

## 2017-08-11 ENCOUNTER — Ambulatory Visit: Admission: RE | Admit: 2017-08-11 | Payer: Medicare Other | Source: Ambulatory Visit

## 2017-08-12 ENCOUNTER — Ambulatory Visit
Admission: RE | Admit: 2017-08-12 | Discharge: 2017-08-12 | Disposition: A | Discharge status: Home / Self Care | Payer: Medicare Other | Source: Ambulatory Visit | Attending: Radiation Oncology | Admitting: Radiation Oncology

## 2017-08-12 ENCOUNTER — Ambulatory Visit: Payer: Medicare Other

## 2017-08-12 DIAGNOSIS — C61 Malignant neoplasm of prostate: Secondary | ICD-10-CM | POA: Diagnosis not present

## 2017-08-12 DIAGNOSIS — Z510 Encounter for antineoplastic radiation therapy: Secondary | ICD-10-CM | POA: Diagnosis not present

## 2017-08-13 ENCOUNTER — Ambulatory Visit
Admission: RE | Admit: 2017-08-13 | Discharge: 2017-08-13 | Disposition: A | Discharge status: Home / Self Care | Payer: Medicare Other | Source: Ambulatory Visit | Attending: Radiation Oncology | Admitting: Radiation Oncology

## 2017-08-13 ENCOUNTER — Ambulatory Visit: Payer: Medicare Other

## 2017-08-13 DIAGNOSIS — Z510 Encounter for antineoplastic radiation therapy: Secondary | ICD-10-CM | POA: Diagnosis not present

## 2017-08-13 DIAGNOSIS — C61 Malignant neoplasm of prostate: Secondary | ICD-10-CM | POA: Diagnosis not present

## 2017-08-14 ENCOUNTER — Ambulatory Visit
Admission: RE | Admit: 2017-08-14 | Discharge: 2017-08-14 | Disposition: A | Discharge status: Home / Self Care | Payer: Medicare Other | Source: Ambulatory Visit | Attending: Radiation Oncology | Admitting: Radiation Oncology

## 2017-08-14 DIAGNOSIS — C61 Malignant neoplasm of prostate: Secondary | ICD-10-CM | POA: Diagnosis not present

## 2017-08-14 DIAGNOSIS — Z510 Encounter for antineoplastic radiation therapy: Secondary | ICD-10-CM | POA: Diagnosis not present

## 2017-09-17 ENCOUNTER — Ambulatory Visit
Admission: RE | Admit: 2017-09-17 | Discharge: 2017-09-17 | Disposition: A | Discharge status: Home / Self Care | Payer: Medicare Other | Source: Ambulatory Visit | Attending: Radiation Oncology | Admitting: Radiation Oncology

## 2017-09-17 ENCOUNTER — Other Ambulatory Visit: Payer: Self-pay

## 2017-09-17 ENCOUNTER — Encounter: Payer: Self-pay | Admitting: Radiation Oncology

## 2017-09-17 ENCOUNTER — Other Ambulatory Visit: Payer: Self-pay | Admitting: *Deleted

## 2017-09-17 VITALS — BP 141/82 | HR 58 | Temp 97.2°F | Resp 20 | Wt 224.2 lb

## 2017-09-17 DIAGNOSIS — Z08 Encounter for follow-up examination after completed treatment for malignant neoplasm: Secondary | ICD-10-CM | POA: Diagnosis not present

## 2017-09-17 DIAGNOSIS — C61 Malignant neoplasm of prostate: Secondary | ICD-10-CM | POA: Diagnosis not present

## 2017-09-17 NOTE — Progress Notes (Signed)
Radiation Oncology Follow up Note  Name: Jonathan Burns   Date:   09/17/2017 MRN:  [id number] DOB: March 29, 1945    This 72 y.o. male presents to the clinic today for one-month follow-up status post I MRT radiation therapy to his prostate for stage IIB adenocarcinoma the prostate Gleason score of 8.  REFERRING PROVIDER: Guadalupe Maple, MD  HPI: patient is a.72 year old male now out 1 month having completed I MRT radiation therapy to both his prostate and pelvic nodes. He is currently on androgen deprivation therapy. He is seen today in routine follow-up is doing well. He specifically denies diarrhea dysuria or any complaints.  COMPLICATIONS OF TREATMENT: none  FOLLOW UP COMPLIANCE: keeps appointments   PHYSICAL EXAM:  BP (!) 141/82   Pulse (!) 58   Temp (!) 97.2 F (36.2 C)   Resp 20   Wt 224 lb 3.3 oz (101.7 kg)   BMI 32.17 kg/m  On rectal exam rectal sphincter tone is good. Prostate is smooth contracted without evidence of nodularity or mass. Sulcus is preserved bilaterally. No discrete nodularity is identified. No other rectal abnormalities are noted.Well-developed well-nourished patient in NAD. HEENT reveals PERLA, EOMI, discs not visualized.  Oral cavity is clear. No oral mucosal lesions are identified. Neck is clear without evidence of cervical or supraclavicular adenopathy. Lungs are clear to A&P. Cardiac examination is essentially unremarkable with regular rate and rhythm without murmur rub or thrill. Abdomen is benign with no organomegaly or masses noted. Motor sensory and DTR levels are equal and symmetric in the upper and lower extremities. Cranial nerves II through XII are grossly intact. Proprioception is intact. No peripheral adenopathy or edema is identified. No motor or sensory levels are noted. Crude visual fields are within normal range.  RADIOLOGY RESULTS: no current films for review  PLAN: present time he is recovering well from his radiation therapy treatments. I'm  please was overall progress. I'm going to turn continuation of his ADT over to Dr. Cherrie Gauze office and we will arrange that. Otherwise I've asked to see him back in 3 months with a PSA prior to that visit. Patient is to call sooner with any concerns he continues to do quite well.  I would like to take this opportunity to thank you for allowing me to participate in the care of your patient.[name], MD

## 2017-10-08 ENCOUNTER — Telehealth: Payer: Self-pay | Admitting: Urology

## 2017-10-08 DIAGNOSIS — H10232 Serous conjunctivitis, except viral, left eye: Secondary | ICD-10-CM | POA: Diagnosis not present

## 2017-10-08 DIAGNOSIS — B0050 Herpesviral ocular disease, unspecified: Secondary | ICD-10-CM | POA: Diagnosis not present

## 2017-10-08 NOTE — Telephone Encounter (Signed)
What about this guy? When is he due for lupron?  michelle

## 2017-10-08 NOTE — Telephone Encounter (Signed)
-----   Message from Daiva Huge, RN sent at 09/18/2017  8:59 AM EDT ----- Regarding: RE: Lupron 07/02/17, so he'd actually be due after 9/15.  Dr. Baruch Gouty gave a 4 month lupron (30mg ).  Sorry about putting wrong month in first message.     Ana ----- Message ----- From: Benard Halsted Sent: 09/18/2017   8:41 AM To: Daiva Huge, RN Subject: RE: Lupron                                     When did he have his last lupron shot?  Sharyn Lull ----- Message ----- From: Daiva Huge, RN Sent: 09/17/2017  10:07 AM To: Benard Halsted Subject: Lupron                                         Jonathan Burns is due for Lupron injection in August.   Dr. Baruch Gouty wants Dr. Erlene Quan to take those injections over.    Thanks,   [facility]

## 2017-10-09 NOTE — Telephone Encounter (Signed)
He can be scheduled for a 6 month [facility] nurse visit on 10-30-17 thanks

## 2017-10-10 ENCOUNTER — Telehealth: Payer: Self-pay | Admitting: Urology

## 2017-10-10 NOTE — Telephone Encounter (Signed)
Patient will be out of town on the 12th so I scheduled it on the 17th.   Sharyn Lull

## 2017-10-21 ENCOUNTER — Other Ambulatory Visit: Payer: Self-pay | Admitting: Family Medicine

## 2017-10-21 DIAGNOSIS — I10 Essential (primary) hypertension: Secondary | ICD-10-CM

## 2017-10-23 ENCOUNTER — Other Ambulatory Visit: Payer: Self-pay

## 2017-10-23 DIAGNOSIS — I10 Essential (primary) hypertension: Secondary | ICD-10-CM

## 2017-10-23 MED ORDER — LOSARTAN POTASSIUM 100 MG PO TABS: 100.0000 mg | ORAL_TABLET | Freq: Every day | ORAL | 0 refills | Status: DC

## 2017-11-04 ENCOUNTER — Ambulatory Visit (INDEPENDENT_AMBULATORY_CARE_PROVIDER_SITE_OTHER): Payer: Medicare Other

## 2017-11-04 DIAGNOSIS — C61 Malignant neoplasm of prostate: Secondary | ICD-10-CM | POA: Diagnosis not present

## 2017-11-04 MED ORDER — LEUPROLIDE ACETATE (6 MONTH) 45 MG IM KIT
45.0000 mg | PACK | Freq: Once | INTRAMUSCULAR | Status: AC
  Administered 2017-11-04: 45 mg via INTRAMUSCULAR

## 2017-11-04 NOTE — Progress Notes (Signed)
Lupron IM Injection   Due to Prostate Cancer patient is present today for a Lupron Injection.  Medication: Lupron 6 month Dose: 45 mg  Location: left upper outer buttocks Lot: [id number] Exp: 01/12/2020  Patient tolerated well, no complications were noted  Performed by: Gordy Clement, CMA ([place])  Follow up: In 6 mos as scheduled, before if needed

## 2017-11-06 DIAGNOSIS — L570 Actinic keratosis: Secondary | ICD-10-CM | POA: Diagnosis not present

## 2017-11-06 DIAGNOSIS — Z872 Personal history of diseases of the skin and subcutaneous tissue: Secondary | ICD-10-CM | POA: Diagnosis not present

## 2017-11-06 DIAGNOSIS — Z1283 Encounter for screening for malignant neoplasm of skin: Secondary | ICD-10-CM | POA: Diagnosis not present

## 2017-11-06 DIAGNOSIS — L578 Other skin changes due to chronic exposure to nonionizing radiation: Secondary | ICD-10-CM | POA: Diagnosis not present

## 2017-11-06 DIAGNOSIS — Z86018 Personal history of other benign neoplasm: Secondary | ICD-10-CM | POA: Diagnosis not present

## 2017-11-28 DIAGNOSIS — Z23 Encounter for immunization: Secondary | ICD-10-CM | POA: Diagnosis not present

## 2017-12-02 ENCOUNTER — Ambulatory Visit (INDEPENDENT_AMBULATORY_CARE_PROVIDER_SITE_OTHER): Payer: Medicare Other | Admitting: Family Medicine

## 2017-12-02 ENCOUNTER — Encounter: Payer: Self-pay | Admitting: Family Medicine

## 2017-12-02 VITALS — BP 143/83 | HR 62 | Temp 97.1°F | Ht 70.0 in | Wt 220.6 lb

## 2017-12-02 DIAGNOSIS — Z9989 Dependence on other enabling machines and devices: Secondary | ICD-10-CM

## 2017-12-02 DIAGNOSIS — K922 Gastrointestinal hemorrhage, unspecified: Secondary | ICD-10-CM

## 2017-12-02 DIAGNOSIS — G4733 Obstructive sleep apnea (adult) (pediatric): Secondary | ICD-10-CM | POA: Diagnosis not present

## 2017-12-02 DIAGNOSIS — N138 Other obstructive and reflux uropathy: Secondary | ICD-10-CM | POA: Diagnosis not present

## 2017-12-02 DIAGNOSIS — C61 Malignant neoplasm of prostate: Secondary | ICD-10-CM

## 2017-12-02 DIAGNOSIS — I10 Essential (primary) hypertension: Secondary | ICD-10-CM | POA: Diagnosis not present

## 2017-12-02 DIAGNOSIS — N401 Enlarged prostate with lower urinary tract symptoms: Secondary | ICD-10-CM

## 2017-12-02 DIAGNOSIS — E785 Hyperlipidemia, unspecified: Secondary | ICD-10-CM

## 2017-12-02 LAB — LP+ALT+AST PICCOLO, WAIVED
ALT (SGPT) Piccolo, Waived: 48 U/L — ABNORMAL HIGH (ref 10–47)
AST (SGOT) Piccolo, Waived: 36 U/L (ref 11–38)
Chol/HDL Ratio Piccolo,Waive: 3.5 mg/dL
Cholesterol Piccolo, Waived: 202 mg/dL — ABNORMAL HIGH (ref <200)
HDL CHOL PICCOLO, WAIVED: 58 mg/dL (ref >59)
LDL CHOL CALC PICCOLO WAIVED: 112 mg/dL — AB (ref <100)
Triglycerides Piccolo,Waived: 159 mg/dL — ABNORMAL HIGH (ref <150)
VLDL CHOL CALC PICCOLO,WAIVE: 32 mg/dL — AB (ref <30)

## 2017-12-02 MED ORDER — PANTOPRAZOLE SODIUM 40 MG PO TBEC: 40.0000 mg | DELAYED_RELEASE_TABLET | Freq: Every day | ORAL | 3 refills | Status: DC

## 2017-12-02 MED ORDER — LOSARTAN POTASSIUM 100 MG PO TABS: 100.0000 mg | ORAL_TABLET | Freq: Every day | ORAL | 3 refills | Status: DC

## 2017-12-02 MED ORDER — CARVEDILOL 25 MG PO TABS: 25.0000 mg | ORAL_TABLET | Freq: Two times a day (BID) | ORAL | 3 refills | Status: DC

## 2017-12-02 MED ORDER — HYDROCHLOROTHIAZIDE 25 MG PO TABS: 25.0000 mg | ORAL_TABLET | Freq: Every day | ORAL | 1 refills | Status: DC

## 2017-12-02 MED ORDER — AMLODIPINE BESYLATE 10 MG PO TABS: 10.0000 mg | ORAL_TABLET | Freq: Every day | ORAL | 3 refills | Status: DC

## 2017-12-02 MED ORDER — TAMSULOSIN HCL 0.4 MG PO CAPS: 0.4000 mg | ORAL_CAPSULE | Freq: Every day | ORAL | 3 refills | Status: DC

## 2017-12-02 NOTE — Assessment & Plan Note (Signed)
The current medical regimen is effective;  continue present plan and medications.  

## 2017-12-02 NOTE — Assessment & Plan Note (Addendum)
Discuss hypertension poor control especially after starting Lupron will add hydrochlorothiazide 25 mg to patient's regimen. Recheck in 1 to 2 months.

## 2017-12-02 NOTE — Progress Notes (Signed)
BP (!) 143/83 (BP Location: Left Arm, Cuff Size: Normal)   Pulse 62   Temp (!) 97.1 F (36.2 C) (Oral)   Ht 5\' 10"  (1.778 m)   Wt 220 lb 9.6 oz (100.1 kg)   SpO2 97%   BMI 31.65 kg/m    Subjective:    Patient ID: Jonathan Burns, male    DOB: 12-22-1945, 72 y.o.   MRN: [id number]  HPI: Jonathan Burns is a 72 y.o. male  Chief Complaint  Patient presents with  . Hyperlipidemia  . Hypertension  Patient taking blood pressure medications faithfully without problems took some this morning and blood pressure is elevated here and on chart review is been elevated elsewhere also.  Patient's now on Lupron may be having some side effects with blood pressure elevation from that. Discussed lorazepam anxiety may use 30 pills in a year and is still not track. Reflux significantly improved with patch approaches all.  Patient able to eat normally now still watches his late evening diet but otherwise does well especially since he started his CPAP which he uses faithfully.  Relevant past medical, surgical, family and social history reviewed and updated as indicated. Interim medical history since our last visit reviewed. Allergies and medications reviewed and updated.  Review of Systems  Constitutional: Negative.   Respiratory: Negative.   Cardiovascular: Negative.     Per HPI unless specifically indicated above     Objective:    BP (!) 143/83 (BP Location: Left Arm, Cuff Size: Normal)   Pulse 62   Temp (!) 97.1 F (36.2 C) (Oral)   Ht 5\' 10"  (1.778 m)   Wt 220 lb 9.6 oz (100.1 kg)   SpO2 97%   BMI 31.65 kg/m   Wt Readings from Last 3 Encounters:  12/02/17 220 lb 9.6 oz (100.1 kg)  09/17/17 224 lb 3.3 oz (101.7 kg)  06/04/17 221 lb 12.5 oz (100.6 kg)    Physical Exam  Constitutional: He is oriented to person, place, and time. He appears well-developed and well-nourished.  HENT:  Head: Normocephalic and atraumatic.  Eyes: Conjunctivae and EOM are normal.  Neck: Normal range of  motion.  Cardiovascular: Normal rate, regular rhythm and normal heart sounds.  Pulmonary/Chest: Effort normal and breath sounds normal.  Musculoskeletal: Normal range of motion.  Neurological: He is alert and oriented to person, place, and time.  Skin: No erythema.  Psychiatric: He has a normal mood and affect. His behavior is normal. Judgment and thought content normal.    Results for orders placed or performed in visit on 07/30/17  CBC  Result Value Ref Range   WBC 4.4 3.8 - 10.6 K/uL   RBC 4.76 4.40 - 5.90 MIL/uL   Hemoglobin 15.7 13.0 - 18.0 g/dL   HCT 44.5 40.0 - 52.0 %   MCV 93.3 80.0 - 100.0 fL   MCH 32.9 26.0 - 34.0 pg   MCHC 35.2 32.0 - 36.0 g/dL   RDW 14.2 11.5 - 14.5 %   Platelets 162 150 - 440 K/uL      Assessment & Plan:   Problem List Items Addressed This Visit      Cardiovascular and Mediastinum   Hypertension - Primary    Discuss hypertension poor control especially after starting Lupron will add hydrochlorothiazide 25 mg to patient's regimen. Recheck in 1 to 2 months.       Relevant Medications   hydrochlorothiazide (HYDRODIURIL) 25 MG tablet   amLODipine (NORVASC) 10 MG tablet  losartan (COZAAR) 100 MG tablet   carvedilol (COREG) 25 MG tablet   Other Relevant Orders   Basic metabolic panel     Respiratory   OSA on CPAP    Good usage with no daytime drowsiness        Digestive   Chronic GI bleeding   Relevant Medications   pantoprazole (PROTONIX) 40 MG tablet     Genitourinary   BPH with obstruction/lower urinary tract symptoms    Patient also with nocturia has to get up about every 2 hours or so to use the bathroom will try tamsulosin.      Relevant Medications   tamsulosin (FLOMAX) 0.4 MG CAPS capsule   Prostate cancer ([place])    On Lupron and stable        Other   Hyperlipidemia    The current medical regimen is effective;  continue present plan and medications.       Relevant Medications   hydrochlorothiazide (HYDRODIURIL) 25  MG tablet   amLODipine (NORVASC) 10 MG tablet   losartan (COZAAR) 100 MG tablet   carvedilol (COREG) 25 MG tablet   Other Relevant Orders   LP+ALT+AST [place], Waived       Follow up plan: Return in about 2 months (around 02/01/2018) for BMP.

## 2017-12-02 NOTE — Assessment & Plan Note (Signed)
Patient also with nocturia has to get up about every 2 hours or so to use the bathroom will try tamsulosin.

## 2017-12-02 NOTE — Assessment & Plan Note (Signed)
On Lupron and stable

## 2017-12-02 NOTE — Assessment & Plan Note (Signed)
Good usage with no daytime drowsiness

## 2017-12-03 ENCOUNTER — Encounter: Payer: Self-pay | Admitting: Family Medicine

## 2017-12-03 LAB — BASIC METABOLIC PANEL
BUN/Creatinine Ratio: 15 (ref 10–24)
BUN: 13 mg/dL (ref 8–27)
CHLORIDE: 101 mmol/L (ref 96–106)
CO2: 22 mmol/L (ref 20–29)
CREATININE: 0.89 mg/dL (ref 0.76–1.27)
Calcium: 9.6 mg/dL (ref 8.6–10.2)
GFR calc Af Amer: 98 mL/min/{1.73_m2} (ref >59)
GFR calc non Af Amer: 85 mL/min/{1.73_m2} (ref >59)
GLUCOSE: 105 mg/dL — AB (ref 65–99)
Potassium: 5.1 mmol/L (ref 3.5–5.2)
SODIUM: 140 mmol/L (ref 134–144)

## 2017-12-15 ENCOUNTER — Inpatient Hospital Stay: Payer: Medicare Other | Attending: Radiation Oncology

## 2017-12-15 DIAGNOSIS — Z79899 Other long term (current) drug therapy: Secondary | ICD-10-CM | POA: Diagnosis not present

## 2017-12-15 DIAGNOSIS — D509 Iron deficiency anemia, unspecified: Secondary | ICD-10-CM | POA: Insufficient documentation

## 2017-12-15 DIAGNOSIS — C61 Malignant neoplasm of prostate: Secondary | ICD-10-CM | POA: Diagnosis not present

## 2017-12-15 LAB — PSA: PROSTATIC SPECIFIC ANTIGEN: 0.01 ng/mL (ref 0.00–4.00)

## 2017-12-22 ENCOUNTER — Ambulatory Visit
Admission: RE | Admit: 2017-12-22 | Discharge: 2017-12-22 | Disposition: A | Discharge status: Home / Self Care | Payer: Medicare Other | Source: Ambulatory Visit | Attending: Radiation Oncology | Admitting: Radiation Oncology

## 2017-12-22 ENCOUNTER — Encounter: Payer: Self-pay | Admitting: Radiation Oncology

## 2017-12-22 ENCOUNTER — Other Ambulatory Visit: Payer: Self-pay

## 2017-12-22 VITALS — BP 120/72 | HR 63 | Temp 96.3°F | Wt 223.2 lb

## 2017-12-22 DIAGNOSIS — C61 Malignant neoplasm of prostate: Secondary | ICD-10-CM | POA: Diagnosis not present

## 2017-12-22 DIAGNOSIS — Z923 Personal history of irradiation: Secondary | ICD-10-CM | POA: Diagnosis not present

## 2017-12-22 NOTE — Progress Notes (Signed)
Radiation Oncology Follow up Note  Name: Jonathan Burns   Date:   12/22/2017 MRN:  [id number] DOB: 03/09/1945    This 72 y.o. male presents to the clinic today for 4 month follow-up status post I MRT for radiation therapy for stage IIB Gleason 8 adenocarcinoma the prostate.  REFERRING PROVIDER: Guadalupe Maple, MD  HPI: patient is a 72 year old male now out 4 months having completed IM RT radiation therapy to both his prostate and pelvic nodes for stage IIB Gleason 8 adenocarcinoma the prostate. Seen today in routine follow-up he is doing well. He specifically denies any increased lower urinary tract symptoms diarrhea or fatigue. His recent PSA performed last week was 0.01 he is currently on A DT therapy..  COMPLICATIONS OF TREATMENT: none  FOLLOW UP COMPLIANCE: keeps appointments   PHYSICAL EXAM:  BP 120/72 (BP Location: Right Arm, Patient Position: Sitting)   Pulse 63   Temp (!) 96.3 F (35.7 C) (Tympanic)   Wt 223 lb 3.5 oz (101.2 kg)   BMI 32.03 kg/m  Well-developed well-nourished patient in NAD. HEENT reveals PERLA, EOMI, discs not visualized.  Oral cavity is clear. No oral mucosal lesions are identified. Neck is clear without evidence of cervical or supraclavicular adenopathy. Lungs are clear to A&P. Cardiac examination is essentially unremarkable with regular rate and rhythm without murmur rub or thrill. Abdomen is benign with no organomegaly or masses noted. Motor sensory and DTR levels are equal and symmetric in the upper and lower extremities. Cranial nerves II through XII are grossly intact. Proprioception is intact. No peripheral adenopathy or edema is identified. No motor or sensory levels are noted. Crude visual fields are within normal range.  RADIOLOGY RESULTS: no current films for review  PLAN: at the present time patient is doing well under good biochemical control his prostate cancer he continues on androgen deprivation therapy through Dr. Cherrie Gauze office. I have asked  to see him back in 6 months with a PSA prior to his visit. Patient is to call at anytime with any concerns. His side effect profile is excellent.  I would like to take this opportunity to thank you for allowing me to participate in the care of your patient.[name], MD

## 2018-01-01 DIAGNOSIS — H43813 Vitreous degeneration, bilateral: Secondary | ICD-10-CM | POA: Diagnosis not present

## 2018-01-01 DIAGNOSIS — B0050 Herpesviral ocular disease, unspecified: Secondary | ICD-10-CM | POA: Diagnosis not present

## 2018-01-01 DIAGNOSIS — H33312 Horseshoe tear of retina without detachment, left eye: Secondary | ICD-10-CM | POA: Diagnosis not present

## 2018-01-01 DIAGNOSIS — H353131 Nonexudative age-related macular degeneration, bilateral, early dry stage: Secondary | ICD-10-CM | POA: Diagnosis not present

## 2018-02-02 ENCOUNTER — Ambulatory Visit (INDEPENDENT_AMBULATORY_CARE_PROVIDER_SITE_OTHER): Payer: Medicare Other | Admitting: Family Medicine

## 2018-02-02 ENCOUNTER — Encounter: Payer: Self-pay | Admitting: Family Medicine

## 2018-02-02 VITALS — BP 118/73 | HR 75 | Temp 97.9°F | Wt 203.0 lb

## 2018-02-02 DIAGNOSIS — F419 Anxiety disorder, unspecified: Secondary | ICD-10-CM | POA: Diagnosis not present

## 2018-02-02 DIAGNOSIS — M65331 Trigger finger, right middle finger: Secondary | ICD-10-CM | POA: Diagnosis not present

## 2018-02-02 DIAGNOSIS — I10 Essential (primary) hypertension: Secondary | ICD-10-CM | POA: Diagnosis not present

## 2018-02-02 MED ORDER — MELOXICAM 15 MG PO TABS: 15.0000 mg | ORAL_TABLET | Freq: Every day | ORAL | 3 refills | Status: DC

## 2018-02-02 MED ORDER — LORAZEPAM 1 MG PO TABS: ORAL_TABLET | ORAL | 0 refills | Status: DC

## 2018-02-02 NOTE — Assessment & Plan Note (Signed)
Discussed trigger finger care and treatment will use meloxicam

## 2018-02-02 NOTE — Progress Notes (Signed)
BP 118/73   Pulse 75   Temp 97.9 F (36.6 C) (Oral)   Wt 203 lb (92.1 kg)   SpO2 96%   BMI 29.13 kg/m    Subjective:    Patient ID: Jonathan Burns, male    DOB: 06-15-1945, 72 y.o.   MRN: [id number]  HPI: Jonathan Burns is a 72 y.o. male  Chief Complaint  Patient presents with  . Follow-up  . Hypertension  . Hand Pain    RIght hand, middle 2 fingers.   Patient follow-up hypertension doing well takes hydrochlorothiazide without problems or issues no issues from blood pressure medications and good blood pressure control here and getting good readings at home also similar to what we have here. Patient also is noted some trigger finger changes of his right third and fourth fingers.  This is been ongoing for 2 months or so.  Has not tried anything. Did not get a prescription for lorazepam last time will give Rx for lorazepam at this time.  Relevant past medical, surgical, family and social history reviewed and updated as indicated. Interim medical history since our last visit reviewed. Allergies and medications reviewed and updated.  Review of Systems  Constitutional: Negative.   Respiratory: Negative.   Cardiovascular: Negative.     Per HPI unless specifically indicated above     Objective:    BP 118/73   Pulse 75   Temp 97.9 F (36.6 C) (Oral)   Wt 203 lb (92.1 kg)   SpO2 96%   BMI 29.13 kg/m   Wt Readings from Last 3 Encounters:  02/02/18 203 lb (92.1 kg)  12/22/17 223 lb 3.5 oz (101.2 kg)  12/02/17 220 lb 9.6 oz (100.1 kg)    Physical Exam Constitutional:      Appearance: He is well-developed.  HENT:     Head: Normocephalic and atraumatic.  Eyes:     Conjunctiva/sclera: Conjunctivae normal.  Neck:     Musculoskeletal: Normal range of motion.  Cardiovascular:     Rate and Rhythm: Normal rate and regular rhythm.     Heart sounds: Normal heart sounds.  Pulmonary:     Effort: Pulmonary effort is normal.     Breath sounds: Normal breath sounds.    Musculoskeletal: Normal range of motion.     Comments: Triggering right third and fourth fingers  Skin:    Findings: No erythema.  Neurological:     Mental Status: He is alert and oriented to person, place, and time.  Psychiatric:        Behavior: Behavior normal.        Thought Content: Thought content normal.        Judgment: Judgment normal.     Results for orders placed or performed in visit on 12/15/17  PSA  Result Value Ref Range   Prostatic Specific Antigen 0.01 0.00 - 4.00 ng/mL      Assessment & Plan:   Problem List Items Addressed This Visit      Cardiovascular and Mediastinum   Hypertension - Primary    Discuss hypertension doing well will check BMP      Relevant Orders   Basic metabolic panel     Musculoskeletal and Integument   Trigger finger, right middle finger    Discussed trigger finger care and treatment will use meloxicam        Other   Acute anxiety    Discussed and will give refill on lorazepam      Relevant Medications  LORazepam (ATIVAN) 1 MG tablet       Follow up plan: Return in about 6 months (around 08/04/2018).

## 2018-02-02 NOTE — Assessment & Plan Note (Signed)
Discuss hypertension doing well will check BMP

## 2018-02-02 NOTE — Assessment & Plan Note (Signed)
Discussed and will give refill on lorazepam

## 2018-02-03 ENCOUNTER — Encounter: Payer: Self-pay | Admitting: Family Medicine

## 2018-02-03 LAB — BASIC METABOLIC PANEL
BUN/Creatinine Ratio: 20 (ref 10–24)
BUN: 17 mg/dL (ref 8–27)
CALCIUM: 9.7 mg/dL (ref 8.6–10.2)
CHLORIDE: 100 mmol/L (ref 96–106)
CO2: 25 mmol/L (ref 20–29)
Creatinine, Ser: 0.87 mg/dL (ref 0.76–1.27)
GFR calc Af Amer: 98 mL/min/{1.73_m2} (ref >59)
GFR calc non Af Amer: 85 mL/min/{1.73_m2} (ref >59)
GLUCOSE: 110 mg/dL — AB (ref 65–99)
POTASSIUM: 4.5 mmol/L (ref 3.5–5.2)
Sodium: 141 mmol/L (ref 134–144)

## 2018-03-24 ENCOUNTER — Other Ambulatory Visit: Payer: Self-pay | Admitting: Family Medicine

## 2018-04-13 ENCOUNTER — Ambulatory Visit: Payer: Medicare Other | Admitting: Urology

## 2018-04-16 ENCOUNTER — Ambulatory Visit: Payer: Medicare Other | Admitting: Urology

## 2018-04-20 ENCOUNTER — Ambulatory Visit: Payer: Self-pay

## 2018-04-22 ENCOUNTER — Telehealth: Payer: Self-pay | Admitting: Urology

## 2018-04-22 ENCOUNTER — Ambulatory Visit (INDEPENDENT_AMBULATORY_CARE_PROVIDER_SITE_OTHER): Payer: Medicare Other

## 2018-04-22 VITALS — BP 133/60 | HR 63 | Resp 16 | Ht 70.0 in | Wt 225.8 lb

## 2018-04-22 DIAGNOSIS — Z0000 Encounter for general adult medical examination without abnormal findings: Secondary | ICD-10-CM | POA: Diagnosis not present

## 2018-04-22 NOTE — Progress Notes (Signed)
Subjective:   Jonathan Burns is a 73 y.o. male who presents for Medicare Annual/Subsequent preventive examination.  Review of Systems:  Cardiac Risk Factors include: advanced age (>30men, >28 women);hypertension;dyslipidemia;male gender;obesity (BMI >30kg/m2);smoking/ tobacco exposure     Objective:    Vitals: BP 133/60 (BP Location: Left Arm, Patient Position: Sitting, Cuff Size: Normal)   Pulse 63   Resp 16   Ht 5\' 10"  (1.778 m)   Wt 225 lb 12.8 oz (102.4 kg)   BMI 32.40 kg/m   Body mass index is 32.4 kg/m.  Advanced Directives 04/22/2018 12/22/2017 09/17/2017 04/14/2017 01/30/2017 09/28/2014  Does Patient Have a Medical Advance Directive? Yes No No Yes No Yes  Type of Paramedic of [place];Living will - - [place];Living will - [place]  Does patient want to make changes to medical advance directive? - - - - - No - Patient declined  Copy of [place] in Chart? Yes - validated most recent copy scanned in chart (See row information) - - Yes - -  Would patient like information on creating a medical advance directive? - No - Patient declined No - Patient declined - No - Patient declined -    Tobacco Social History   Tobacco Use  Smoking Status Former Smoker  . Packs/day: 1.00  . Years: 30.00  . Pack years: 30.00  . Types: Cigarettes  . Last attempt to quit: 08/17/1986  . Years since quitting: 31.7  Smokeless Tobacco Never Used     Counseling given: Not Answered   Clinical Intake:  Pre-visit preparation completed: Yes  Pain : No/denies pain     Nutritional Status: BMI > 30  Obese Nutritional Risks: None Diabetes: No  How often do you need to have someone help you when you read instructions, pamphlets, or other written materials from your doctor or pharmacy?: 1 - Never What is the last grade level you completed in school?: 2 years college, associates degree  Interpreter Needed?:  No  Information entered by :: Raymona Boss,LPN   Past Medical History:  Diagnosis Date  . Anemia   . BPH (benign prostatic hypertrophy)   . Chronic GI bleeding   . COPD (chronic obstructive pulmonary disease) ([place])   . Hyperlipidemia   . Hypertension   . Prostate nodule   . PUD (peptic ulcer disease)   . Rising PSA level    Past Surgical History:  Procedure Laterality Date  . COLONOSCOPY  2015  . ESOPHAGOGASTRODUODENOSCOPY  2015   Family History  Problem Relation Age of Onset  . Cancer Mother        breast  . Diabetes Mother   . Aneurysm Father   . Kidney disease Neg Hx   . Prostate cancer Neg Hx   . Kidney cancer Neg Hx    Social History   Socioeconomic History  . Marital status: Married    Spouse name: Not on file  . Number of children: Not on file  . Years of education: Not on file  . Highest education level: Associate degree: academic program  Occupational History  . Not on file  Social Needs  . Financial resource strain: Not hard at all  . Food insecurity:    Worry: Never true    Inability: Never true  . Transportation needs:    Medical: No    Non-medical: No  Tobacco Use  . Smoking status: Former Smoker    Packs/day: 1.00  Years: 30.00    Pack years: 30.00    Types: Cigarettes    Last attempt to quit: 08/17/1986    Years since quitting: 31.7  . Smokeless tobacco: Never Used  Substance and Sexual Activity  . Alcohol use: Yes    Alcohol/week: 0.0 standard drinks    Comment: occasional  . Drug use: No  . Sexual activity: Not on file  Lifestyle  . Physical activity:    Days per week: 0 days    Minutes per session: 0 min  . Stress: Not at all  Relationships  . Social connections:    Talks on phone: More than three times a week    Gets together: More than three times a week    Attends religious service: More than 4 times per year    Active member of club or organization: No    Attends meetings of clubs or organizations: Never     Relationship status: Married  Other Topics Concern  . Not on file  Social History Narrative   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    Involved in [place] activities     Outpatient Encounter Medications as of 04/22/2018  Medication Sig  . amLODipine (NORVASC) 10 MG tablet Take 1 tablet (10 mg total) by mouth daily.  . Calcium Carbonate-Vitamin D3 (CALCIUM 600+D3) 600-400 MG-UNIT TABS Take 1 tablet by mouth daily.  . carvedilol (COREG) 25 MG tablet Take 1 tablet (25 mg total) by mouth 2 (two) times daily with a meal.  . Ferrous Sulfate (IRON) 325 (65 FE) MG TABS Take 1 tablet by mouth daily. (Patient taking differently: Take 2 tablets by mouth daily. )  . hydrochlorothiazide (HYDRODIURIL) 25 MG tablet Take 1 tablet (25 mg total) by mouth daily.  Marland Kitchen LORazepam (ATIVAN) 1 MG tablet TAKE ONE TABLET DAILY IF NEEDED FOR ANXIETY.  Marland Kitchen losartan (COZAAR) 100 MG tablet Take 1 tablet (100 mg total) by mouth daily.  . meloxicam (MOBIC) 15 MG tablet Take 1 tablet (15 mg total) by mouth daily.  . Multiple Vitamins-Minerals (MENS MULTIVITAMIN PLUS PO) Take by mouth daily.  . Omega-3 Fatty Acids (FISH OIL) 1000 MG CAPS Take by mouth.  . pantoprazole (PROTONIX) 40 MG tablet Take 1 tablet (40 mg total) by mouth daily.  . tamsulosin (FLOMAX) 0.4 MG CAPS capsule Take 1 capsule (0.4 mg total) by mouth daily.   No facility-administered encounter medications on file as of 04/22/2018.     Activities of Daily Living In your present state of health, do you have any difficulty performing the following activities: 04/22/2018  Hearing? N  Vision? N  Difficulty concentrating or making decisions? N  Walking or climbing stairs? N  Dressing or bathing? N  Doing errands, shopping? N  Preparing Food and eating ? N  Using the Toilet? N  In the past six months, have you accidently leaked urine? N  Do you have problems with loss of bowel control? N  Managing your Medications? N  Managing your Finances? N  Housekeeping or  managing your Housekeeping? N  Some recent data might be hidden    Patient Care Team: Guadalupe Maple, MD as PCP - General (Family Medicine) Hollice Espy, MD as Consulting Physician (Urology) Josefine Class, MD as Referring Physician (Gastroenterology) Laneta Simmers as Physician Assistant (Urology) [name], MD as Referring Physician (Radiation Oncology)   Assessment:   This is a routine wellness examination for [place].  Exercise Activities and Dietary recommendations Current Exercise Habits: The patient does  not participate in regular exercise at present, Exercise limited by: None identified  Goals    . DIET - INCREASE WATER INTAKE     Recommend drinking at least 6-8 glasses of water a day       Fall Risk Fall Risk  04/22/2018 02/02/2018 05/27/2017 04/14/2017 10/07/2016  Falls in the past year? 0 0 No No No  Follow up - Falls evaluation completed - - -   FALL RISK PREVENTION PERTAINING TO THE HOME:  Any stairs in or around the home? Yes  If so, are there any without handrails? No   Home free of loose throw rugs in walkways, pet beds, electrical cords, etc? Yes  Adequate lighting in your home to reduce risk of falls? Yes   ASSISTIVE DEVICES UTILIZED TO PREVENT FALLS:  Life alert? No  Use of a cane, walker or w/c? No  Grab bars in the bathroom? Yes  Shower chair or bench in shower? No  Elevated toilet seat or a handicapped toilet? Yes   DME ORDERS:  DME order needed?  No   TIMED UP AND GO:  Was the test performed? Yes .  Length of time to ambulate 10 feet: 11 sec.   GAIT:  Appearance of gait: Gait stead-fast without the use of an assistive device.  Education: Fall risk prevention has been discussed.  Intervention(s) required? No    Depression Screen PHQ 2/9 Scores 04/22/2018 09/17/2017 05/27/2017 04/14/2017  PHQ - 2 Score 0 0 0 0  PHQ- 9 Score - - - 1    Cognitive Function     6CIT Screen 04/22/2018 04/14/2017  What Year? 0 points 0  points  What month? 0 points 0 points  What time? 0 points 0 points  Count back from 20 0 points 0 points  Months in reverse 0 points 0 points  Repeat phrase 0 points 0 points  Total Score 0 0    Immunization History  Administered Date(s) Administered  . Influenza,inj,Quad PF,6+ Mos 11/22/2016  . Influenza-Unspecified 02/15/2014, 11/29/2014, 12/11/2015, 11/26/2017  . Pneumococcal Conjugate-13 02/15/2014  . Pneumococcal Polysaccharide-23 12/19/2005, 09/20/2015  . Td 11/20/2004  . Tdap 09/20/2015  . Zoster 08/06/2011    Qualifies for Shingles Vaccine? Yes  Zostavax completed 08/06/2011. Due for Shingrix. Education has been provided regarding the importance of this vaccine. Pt has been advised to call insurance company to determine out of pocket expense. Advised may also receive vaccine at local pharmacy or Health Dept. Verbalized acceptance and understanding.  Tdap: up to date   Flu Vaccine: up to date   Pneumococcal Vaccine: up to date   Screening Tests Health Maintenance  Topic Date Due  . COLONOSCOPY  03/17/2023  . TETANUS/TDAP  09/19/2025  . INFLUENZA VACCINE  Completed  . Hepatitis C Screening  Completed  . PNA vac Low Risk Adult  Completed   Cancer Screenings:  Colorectal Screening: Completed 03/16/2013 Repeat every 10 years   Lung Cancer Screening: (Low Dose CT Chest recommended if Age 95-80 years, 30 pack-year currently smoking OR have quit w/in 15years.) does not qualify.     Additional Screening:  Hepatitis C Screening: does qualify; Completed 02/23/2015  Vision Screening: Recommended annual ophthalmology exams for early detection of glaucoma and other disorders of the eye. Is the patient up to date with their annual eye exam?  Yes  Who is the provider or what is the name of the office in which the pt attends annual eye exams? Dr.nice   Dental Screening: Recommended annual  dental exams for proper oral hygiene  Community Resource Referral:  CRR required  this visit?  No     Plan:    I have personally reviewed and addressed the Medicare Annual Wellness questionnaire and have noted the following in the patient's chart:  A. Medical and social history B. Use of alcohol, tobacco or illicit drugs  C. Current medications and supplements D. Functional ability and status E.  Nutritional status F.  Physical activity G. Advance directives H. List of other physicians I.  Hospitalizations, surgeries, and ER visits in previous 12 months J.  [place] such as hearing and vision if needed, cognitive and depression L. Referrals and appointments   In addition, I have reviewed and discussed with patient certain preventive protocols, quality metrics, and best practice recommendations. A written personalized care plan for preventive services as well as general preventive health recommendations were provided to patient.   Signed,  Tyler Aas, LPN Nurse Health Advisor   Nurse Notes:none

## 2018-04-22 NOTE — Patient Instructions (Signed)
[name] , Thank you for taking time to come for your Medicare Wellness Visit. I appreciate your ongoing commitment to your health goals. Please review the following plan we discussed and let me know if I can assist you in the future.   Screening recommendations/referrals: Colonoscopy: completed 03/24/2013 Recommended yearly ophthalmology/optometry visit for glaucoma screening and checkup Recommended yearly dental visit for hygiene and checkup  Vaccinations: Influenza vaccine: up to date  Pneumococcal vaccine: up to date  Tdap vaccine: up to date  Shingles vaccine: shingrix eligible, check with your insurance company for coverage   Advanced directives: copy on file   Conditions/risks identified: continue drinking at least 6 glasses of water a day   Next appointment: Follow up in one year for your annual wellness exam.   Preventive Care 65 Years and Older, Male Preventive care refers to lifestyle choices and visits with your health care provider that can promote health and wellness. What does preventive care include?  A yearly physical exam. This is also called an annual well check.  Dental exams once or twice a year.  Routine eye exams. Ask your health care provider how often you should have your eyes checked.  Personal lifestyle choices, including:  Daily care of your teeth and gums.  Regular physical activity.  Eating a healthy diet.  Avoiding tobacco and drug use.  Limiting alcohol use.  Practicing safe sex.  Taking low doses of aspirin every day.  Taking vitamin and mineral supplements as recommended by your health care provider. What happens during an annual well check? The services and screenings done by your health care provider during your annual well check will depend on your age, overall health, lifestyle risk factors, and family history of disease. Counseling  Your health care provider may ask you questions about your:  Alcohol use.  Tobacco  use.  Drug use.  Emotional well-being.  Home and relationship well-being.  Sexual activity.  Eating habits.  History of falls.  Memory and ability to understand (cognition).  Work and work Statistician. Screening  You may have the following tests or measurements:  Height, weight, and BMI.  Blood pressure.  Lipid and cholesterol levels. These may be checked every 5 years, or more frequently if you are over 75 years old.  Skin check.  Lung cancer screening. You may have this screening every year starting at age 75 if you have a 30-pack-year history of smoking and currently smoke or have quit within the past 15 years.  Fecal occult blood test (FOBT) of the stool. You may have this test every year starting at age 75.  Flexible sigmoidoscopy or colonoscopy. You may have a sigmoidoscopy every 5 years or a colonoscopy every 10 years starting at age 75.  Prostate cancer screening. Recommendations will vary depending on your family history and other risks.  Hepatitis C blood test.  Hepatitis B blood test.  Sexually transmitted disease (STD) testing.  Diabetes screening. This is done by checking your blood sugar (glucose) after you have not eaten for a while (fasting). You may have this done every 1-3 years.  Abdominal aortic aneurysm (AAA) screening. You may need this if you are a current or former smoker.  Osteoporosis. You may be screened starting at age 75 if you are at high risk. Talk with your health care provider about your test results, treatment options, and if necessary, the need for more tests. Vaccines  Your health care provider may recommend certain vaccines, such as:  Influenza vaccine. This  is recommended every year.  Tetanus, diphtheria, and acellular pertussis (Tdap, Td) vaccine. You may need a Td booster every 10 years.  Zoster vaccine. You may need this after age 75.  Pneumococcal 13-valent conjugate (PCV13) vaccine. One dose is recommended after age  75.  Pneumococcal polysaccharide (PPSV23) vaccine. One dose is recommended after age 75. Talk to your health care provider about which screenings and vaccines you need and how often you need them. This information is not intended to replace advice given to you by your health care provider. Make sure you discuss any questions you have with your health care provider. Document Released: 03/03/2015 Document Revised: 10/25/2015 Document Reviewed: 12/06/2014 Elsevier Interactive Patient Education  2017 [place] Prevention in the Home Falls can cause injuries. They can happen to people of all ages. There are many things you can do to make your home safe and to help prevent falls. What can I do on the outside of my home?  Regularly fix the edges of walkways and driveways and fix any cracks.  Remove anything that might make you trip as you walk through a door, such as a raised step or threshold.  Trim any bushes or trees on the path to your home.  Use bright outdoor lighting.  Clear any walking paths of anything that might make someone trip, such as rocks or tools.  Regularly check to see if handrails are loose or broken. Make sure that both sides of any steps have handrails.  Any raised decks and porches should have guardrails on the edges.  Have any leaves, snow, or ice cleared regularly.  Use sand or salt on walking paths during winter.  Clean up any spills in your garage right away. This includes oil or grease spills. What can I do in the bathroom?  Use night lights.  Install grab bars by the toilet and in the tub and shower. Do not use towel bars as grab bars.  Use non-skid mats or decals in the tub or shower.  If you need to sit down in the shower, use a plastic, non-slip stool.  Keep the floor dry. Clean up any water that spills on the floor as soon as it happens.  Remove soap buildup in the tub or shower regularly.  Attach bath mats securely with double-sided  non-slip rug tape.  Do not have throw rugs and other things on the floor that can make you trip. What can I do in the bedroom?  Use night lights.  Make sure that you have a light by your bed that is easy to reach.  Do not use any sheets or blankets that are too big for your bed. They should not hang down onto the floor.  Have a firm chair that has side arms. You can use this for support while you get dressed.  Do not have throw rugs and other things on the floor that can make you trip. What can I do in the kitchen?  Clean up any spills right away.  Avoid walking on wet floors.  Keep items that you use a lot in easy-to-reach places.  If you need to reach something above you, use a strong step stool that has a grab bar.  Keep electrical cords out of the way.  Do not use floor polish or wax that makes floors slippery. If you must use wax, use non-skid floor wax.  Do not have throw rugs and other things on the floor that can make you  trip. What can I do with my stairs?  Do not leave any items on the stairs.  Make sure that there are handrails on both sides of the stairs and use them. Fix handrails that are broken or loose. Make sure that handrails are as long as the stairways.  Check any carpeting to make sure that it is firmly attached to the stairs. Fix any carpet that is loose or worn.  Avoid having throw rugs at the top or bottom of the stairs. If you do have throw rugs, attach them to the floor with carpet tape.  Make sure that you have a light switch at the top of the stairs and the bottom of the stairs. If you do not have them, ask someone to add them for you. What else can I do to help prevent falls?  Wear shoes that:  Do not have high heels.  Have rubber bottoms.  Are comfortable and fit you well.  Are closed at the toe. Do not wear sandals.  If you use a stepladder:  Make sure that it is fully opened. Do not climb a closed stepladder.  Make sure that both  sides of the stepladder are locked into place.  Ask someone to hold it for you, if possible.  Clearly mark and make sure that you can see:  Any grab bars or handrails.  First and last steps.  Where the edge of each step is.  Use tools that help you move around (mobility aids) if they are needed. These include:  Canes.  Walkers.  Scooters.  Crutches.  Turn on the lights when you go into a dark area. Replace any light bulbs as soon as they burn out.  Set up your furniture so you have a clear path. Avoid moving your furniture around.  If any of your floors are uneven, fix them.  If there are any pets around you, be aware of where they are.  Review your medicines with your doctor. Some medicines can make you feel dizzy. This can increase your chance of falling. Ask your doctor what other things that you can do to help prevent falls. This information is not intended to replace advice given to you by your health care provider. Make sure you discuss any questions you have with your health care provider. Document Released: 12/01/2008 Document Revised: 07/13/2015 Document Reviewed: 03/11/2014 Elsevier Interactive Patient Education  2017 [employer].

## 2018-04-22 NOTE — Telephone Encounter (Signed)
NO PA REQUIRED FOR [facility] REF# [id number] 04-22-18 MBELL

## 2018-05-05 NOTE — Progress Notes (Signed)
05/06/2018 1:43 PM   Jonathan Burns [birth year] [id number]  Referring provider: Guadalupe Maple, MD [address] [place], [place] [address]  Chief Complaint  Patient presents with  . Follow-up    HPI: Jonathan Burns is a 73 y.o. male Caucasian with prostate cancer, BPH with LUTS and an incidental finding of AMH who presents today for 12 month follow-up.  Prostate cancer Patient was originally referred to [place] for a possible prostate nodule found by his primary care physician.  It had not been appreciated on subsequent exams until 04/10/2017.  He does not have a family history of prostate cancer.  On 04/15/2017, patient underwent a biopsy for an elevated PSA of 6.0, and a right-sided prostate nodule. TRUS volume 33 cc.  Prostate biopsy revealed 12 of 12 cores positive including lesions up to Gleason 4+4 involving 80% of the tissue on the right lateral mid core.  Patient completed 07/2017 IM RT radiation therapy to both his prostate and pelvic nodes for stage IIB Gleason 8 adenocarcinoma for the prostate.  He is currently on ADT therapy.  His most recent PSA was 0.01 ng/mL on 12/15/2017.  BPH WITH LUTS  (prostate and/or bladder) IPSS score: 7/2 PVR: 000 mL  Previous score: 2/1  Major complaint(s): Nocturia x 3.  Denies any dysuria, hematuria or suprapubic pain.   Currently taking: Tamsulosin.  He reports today (05/06/2018) that he rarely sleeps all night, but does manage to get around 6 hours before he wakes up the first time and then frequently needs to get up to urinate afterwards until it is time to get up.  Patient does limit his overall liquid intake in the evening, but not limit his caffine intake.  He is not bothered sufficiently to consider reducing his caffine.  Patient admits sleep apnea, and sleeps with a CPAP.  Patient is not bothered sufficiently to seek medical intervention at this time.   Denies any recent fevers, chills, nausea or vomiting.  He does not have a family  history of PCa, but does have a personal history of PCa.  IPSS    Row Name 05/06/18 1300         International Prostate Symptom Score   How often have you had the sensation of not emptying your bladder?  Not at All     How often have you had to urinate less than every two hours?  Less than 1 in 5 times     How often have you found you stopped and started again several times when you urinated?  Not at All     How often have you found it difficult to postpone urination?  Less than 1 in 5 times     How often have you had a weak urinary stream?  Less than half the time     How often have you had to strain to start urination?  Not at All     How many times did you typically get up at night to urinate?  3 Times     Total IPSS Score  7       Quality of Life due to urinary symptoms   If you were to spend the rest of your life with your urinary condition just the way it is now how would you feel about that?  Mixed       Score:  1-7 Mild 8-19 Moderate 20-35 Severe  History of hematuria Patient was found to have 3-10 RBC's on a routine  physical exam on 03/25/2016 and advised of the risk of malignancies.  Subsequent UA's did not demonstrate AMH and patient did not want to pursue hematuria workup.  He is a former smoker, with a 1 ppd history.  Quit 30 years ago.  He is not exposed to secondhand smoke.  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  No reports of gross hematuria.  PMH: Past Medical History:  Diagnosis Date  . Anemia   . BPH (benign prostatic hypertrophy)   . Chronic GI bleeding   . COPD (chronic obstructive pulmonary disease) ([place])   . Hyperlipidemia   . Hypertension   . Prostate nodule   . PUD (peptic ulcer disease)   . Rising PSA level     Surgical History: Past Surgical History:  Procedure Laterality Date  . COLONOSCOPY  2015  . ESOPHAGOGASTRODUODENOSCOPY  2015    Home Medications:  Allergies as of 05/06/2018   No Known Allergies     Medication List        Accurate as of May 06, 2018  1:43 PM. Always use your most recent med list.        amLODipine 10 MG tablet Commonly known as:  NORVASC Take 1 tablet (10 mg total) by mouth daily.   Calcium 600+D3 600-400 MG-UNIT Tabs Generic drug:  Calcium Carbonate-Vitamin D3 Take 1 tablet by mouth daily.   carvedilol 25 MG tablet Commonly known as:  COREG Take 1 tablet (25 mg total) by mouth 2 (two) times daily with a meal.   Fish Oil 1000 MG Caps Take by mouth.   hydrochlorothiazide 25 MG tablet Commonly known as:  HYDRODIURIL Take 1 tablet (25 mg total) by mouth daily.   Iron 325 (65 Fe) MG Tabs Take 1 tablet by mouth daily.   LORazepam 1 MG tablet Commonly known as:  ATIVAN TAKE ONE TABLET DAILY IF NEEDED FOR ANXIETY.   losartan 100 MG tablet Commonly known as:  COZAAR Take 1 tablet (100 mg total) by mouth daily.   meloxicam 15 MG tablet Commonly known as:  MOBIC Take 1 tablet (15 mg total) by mouth daily.   MENS MULTIVITAMIN PLUS PO Take by mouth daily.   pantoprazole 40 MG tablet Commonly known as:  PROTONIX Take 1 tablet (40 mg total) by mouth daily.   tamsulosin 0.4 MG Caps capsule Commonly known as:  FLOMAX Take 1 capsule (0.4 mg total) by mouth daily.       Allergies: No Known Allergies  Family History: Family History  Problem Relation Age of Onset  . Cancer Mother        breast  . Diabetes Mother   . Aneurysm Father   . Kidney disease Neg Hx   . Prostate cancer Neg Hx   . Kidney cancer Neg Hx     Social History:  reports that he quit smoking about 31 years ago. His smoking use included cigarettes. He has a 30.00 pack-year smoking history. He has never used smokeless tobacco. He reports current alcohol use. He reports that he does not use drugs.  ROS: UROLOGY Frequent Urination?: No Hard to postpone urination?: No Burning/pain with urination?: No Get up at night to urinate?: Yes Leakage of urine?: No Urine stream starts and stops?: No Trouble  starting stream?: No Do you have to strain to urinate?: No Blood in urine?: No Urinary tract infection?: No Sexually transmitted disease?: No Injury to kidneys or bladder?: No Painful intercourse?: No Weak stream?: No Erection problems?: No Penile pain?: No  Gastrointestinal Nausea?: No Vomiting?: No Indigestion/heartburn?: Yes Diarrhea?: No Constipation?: No  Constitutional Fever: No Night sweats?: No Weight loss?: No Fatigue?: No  Skin Skin rash/lesions?: No Itching?: No  Eyes Blurred vision?: No Double vision?: No  Ears/Nose/Throat Sore throat?: No Sinus problems?: No  Hematologic/Lymphatic Swollen glands?: No Easy bruising?: Yes  Cardiovascular Leg swelling?: No Chest pain?: No  Respiratory Cough?: No Shortness of breath?: No  Endocrine Excessive thirst?: No  Musculoskeletal Back pain?: No Joint pain?: No  Neurological Headaches?: No Dizziness?: No  Psychologic Depression?: No Anxiety?: No  Physical Exam: BP (!) 141/82   Pulse 69   Ht 5\' 10"  (1.778 m)   Wt 220 lb (99.8 kg)   BMI 31.57 kg/m   Constitutional:  Well nourished. Alert and oriented, No acute distress. HEENT: [place] AT, moist mucus membranes.  Trachea midline Cardiovascular: No clubbing, cyanosis, or edema. Respiratory: Normal respiratory effort, no increased work of breathing. Skin: No rashes, bruises or suspicious lesions. Neurologic: Grossly intact, no focal deficits, moving all 4 extremities. Psychiatric: Normal mood and affect.  Laboratory Data:  Lab Results  Component Value Date   WBC 4.4 07/30/2017   HGB 15.7 07/30/2017   HCT 44.5 07/30/2017   MCV 93.3 07/30/2017   PLT 162 07/30/2017    Lab Results  Component Value Date   CREATININE 0.87 02/02/2018   Lab Results  Component Value Date   TSH 2.420 05/27/2017      Component Value Date/Time   CHOL 202 (H) 12/02/2017 0830   HDL 54 05/27/2017 1134   CHOLHDL 3.8 05/27/2017 1134   VLDL 32 (H) 12/02/2017  0830   LDLCALC 130 (H) 05/27/2017 1134   Lab Results  Component Value Date   AST 36 12/02/2017   Lab Results  Component Value Date   ALT 48 (H) 12/02/2017   PSA History  0.01 ng/mL on 12/15/2017  6.0 ng/mL on 04/10/2017  3.1 ng/mL on 04/05/2016  3.4 ng/mL on 03/25/2016  2.2 ng/mL on 04/07/2015  2.8 ng/mL on 02/23/2015  2.2 ng/mL on 10/10/2014  3.2 ng/mL on 12/15  2.3 ng/mL on 12/14  2.0 ng/mL on 12/13   2.3 ng/mL on 11/12             1.03 ng/mL on 11/10  I have reviewed the labs.  Assessment & Plan:    1. Prostate cancer - Status post IR MT radiation therapy to both his prostate and pelvic nodes for stage IIB Gleason 8 adenocarcinoma for the prostate - Discussed with patient the future steps in his treatment and what his risks of recurrence are, including when the patient might be able to discontinue ADT; patient's questions were answered to his satisfaction - PSA drawn today  2. BPH with LUTS - IPSS score is 7/2, it is worsening - Continue conservative management, avoiding bladder irritants and timed voiding's - RTC based on PSA findings  3. History of hematuria - No reports of gross hematuria    Return in about 6 months (around 11/06/2018) for Lupron, PSA and I PSS .  Zara Council, PA-C  [facility] [address] [place], [place] [address] [phone]  I, Adele Schilder, am acting as a Education administrator for [employer], PA-C.   I have reviewed the above documentation for accuracy and completeness, and I agree with the above.    Zara Council, PA-C

## 2018-05-06 ENCOUNTER — Other Ambulatory Visit: Payer: Self-pay

## 2018-05-06 ENCOUNTER — Ambulatory Visit (INDEPENDENT_AMBULATORY_CARE_PROVIDER_SITE_OTHER): Payer: Medicare Other | Admitting: Urology

## 2018-05-06 ENCOUNTER — Encounter: Payer: Self-pay | Admitting: Urology

## 2018-05-06 VITALS — BP 141/82 | HR 69 | Ht 70.0 in | Wt 220.0 lb

## 2018-05-06 DIAGNOSIS — C61 Malignant neoplasm of prostate: Secondary | ICD-10-CM

## 2018-05-06 DIAGNOSIS — N138 Other obstructive and reflux uropathy: Secondary | ICD-10-CM

## 2018-05-06 DIAGNOSIS — N401 Enlarged prostate with lower urinary tract symptoms: Secondary | ICD-10-CM | POA: Diagnosis not present

## 2018-05-06 LAB — BLADDER SCAN AMB NON-IMAGING: Scan Result: 0

## 2018-05-06 MED ORDER — LEUPROLIDE ACETATE (6 MONTH) 45 MG IM KIT
45.0000 mg | PACK | Freq: Once | INTRAMUSCULAR | Status: AC
  Administered 2018-05-06: 45 mg via INTRAMUSCULAR

## 2018-05-07 LAB — PSA: Prostate Specific Ag, Serum: <0.1 ng/mL (ref 0.0–4.0)

## 2018-05-18 ENCOUNTER — Ambulatory Visit: Payer: Medicare Other

## 2018-05-23 ENCOUNTER — Other Ambulatory Visit: Payer: Self-pay | Admitting: Family Medicine

## 2018-06-17 ENCOUNTER — Encounter: Payer: Self-pay | Admitting: *Deleted

## 2018-06-29 ENCOUNTER — Other Ambulatory Visit: Payer: Medicare Other

## 2018-07-06 ENCOUNTER — Ambulatory Visit: Payer: Medicare Other | Admitting: Radiation Oncology

## 2018-07-21 DIAGNOSIS — H353131 Nonexudative age-related macular degeneration, bilateral, early dry stage: Secondary | ICD-10-CM | POA: Diagnosis not present

## 2018-07-21 DIAGNOSIS — B0050 Herpesviral ocular disease, unspecified: Secondary | ICD-10-CM | POA: Diagnosis not present

## 2018-07-21 DIAGNOSIS — H33312 Horseshoe tear of retina without detachment, left eye: Secondary | ICD-10-CM | POA: Diagnosis not present

## 2018-07-21 DIAGNOSIS — H43813 Vitreous degeneration, bilateral: Secondary | ICD-10-CM | POA: Diagnosis not present

## 2018-08-03 ENCOUNTER — Other Ambulatory Visit: Payer: Self-pay | Admitting: Family Medicine

## 2018-08-06 ENCOUNTER — Ambulatory Visit (INDEPENDENT_AMBULATORY_CARE_PROVIDER_SITE_OTHER): Payer: Medicare Other | Admitting: Family Medicine

## 2018-08-06 ENCOUNTER — Encounter: Payer: Self-pay | Admitting: Family Medicine

## 2018-08-06 ENCOUNTER — Other Ambulatory Visit: Payer: Self-pay

## 2018-08-06 VITALS — BP 125/68 | HR 65 | Wt 216.0 lb

## 2018-08-06 DIAGNOSIS — F419 Anxiety disorder, unspecified: Secondary | ICD-10-CM | POA: Diagnosis not present

## 2018-08-06 DIAGNOSIS — J42 Unspecified chronic bronchitis: Secondary | ICD-10-CM | POA: Diagnosis not present

## 2018-08-06 DIAGNOSIS — I10 Essential (primary) hypertension: Secondary | ICD-10-CM

## 2018-08-06 DIAGNOSIS — K922 Gastrointestinal hemorrhage, unspecified: Secondary | ICD-10-CM

## 2018-08-06 DIAGNOSIS — Z7189 Other specified counseling: Secondary | ICD-10-CM

## 2018-08-06 DIAGNOSIS — M65331 Trigger finger, right middle finger: Secondary | ICD-10-CM | POA: Diagnosis not present

## 2018-08-06 DIAGNOSIS — C61 Malignant neoplasm of prostate: Secondary | ICD-10-CM

## 2018-08-06 DIAGNOSIS — E785 Hyperlipidemia, unspecified: Secondary | ICD-10-CM

## 2018-08-06 MED ORDER — MELOXICAM 15 MG PO TABS: 15.0000 mg | ORAL_TABLET | Freq: Every day | ORAL | 4 refills | Status: DC

## 2018-08-06 MED ORDER — LORAZEPAM 1 MG PO TABS: ORAL_TABLET | ORAL | 0 refills | Status: DC

## 2018-08-06 MED ORDER — CARVEDILOL 25 MG PO TABS: 25.0000 mg | ORAL_TABLET | Freq: Two times a day (BID) | ORAL | 4 refills | Status: DC

## 2018-08-06 MED ORDER — TAMSULOSIN HCL 0.4 MG PO CAPS: 0.4000 mg | ORAL_CAPSULE | Freq: Every day | ORAL | 4 refills | Status: DC

## 2018-08-06 MED ORDER — PANTOPRAZOLE SODIUM 40 MG PO TBEC: 40.0000 mg | DELAYED_RELEASE_TABLET | Freq: Every day | ORAL | 4 refills | Status: DC

## 2018-08-06 MED ORDER — AMLODIPINE BESYLATE 10 MG PO TABS: 10.0000 mg | ORAL_TABLET | Freq: Every day | ORAL | 4 refills | Status: DC

## 2018-08-06 MED ORDER — LOSARTAN POTASSIUM 100 MG PO TABS: 100.0000 mg | ORAL_TABLET | Freq: Every day | ORAL | 4 refills | Status: DC

## 2018-08-06 MED ORDER — HYDROCHLOROTHIAZIDE 25 MG PO TABS: 25.0000 mg | ORAL_TABLET | Freq: Every day | ORAL | 4 refills | Status: DC

## 2018-08-06 NOTE — Assessment & Plan Note (Signed)
A voluntary discussion about advanced care planning including explanation and discussion of advanced directives was extentively discussed with the patient.  Explained about the healthcare proxy and living will was reviewed and packet with forms with expiration of how to fill them out was given.  Time spent: Encounter 16+ min individuals present: Patient 

## 2018-08-06 NOTE — Assessment & Plan Note (Signed)
The current medical regimen is effective;  continue present plan and medications.  

## 2018-08-06 NOTE — Assessment & Plan Note (Signed)
Last appointment with radiation oncology coming up later next month otherwise doing well

## 2018-08-06 NOTE — Assessment & Plan Note (Signed)
Takes over-the-counter iron

## 2018-08-06 NOTE — Assessment & Plan Note (Signed)
Stable with rare use of lorazepam

## 2018-08-06 NOTE — Assessment & Plan Note (Signed)
Will arrange for orthopedic referral if still bothersome

## 2018-08-06 NOTE — Assessment & Plan Note (Signed)
No complaints

## 2018-08-06 NOTE — Progress Notes (Signed)
BP 125/68   Pulse 65   Wt 216 lb (98 kg)   BMI 30.99 kg/m    Subjective:    Patient ID: Jonathan Burns, male    DOB: October 25, 1945, 73 y.o.   MRN: [id number]  HPI: Jonathan Burns is a 73 y.o. male  Med check  Discussed with patient all in all doing well good blood pressure control. CPAP using faithfully every night with no daytime drowsiness. Doing well with prostate cancer last appointment with Dr. Donella Stade coming up next month.  Still followed by urology. No complaints from COPD or other issues. Is bothered by trigger finger requesting referral.  Relevant past medical, surgical, family and social history reviewed and updated as indicated. Interim medical history since our last visit reviewed. Allergies and medications reviewed and updated.  Review of Systems  Constitutional: Negative.   HENT: Negative.   Eyes: Negative.   Respiratory: Negative.   Cardiovascular: Negative.   Gastrointestinal: Negative.   Endocrine: Negative.   Genitourinary: Negative.   Musculoskeletal: Negative.   Skin: Negative.   Allergic/Immunologic: Negative.   Neurological: Negative.   Hematological: Negative.   Psychiatric/Behavioral: Negative.     Per HPI unless specifically indicated above     Objective:    BP 125/68   Pulse 65   Wt 216 lb (98 kg)   BMI 30.99 kg/m   Wt Readings from Last 3 Encounters:  08/06/18 216 lb (98 kg)  05/06/18 220 lb (99.8 kg)  04/22/18 225 lb 12.8 oz (102.4 kg)    Physical Exam  Results for orders placed or performed in visit on 05/06/18  PSA  Result Value Ref Range   Prostate Specific Ag, Serum <0.1 0.0 - 4.0 ng/mL  Bladder Scan (Post Void Residual) in office  Result Value Ref Range   Scan Result 0       Assessment & Plan:   Problem List Items Addressed This Visit      Cardiovascular and Mediastinum   Hypertension    The current medical regimen is effective;  continue present plan and medications.       Relevant Medications   amLODipine  (NORVASC) 10 MG tablet   losartan (COZAAR) 100 MG tablet   carvedilol (COREG) 25 MG tablet   hydrochlorothiazide (HYDRODIURIL) 25 MG tablet   Other Relevant Orders   Comprehensive metabolic panel   CBC with Differential/Platelet   TSH   Urinalysis, Routine w reflex microscopic     Respiratory   COPD (chronic obstructive pulmonary disease) (H[facility])    No complaints        Digestive   Chronic GI bleeding    Takes over-the-counter iron      Relevant Medications   pantoprazole (PROTONIX) 40 MG tablet   Other Relevant Orders   Comprehensive metabolic panel   CBC with Differential/Platelet   TSH   Urinalysis, Routine w reflex microscopic     Musculoskeletal and Integument   Trigger finger, right middle finger - Primary    Will arrange for orthopedic referral if still bothersome      Relevant Orders   Ambulatory referral to Orthopedic Surgery     Genitourinary   Prostate cancer [facility])    Last appointment with radiation oncology coming up later next month otherwise doing well      Relevant Medications   LORazepam (ATIVAN) 1 MG tablet     Other   Hyperlipidemia    The current medical regimen is effective;  continue present plan and medications.  Relevant Medications   amLODipine (NORVASC) 10 MG tablet   losartan (COZAAR) 100 MG tablet   carvedilol (COREG) 25 MG tablet   hydrochlorothiazide (HYDRODIURIL) 25 MG tablet   Other Relevant Orders   Lipid panel   Acute anxiety    Stable with rare use of lorazepam      Relevant Medications   LORazepam (ATIVAN) 1 MG tablet   Other Relevant Orders   TSH   Advanced care planning/counseling discussion    A voluntary discussion about advanced care planning including explanation and discussion of advanced directives was extentively discussed with the patient.  Explained about the healthcare proxy and living will was reviewed and packet with forms with expiration of how to fill them out was given.  Time spent: Encounter 16+  min individuals present: Patient          Follow up plan: Return for Physical exam soon and follow-up 6 months with BMP.

## 2018-08-25 ENCOUNTER — Ambulatory Visit (INDEPENDENT_AMBULATORY_CARE_PROVIDER_SITE_OTHER): Payer: Medicare Other | Admitting: Nurse Practitioner

## 2018-08-25 ENCOUNTER — Encounter: Payer: Self-pay | Admitting: Nurse Practitioner

## 2018-08-25 ENCOUNTER — Other Ambulatory Visit: Payer: Self-pay

## 2018-08-25 VITALS — BP 138/80 | HR 63 | Temp 97.6°F | Ht 68.31 in | Wt 223.0 lb

## 2018-08-25 DIAGNOSIS — E785 Hyperlipidemia, unspecified: Secondary | ICD-10-CM

## 2018-08-25 DIAGNOSIS — Z0000 Encounter for general adult medical examination without abnormal findings: Secondary | ICD-10-CM | POA: Diagnosis not present

## 2018-08-25 DIAGNOSIS — I10 Essential (primary) hypertension: Secondary | ICD-10-CM

## 2018-08-25 DIAGNOSIS — C61 Malignant neoplasm of prostate: Secondary | ICD-10-CM | POA: Diagnosis not present

## 2018-08-25 DIAGNOSIS — F419 Anxiety disorder, unspecified: Secondary | ICD-10-CM | POA: Diagnosis not present

## 2018-08-25 DIAGNOSIS — K922 Gastrointestinal hemorrhage, unspecified: Secondary | ICD-10-CM

## 2018-08-25 DIAGNOSIS — D509 Iron deficiency anemia, unspecified: Secondary | ICD-10-CM | POA: Insufficient documentation

## 2018-08-25 DIAGNOSIS — K219 Gastro-esophageal reflux disease without esophagitis: Secondary | ICD-10-CM | POA: Insufficient documentation

## 2018-08-25 LAB — URINALYSIS, ROUTINE W REFLEX MICROSCOPIC
Bilirubin, UA: NEGATIVE
Glucose, UA: NEGATIVE
Ketones, UA: NEGATIVE
Leukocytes,UA: NEGATIVE
Nitrite, UA: NEGATIVE
Protein,UA: NEGATIVE
RBC, UA: NEGATIVE
Specific Gravity, UA: 1.02 (ref 1.005–1.030)
Urobilinogen, Ur: 0.2 mg/dL (ref 0.2–1.0)
pH, UA: 8.5 — ABNORMAL HIGH (ref 5.0–7.5)

## 2018-08-25 NOTE — Assessment & Plan Note (Signed)
Chronic, ongoing.  Continue current medication regimen, fish oil.  Labs today.

## 2018-08-25 NOTE — Assessment & Plan Note (Signed)
Continue collaboration with [facility] and urology.

## 2018-08-25 NOTE — Patient Instructions (Signed)

## 2018-08-25 NOTE — Assessment & Plan Note (Signed)
Chronic, ongoing with minimal use of Ativa (30 tablets lasts one year).  Continue current medication regimen and monitor use.  If increased use consider switch to SSRI.

## 2018-08-25 NOTE — Assessment & Plan Note (Signed)
Chronic, stable.  At goal at home and in office.  Continue current medication regimen.  Labs today.

## 2018-08-25 NOTE — Progress Notes (Signed)
BP 138/80   Pulse 63   Temp 97.6 F (36.4 C) (Oral)   Ht 5' 8.31" (1.735 m)   Wt 223 lb (101.2 kg)   SpO2 97%   BMI 33.60 kg/m    Subjective:    Patient ID: Jonathan Burns, male    DOB: 1945/11/06, 73 y.o.   MRN: [id number]  HPI: Jonathan Burns is a 73 y.o. male presenting on 08/25/2018 for comprehensive medical examination. Current medical complaints include:none  He currently lives with: wife Interim Problems from his last visit: no   HYPERTENSION / HYPERLIPIDEMIA Continues on Amlodipine, Coreg, HCTZ, and Losartan. Satisfied with current treatment? yes Duration of hypertension: chronic BP monitoring frequency: daily BP range: 120/60's BP medication side effects: no Duration of hyperlipidemia: chronic Cholesterol medication side effects: no Cholesterol supplements: fish oil Medication compliance: good compliance Aspirin: no Recent stressors: no Recurrent headaches: no Visual changes: no Palpitations: no Dyspnea: no Chest pain: no Lower extremity edema: no Dizzy/lightheaded: no   ANXIETY/STRESS Currently takes Ativan 1 MG as needed.  Currently takes once a month, 30 tablets lasts him a whole a year.  Pt is aware of risks of psychoactive medication use to include increased sedation, respiratory suppression, falls, dependence and cardiovascular events.  Pt would like to continue treatment as benefit determined to outweigh risk.   Duration:controlled Anxious mood: no  Excessive worrying: no Irritability: no  Sweating: no Nausea: no Palpitations:no Hyperventilation: no Panic attacks: no Agoraphobia: no  Obscessions/compulsions: no Depressed mood: no Depression screen [facility] 2/9 08/25/2018 04/22/2018 09/17/2017 05/27/2017 04/14/2017  Decreased Interest 0 0 0 0 0  Down, Depressed, Hopeless 0 0 0 0 0  PHQ - 2 Score 0 0 0 0 0  Altered sleeping 0 - - - 0  Tired, decreased energy 0 - - - 1  Change in appetite 0 - - - 0  Feeling bad or failure about yourself  0 - - - 0  Trouble  concentrating 0 - - - 0  Moving slowly or fidgety/restless 0 - - - 0  Suicidal thoughts 0 - - - 0  PHQ-9 Score 0 - - - 1  Difficult doing work/chores Not difficult at all - - - Not difficult at all   Anhedonia: no Weight changes: no Insomnia: none Hypersomnia: no Fatigue/loss of energy: no Feelings of worthlessness: no Feelings of guilt: no Impaired concentration/indecisiveness: no Suicidal ideations: no  Crying spells: no Recent Stressors/Life Changes: no   Relationship problems: no   Family stress: no     Financial stress: no    Job stress: no    Recent death/loss: no  Functional Status Survey: Is the patient deaf or have difficulty hearing?: No Does the patient have difficulty seeing, even when wearing glasses/contacts?: No Does the patient have difficulty concentrating, remembering, or making decisions?: No Does the patient have difficulty walking or climbing stairs?: No Does the patient have difficulty dressing or bathing?: No Does the patient have difficulty doing errands alone such as visiting a doctor's office or shopping?: No  FALL RISK: Fall Risk  08/25/2018 04/22/2018 02/02/2018 05/27/2017 04/14/2017  Falls in the past year? 0 0 0 No No  Number falls in past yr: 0 - - - -  Follow up Falls evaluation completed - Falls evaluation completed - -   Advanced Directives <no information>  Past Medical History:  Past Medical History:  Diagnosis Date  . Anemia   . BPH (benign prostatic hypertrophy)   . Chronic GI bleeding   .  COPD (chronic obstructive pulmonary disease) ([place])   . Hyperlipidemia   . Hypertension   . Prostate nodule   . PUD (peptic ulcer disease)   . Rising PSA level     Surgical History:  Past Surgical History:  Procedure Laterality Date  . COLONOSCOPY  2015  . ESOPHAGOGASTRODUODENOSCOPY  2015    Medications:  Current Outpatient Medications on File Prior to Visit  Medication Sig  . amLODipine (NORVASC) 10 MG tablet Take 1 tablet (10 mg total)  by mouth daily.  . Calcium Carbonate-Vitamin D3 (CALCIUM 600+D3) 600-400 MG-UNIT TABS Take 1 tablet by mouth daily.  . carvedilol (COREG) 25 MG tablet Take 1 tablet (25 mg total) by mouth 2 (two) times daily with a meal.  . Ferrous Sulfate (IRON) 325 (65 FE) MG TABS Take 1 tablet by mouth daily. (Patient taking differently: Take 2 tablets by mouth daily. )  . hydrochlorothiazide (HYDRODIURIL) 25 MG tablet Take 1 tablet (25 mg total) by mouth daily.  Marland Kitchen LORazepam (ATIVAN) 1 MG tablet TAKE ONE TABLET DAILY IF NEEDED FOR ANXIETY.  Marland Kitchen losartan (COZAAR) 100 MG tablet Take 1 tablet (100 mg total) by mouth daily.  . meloxicam (MOBIC) 15 MG tablet Take 1 tablet (15 mg total) by mouth daily.  . Multiple Vitamins-Minerals (MENS MULTIVITAMIN PLUS PO) Take by mouth daily.  . Omega-3 Fatty Acids (FISH OIL) 1000 MG CAPS Take by mouth.  . pantoprazole (PROTONIX) 40 MG tablet Take 1 tablet (40 mg total) by mouth daily.  . tamsulosin (FLOMAX) 0.4 MG CAPS capsule Take 1 capsule (0.4 mg total) by mouth daily.   No current facility-administered medications on file prior to visit.     Allergies:  No Known Allergies  Social History:  Social History   Socioeconomic History  . Marital status: Married    Spouse name: Not on file  . Number of children: Not on file  . Years of education: Not on file  . Highest education level: Associate degree: academic program  Occupational History  . Not on file  Social Needs  . Financial resource strain: Not hard at all  . Food insecurity    Worry: Never true    Inability: Never true  . Transportation needs    Medical: No    Non-medical: No  Tobacco Use  . Smoking status: Former Smoker    Packs/day: 1.00    Years: 30.00    Pack years: 30.00    Types: Cigarettes    Quit date: 08/17/1986    Years since quitting: 32.0  . Smokeless tobacco: Never Used  Substance and Sexual Activity  . Alcohol use: Yes    Alcohol/week: 0.0 standard drinks    Comment: occasional  .  Drug use: No  . Sexual activity: Not on file  Lifestyle  . Physical activity    Days per week: 0 days    Minutes per session: 0 min  . Stress: Not at all  Relationships  . Social connections    Talks on phone: More than three times a week    Gets together: More than three times a week    Attends religious service: More than 4 times per year    Active member of club or organization: No    Attends meetings of clubs or organizations: Never    Relationship status: Married  . Intimate partner violence    Fear of current or ex partner: No    Emotionally abused: No    Physically abused: No  Forced sexual activity: No  Other Topics Concern  . Not on file  Social History Narrative   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    Involved in [place] activities    Social History   Tobacco Use  Smoking Status Former Smoker  . Packs/day: 1.00  . Years: 30.00  . Pack years: 30.00  . Types: Cigarettes  . Quit date: 08/17/1986  . Years since quitting: 32.0  Smokeless Tobacco Never Used   Social History   Substance and Sexual Activity  Alcohol Use Yes  . Alcohol/week: 0.0 standard drinks   Comment: occasional    Family History:  Family History  Problem Relation Age of Onset  . Cancer Mother        breast  . Diabetes Mother   . Aneurysm Father   . Kidney disease Neg Hx   . Prostate cancer Neg Hx   . Kidney cancer Neg Hx     Past medical history, surgical history, medications, allergies, family history and social history reviewed with patient today and changes made to appropriate areas of the chart.   Review of Systems - negative All other ROS negative except what is listed above and in the HPI.      Objective:    BP 138/80   Pulse 63   Temp 97.6 F (36.4 C) (Oral)   Ht 5' 8.31" (1.735 m)   Wt 223 lb (101.2 kg)   SpO2 97%   BMI 33.60 kg/m   Wt Readings from Last 3 Encounters:  08/25/18 223 lb (101.2 kg)  08/06/18 216 lb (98 kg)  05/06/18 220 lb (99.8 kg)     Physical Exam Vitals signs and nursing note reviewed.  Constitutional:      General: He is awake. He is not in acute distress.    Appearance: He is well-developed. He is not ill-appearing.  HENT:     Head: Normocephalic and atraumatic.     Right Ear: Hearing, tympanic membrane, ear canal and external ear normal. No drainage.     Left Ear: Hearing, tympanic membrane, ear canal and external ear normal. No drainage.     Nose: Nose normal.     Mouth/Throat:     Pharynx: Oropharynx is clear. Uvula midline.  Eyes:     General: Lids are normal.        Right eye: No discharge.        Left eye: No discharge.     Extraocular Movements: Extraocular movements intact.     Conjunctiva/sclera: Conjunctivae normal.     Pupils: Pupils are equal, round, and reactive to light.     Visual Fields: Right eye visual fields normal and left eye visual fields normal.  Neck:     Musculoskeletal: Normal range of motion and neck supple.     Thyroid: No thyromegaly.     Vascular: No carotid bruit or JVD.     Trachea: Trachea normal.  Cardiovascular:     Rate and Rhythm: Normal rate and regular rhythm.     Heart sounds: Normal heart sounds, S1 normal and S2 normal. No murmur. No gallop.   Pulmonary:     Effort: Pulmonary effort is normal. No accessory muscle usage or respiratory distress.     Breath sounds: Normal breath sounds.  Abdominal:     General: Bowel sounds are normal.     Palpations: Abdomen is soft. There is no hepatomegaly or splenomegaly.     Tenderness: There is no abdominal tenderness.  Genitourinary:  Comments: Deferred, goes to [facility] this week and having performed per his report. Musculoskeletal: Normal range of motion.     Right lower leg: Edema (trace) present.     Left lower leg: Edema (trace) present.  Lymphadenopathy:     Cervical: No cervical adenopathy.  Skin:    General: Skin is warm and dry.     Capillary Refill: Capillary refill takes less than 2 seconds.     Findings:  No rash.  Neurological:     Mental Status: He is alert and oriented to person, place, and time.     Cranial Nerves: Cranial nerves are intact.     Coordination: Coordination is intact.     Gait: Gait is intact.     Deep Tendon Reflexes: Reflexes are normal and symmetric.     Reflex Scores:      Brachioradialis reflexes are 2+ on the right side and 2+ on the left side.      Patellar reflexes are 2+ on the right side and 2+ on the left side. Psychiatric:        Attention and Perception: Attention normal.        Mood and Affect: Mood normal.        Speech: Speech normal.        Behavior: Behavior normal. Behavior is cooperative.        Thought Content: Thought content normal.        Cognition and Memory: Cognition normal.        Judgment: Judgment normal.     Results for orders placed or performed in visit on 05/06/18  PSA  Result Value Ref Range   Prostate Specific Ag, Serum <0.1 0.0 - 4.0 ng/mL  Bladder Scan (Post Void Residual) in office  Result Value Ref Range   Scan Result 0       Assessment & Plan:   Problem List Items Addressed This Visit      Cardiovascular and Mediastinum   Hypertension    Chronic, stable.  At goal at home and in office.  Continue current medication regimen.  Labs today.        Digestive   Chronic GI bleeding     Genitourinary   Prostate cancer [facility])    Continue collaboration with [facility] and urology.        Other   Hyperlipidemia    Chronic, ongoing.  Continue current medication regimen, fish oil.  Labs today.      Acute anxiety    Chronic, ongoing with minimal use of Ativa (30 tablets lasts one year).  Continue current medication regimen and monitor use.  If increased use consider switch to SSRI.       Other Visit Diagnoses    Annual physical exam    -  Primary   Malignant neoplasm of prostate ([place])           Discussed aspirin prophylaxis for myocardial infarction prevention and decision was that we recommended ASA, and  patient refused  LABORATORY TESTING:  Health maintenance labs ordered today as discussed above.   The natural history of prostate cancer and ongoing controversy regarding screening and potential treatment outcomes of prostate cancer has been discussed with the patient. The meaning of a false positive PSA and a false negative PSA has been discussed. He indicates understanding of the limitations of this screening test and wishes not to proceed with screening PSA testing, as having performed regularly at [facility] center.   IMMUNIZATIONS:   -  Tdap: Tetanus vaccination status reviewed: last tetanus booster within 10 years. - Influenza: Up to date - Pneumovax: Up to date - Prevnar: Up to date - Zostavax vaccine: Up to date  SCREENING: - Colonoscopy: Up to date  Discussed with patient purpose of the colonoscopy is to detect colon cancer at curable precancerous or early stages   - AAA Screening: Not applicable  -Hearing Test: Not applicable  -Spirometry: Not applicable   PATIENT COUNSELING:    Sexuality: Discussed sexually transmitted diseases, partner selection, use of condoms, avoidance of unintended pregnancy  and contraceptive alternatives.   Advised to avoid cigarette smoking.  I discussed with the patient that most people either abstain from alcohol or drink within safe limits (<=14/week and <=4 drinks/occasion for males, <=7/weeks and <= 3 drinks/occasion for females) and that the risk for alcohol disorders and other health effects rises proportionally with the number of drinks per week and how often a drinker exceeds daily limits.  Discussed cessation/primary prevention of drug use and availability of treatment for abuse.   Diet: Encouraged to adjust caloric intake to maintain  or achieve ideal body weight, to reduce intake of dietary saturated fat and total fat, to limit sodium intake by avoiding high sodium foods and not adding table salt, and to maintain adequate dietary potassium and  calcium preferably from fresh fruits, vegetables, and low-fat dairy products.    stressed the importance of regular exercise  Injury prevention: Discussed safety belts, safety helmets, smoke detector, smoking near bedding or upholstery.   Dental health: Discussed importance of regular tooth brushing, flossing, and dental visits.   Follow up plan: NEXT PREVENTATIVE PHYSICAL DUE IN 1 YEAR. Return in about 6 months (around 02/25/2019) for HTN/HLD, Anxiety.

## 2018-08-26 ENCOUNTER — Other Ambulatory Visit: Payer: Self-pay

## 2018-08-26 LAB — TSH: TSH: 3.11 u[IU]/mL (ref 0.450–4.500)

## 2018-08-26 LAB — CBC WITH DIFFERENTIAL/PLATELET
Basophils Absolute: 0 10*3/uL (ref 0.0–0.2)
Basos: 1 %
EOS (ABSOLUTE): 0.4 10*3/uL (ref 0.0–0.4)
Eos: 8 %
Hematocrit: 42.8 % (ref 37.5–51.0)
Hemoglobin: 14.3 g/dL (ref 13.0–17.7)
Immature Grans (Abs): 0 10*3/uL (ref 0.0–0.1)
Immature Granulocytes: 0 %
Lymphocytes Absolute: 0.5 10*3/uL — ABNORMAL LOW (ref 0.7–3.1)
Lymphs: 10 %
MCH: 32.3 pg (ref 26.6–33.0)
MCHC: 33.4 g/dL (ref 31.5–35.7)
MCV: 97 fL (ref 79–97)
Monocytes Absolute: 0.8 10*3/uL (ref 0.1–0.9)
Monocytes: 17 %
Neutrophils Absolute: 3 10*3/uL (ref 1.4–7.0)
Neutrophils: 64 %
Platelets: 181 10*3/uL (ref 150–450)
RBC: 4.43 x10E6/uL (ref 4.14–5.80)
RDW: 12.9 % (ref 11.6–15.4)
WBC: 4.6 10*3/uL (ref 3.4–10.8)

## 2018-08-26 LAB — COMPREHENSIVE METABOLIC PANEL
ALT: 29 IU/L (ref 0–44)
AST: 22 IU/L (ref 0–40)
Albumin/Globulin Ratio: 2 (ref 1.2–2.2)
Albumin: 4.4 g/dL (ref 3.7–4.7)
Alkaline Phosphatase: 79 IU/L (ref 39–117)
BUN/Creatinine Ratio: 15 (ref 10–24)
BUN: 15 mg/dL (ref 8–27)
Bilirubin Total: 0.3 mg/dL (ref 0.0–1.2)
CO2: 24 mmol/L (ref 20–29)
Calcium: 9.6 mg/dL (ref 8.6–10.2)
Chloride: 100 mmol/L (ref 96–106)
Creatinine, Ser: 0.97 mg/dL (ref 0.76–1.27)
GFR calc Af Amer: 89 mL/min/{1.73_m2} (ref >59)
GFR calc non Af Amer: 77 mL/min/{1.73_m2} (ref >59)
Globulin, Total: 2.2 g/dL (ref 1.5–4.5)
Glucose: 100 mg/dL — ABNORMAL HIGH (ref 65–99)
Potassium: 5 mmol/L (ref 3.5–5.2)
Sodium: 141 mmol/L (ref 134–144)
Total Protein: 6.6 g/dL (ref 6.0–8.5)

## 2018-08-26 LAB — LIPID PANEL
Chol/HDL Ratio: 3.6 ratio (ref 0.0–5.0)
Cholesterol, Total: 191 mg/dL (ref 100–199)
HDL: 53 mg/dL (ref >39)
LDL Calculated: 110 mg/dL — ABNORMAL HIGH (ref 0–99)
Triglycerides: 142 mg/dL (ref 0–149)
VLDL Cholesterol Cal: 28 mg/dL (ref 5–40)

## 2018-08-27 ENCOUNTER — Other Ambulatory Visit: Payer: Self-pay

## 2018-08-27 ENCOUNTER — Inpatient Hospital Stay: Payer: Medicare Other | Attending: Radiation Oncology

## 2018-08-27 DIAGNOSIS — C775 Secondary and unspecified malignant neoplasm of intrapelvic lymph nodes: Secondary | ICD-10-CM | POA: Insufficient documentation

## 2018-08-27 DIAGNOSIS — Z79899 Other long term (current) drug therapy: Secondary | ICD-10-CM | POA: Insufficient documentation

## 2018-08-27 DIAGNOSIS — C61 Malignant neoplasm of prostate: Secondary | ICD-10-CM | POA: Insufficient documentation

## 2018-08-27 LAB — PSA: Prostatic Specific Antigen: <0.01 ng/mL (ref 0.00–4.00)

## 2018-08-31 ENCOUNTER — Encounter: Payer: Self-pay | Admitting: Family Medicine

## 2018-09-02 ENCOUNTER — Other Ambulatory Visit: Payer: Self-pay

## 2018-09-02 DIAGNOSIS — M65331 Trigger finger, right middle finger: Secondary | ICD-10-CM | POA: Diagnosis not present

## 2018-09-02 DIAGNOSIS — M720 Palmar fascial fibromatosis [Dupuytren]: Secondary | ICD-10-CM | POA: Diagnosis not present

## 2018-09-02 DIAGNOSIS — M65341 Trigger finger, right ring finger: Secondary | ICD-10-CM | POA: Diagnosis not present

## 2018-09-02 DIAGNOSIS — M79644 Pain in right finger(s): Secondary | ICD-10-CM | POA: Diagnosis not present

## 2018-09-03 ENCOUNTER — Encounter: Payer: Self-pay | Admitting: Radiation Oncology

## 2018-09-03 ENCOUNTER — Ambulatory Visit
Admission: RE | Admit: 2018-09-03 | Discharge: 2018-09-03 | Disposition: A | Discharge status: Home / Self Care | Payer: Medicare Other | Source: Ambulatory Visit | Attending: Radiation Oncology | Admitting: Radiation Oncology

## 2018-09-03 ENCOUNTER — Other Ambulatory Visit: Payer: Self-pay

## 2018-09-03 VITALS — BP 126/66 | HR 73 | Temp 98.3°F | Resp 18 | Wt 220.2 lb

## 2018-09-03 DIAGNOSIS — Z923 Personal history of irradiation: Secondary | ICD-10-CM | POA: Insufficient documentation

## 2018-09-03 DIAGNOSIS — C61 Malignant neoplasm of prostate: Secondary | ICD-10-CM | POA: Insufficient documentation

## 2018-09-03 NOTE — Progress Notes (Signed)
Radiation Oncology Follow up Note  Name: Jonathan Burns   Date:   09/03/2018 MRN:  [id number] DOB: 18-Dec-1945    This 73 y.o. male presents to the clinic today for 1 year follow-up status post IMRT radiation therapy for stage IIb Gleason 8 adenocarcinoma.Marland Kitchen  REFERRING PROVIDER: Guadalupe Maple, MD  HPI: Patient is a 73 year old male now about 1 year having completed IMRT radiation therapy to his prostate and pelvic nodes for a stage IIb Gleason 8 (4+4) adenocarcinoma the prostate.  Seen today in routine follow-up he is doing well.  He specifically denies any increased lower urinary tract symptoms diarrhea or fatigue.  He is currently under androgen deprivation therapy through urology.  His most recent PSA is less than 1.82.  COMPLICATIONS OF TREATMENT: none  FOLLOW UP COMPLIANCE: keeps appointments   PHYSICAL EXAM:  BP 126/66   Pulse 73   Temp 98.3 F (36.8 C)   Resp 18   Wt 220 lb 3.8 oz (99.9 kg)   BMI 33.19 kg/m  Well-developed well-nourished patient in NAD. HEENT reveals PERLA, EOMI, discs not visualized.  Oral cavity is clear. No oral mucosal lesions are identified. Neck is clear without evidence of cervical or supraclavicular adenopathy. Lungs are clear to A&P. Cardiac examination is essentially unremarkable with regular rate and rhythm without murmur rub or thrill. Abdomen is benign with no organomegaly or masses noted. Motor sensory and DTR levels are equal and symmetric in the upper and lower extremities. Cranial nerves II through XII are grossly intact. Proprioception is intact. No peripheral adenopathy or edema is identified. No motor or sensory levels are noted. Crude visual fields are within normal range.  RADIOLOGY RESULTS: No current films for review  PLAN: Present time patient is doing well under excellent biochemical control of his prostate cancer he continues androgen deprivation therapy through urology they will discontinue when they feel appropriate.  I am otherwise  pleased with his overall progress.  I have asked to see him back in 1 year for follow-up.  Patient knows to call sooner with any concerns.  I would like to take this opportunity to thank you for allowing me to participate in the care of your patient.[name], MD

## 2018-09-30 DIAGNOSIS — M65331 Trigger finger, right middle finger: Secondary | ICD-10-CM | POA: Diagnosis not present

## 2018-09-30 DIAGNOSIS — M65341 Trigger finger, right ring finger: Secondary | ICD-10-CM | POA: Diagnosis not present

## 2018-09-30 DIAGNOSIS — M79644 Pain in right finger(s): Secondary | ICD-10-CM | POA: Diagnosis not present

## 2018-09-30 DIAGNOSIS — M720 Palmar fascial fibromatosis [Dupuytren]: Secondary | ICD-10-CM | POA: Diagnosis not present

## 2018-10-20 DIAGNOSIS — Z23 Encounter for immunization: Secondary | ICD-10-CM | POA: Diagnosis not present

## 2018-11-02 ENCOUNTER — Other Ambulatory Visit: Payer: Self-pay

## 2018-11-02 DIAGNOSIS — C61 Malignant neoplasm of prostate: Secondary | ICD-10-CM

## 2018-11-03 ENCOUNTER — Other Ambulatory Visit: Payer: Medicare Other

## 2018-11-03 ENCOUNTER — Other Ambulatory Visit: Payer: Self-pay

## 2018-11-03 DIAGNOSIS — C61 Malignant neoplasm of prostate: Secondary | ICD-10-CM

## 2018-11-04 LAB — PSA: Prostate Specific Ag, Serum: <0.1 ng/mL (ref 0.0–4.0)

## 2018-11-04 NOTE — Progress Notes (Signed)
05/06/2018 9:29 AM   Jonathan Burns Jonathan Burns 11/11/1945 [id number]  Referring provider: Guadalupe Maple, MD [address] [place],  [place] [address]  Chief Complaint  Patient presents with  . Prostate Cancer    HPI: Jonathan Burns is a 73 y.o. male with prostate cancer, BPH with LUTS and an incidental finding of AMH who presents today for 12 month follow-up.  Prostate cancer Patient was originally referred to [place] for a possible prostate nodule found by his primary care physician.  It had not been appreciated on subsequent exams until 04/10/2017.  He does not have a family history of prostate cancer.  On 04/15/2017, patient underwent a biopsy for an elevated PSA of 6.0, and a right-sided prostate nodule. TRUS volume 33 cc.  Prostate biopsy revealed 12 of 12 cores positive including lesions up to Gleason 4+4 involving 80% of the tissue on the right lateral mid core.  Patient completed 07/2017 IM RT radiation therapy to both his prostate and pelvic nodes for stage IIB Gleason 8 adenocarcinoma for the prostate.  He is currently on ADT therapy.  His most recent PSA was < 0.1 ng/mL on 11/03/2018  BPH WITH LUTS  (prostate and/or bladder) IPSS score: 4/1      Previous score: 7/1  Previous PVR: 0 mL   Major complaint(s):  Nocturia.   Patient denies any gross hematuria, dysuria or suprapubic/flank pain.  Patient denies any fevers, chills, nausea or vomiting.                 Currently taking: Tamsulosin.  He does not have a family history of PCa, but does have a personal history of PCa.  IPSS    Row Name 11/05/18 0800         International Prostate Symptom Score   How often have you had the sensation of not emptying your bladder?  Not at All     How often have you had to urinate less than every two hours?  Less than 1 in 5 times     How often have you found you stopped and started again several times when you urinated?  Not at All     How often have you found it difficult to postpone urination?   Less than 1 in 5 times     How often have you had a weak urinary stream?  Not at All     How often have you had to strain to start urination?  Not at All     How many times did you typically get up at night to urinate?  2 Times     Total IPSS Score  4       Quality of Life due to urinary symptoms   If you were to spend the rest of your life with your urinary condition just the way it is now how would you feel about that?  Pleased       Score:  1-7 Mild 8-19 Moderate 20-35 Severe  History of hematuria Patient was found to have 3-10 RBC's on a routine physical exam on 03/25/2016 and advised of the risk of malignancies.  Subsequent UA's did not demonstrate AMH and patient did not want to pursue hematuria workup.  He is a former smoker, with a 1 ppd history.  Quit 30 years ago.  He is not exposed to secondhand smoke.  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  No reports of gross hematuria.   PMH: Past Medical History:  Diagnosis Date  . Anemia   . BPH (benign prostatic hypertrophy)   . Chronic GI bleeding   . COPD (chronic obstructive pulmonary disease) ([place])   . Hyperlipidemia   . Hypertension   . Prostate nodule   . PUD (peptic ulcer disease)   . Rising PSA level     Surgical History: Past Surgical History:  Procedure Laterality Date  . COLONOSCOPY  2015  . ESOPHAGOGASTRODUODENOSCOPY  2015    Home Medications:  Allergies as of 11/05/2018   No Known Allergies     Medication List       Accurate as of November 05, 2018  9:29 AM. If you have any questions, ask your nurse or doctor.        amLODipine 10 MG tablet Commonly known as: NORVASC Take 1 tablet (10 mg total) by mouth daily.   Calcium 600+D3 600-400 MG-UNIT Tabs Generic drug: Calcium Carbonate-Vitamin D3 Take 1 tablet by mouth daily.   carvedilol 25 MG tablet Commonly known as: COREG Take 1 tablet (25 mg total) by mouth 2 (two) times daily with a meal.   Fish Oil 1000 MG Caps Take by mouth.    hydrochlorothiazide 25 MG tablet Commonly known as: HYDRODIURIL Take 1 tablet (25 mg total) by mouth daily.   Iron 325 (65 Fe) MG Tabs Take 1 tablet by mouth daily. What changed: how much to take   LORazepam 1 MG tablet Commonly known as: ATIVAN TAKE ONE TABLET DAILY IF NEEDED FOR ANXIETY.   losartan 100 MG tablet Commonly known as: COZAAR Take 1 tablet (100 mg total) by mouth daily.   meloxicam 15 MG tablet Commonly known as: MOBIC Take 1 tablet (15 mg total) by mouth daily.   MENS MULTIVITAMIN PLUS PO Take by mouth daily.   pantoprazole 40 MG tablet Commonly known as: PROTONIX Take 1 tablet (40 mg total) by mouth daily.   tamsulosin 0.4 MG Caps capsule Commonly known as: FLOMAX Take 1 capsule (0.4 mg total) by mouth daily.       Allergies: No Known Allergies  Family History: Family History  Problem Relation Age of Onset  . Cancer Mother        breast  . Diabetes Mother   . Aneurysm Father   . Kidney disease Neg Hx   . Prostate cancer Neg Hx   . Kidney cancer Neg Hx     Social History:  reports that he quit smoking about 32 years ago. His smoking use included cigarettes. He has a 30.00 pack-year smoking history. He has never used smokeless tobacco. He reports current alcohol use. He reports that he does not use drugs.  ROS: UROLOGY Frequent Urination?: No Hard to postpone urination?: No Burning/pain with urination?: No Get up at night to urinate?: Yes Leakage of urine?: No Urine stream starts and stops?: No Trouble starting stream?: No Do you have to strain to urinate?: No Blood in urine?: No Urinary tract infection?: No Sexually transmitted disease?: No Injury to kidneys or bladder?: No Painful intercourse?: No Weak stream?: No Erection problems?: No Penile pain?: No  Gastrointestinal Nausea?: No Vomiting?: No Indigestion/heartburn?: No Diarrhea?: No Constipation?: No  Constitutional Fever: No Night sweats?: No Weight loss?: No  Fatigue?: No  Skin Skin rash/lesions?: No Itching?: No  Eyes Blurred vision?: No Double vision?: No  Ears/Nose/Throat Sore throat?: No Sinus problems?: Yes  Hematologic/Lymphatic Swollen glands?: No Easy bruising?: No  Cardiovascular Leg swelling?: No Chest pain?: No  Respiratory Cough?: No Shortness of breath?: No  Endocrine Excessive thirst?: No  Musculoskeletal Back pain?: No Joint pain?: No  Neurological Headaches?: No Dizziness?: No  Psychologic Depression?: No Anxiety?: No  Physical Exam: BP 138/82 (BP Location: Left Arm, Patient Position: Sitting, Cuff Size: Normal)   Pulse 69   Ht 5' 8.31" (1.735 m)   Wt 225 lb 1.6 oz (102.1 kg)   BMI 33.92 kg/m   Constitutional:  Well nourished. Alert and oriented, No acute distress. HEENT: [place] AT, moist mucus membranes.  Trachea midline, no masses. Cardiovascular: No clubbing, cyanosis, or edema. Respiratory: Normal respiratory effort, no increased work of breathing. Neurologic: Grossly intact, no focal deficits, moving all 4 extremities. Psychiatric: Normal mood and affect.  Laboratory Data:  Lab Results  Component Value Date   WBC 4.6 08/25/2018   HGB 14.3 08/25/2018   HCT 42.8 08/25/2018   MCV 97 08/25/2018   PLT 181 08/25/2018    Lab Results  Component Value Date   CREATININE 0.97 08/25/2018   Lab Results  Component Value Date   TSH 3.110 08/25/2018      Component Value Date/Time   CHOL 191 08/25/2018 0839   CHOL 202 (H) 12/02/2017 0830   HDL 53 08/25/2018 0839   CHOLHDL 3.6 08/25/2018 0839   VLDL 32 (H) 12/02/2017 0830   LDLCALC 110 (H) 08/25/2018 0839   Lab Results  Component Value Date   AST 22 08/25/2018   Lab Results  Component Value Date   ALT 29 08/25/2018   PSA History  0.01 ng/mL on 12/15/2017  6.0 ng/mL on 04/10/2017  3.1 ng/mL on 04/05/2016  3.4 ng/mL on 03/25/2016  2.2 ng/mL on 04/07/2015  2.8 ng/mL on 02/23/2015  2.2 ng/mL on 10/10/2014  3.2 ng/mL on 12/15   2.3 ng/mL on 12/14  2.0 ng/mL on 12/13   2.3 ng/mL on 11/12             1.03 ng/mL on 11/10  <0.1 ng/mL on 11/03/2018 I have reviewed the labs.  Assessment & Plan:    1. Prostate cancer - Status post IR MT radiation therapy to both his prostate and pelvic nodes for stage IIB Gleason 8 adenocarcinoma for the prostate - PSA <0.1   2. BPH with LUTS - IPSS score is 7/2, it is worsening - Continue conservative management, avoiding bladder irritants and timed voiding's - RTC in 4 months for I PSS and PSA   3. History of hematuria - No reports of gross hematuria     Return in about 4 months (around 03/07/2019) for Lupron/Eligard, PSA and office visit .  Zara Council, PA-C  [facility] [address] [place], [place] [address] [phone]

## 2018-11-05 ENCOUNTER — Ambulatory Visit (INDEPENDENT_AMBULATORY_CARE_PROVIDER_SITE_OTHER): Payer: Medicare Other | Admitting: Urology

## 2018-11-05 ENCOUNTER — Other Ambulatory Visit: Payer: Self-pay

## 2018-11-05 ENCOUNTER — Encounter: Payer: Self-pay | Admitting: Urology

## 2018-11-05 VITALS — BP 138/82 | HR 69 | Ht 68.31 in | Wt 225.1 lb

## 2018-11-05 DIAGNOSIS — N138 Other obstructive and reflux uropathy: Secondary | ICD-10-CM

## 2018-11-05 DIAGNOSIS — Z87448 Personal history of other diseases of urinary system: Secondary | ICD-10-CM

## 2018-11-05 DIAGNOSIS — C61 Malignant neoplasm of prostate: Secondary | ICD-10-CM | POA: Diagnosis not present

## 2018-11-05 DIAGNOSIS — N401 Enlarged prostate with lower urinary tract symptoms: Secondary | ICD-10-CM | POA: Diagnosis not present

## 2018-11-05 MED ORDER — LEUPROLIDE ACETATE (4 MONTH) 30 MG IM KIT
30.0000 mg | PACK | Freq: Once | INTRAMUSCULAR | Status: AC
  Administered 2018-11-05: 30 mg via INTRAMUSCULAR

## 2018-11-05 NOTE — Progress Notes (Signed)
Lupron IM Injection   Due to Prostate Cancer patient is present today for a Lupron Injection.  Medication: Lupron 4 month Dose: 30 mg  Location: right upper outer buttocks Lot: [id number] Exp: 10/08/2020  Patient tolerated well, no complications were noted  Performed by: Elberta Leatherwood, CMA  Follow up: 4 month for 6 month Eligard

## 2018-11-16 DIAGNOSIS — L570 Actinic keratosis: Secondary | ICD-10-CM | POA: Diagnosis not present

## 2018-11-16 DIAGNOSIS — Z86018 Personal history of other benign neoplasm: Secondary | ICD-10-CM | POA: Diagnosis not present

## 2018-11-16 DIAGNOSIS — L578 Other skin changes due to chronic exposure to nonionizing radiation: Secondary | ICD-10-CM | POA: Diagnosis not present

## 2018-11-16 DIAGNOSIS — Z872 Personal history of diseases of the skin and subcutaneous tissue: Secondary | ICD-10-CM | POA: Diagnosis not present

## 2018-12-16 DIAGNOSIS — H33312 Horseshoe tear of retina without detachment, left eye: Secondary | ICD-10-CM | POA: Diagnosis not present

## 2018-12-16 DIAGNOSIS — H353131 Nonexudative age-related macular degeneration, bilateral, early dry stage: Secondary | ICD-10-CM | POA: Diagnosis not present

## 2018-12-16 DIAGNOSIS — H43813 Vitreous degeneration, bilateral: Secondary | ICD-10-CM | POA: Diagnosis not present

## 2018-12-16 DIAGNOSIS — B0050 Herpesviral ocular disease, unspecified: Secondary | ICD-10-CM | POA: Diagnosis not present

## 2018-12-18 DIAGNOSIS — H33312 Horseshoe tear of retina without detachment, left eye: Secondary | ICD-10-CM | POA: Diagnosis not present

## 2018-12-18 DIAGNOSIS — B0050 Herpesviral ocular disease, unspecified: Secondary | ICD-10-CM | POA: Diagnosis not present

## 2018-12-18 DIAGNOSIS — H43813 Vitreous degeneration, bilateral: Secondary | ICD-10-CM | POA: Diagnosis not present

## 2018-12-18 DIAGNOSIS — H353131 Nonexudative age-related macular degeneration, bilateral, early dry stage: Secondary | ICD-10-CM | POA: Diagnosis not present

## 2018-12-21 DIAGNOSIS — B0050 Herpesviral ocular disease, unspecified: Secondary | ICD-10-CM | POA: Diagnosis not present

## 2018-12-21 DIAGNOSIS — H353131 Nonexudative age-related macular degeneration, bilateral, early dry stage: Secondary | ICD-10-CM | POA: Diagnosis not present

## 2018-12-21 DIAGNOSIS — H33312 Horseshoe tear of retina without detachment, left eye: Secondary | ICD-10-CM | POA: Diagnosis not present

## 2018-12-21 DIAGNOSIS — H43813 Vitreous degeneration, bilateral: Secondary | ICD-10-CM | POA: Diagnosis not present

## 2018-12-28 ENCOUNTER — Encounter: Payer: Self-pay | Admitting: Family Medicine

## 2018-12-28 DIAGNOSIS — I10 Essential (primary) hypertension: Secondary | ICD-10-CM | POA: Diagnosis not present

## 2018-12-28 DIAGNOSIS — E669 Obesity, unspecified: Secondary | ICD-10-CM | POA: Diagnosis not present

## 2018-12-28 DIAGNOSIS — G4733 Obstructive sleep apnea (adult) (pediatric): Secondary | ICD-10-CM | POA: Diagnosis not present

## 2019-01-06 ENCOUNTER — Other Ambulatory Visit: Payer: Self-pay

## 2019-01-06 ENCOUNTER — Encounter: Payer: Self-pay | Admitting: Family Medicine

## 2019-01-06 ENCOUNTER — Ambulatory Visit (INDEPENDENT_AMBULATORY_CARE_PROVIDER_SITE_OTHER): Payer: Medicare Other | Admitting: Family Medicine

## 2019-01-06 VITALS — BP 100/65 | HR 80 | Temp 97.7°F | Ht 68.3 in | Wt 216.0 lb

## 2019-01-06 DIAGNOSIS — D72829 Elevated white blood cell count, unspecified: Secondary | ICD-10-CM | POA: Diagnosis not present

## 2019-01-06 DIAGNOSIS — Z20828 Contact with and (suspected) exposure to other viral communicable diseases: Secondary | ICD-10-CM | POA: Diagnosis not present

## 2019-01-06 DIAGNOSIS — R5383 Other fatigue: Secondary | ICD-10-CM | POA: Diagnosis not present

## 2019-01-06 DIAGNOSIS — Z20822 Contact with and (suspected) exposure to covid-19: Secondary | ICD-10-CM

## 2019-01-06 LAB — MICROSCOPIC EXAMINATION
Bacteria, UA: NONE SEEN
WBC, UA: NONE SEEN /hpf (ref 0–5)

## 2019-01-06 LAB — CBC WITH DIFFERENTIAL/PLATELET
Hematocrit: 38.4 % (ref 37.5–51.0)
Hemoglobin: 14 g/dL (ref 13.0–17.7)
Lymphocytes Absolute: 0.1 10*3/uL — ABNORMAL LOW (ref 0.7–3.1)
Lymphs: 1 %
MCH: 33.3 pg — ABNORMAL HIGH (ref 26.6–33.0)
MCHC: 36.5 g/dL — ABNORMAL HIGH (ref 31.5–35.7)
MCV: 91 fL (ref 79–97)
MID (Absolute): 2.1 10*3/uL — ABNORMAL HIGH (ref 0.1–1.6)
MID: 10 %
Neutrophils Absolute: 19.1 10*3/uL — ABNORMAL HIGH (ref 1.4–7.0)
Neutrophils: 89 %
Platelets: 578 10*3/uL — ABNORMAL HIGH (ref 150–450)
RBC: 4.21 x10E6/uL (ref 4.14–5.80)
RDW: 13.5 % (ref 11.6–15.4)
WBC: 21.3 10*3/uL (ref 3.4–10.8)

## 2019-01-06 LAB — UA/M W/RFLX CULTURE, ROUTINE
Bilirubin, UA: NEGATIVE
Glucose, UA: NEGATIVE
Ketones, UA: NEGATIVE
Leukocytes,UA: NEGATIVE
Nitrite, UA: NEGATIVE
Specific Gravity, UA: 1.02 (ref 1.005–1.030)
Urobilinogen, Ur: 2 mg/dL — ABNORMAL HIGH (ref 0.2–1.0)
pH, UA: 5 (ref 5.0–7.5)

## 2019-01-06 MED ORDER — AMOXICILLIN-POT CLAVULANATE 875-125 MG PO TABS: 1.0000 | ORAL_TABLET | Freq: Two times a day (BID) | ORAL | 0 refills | Status: DC

## 2019-01-06 MED ORDER — BACLOFEN 10 MG PO TABS: 10.0000 mg | ORAL_TABLET | Freq: Every day | ORAL | 0 refills | Status: DC

## 2019-01-06 NOTE — Progress Notes (Signed)
BP 100/65   Pulse 80   Temp 97.7 F (36.5 C) (Oral)   Ht 5' 8.3" (1.735 m)   Wt 216 lb (98 kg)   BMI 32.55 kg/m    Subjective:    Patient ID: Jonathan Burns, male    DOB: 1945/10/08, 73 y.o.   MRN: [id number]  HPI: Jonathan Burns is a 73 y.o. male  Chief Complaint  Patient presents with  . Fatigue    pt states he has not been feeling good for the last 2-3 days. states no apetite, sleeping all the time   FATIGUE Duration:  2-3 days Severity: moderate  Onset: sudden Context when symptoms started:  unknown Symptoms improve with rest: yes  Depressive symptoms: no Stress/anxiety: yes Insomnia: no  Snoring: no Observed apnea by bed partner: on CPAP Daytime hypersomnolence:no Wakes feeling refreshed: yes History of sleep study: yes Dysnea on exertion:  yes Orthopnea/PND: no Chest pain: no Chronic cough: no Lower extremity edema: no Arthralgias:no Myalgias: no Weakness: yes Rash: no  Relevant past medical, surgical, family and social history reviewed and updated as indicated. Interim medical history since our last visit reviewed. Allergies and medications reviewed and updated.  Review of Systems  Constitutional: Positive for fatigue. Negative for activity change, appetite change, chills, diaphoresis, fever and unexpected weight change.  Eyes: Negative.   Respiratory: Negative.   Cardiovascular: Negative.   Gastrointestinal: Negative.   Musculoskeletal: Positive for myalgias. Negative for arthralgias, back pain, gait problem, joint swelling, neck pain and neck stiffness.  Skin: Negative.   Neurological: Positive for weakness. Negative for dizziness, tremors, seizures, syncope, facial asymmetry, speech difficulty, light-headedness, numbness and headaches.  Hematological: Negative.   Psychiatric/Behavioral: Negative.     Per HPI unless specifically indicated above     Objective:    BP 100/65   Pulse 80   Temp 97.7 F (36.5 C) (Oral)   Ht 5' 8.3" (1.735 m)    Wt 216 lb (98 kg)   BMI 32.55 kg/m   Wt Readings from Last 3 Encounters:  01/06/19 216 lb (98 kg)  11/05/18 225 lb 1.6 oz (102.1 kg)  09/03/18 220 lb 3.8 oz (99.9 kg)    Physical Exam Vitals signs and nursing note reviewed.  Constitutional:      General: He is not in acute distress.    Appearance: Normal appearance. He is not ill-appearing, toxic-appearing or diaphoretic.  HENT:     Head: Normocephalic and atraumatic.     Right Ear: External ear normal.     Left Ear: External ear normal.     Nose: Nose normal.     Mouth/Throat:     Mouth: Mucous membranes are moist.     Pharynx: Oropharynx is clear.  Eyes:     General: No scleral icterus.       Right eye: No discharge.        Left eye: No discharge.     Extraocular Movements: Extraocular movements intact.     Conjunctiva/sclera: Conjunctivae normal.     Pupils: Pupils are equal, round, and reactive to light.  Neck:     Musculoskeletal: Normal range of motion and neck supple.  Cardiovascular:     Rate and Rhythm: Normal rate and regular rhythm.     Pulses: Normal pulses.     Heart sounds: Normal heart sounds. No murmur. No friction rub. No gallop.   Pulmonary:     Effort: Pulmonary effort is normal. No respiratory distress.     Breath  sounds: Normal breath sounds. No stridor. No wheezing, rhonchi or rales.  Chest:     Chest wall: No tenderness.  Musculoskeletal: Normal range of motion.  Skin:    General: Skin is warm and dry.     Capillary Refill: Capillary refill takes less than 2 seconds.     Coloration: Skin is not jaundiced or pale.     Findings: No bruising, erythema, lesion or rash.  Neurological:     General: No focal deficit present.     Mental Status: He is alert and oriented to person, place, and time. Mental status is at baseline.  Psychiatric:        Mood and Affect: Mood normal.        Behavior: Behavior normal.        Thought Content: Thought content normal.        Judgment: Judgment normal.      Results for orders placed or performed in visit on 01/06/19  Microscopic Examination   URINE  Result Value Ref Range   WBC, UA None seen 0 - 5 /hpf   RBC 0-2 0 - 2 /hpf   Epithelial Cells (non renal) 0-10 0 - 10 /hpf   Casts Present None seen /lpf   Cast Type Granular casts (A) N/A   Mucus, UA Present Not Estab.   Bacteria, UA None seen None seen/Few  Comp Met (CMET)  Result Value Ref Range   Glucose 145 (H) 65 - 99 mg/dL   BUN 31 (H) 8 - 27 mg/dL   Creatinine, Ser 1.38 (H) 0.76 - 1.27 mg/dL   GFR calc non Af Amer 50 (L) >59 mL/min/1.73   GFR calc Af Amer 58 (L) >59 mL/min/1.73   BUN/Creatinine Ratio 22 10 - 24   Sodium 131 (L) 134 - 144 mmol/L   Potassium 3.6 3.5 - 5.2 mmol/L   Chloride 90 (L) 96 - 106 mmol/L   CO2 22 20 - 29 mmol/L   Calcium 8.3 (L) 8.6 - 10.2 mg/dL   Total Protein 6.3 6.0 - 8.5 g/dL   Albumin 2.9 (L) 3.7 - 4.7 g/dL   Globulin, Total 3.4 1.5 - 4.5 g/dL   Albumin/Globulin Ratio 0.9 (L) 1.2 - 2.2   Bilirubin Total 0.5 0.0 - 1.2 mg/dL   Alkaline Phosphatase 156 (H) 39 - 117 IU/L   AST 17 0 - 40 IU/L   ALT 31 0 - 44 IU/L  UA/M w/rflx Culture, Routine   Specimen: Urine   URINE  Result Value Ref Range   Specific Gravity, UA 1.020 1.005 - 1.030   pH, UA 5.0 5.0 - 7.5   Color, UA Yellow Yellow   Appearance Ur Clear Clear   Leukocytes,UA Negative Negative   Protein,UA Trace (A) Negative/Trace   Glucose, UA Negative Negative   Ketones, UA Negative Negative   RBC, UA Trace (A) Negative   Bilirubin, UA Negative Negative   Urobilinogen, Ur 2.0 (H) 0.2 - 1.0 mg/dL   Nitrite, UA Negative Negative   Microscopic Examination See below:   CBC With Differential/Platelet  Result Value Ref Range   WBC 21.3 (HH) 3.4 - 10.8 x10E3/uL   RBC 4.21 4.14 - 5.80 x10E6/uL   Hemoglobin 14.0 13.0 - 17.7 g/dL   Hematocrit 38.4 37.5 - 51.0 %   MCV 91 79 - 97 fL   MCH 33.3 (H) 26.6 - 33.0 pg   MCHC 36.5 (H) 31.5 - 35.7 g/dL   RDW 13.5 11.6 - 15.4 %   Platelets  578 (H) 150 -  450 x10E3/uL   Neutrophils 89 Not Estab. %   Lymphs 1 Not Estab. %   MID 10 Not Estab. %   Neutrophils Absolute 19.1 (H) 1.4 - 7.0 x10E3/uL   Lymphocytes Absolute 0.1 (L) 0.7 - 3.1 x10E3/uL   MID (Absolute) 2.1 (H) 0.1 - 1.6 X10E3/uL      Assessment & Plan:   Problem List Items Addressed This Visit      Other   Fatigue - Primary    Of unclear etiology- given leukocytosis will treat with antibiotics and await labs. Will check COVID test as well. Await results. Call with any concerns.      Relevant Orders   Comp Met (CMET) (Completed)   UA/M w/rflx Culture, Routine (Completed)   CBC With Differential/Platelet (Completed)    Other Visit Diagnoses    Leukocytosis, unspecified type       Await labs and will treat with augmentin. Await results. Close follow up.       Follow up plan: Return in about 2 weeks (around 01/20/2019) for follow up fatigue.

## 2019-01-07 LAB — COMPREHENSIVE METABOLIC PANEL
ALT: 31 IU/L (ref 0–44)
AST: 17 IU/L (ref 0–40)
Albumin/Globulin Ratio: 0.9 — ABNORMAL LOW (ref 1.2–2.2)
Albumin: 2.9 g/dL — ABNORMAL LOW (ref 3.7–4.7)
Alkaline Phosphatase: 156 IU/L — ABNORMAL HIGH (ref 39–117)
BUN/Creatinine Ratio: 22 (ref 10–24)
BUN: 31 mg/dL — ABNORMAL HIGH (ref 8–27)
Bilirubin Total: 0.5 mg/dL (ref 0.0–1.2)
CO2: 22 mmol/L (ref 20–29)
Calcium: 8.3 mg/dL — ABNORMAL LOW (ref 8.6–10.2)
Chloride: 90 mmol/L — ABNORMAL LOW (ref 96–106)
Creatinine, Ser: 1.38 mg/dL — ABNORMAL HIGH (ref 0.76–1.27)
GFR calc Af Amer: 58 mL/min/{1.73_m2} — ABNORMAL LOW (ref >59)
GFR calc non Af Amer: 50 mL/min/{1.73_m2} — ABNORMAL LOW (ref >59)
Globulin, Total: 3.4 g/dL (ref 1.5–4.5)
Glucose: 145 mg/dL — ABNORMAL HIGH (ref 65–99)
Potassium: 3.6 mmol/L (ref 3.5–5.2)
Sodium: 131 mmol/L — ABNORMAL LOW (ref 134–144)
Total Protein: 6.3 g/dL (ref 6.0–8.5)

## 2019-01-08 LAB — NOVEL CORONAVIRUS, NAA: SARS-CoV-2, NAA: NOT DETECTED

## 2019-01-09 NOTE — Assessment & Plan Note (Signed)
Of unclear etiology- given leukocytosis will treat with antibiotics and await labs. Will check COVID test as well. Await results. Call with any concerns.

## 2019-01-10 ENCOUNTER — Telehealth: Payer: Self-pay | Admitting: Family Medicine

## 2019-01-10 DIAGNOSIS — D72829 Elevated white blood cell count, unspecified: Secondary | ICD-10-CM

## 2019-01-10 NOTE — Telephone Encounter (Signed)
Can we please let him know that his kidney function is up a little bit- check on on how he's feeling and have him come in for repeat labs in the next couple of days? Thanks. Order in.

## 2019-01-11 NOTE — Telephone Encounter (Signed)
Called patient. He is feeling ok, just no energy to do anything per patient. Lab appointment scheduled for in the morning.

## 2019-01-12 ENCOUNTER — Other Ambulatory Visit: Payer: Medicare Other

## 2019-01-12 ENCOUNTER — Other Ambulatory Visit: Payer: Self-pay

## 2019-01-12 DIAGNOSIS — D72829 Elevated white blood cell count, unspecified: Secondary | ICD-10-CM

## 2019-01-13 ENCOUNTER — Inpatient Hospital Stay
Admission: RE | Admit: 2019-01-13 | Discharge: 2019-01-13 | Disposition: A | Discharge status: Home / Self Care | Payer: Medicare Other | Source: Ambulatory Visit | Attending: Family Medicine | Admitting: Family Medicine

## 2019-01-13 ENCOUNTER — Telehealth: Payer: Self-pay | Admitting: Family Medicine

## 2019-01-13 ENCOUNTER — Inpatient Hospital Stay
Admission: EM | Admit: 2019-01-13 | Discharge: 2019-01-28 | DRG: 164 | Disposition: A | Discharge status: Home Health | Payer: Medicare Other | Attending: Cardiothoracic Surgery | Admitting: Cardiothoracic Surgery

## 2019-01-13 ENCOUNTER — Encounter: Payer: Self-pay | Admitting: Emergency Medicine

## 2019-01-13 ENCOUNTER — Other Ambulatory Visit: Payer: Self-pay

## 2019-01-13 DIAGNOSIS — Z09 Encounter for follow-up examination after completed treatment for conditions other than malignant neoplasm: Secondary | ICD-10-CM

## 2019-01-13 DIAGNOSIS — K219 Gastro-esophageal reflux disease without esophagitis: Secondary | ICD-10-CM | POA: Diagnosis not present

## 2019-01-13 DIAGNOSIS — D638 Anemia in other chronic diseases classified elsewhere: Secondary | ICD-10-CM | POA: Diagnosis not present

## 2019-01-13 DIAGNOSIS — E785 Hyperlipidemia, unspecified: Secondary | ICD-10-CM | POA: Diagnosis present

## 2019-01-13 DIAGNOSIS — K279 Peptic ulcer, site unspecified, unspecified as acute or chronic, without hemorrhage or perforation: Secondary | ICD-10-CM

## 2019-01-13 DIAGNOSIS — E876 Hypokalemia: Secondary | ICD-10-CM | POA: Diagnosis not present

## 2019-01-13 DIAGNOSIS — R1084 Generalized abdominal pain: Secondary | ICD-10-CM | POA: Insufficient documentation

## 2019-01-13 DIAGNOSIS — C61 Malignant neoplasm of prostate: Secondary | ICD-10-CM | POA: Diagnosis not present

## 2019-01-13 DIAGNOSIS — Z923 Personal history of irradiation: Secondary | ICD-10-CM

## 2019-01-13 DIAGNOSIS — J9811 Atelectasis: Secondary | ICD-10-CM | POA: Diagnosis not present

## 2019-01-13 DIAGNOSIS — Z20828 Contact with and (suspected) exposure to other viral communicable diseases: Secondary | ICD-10-CM | POA: Diagnosis not present

## 2019-01-13 DIAGNOSIS — J869 Pyothorax without fistula: Principal | ICD-10-CM

## 2019-01-13 DIAGNOSIS — Z6831 Body mass index (BMI) 31.0-31.9, adult: Secondary | ICD-10-CM | POA: Diagnosis not present

## 2019-01-13 DIAGNOSIS — Z9689 Presence of other specified functional implants: Secondary | ICD-10-CM

## 2019-01-13 DIAGNOSIS — J852 Abscess of lung without pneumonia: Secondary | ICD-10-CM | POA: Diagnosis not present

## 2019-01-13 DIAGNOSIS — Z79899 Other long term (current) drug therapy: Secondary | ICD-10-CM | POA: Diagnosis not present

## 2019-01-13 DIAGNOSIS — I10 Essential (primary) hypertension: Secondary | ICD-10-CM

## 2019-01-13 DIAGNOSIS — E669 Obesity, unspecified: Secondary | ICD-10-CM | POA: Diagnosis present

## 2019-01-13 DIAGNOSIS — D72829 Elevated white blood cell count, unspecified: Secondary | ICD-10-CM | POA: Diagnosis present

## 2019-01-13 DIAGNOSIS — Z791 Long term (current) use of non-steroidal anti-inflammatories (NSAID): Secondary | ICD-10-CM

## 2019-01-13 DIAGNOSIS — J449 Chronic obstructive pulmonary disease, unspecified: Secondary | ICD-10-CM | POA: Diagnosis present

## 2019-01-13 DIAGNOSIS — I471 Supraventricular tachycardia: Secondary | ICD-10-CM | POA: Diagnosis not present

## 2019-01-13 DIAGNOSIS — Z87891 Personal history of nicotine dependence: Secondary | ICD-10-CM

## 2019-01-13 DIAGNOSIS — K8020 Calculus of gallbladder without cholecystitis without obstruction: Secondary | ICD-10-CM | POA: Diagnosis not present

## 2019-01-13 DIAGNOSIS — I959 Hypotension, unspecified: Secondary | ICD-10-CM | POA: Diagnosis not present

## 2019-01-13 DIAGNOSIS — N400 Enlarged prostate without lower urinary tract symptoms: Secondary | ICD-10-CM | POA: Diagnosis present

## 2019-01-13 DIAGNOSIS — R140 Abdominal distension (gaseous): Secondary | ICD-10-CM | POA: Diagnosis not present

## 2019-01-13 DIAGNOSIS — Z978 Presence of other specified devices: Secondary | ICD-10-CM | POA: Diagnosis not present

## 2019-01-13 DIAGNOSIS — J90 Pleural effusion, not elsewhere classified: Secondary | ICD-10-CM | POA: Diagnosis present

## 2019-01-13 DIAGNOSIS — Z792 Long term (current) use of antibiotics: Secondary | ICD-10-CM

## 2019-01-13 DIAGNOSIS — J441 Chronic obstructive pulmonary disease with (acute) exacerbation: Secondary | ICD-10-CM | POA: Diagnosis not present

## 2019-01-13 DIAGNOSIS — J439 Emphysema, unspecified: Secondary | ICD-10-CM | POA: Diagnosis not present

## 2019-01-13 DIAGNOSIS — J984 Other disorders of lung: Secondary | ICD-10-CM

## 2019-01-13 DIAGNOSIS — G4733 Obstructive sleep apnea (adult) (pediatric): Secondary | ICD-10-CM | POA: Diagnosis not present

## 2019-01-13 DIAGNOSIS — Z960 Presence of urogenital implants: Secondary | ICD-10-CM | POA: Diagnosis not present

## 2019-01-13 DIAGNOSIS — D649 Anemia, unspecified: Secondary | ICD-10-CM | POA: Diagnosis not present

## 2019-01-13 DIAGNOSIS — R197 Diarrhea, unspecified: Secondary | ICD-10-CM | POA: Diagnosis not present

## 2019-01-13 DIAGNOSIS — Z4682 Encounter for fitting and adjustment of non-vascular catheter: Secondary | ICD-10-CM | POA: Diagnosis not present

## 2019-01-13 HISTORY — DX: Malignant (primary) neoplasm, unspecified: C80.1

## 2019-01-13 LAB — CBC WITH DIFFERENTIAL/PLATELET
Abs Immature Granulocytes: 0.28 10*3/uL — ABNORMAL HIGH (ref 0.00–0.07)
Basophils Absolute: 0.1 10*3/uL (ref 0.0–0.2)
Basophils Absolute: 0.1 10*3/uL (ref 0.0–0.1)
Basophils Relative: 0 %
Basos: 1 %
EOS (ABSOLUTE): 0.1 10*3/uL (ref 0.0–0.4)
Eos: 1 %
Eosinophils Absolute: 0.1 10*3/uL (ref 0.0–0.5)
Eosinophils Relative: 0 %
HCT: 38.8 % — ABNORMAL LOW (ref 39.0–52.0)
Hematocrit: 42.4 % (ref 37.5–51.0)
Hemoglobin: 14 g/dL (ref 13.0–17.7)
Hemoglobin: 13.2 g/dL (ref 13.0–17.0)
Immature Grans (Abs): 0.3 10*3/uL — ABNORMAL HIGH (ref 0.0–0.1)
Immature Granulocytes: 2 %
Immature Granulocytes: 1 %
Lymphocytes Absolute: 1.3 10*3/uL (ref 0.7–3.1)
Lymphocytes Relative: 7 %
Lymphs Abs: 1.4 10*3/uL (ref 0.7–4.0)
Lymphs: 6 %
MCH: 31.4 pg (ref 26.6–33.0)
MCH: 31.3 pg (ref 26.0–34.0)
MCHC: 33 g/dL (ref 31.5–35.7)
MCHC: 34 g/dL (ref 30.0–36.0)
MCV: 95 fL (ref 79–97)
MCV: 91.9 fL (ref 80.0–100.0)
Monocytes Absolute: 1.2 10*3/uL — ABNORMAL HIGH (ref 0.1–0.9)
Monocytes Absolute: 1.2 10*3/uL — ABNORMAL HIGH (ref 0.1–1.0)
Monocytes Relative: 6 %
Monocytes: 5 %
Neutro Abs: 18.2 10*3/uL — ABNORMAL HIGH (ref 1.7–7.7)
Neutrophils Absolute: 19 10*3/uL — ABNORMAL HIGH (ref 1.4–7.0)
Neutrophils Relative %: 86 %
Neutrophils: 85 %
Platelets: 610 10*3/uL — ABNORMAL HIGH (ref 150–450)
Platelets: 567 10*3/uL — ABNORMAL HIGH (ref 150–400)
RBC: 4.46 x10E6/uL (ref 4.14–5.80)
RBC: 4.22 MIL/uL (ref 4.22–5.81)
RDW: 12.8 % (ref 11.6–15.4)
RDW: 13.7 % (ref 11.5–15.5)
WBC: 22 10*3/uL (ref 3.4–10.8)
WBC: 21.2 10*3/uL — ABNORMAL HIGH (ref 4.0–10.5)
nRBC: 0 % (ref 0.0–0.2)

## 2019-01-13 LAB — BASIC METABOLIC PANEL
BUN/Creatinine Ratio: 34 — ABNORMAL HIGH (ref 10–24)
BUN: 34 mg/dL — ABNORMAL HIGH (ref 8–27)
CO2: 24 mmol/L (ref 20–29)
Calcium: 8.6 mg/dL (ref 8.6–10.2)
Chloride: 95 mmol/L — ABNORMAL LOW (ref 96–106)
Creatinine, Ser: 0.99 mg/dL (ref 0.76–1.27)
GFR calc Af Amer: 86 mL/min/{1.73_m2} (ref >59)
GFR calc non Af Amer: 75 mL/min/{1.73_m2} (ref >59)
Glucose: 102 mg/dL — ABNORMAL HIGH (ref 65–99)
Potassium: 3.7 mmol/L (ref 3.5–5.2)
Sodium: 138 mmol/L (ref 134–144)

## 2019-01-13 LAB — COMPREHENSIVE METABOLIC PANEL
ALT: 54 U/L — ABNORMAL HIGH (ref 0–44)
AST: 34 U/L (ref 15–41)
Albumin: 2.5 g/dL — ABNORMAL LOW (ref 3.5–5.0)
Alkaline Phosphatase: 151 U/L — ABNORMAL HIGH (ref 38–126)
Anion gap: 14 (ref 5–15)
BUN: 27 mg/dL — ABNORMAL HIGH (ref 8–23)
CO2: 26 mmol/L (ref 22–32)
Calcium: 8.3 mg/dL — ABNORMAL LOW (ref 8.9–10.3)
Chloride: 93 mmol/L — ABNORMAL LOW (ref 98–111)
Creatinine, Ser: 0.98 mg/dL (ref 0.61–1.24)
GFR calc Af Amer: >60 mL/min (ref >60)
GFR calc non Af Amer: >60 mL/min (ref >60)
Glucose, Bld: 110 mg/dL — ABNORMAL HIGH (ref 70–99)
Potassium: 3 mmol/L — ABNORMAL LOW (ref 3.5–5.1)
Sodium: 133 mmol/L — ABNORMAL LOW (ref 135–145)
Total Bilirubin: 0.7 mg/dL (ref 0.3–1.2)
Total Protein: 7.5 g/dL (ref 6.5–8.1)

## 2019-01-13 LAB — SARS CORONAVIRUS 2 (TAT 6-24 HRS): SARS Coronavirus 2: NEGATIVE

## 2019-01-13 MED ORDER — IOHEXOL 300 MG/ML  SOLN
100.0000 mL | Freq: Once | INTRAMUSCULAR | Status: AC | PRN
  Administered 2019-01-13: 100 mL via INTRAVENOUS

## 2019-01-13 MED ORDER — ACETAMINOPHEN 650 MG RE SUPP: 650.0000 mg | Freq: Four times a day (QID) | RECTAL | Status: DC | PRN

## 2019-01-13 MED ORDER — METRONIDAZOLE IN NACL 5-0.79 MG/ML-% IV SOLN
500.0000 mg | Freq: Once | INTRAVENOUS | Status: AC
  Administered 2019-01-13: 14:00:00 500 mg via INTRAVENOUS
  Filled 2019-01-13: qty 100

## 2019-01-13 MED ORDER — SODIUM CHLORIDE 0.9 % IV SOLN
1.0000 g | INTRAVENOUS | Status: DC
  Administered 2019-01-14 – 2019-01-20 (×7): 1 g via INTRAVENOUS
  Filled 2019-01-13: qty 10
  Filled 2019-01-13: qty 1
  Filled 2019-01-13 (×2): qty 10
  Filled 2019-01-13: qty 1
  Filled 2019-01-13: qty 10
  Filled 2019-01-13 (×3): qty 1

## 2019-01-13 MED ORDER — HYDROCHLOROTHIAZIDE 25 MG PO TABS
25.0000 mg | ORAL_TABLET | Freq: Every day | ORAL | Status: DC
  Administered 2019-01-13: 20:00:00 25 mg via ORAL
  Filled 2019-01-13 (×2): qty 1

## 2019-01-13 MED ORDER — SODIUM CHLORIDE 0.9 % IV SOLN
INTRAVENOUS | Status: DC
  Administered 2019-01-13 – 2019-01-15 (×2): via INTRAVENOUS

## 2019-01-13 MED ORDER — METRONIDAZOLE IN NACL 5-0.79 MG/ML-% IV SOLN
500.0000 mg | Freq: Three times a day (TID) | INTRAVENOUS | Status: DC
  Administered 2019-01-13 – 2019-01-22 (×27): 500 mg via INTRAVENOUS
  Filled 2019-01-13 (×31): qty 100

## 2019-01-13 MED ORDER — ACETAMINOPHEN 325 MG PO TABS: 650.0000 mg | ORAL_TABLET | Freq: Four times a day (QID) | ORAL | Status: DC | PRN

## 2019-01-13 MED ORDER — OMEGA-3-ACID ETHYL ESTERS 1 G PO CAPS
1.0000 g | ORAL_CAPSULE | Freq: Every day | ORAL | Status: DC
  Administered 2019-01-13 – 2019-01-19 (×6): 1 g via ORAL
  Filled 2019-01-13 (×6): qty 1

## 2019-01-13 MED ORDER — SODIUM CHLORIDE 0.9 % IV SOLN
1.0000 g | Freq: Once | INTRAVENOUS | Status: AC
  Administered 2019-01-13: 14:00:00 1 g via INTRAVENOUS
  Filled 2019-01-13: qty 10

## 2019-01-13 MED ORDER — KETOROLAC TROMETHAMINE 15 MG/ML IJ SOLN
15.0000 mg | Freq: Four times a day (QID) | INTRAMUSCULAR | Status: DC | PRN
  Filled 2019-01-13: qty 1

## 2019-01-13 MED ORDER — FERROUS SULFATE 325 (65 FE) MG PO TABS
325.0000 mg | ORAL_TABLET | Freq: Every day | ORAL | Status: DC
  Administered 2019-01-13 – 2019-01-25 (×12): 325 mg via ORAL
  Filled 2019-01-13 (×12): qty 1

## 2019-01-13 MED ORDER — ALBUTEROL SULFATE (2.5 MG/3ML) 0.083% IN NEBU: 2.5000 mg | INHALATION_SOLUTION | RESPIRATORY_TRACT | Status: DC | PRN

## 2019-01-13 MED ORDER — ONDANSETRON HCL 4 MG PO TABS
4.0000 mg | ORAL_TABLET | Freq: Four times a day (QID) | ORAL | Status: DC | PRN
  Filled 2019-01-13: qty 1

## 2019-01-13 MED ORDER — POLYETHYLENE GLYCOL 3350 17 G PO PACK
17.0000 g | PACK | Freq: Every day | ORAL | Status: DC | PRN
  Filled 2019-01-13: qty 1

## 2019-01-13 MED ORDER — AMLODIPINE BESYLATE 10 MG PO TABS
10.0000 mg | ORAL_TABLET | Freq: Every day | ORAL | Status: DC
  Administered 2019-01-13 – 2019-01-20 (×7): 10 mg via ORAL
  Filled 2019-01-13 (×7): qty 1

## 2019-01-13 MED ORDER — TAMSULOSIN HCL 0.4 MG PO CAPS
0.4000 mg | ORAL_CAPSULE | Freq: Every day | ORAL | Status: DC
  Administered 2019-01-13 – 2019-01-20 (×7): 0.4 mg via ORAL
  Filled 2019-01-13 (×7): qty 1

## 2019-01-13 MED ORDER — CARVEDILOL 25 MG PO TABS
25.0000 mg | ORAL_TABLET | Freq: Two times a day (BID) | ORAL | Status: DC
  Administered 2019-01-14 – 2019-01-20 (×11): 25 mg via ORAL
  Filled 2019-01-13 (×11): qty 1

## 2019-01-13 MED ORDER — HEPARIN SODIUM (PORCINE) 5000 UNIT/ML IJ SOLN
5000.0000 [IU] | Freq: Three times a day (TID) | INTRAMUSCULAR | Status: AC
  Administered 2019-01-13 – 2019-01-14 (×4): 5000 [IU] via SUBCUTANEOUS
  Filled 2019-01-13 (×6): qty 1

## 2019-01-13 MED ORDER — DOCUSATE SODIUM 100 MG PO CAPS
100.0000 mg | ORAL_CAPSULE | Freq: Two times a day (BID) | ORAL | Status: DC
  Administered 2019-01-13 – 2019-01-22 (×11): 100 mg via ORAL
  Filled 2019-01-13 (×20): qty 1

## 2019-01-13 MED ORDER — LOSARTAN POTASSIUM 50 MG PO TABS
100.0000 mg | ORAL_TABLET | Freq: Every day | ORAL | Status: DC
  Administered 2019-01-13 – 2019-01-20 (×7): 100 mg via ORAL
  Filled 2019-01-13 (×7): qty 2

## 2019-01-13 MED ORDER — BISACODYL 5 MG PO TBEC
5.0000 mg | DELAYED_RELEASE_TABLET | Freq: Every day | ORAL | Status: DC | PRN
  Filled 2019-01-13: qty 1

## 2019-01-13 MED ORDER — TRAMADOL HCL 50 MG PO TABS
50.0000 mg | ORAL_TABLET | Freq: Four times a day (QID) | ORAL | Status: DC | PRN
  Administered 2019-01-15 (×2): 50 mg via ORAL
  Filled 2019-01-13 (×2): qty 1

## 2019-01-13 MED ORDER — BACLOFEN 10 MG PO TABS
10.0000 mg | ORAL_TABLET | Freq: Every day | ORAL | Status: DC
  Administered 2019-01-13 – 2019-01-27 (×15): 10 mg via ORAL
  Filled 2019-01-13 (×17): qty 1

## 2019-01-13 MED ORDER — PANTOPRAZOLE SODIUM 40 MG PO TBEC
40.0000 mg | DELAYED_RELEASE_TABLET | Freq: Every day | ORAL | Status: DC
  Administered 2019-01-13 – 2019-01-28 (×15): 40 mg via ORAL
  Filled 2019-01-13 (×15): qty 1

## 2019-01-13 MED ORDER — ONDANSETRON HCL 4 MG/2ML IJ SOLN
4.0000 mg | Freq: Four times a day (QID) | INTRAMUSCULAR | Status: DC | PRN
  Administered 2019-01-24: 4 mg via INTRAVENOUS
  Filled 2019-01-13: qty 2

## 2019-01-13 NOTE — Telephone Encounter (Signed)
WBC staying the same at 22. No change with abx. Will obtain STAT CT abdomen pelvis to rule out diverticulitis or other intraabdominal issue. Kidney function improving. Will make patient aware.

## 2019-01-13 NOTE — H&P (Signed)
[facility] [place] NAME: Jonathan Burns    MR#:  [id number]  DATE OF BIRTH:  [birth year]  DATE OF ADMISSION:  01/13/2019  PRIMARY CARE PHYSICIAN: Valerie Roys, DO   REQUESTING/REFERRING PHYSICIAN: Harvest Dark  Patient coming from : home   CHIEF COMPLAINT:  left flank pain for a week  HISTORY OF PRESENT ILLNESS:  Jonathan Burns  is a 73 y.o. male with a known history of adenocarcinoma of the prostate status post radiation therapy, peptic ulcer disease, COPD, hyperlipidemia, hypertension comes from home with complaints of left flank pain on on and off for week to 10 days. Patient was seen by primary care physician. Routine labs showed elevated creatinine and white count about 20,000. He was empirically treated with Augmentin. Repeat white count remained 22,000. Creatinine improved after patient hydrated himself at home.  Patient underwent CT scan of the abdomen and was found to have lock alerted fluid collection in the left pleural space worrisome for Empyema. ECP asked patient to come to the emergency room for further evaluation management. Patient denied any chest pain or shortness of breath. He had left flank pain which worsened on movement and activity  In the emergency room, remains afebrile with normal heart rate and stable blood pressure. His white count today is 21.2. Patient received IV Rocephin and Flagyl. He is being admitted for further evaluation management  ER physician spoke with Dr. Genevive Bi cardiothoracic surgeon who recommended hospitalist admit patient.  PAST MEDICAL HISTORY:   Past Medical History:  Diagnosis Date  . Anemia   . BPH (benign prostatic hypertrophy)   . Cancer ([place])   . Chronic GI bleeding   . COPD (chronic obstructive pulmonary disease) ([place])   . Hyperlipidemia   . Hypertension   . Prostate nodule   . PUD (peptic ulcer disease)   . Rising PSA level     PAST SURGICAL HISTOIRY:   Past Surgical  History:  Procedure Laterality Date  . COLONOSCOPY  2015  . ESOPHAGOGASTRODUODENOSCOPY  2015    SOCIAL HISTORY:   Social History   Tobacco Use  . Smoking status: Former Smoker    Packs/day: 1.00    Years: 30.00    Pack years: 30.00    Types: Cigarettes    Quit date: 08/17/1986    Years since quitting: 32.4  . Smokeless tobacco: Never Used  Substance Use Topics  . Alcohol use: Yes    Alcohol/week: 0.0 standard drinks    Comment: occasional    FAMILY HISTORY:   Family History  Problem Relation Age of Onset  . Cancer Mother        breast  . Diabetes Mother   . Aneurysm Father   . Kidney disease Neg Hx   . Prostate cancer Neg Hx   . Kidney cancer Neg Hx     DRUG ALLERGIES:  No Known Allergies  REVIEW OF SYSTEMS:  Review of Systems  Constitutional: Negative for chills, fever and weight loss.  HENT: Negative for ear discharge, ear pain and nosebleeds.   Eyes: Negative for blurred vision, pain and discharge.  Respiratory: Negative for sputum production, shortness of breath, wheezing and stridor.   Cardiovascular: Negative for chest pain, palpitations, orthopnea and PND.  Gastrointestinal: Negative for abdominal pain, diarrhea, nausea and vomiting.  Genitourinary: Negative for frequency and urgency.  Musculoskeletal: Positive for back pain. Negative for joint pain.  Neurological: Negative for sensory change, speech change, focal weakness and weakness.  Psychiatric/Behavioral:  Negative for depression and hallucinations. The patient is not nervous/anxious.      MEDICATIONS AT HOME:   Prior to Admission medications   Medication Sig Start Date End Date Taking? Authorizing Provider  amLODipine (NORVASC) 10 MG tablet Take 1 tablet (10 mg total) by mouth daily. 08/06/18  Yes Crissman, Jeannette How, MD  amoxicillin-clavulanate (AUGMENTIN) 875-125 MG tablet Take 1 tablet by mouth 2 (two) times daily. 01/06/19  Yes Johnson, Megan P, DO  baclofen (LIORESAL) 10 MG tablet Take 1  tablet (10 mg total) by mouth at bedtime. 01/06/19  Yes Johnson, Megan P, DO  carvedilol (COREG) 25 MG tablet Take 1 tablet (25 mg total) by mouth 2 (two) times daily with a meal. 08/06/18  Yes Crissman, Jeannette How, MD  Ferrous Sulfate (IRON) 325 (65 FE) MG TABS Take 1 tablet by mouth daily. 09/07/14  Yes Crissman, Jeannette How, MD  hydrochlorothiazide (HYDRODIURIL) 25 MG tablet Take 1 tablet (25 mg total) by mouth daily. 08/06/18  Yes Crissman, Jeannette How, MD  losartan (COZAAR) 100 MG tablet Take 1 tablet (100 mg total) by mouth daily. 08/06/18  Yes Crissman, Jeannette How, MD  meloxicam (MOBIC) 15 MG tablet Take 1 tablet (15 mg total) by mouth daily. 08/06/18  Yes Crissman, Jeannette How, MD  Multiple Vitamins-Minerals (MENS MULTIVITAMIN PLUS PO) Take 1 tablet by mouth daily.    Yes [provider]  Omega-3 Fatty Acids (FISH OIL) 1000 MG CAPS Take 1,000 mg by mouth daily.    Yes [provider]  pantoprazole (PROTONIX) 40 MG tablet Take 1 tablet (40 mg total) by mouth daily. 08/06/18  Yes Crissman, Jeannette How, MD  tamsulosin (FLOMAX) 0.4 MG CAPS capsule Take 1 capsule (0.4 mg total) by mouth daily. 08/06/18  Yes Crissman, Jeannette How, MD      VITAL SIGNS:  Blood pressure 120/67, pulse 88, temperature 98.3 F (36.8 C), temperature source Oral, resp. rate 16, SpO2 94 %.  PHYSICAL EXAMINATION:  GENERAL:  73 y.o.-year-old patient lying in the bed with no acute distress.  EYES: Pupils equal, round, reactive to light and accommodation. No scleral icterus. Extraocular muscles intact.  HEENT: Head atraumatic, normocephalic. Oropharynx and nasopharynx clear.  NECK:  Supple, no jugular venous distention. No thyroid enlargement, no tenderness.  LUNGS: Normal breath sounds bilaterally, no wheezing, rales,rhonchi or crepitation. No use of accessory muscles of respiration.  CARDIOVASCULAR: S1, S2 normal. No murmurs, rubs, or gallops.  ABDOMEN: Soft, nontender, nondistended. Bowel sounds present. No organomegaly or mass.   EXTREMITIES: No pedal edema, cyanosis, or clubbing.  NEUROLOGIC: Cranial nerves II through XII are intact. Muscle strength 5/5 in all extremities. Sensation intact. Gait not checked.  PSYCHIATRIC: The patient is alert and oriented x 3.  SKIN: No obvious rash, lesion, or ulcer.   LABORATORY PANEL:   CBC Recent Labs  Lab 01/13/19 1208  WBC 21.2*  HGB 13.2  HCT 38.8*  PLT 567*   ------------------------------------------------------------------------------------------------------------------  Chemistries  Recent Labs  Lab 01/13/19 1208  NA 133*  K 3.0*  CL 93*  CO2 26  GLUCOSE 110*  BUN 27*  CREATININE 0.98  CALCIUM 8.3*  AST 34  ALT 54*  ALKPHOS 151*  BILITOT 0.7   ------------------------------------------------------------------------------------------------------------------  Cardiac Enzymes No results for input(s): TROPONINI in the last 168 hours. ------------------------------------------------------------------------------------------------------------------  RADIOLOGY:  Ct Abdomen Pelvis W Contrast  Result Date: 01/13/2019 CLINICAL DATA:  73 year old male with history of prostate cancer presenting with leukocytosis. History of prostate cancer. EXAM: CT ABDOMEN AND PELVIS WITH  CONTRAST TECHNIQUE: Multidetector CT imaging of the abdomen and pelvis was performed using the standard protocol following bolus administration of intravenous contrast. CONTRAST:  187mL OMNIPAQUE IOHEXOL 300 MG/ML  SOLN COMPARISON:  CT of the abdomen pelvis dated 05/08/2017. FINDINGS: Lower chest: There is a partially visualized loculated appearing moderate-sized left pleural effusion. There is apparent mild enhancement of the adjacent pleural surface. Clinical correlation is recommended to evaluate for possible empyema. There is a rounded loculated component of this fluid within the left lower lobe parenchyma which contains a small pocket of air. The small pockets of air may be related to  infectious etiology although a bronchopleural fistula is not excluded. Clinical correlation is recommended. There is partial compressive atelectasis of the left lower lobe. The visualized right lung base is clear. No intra-abdominal free air or free fluid. Hepatobiliary: There is diffuse fatty infiltration of the liver. There is a 3 cm cyst in the left lobe of the liver. No intrahepatic biliary ductal dilatation. A 15 mm cyst is also noted in the caudate lobe. There is a stone in the gallbladder. No pericholecystic fluid or evidence of acute cholecystitis. Pancreas: Unremarkable. No pancreatic ductal dilatation or surrounding inflammatory changes. Spleen: Normal in size without focal abnormality. Adrenals/Urinary Tract: The adrenal glands are unremarkable. There is no hydronephrosis on either side. There is symmetric enhancement and excretion of contrast by both kidneys. Subcentimeter bilateral renal hypodense foci are too small to characterize. The visualized ureters appear unremarkable. The urinary bladder is collapsed. There is apparent diffuse thickening of the bladder wall which may be partly related to underdistention. Cystitis is not excluded. Correlation with urinalysis recommended. Stomach/Bowel: There is sigmoid diverticulosis without active inflammatory changes. There is a small hiatal hernia. There is no bowel obstruction or active inflammation. The appendix is normal. Vascular/Lymphatic: Advanced aortoiliac atherosclerotic disease. The IVC is unremarkable. No portal venous gas. There is no adenopathy. Reproductive: The prostate and seminal vesicles are grossly unremarkable. No pelvic mass. Other: Small fat containing bilateral inguinal hernias as well as small fat containing umbilical hernia. Musculoskeletal: Osteopenia with degenerative changes of the spine and multilevel disc desiccation and vacuum phenomena. No acute osseous pathology. IMPRESSION: 1. Partially visualized loculated appearing  moderate-sized left pleural effusion with findings concerning for empyema. Clinical correlation is recommended. 2. Fatty liver. 3. Cholelithiasis. 4. Sigmoid diverticulosis. No bowel obstruction or active inflammation. Normal appendix. 5. Aortic Atherosclerosis (ICD10-I70.0). Electronically Signed   By: Anner Crete M.D.   On: 01/13/2019 11:32    EKG:    IMPRESSION AND PLAN:   Jonathan Burns  is a 73 y.o. male with a known history of adenocarcinoma of the prostate status post radiation therapy, peptic ulcer disease, COPD, hyperlipidemia, hypertension comes from home with complaints of left flank pain on on and off for week to 10 days.  1. Left pleural space Empyema -came in with left upper flank lower rib cage area pain for 7 to 10 days. Workup showed elevated white count of 22,000. -CT abdomen reveals partially visualize lock alerted appearing moderate size left pleural effusion concerning for empyema -General surgery consultation placed. Message sent to Dr. Christian Mate. -IV Rocephin and Flagyl-- pharmacy to dose -follow blood culture -will obtain CT scan of the chest with contrast tomorrow after giving adequate IV fluids and checking creatinine  2. Hypokalemia suspected due to diuretic -pharmacy to replace orally  3. Hypertension-- controlled -continue hydrochlorothiazide, amlodipine, losartan, Coreg  4. Leukocytosis due to number one  5. History of peptic ulcer disease/Gerd -continue Protonix  6. Adenocarcinoma of the prostate stage III B status post radiation treatment and cancer center -continue Flomax  7. DVT prophylaxis subcu Lovenox  Family Communication: updated patient in the ER Consults: general surgery Code Status: full DVT prophylaxis: Lovenox  TOTAL TIME TAKING CARE OF THIS PATIENT: *50* minutes.    Fritzi Mandes M.D on 01/13/2019 at 3:39 PM  Between 7am to 6pm - Pager - [phone]  After 6pm go to [email or web address] - password [email or web address] [facility]     CC: Primary care physician; Valerie Roys, DO

## 2019-01-13 NOTE — Telephone Encounter (Signed)
Called and discussed results with patient. He has not had anything except sips of water since 6PM last night. Will not eat anything today. Awaiting scheduling's call

## 2019-01-13 NOTE — Consult Note (Signed)
Pharmacy Antibiotic Note  Jonathan Burns is a 73 y.o. male admitted on 01/13/2019 with pneumonia and potential intra-abdominal infection.Marland Kitchen  Pharmacy has been consulted for Rocephin and Flagyl dosing. Recent CT from PCP showed left-sided empyema. Was started on PO antibiotics but still complaining of left flank pain. Chest x-ray still concerning for empyema. Will start patient on IV antibiotics in-patient.  Plan: 1) Rocephin 1g IV Q24 hours.  2) Metronidazole 500mg  IV Q8 hours.      Temp (24hrs), Avg:98.3 F (36.8 C), Min:98.3 F (36.8 C), Max:98.3 F (36.8 C)  Recent Labs  Lab 01/12/19 0823 01/13/19 1208  WBC 22.0* 21.2*  CREATININE 0.99 0.98    Estimated Creatinine Clearance: 75.5 mL/min (by C-G formula based on SCr of 0.98 mg/dL).    No Known Allergies  Antimicrobials this admission: Ceftriaxone 11/25 >>  Metronidazole 11/25 >>    Microbiology results: 11/25 BCx: pending  Thank you for allowing pharmacy to be a part of this patient's care.  Jonathan Burns A Jonathan Burns 01/13/2019 3:12 PM

## 2019-01-13 NOTE — ED Notes (Signed)
Pt states he has been having elevated WBC and was sent for outpt CT of abd. Was informed that it showed "a pus pocket on the outside of my lung on my left side." pt states he was told to come to ER. A&O, ambulatory. No distress noted.

## 2019-01-13 NOTE — ED Triage Notes (Signed)
Pt was sent from PCP for abnormal CT scan results this am. Pt states he was told he had " pus pocket in his lung". Denies any SOB or fever.

## 2019-01-13 NOTE — ED Triage Notes (Signed)
Brought from out patietn CT for empyema.  He has an iv.

## 2019-01-13 NOTE — Consult Note (Signed)
Patient ID: Jonathan Burns, male   DOB: [birth year], 73 y.o.   MRN: [id number]  Chief Complaint: Loculated fluid collection left chest, possible empyema.  History of Present Illness Jonathan Burns is a 73 y.o. male with a 2-week history of malaise, worsening easy fatigability, and eventually left flank pain.  Primary care work-up involved treatment with amoxicillin for an increased white blood cell count.  This did not diminish and so a CT scan was obtained looking for source.  CT scan abdomen pelvis was obtained today which suggested is consistent with a loculated fluid collection in the basilar posterior left chest.  CT of the chest has been ordered and will be deferred to tomorrow due to contrast load.  This gentleman otherwise denies chest pain, shortness of breath or dyspnea.   Past Medical History Past Medical History:  Diagnosis Date  . Anemia   . BPH (benign prostatic hypertrophy)   . Cancer ([place])   . Chronic GI bleeding   . COPD (chronic obstructive pulmonary disease) ([place])   . Hyperlipidemia   . Hypertension   . Prostate nodule   . PUD (peptic ulcer disease)   . Rising PSA level       Past Surgical History:  Procedure Laterality Date  . COLONOSCOPY  2015  . ESOPHAGOGASTRODUODENOSCOPY  2015    No Known Allergies  Current Facility-Administered Medications  Medication Dose Route Frequency Provider Last Rate Last Dose  . 0.9 %  sodium chloride infusion   Intravenous Continuous Fritzi Mandes, MD 75 mL/hr at 01/13/19 1703    . acetaminophen (TYLENOL) tablet 650 mg  650 mg Oral Q6H PRN Fritzi Mandes, MD       Or  . acetaminophen (TYLENOL) suppository 650 mg  650 mg Rectal Q6H PRN Fritzi Mandes, MD      . albuterol (PROVENTIL) (2.5 MG/3ML) 0.083% nebulizer solution 2.5 mg  2.5 mg Nebulization Q2H PRN Fritzi Mandes, MD      . bisacodyl (DULCOLAX) EC tablet 5 mg  5 mg Oral Daily PRN Fritzi Mandes, MD      . Derrill Memo ON 01/14/2019] cefTRIAXone (ROCEPHIN) 1 g in sodium chloride 0.9 % 100 mL IVPB  1  g Intravenous Q24H Nazari, Walid A, RPH      . docusate sodium (COLACE) capsule 100 mg  100 mg Oral BID Fritzi Mandes, MD   100 mg at 01/13/19 1515  . heparin injection 5,000 Units  5,000 Units Subcutaneous Q8H Fritzi Mandes, MD   5,000 Units at 01/13/19 1515  . ketorolac (TORADOL) 15 MG/ML injection 15 mg  15 mg Intravenous Q6H PRN Fritzi Mandes, MD      . metroNIDAZOLE (FLAGYL) IVPB 500 mg  500 mg Intravenous Q8H Nazari, Walid A, RPH      . ondansetron (ZOFRAN) tablet 4 mg  4 mg Oral Q6H PRN Fritzi Mandes, MD       Or  . ondansetron (ZOFRAN) injection 4 mg  4 mg Intravenous Q6H PRN Fritzi Mandes, MD      . polyethylene glycol (MIRALAX / GLYCOLAX) packet 17 g  17 g Oral Daily PRN Fritzi Mandes, MD      . traMADol Veatrice Bourbon) tablet 50 mg  50 mg Oral Q6H PRN Fritzi Mandes, MD        Family History Family History  Problem Relation Age of Onset  . Cancer Mother        breast  . Diabetes Mother   . Aneurysm Father   . Kidney disease Neg Hx   .  Prostate cancer Neg Hx   . Kidney cancer Neg Hx       Social History Social History   Tobacco Use  . Smoking status: Former Smoker    Packs/day: 1.00    Years: 30.00    Pack years: 30.00    Types: Cigarettes    Quit date: 08/17/1986    Years since quitting: 32.4  . Smokeless tobacco: Never Used  Substance Use Topics  . Alcohol use: Yes    Alcohol/week: 0.0 standard drinks    Comment: occasional  . Drug use: No        ROS Review of Systems  Constitutional: Negative for chills, fever and weight loss.  HENT: Negative for ear discharge, ear pain and nosebleeds.   Eyes: Negative for blurred vision, pain and discharge.  Respiratory: Negative for sputum production, shortness of breath, wheezing and stridor.   Cardiovascular: Negative for chest pain, palpitations, orthopnea and PND.  Gastrointestinal: Negative for abdominal pain, diarrhea, nausea and vomiting.  Genitourinary: Negative for frequency and urgency.  Musculoskeletal: Positive for back  pain. Negative for joint pain.  Neurological: Negative for sensory change, speech change, focal weakness and weakness.  Psychiatric/Behavioral: Negative for depression and hallucinations. The patient is not nervous/anxious.     Physical Exam Blood pressure 110/78, pulse 97, temperature 98.3 F (36.8 C), temperature source Oral, resp. rate 17, SpO2 94 %.   CONSTITUTIONAL: Well developed, and nourished, appropriately responsive and aware, daughter at bedside who assists with history.  He currently appears sitting upright without septic appearance and in no distress.   EYES: Sclera non-icteric.   EARS, NOSE, MOUTH AND THROAT: Mask worn.  The oropharynx is clear.  Oral mucosa is pink and moist. Hearing is intact to voice.  NECK: Trachea is midline, and there is no jugular venous distension.  LYMPH NODES:  Lymph nodes in the neck are not enlarged. RESPIRATORY:  Lungs are clear, and breath sounds are somewhat diminished on the left base/posteriorly. Normal respiratory effort without pathologic use of accessory muscles. CARDIOVASCULAR: Heart is regular in rate and rhythm. GI: The abdomen is soft, nontender, and nondistended.  MUSCULOSKELETAL:  Symmetrical muscle tone appreciated.    SKIN: Skin turgor is normal. No pathologic skin lesions appreciated.  NEUROLOGIC:  Motor and sensation appear grossly normal.  Cranial nerves are grossly without defect. PSYCH:  Alert and oriented to person, place and time. Affect is appropriate for situation.  Data Reviewed I have personally reviewed what is currently available of the patient's imaging, recent labs and medical records.    @RCNTLABS @  Labs Reviewed  CBC WITH DIFFERENTIAL/PLATELET - Abnormal; Notable for the following components:      Result Value   WBC 21.2 (*)    HCT 38.8 (*)    Platelets 567 (*)    Neutro Abs 18.2 (*)    Monocytes Absolute 1.2 (*)    Abs Immature Granulocytes 0.28 (*)    All other components within normal limits   COMPREHENSIVE METABOLIC PANEL - Abnormal; Notable for the following components:   Sodium 133 (*)    Potassium 3.0 (*)    Chloride 93 (*)    Glucose, Bld 110 (*)    BUN 27 (*)    Calcium 8.3 (*)    Albumin 2.5 (*)    ALT 54 (*)    Alkaline Phosphatase 151 (*)    All other components within normal limits  CULTURE, BLOOD (ROUTINE X 2)  CULTURE, BLOOD (ROUTINE X 2)  SARS CORONAVIRUS 2 (TAT  6-24 HRS)  CREATININE, SERUM   Abdominal/pelvic CT imaging reviewed.  Assessment    Nontoxic/nonseptic appearing patient in no acute distress. Probable left-sided empyema/loculated fluid collection.  Patient Active Problem List   Diagnosis Date Noted  . Empyema of lung ([place]) 01/13/2019  . Fatigue 01/06/2019  . GERD (gastroesophageal reflux disease) 08/25/2018  . Iron (Fe) deficiency anemia 08/25/2018  . Trigger finger, right middle finger 02/02/2018  . Advanced care planning/counseling discussion 05/27/2017  . Prostate cancer ([place]) 05/27/2017  . BPH with obstruction/lower urinary tract symptoms 04/15/2015  . Acute anxiety 02/23/2015  . Prostate nodule 10/10/2014  . OSA on CPAP 08/31/2014  . Chronic GI bleeding 08/17/2014  . Hyperlipidemia   . Hypertension   . COPD (chronic obstructive pulmonary disease) ([place])     Plan  At the patient's behest and my own discretion, I feel we can defer placement of definitive drainage until after CT of the chest is obtained. Due to the holiday weekend, I will be transferring this patient to the care of Dr. Hampton Abbot and the call team over the weekend. This was discussed with the patient and his daughter at his bedside.      Face-to-face time spent with the patient and accompanying care providers(if present) was 40 minutes, with more than 50% of the time spent counseling, educating, and coordinating care of the patient.      Ronny Bacon 01/13/2019, 6:21 PM

## 2019-01-13 NOTE — Telephone Encounter (Signed)
Referral Coordinators notified and are working on scheduling. Patient notified as well and is expecting call shortly with time and place for scan.

## 2019-01-13 NOTE — ED Provider Notes (Signed)
[facility] Emergency Department Provider Note  Time seen: 1:42 PM  I have reviewed the triage vital signs and the nursing notes.   HISTORY  Chief Complaint Abnormal Lab   HPI Jonathan Burns is a 73 y.o. male with a past medical history of anemia, COPD, hypertension, hyperlipidemia, presents to the emergency department referred after a CT scan.   According to the patient for the past week or 2 he has been experiencing some pain in his left flank.  He was seen by his doctor had a very elevated white blood cell count.  Patient saw his doctor who started him on some antibiotics and saw him again several days later continued having a very elevated white blood cell count so he ordered a CT scan which was performed today.  CT scan shows a left-sided empyema.  Patient's vitals are reassuring denies any fever at any point.  Denies any cough or shortness of breath.  States very mild discomfort in the left side currently.  Past Medical History:  Diagnosis Date  . Anemia   . BPH (benign prostatic hypertrophy)   . Cancer ([place])   . Chronic GI bleeding   . COPD (chronic obstructive pulmonary disease) ([place])   . Hyperlipidemia   . Hypertension   . Prostate nodule   . PUD (peptic ulcer disease)   . Rising PSA level     Patient Active Problem List   Diagnosis Date Noted  . Fatigue 01/06/2019  . GERD (gastroesophageal reflux disease) 08/25/2018  . Iron (Fe) deficiency anemia 08/25/2018  . Trigger finger, right middle finger 02/02/2018  . Advanced care planning/counseling discussion 05/27/2017  . Prostate cancer ([place]) 05/27/2017  . BPH with obstruction/lower urinary tract symptoms 04/15/2015  . Acute anxiety 02/23/2015  . Prostate nodule 10/10/2014  . OSA on CPAP 08/31/2014  . Chronic GI bleeding 08/17/2014  . Hyperlipidemia   . Hypertension   . COPD (chronic obstructive pulmonary disease) ([place])     Past Surgical History:  Procedure Laterality Date  . COLONOSCOPY   2015  . ESOPHAGOGASTRODUODENOSCOPY  2015    Prior to Admission medications   Medication Sig Start Date End Date Taking? Authorizing Provider  amLODipine (NORVASC) 10 MG tablet Take 1 tablet (10 mg total) by mouth daily. 08/06/18   Guadalupe Maple, MD  amoxicillin-clavulanate (AUGMENTIN) 875-125 MG tablet Take 1 tablet by mouth 2 (two) times daily. 01/06/19   Johnson, Megan P, DO  baclofen (LIORESAL) 10 MG tablet Take 1 tablet (10 mg total) by mouth at bedtime. 01/06/19   Johnson, Megan P, DO  Calcium Carbonate-Vitamin D3 (CALCIUM 600+D3) 600-400 MG-UNIT TABS Take 1 tablet by mouth daily.    [provider]  carvedilol (COREG) 25 MG tablet Take 1 tablet (25 mg total) by mouth 2 (two) times daily with a meal. 08/06/18   Crissman, Jeannette How, MD  Ferrous Sulfate (IRON) 325 (65 FE) MG TABS Take 1 tablet by mouth daily. Patient taking differently: Take 2 tablets by mouth daily.  09/07/14   Guadalupe Maple, MD  hydrochlorothiazide (HYDRODIURIL) 25 MG tablet Take 1 tablet (25 mg total) by mouth daily. 08/06/18   Guadalupe Maple, MD  LORazepam (ATIVAN) 1 MG tablet TAKE ONE TABLET DAILY IF NEEDED FOR ANXIETY. 08/06/18   Guadalupe Maple, MD  losartan (COZAAR) 100 MG tablet Take 1 tablet (100 mg total) by mouth daily. 08/06/18   Guadalupe Maple, MD  meloxicam (MOBIC) 15 MG tablet Take 1 tablet (15 mg  total) by mouth daily. 08/06/18   Guadalupe Maple, MD  Multiple Vitamins-Minerals (MENS MULTIVITAMIN PLUS PO) Take by mouth daily.    [provider]  Omega-3 Fatty Acids (FISH OIL) 1000 MG CAPS Take by mouth.    [provider]  pantoprazole (PROTONIX) 40 MG tablet Take 1 tablet (40 mg total) by mouth daily. 08/06/18   Guadalupe Maple, MD  tamsulosin (FLOMAX) 0.4 MG CAPS capsule Take 1 capsule (0.4 mg total) by mouth daily. 08/06/18   Guadalupe Maple, MD    No Known Allergies  Family History  Problem Relation Age of Onset  . Cancer Mother        breast  . Diabetes Mother   .  Aneurysm Father   . Kidney disease Neg Hx   . Prostate cancer Neg Hx   . Kidney cancer Neg Hx     Social History Social History   Tobacco Use  . Smoking status: Former Smoker    Packs/day: 1.00    Years: 30.00    Pack years: 30.00    Types: Cigarettes    Quit date: 08/17/1986    Years since quitting: 32.4  . Smokeless tobacco: Never Used  Substance Use Topics  . Alcohol use: Yes    Alcohol/week: 0.0 standard drinks    Comment: occasional  . Drug use: No    Review of Systems Constitutional: Negative for fever. Cardiovascular: Negative for chest pain. Respiratory: Negative for shortness of breath.  Negative for cough. Gastrointestinal: Mild left upper abdominal pain, dull. Genitourinary: Negative for urinary compaints Musculoskeletal: Negative for musculoskeletal complaints Neurological: Negative for headache All other ROS negative  ____________________________________________   PHYSICAL EXAM:  VITAL SIGNS: ED Triage Vitals [01/13/19 1159]  Enc Vitals Group     BP 130/65     Pulse Rate 94     Resp 16     Temp 98.3 F (36.8 C)     Temp Source Oral     SpO2 96 %     Weight      Height      Head Circumference      Peak Flow      Pain Score 1     Pain Loc      Pain Edu?      Excl. in [place]?     Constitutional: Alert and oriented. Well appearing and in no distress. Eyes: Normal exam ENT      Head: Normocephalic and atraumatic      Mouth/Throat: Mucous membranes are moist. Cardiovascular: Normal rate, regular rhythm.  Respiratory: Normal respiratory effort without tachypnea nor retractions. Breath sounds are clear  Gastrointestinal: Soft and nontender. No distention.   Musculoskeletal: Nontender with normal range of motion in all extremities.  Neurologic:  Normal speech and language. No gross focal neurologic deficits  Skin:  Skin is warm, dry and intact.  Psychiatric: Mood and affect are normal.   ____________________________________________   INITIAL  IMPRESSION / ASSESSMENT AND PLAN / ED COURSE  Pertinent labs & imaging results that were available during my care of the patient were reviewed by me and considered in my medical decision making (see chart for details).   Patient presents to the emergency department for an empyema/loculated effusion in the left chest.  Patient has a significant leukocytosis of 21,000.  I reviewed the patient's CT scan of his abdomen/pelvis showing what appears to be a moderate size left pleural effusion possible empyema.  Discussed the patient with Dr. Faith Rogue who believes  we could manage the patient locally.  We will admit to the hospitalist service.  We will start the patient on IV antibiotics, send blood cultures.  Ideally the patient would have a CT scan of the chest with contrast however as he just had a contrasted CT scan we will defer the CT of the chest until likely tomorrow, as I do not believe this would change the immediate care plan.  Patient agreeable to plan of care.  Jonathan Burns was evaluated in Emergency Department on 01/13/2019 for the symptoms described in the history of present illness. He was evaluated in the context of the global COVID-19 pandemic, which necessitated consideration that the patient might be at risk for infection with the SARS-CoV-2 virus that causes COVID-19. Institutional protocols and algorithms that pertain to the evaluation of patients at risk for COVID-19 are in a state of rapid change based on information released by regulatory bodies including the CDC and federal and state organizations. These policies and algorithms were followed during the patient's care in the ED.  ____________________________________________   FINAL CLINICAL IMPRESSION(S) / ED DIAGNOSES  Empyema   Harvest Dark, MD 01/13/19 1350

## 2019-01-14 ENCOUNTER — Inpatient Hospital Stay: Payer: Medicare Other

## 2019-01-14 DIAGNOSIS — E876 Hypokalemia: Secondary | ICD-10-CM | POA: Diagnosis not present

## 2019-01-14 DIAGNOSIS — K219 Gastro-esophageal reflux disease without esophagitis: Secondary | ICD-10-CM

## 2019-01-14 DIAGNOSIS — J869 Pyothorax without fistula: Secondary | ICD-10-CM | POA: Diagnosis not present

## 2019-01-14 DIAGNOSIS — I10 Essential (primary) hypertension: Secondary | ICD-10-CM | POA: Diagnosis not present

## 2019-01-14 LAB — CREATININE, SERUM
Creatinine, Ser: 0.72 mg/dL (ref 0.61–1.24)
GFR calc Af Amer: >60 mL/min (ref >60)
GFR calc non Af Amer: >60 mL/min (ref >60)

## 2019-01-14 LAB — POTASSIUM: Potassium: 3.2 mmol/L — ABNORMAL LOW (ref 3.5–5.1)

## 2019-01-14 MED ORDER — IOHEXOL 300 MG/ML  SOLN
75.0000 mL | Freq: Once | INTRAMUSCULAR | Status: AC | PRN
  Administered 2019-01-14: 09:00:00 75 mL via INTRAVENOUS

## 2019-01-14 MED ORDER — POTASSIUM CHLORIDE CRYS ER 20 MEQ PO TBCR
40.0000 meq | EXTENDED_RELEASE_TABLET | Freq: Once | ORAL | Status: DC
  Filled 2019-01-14: qty 2

## 2019-01-14 NOTE — Progress Notes (Addendum)
[facility] [place] NAME: Jonathan Burns    MR#:  [id number]  DATE OF BIRTH:  August 01, 1945  SUBJECTIVE:  no new complaints. Denies any pain. Slept good overnight  REVIEW OF SYSTEMS:   Review of Systems  Constitutional: Negative for chills, fever and weight loss.  HENT: Negative for ear discharge, ear pain and nosebleeds.   Eyes: Negative for blurred vision, pain and discharge.  Respiratory: Negative for sputum production, shortness of breath, wheezing and stridor.   Cardiovascular: Negative for chest pain, palpitations, orthopnea and PND.  Gastrointestinal: Negative for abdominal pain, diarrhea, nausea and vomiting.  Genitourinary: Negative for frequency and urgency.  Musculoskeletal: Negative for back pain and joint pain.  Neurological: Negative for sensory change, speech change, focal weakness and weakness.  Psychiatric/Behavioral: Negative for depression and hallucinations. The patient is not nervous/anxious.    Tolerating Diet:yes Tolerating PT: ambulatory  DRUG ALLERGIES:  No Known Allergies  VITALS:  Blood pressure 113/64, pulse 87, temperature 98.4 F (36.9 C), temperature source Oral, resp. rate 20, SpO2 94 %.  PHYSICAL EXAMINATION:   Physical Exam  GENERAL:  73 y.o.-year-old patient lying in the bed with no acute distress.  EYES: Pupils equal, round, reactive to light and accommodation. No scleral icterus. Extraocular muscles intact.  HEENT: Head atraumatic, normocephalic. Oropharynx and nasopharynx clear.  NECK:  Supple, no jugular venous distention. No thyroid enlargement, no tenderness.  LUNGS: Normal breath sounds bilaterally, no wheezing, rales, rhonchi. No use of accessory muscles of respiration.  CARDIOVASCULAR: S1, S2 normal. No murmurs, rubs, or gallops.  ABDOMEN: Soft, nontender, nondistended. Bowel sounds present. No organomegaly or mass.  EXTREMITIES: No cyanosis, clubbing or edema b/l.    NEUROLOGIC: Cranial  nerves II through XII are intact. No focal Motor or sensory deficits b/l.   PSYCHIATRIC:  patient is alert and oriented x 3.  SKIN: No obvious rash, lesion, or ulcer.   LABORATORY PANEL:  CBC Recent Labs  Lab 01/13/19 1208  WBC 21.2*  HGB 13.2  HCT 38.8*  PLT 567*    Chemistries  Recent Labs  Lab 01/13/19 1208 01/14/19 0505  NA 133*  --   K 3.0*  --   CL 93*  --   CO2 26  --   GLUCOSE 110*  --   BUN 27*  --   CREATININE 0.98 0.72  CALCIUM 8.3*  --   AST 34  --   ALT 54*  --   ALKPHOS 151*  --   BILITOT 0.7  --    Cardiac Enzymes No results for input(s): TROPONINI in the last 168 hours. RADIOLOGY:  Ct Abdomen Pelvis W Contrast  Result Date: 01/13/2019 CLINICAL DATA:  73 year old male with history of prostate cancer presenting with leukocytosis. History of prostate cancer. EXAM: CT ABDOMEN AND PELVIS WITH CONTRAST TECHNIQUE: Multidetector CT imaging of the abdomen and pelvis was performed using the standard protocol following bolus administration of intravenous contrast. CONTRAST:  174mL OMNIPAQUE IOHEXOL 300 MG/ML  SOLN COMPARISON:  CT of the abdomen pelvis dated 05/08/2017. FINDINGS: Lower chest: There is a partially visualized loculated appearing moderate-sized left pleural effusion. There is apparent mild enhancement of the adjacent pleural surface. Clinical correlation is recommended to evaluate for possible empyema. There is a rounded loculated component of this fluid within the left lower lobe parenchyma which contains a small pocket of air. The small pockets of air may be related to infectious etiology although a bronchopleural fistula is not excluded. Clinical  correlation is recommended. There is partial compressive atelectasis of the left lower lobe. The visualized right lung base is clear. No intra-abdominal free air or free fluid. Hepatobiliary: There is diffuse fatty infiltration of the liver. There is a 3 cm cyst in the left lobe of the liver. No intrahepatic  biliary ductal dilatation. A 15 mm cyst is also noted in the caudate lobe. There is a stone in the gallbladder. No pericholecystic fluid or evidence of acute cholecystitis. Pancreas: Unremarkable. No pancreatic ductal dilatation or surrounding inflammatory changes. Spleen: Normal in size without focal abnormality. Adrenals/Urinary Tract: The adrenal glands are unremarkable. There is no hydronephrosis on either side. There is symmetric enhancement and excretion of contrast by both kidneys. Subcentimeter bilateral renal hypodense foci are too small to characterize. The visualized ureters appear unremarkable. The urinary bladder is collapsed. There is apparent diffuse thickening of the bladder wall which may be partly related to underdistention. Cystitis is not excluded. Correlation with urinalysis recommended. Stomach/Bowel: There is sigmoid diverticulosis without active inflammatory changes. There is a small hiatal hernia. There is no bowel obstruction or active inflammation. The appendix is normal. Vascular/Lymphatic: Advanced aortoiliac atherosclerotic disease. The IVC is unremarkable. No portal venous gas. There is no adenopathy. Reproductive: The prostate and seminal vesicles are grossly unremarkable. No pelvic mass. Other: Small fat containing bilateral inguinal hernias as well as small fat containing umbilical hernia. Musculoskeletal: Osteopenia with degenerative changes of the spine and multilevel disc desiccation and vacuum phenomena. No acute osseous pathology. IMPRESSION: 1. Partially visualized loculated appearing moderate-sized left pleural effusion with findings concerning for empyema. Clinical correlation is recommended. 2. Fatty liver. 3. Cholelithiasis. 4. Sigmoid diverticulosis. No bowel obstruction or active inflammation. Normal appendix. 5. Aortic Atherosclerosis (ICD10-I70.0). Electronically Signed   By: Anner Crete M.D.   On: 01/13/2019 11:32   ASSESSMENT AND PLAN:  Jonathan Burns  is a 73  y.o. male with a known history of adenocarcinoma of the prostate status post radiation therapy, peptic ulcer disease, COPD, hyperlipidemia, hypertension comes from home with complaints of left flank pain on on and off for week to 10 days.  1. Left pleural effusion ? Empyema -came in with left upper flank lower rib cage area pain for 7 to 10 days. Workup showed elevated white count of 22,000.--21K -denies pian, no fever -CT abdomen reveals partially visualize loculated appearing moderate size left pleural effusion concerning for empyema -General surgery consultation with Dr. Christian Mate appreciated -IV Rocephin and Flagyl-- pharmacy to dose -follow Up blood culture no growth so far -will obtain CT scan of the chest with contrast today  -d/w Dr Kathlene Cote IR to consider US thoracentesis vs CT guide pigtail catheter placement -cont  IV fluids  - creatinine 0.72  2. Hypokalemia suspected due to diuretic -pharmacy to replace orally  3. Hypertension-- controlled -continue hydrochlorothiazide, amlodipine, losartan, Coreg  4. Leukocytosis due to number one  5. History of peptic ulcer disease/Gerd -continue Protonix  6. Adenocarcinoma of the prostate stage III B status post radiation treatment and cancer center -continue Flomax  7. DVT prophylaxis subcu Lovenox  Family Communication: updated patient. Family has been updated by pt Consults: general surgery, IR Code Status: full DVT prophylaxis: Lovenox   TOTAL TIME TAKING CARE OF THIS PATIENT: *30* minutes.  >50% time spent on counselling and coordination of care  POSSIBLE D/C IN *1-3* DAYS, DEPENDING ON CLINICAL CONDITION.  Note: This dictation was prepared with Dragon dictation along with smaller phrase technology. Any transcriptional errors that result from this process  are unintentional.  Fritzi Mandes M.D on 01/14/2019 at 8:53 AM  Between 7am to 6pm - Pager - [phone]  After 6pm go to [email or web address]  [facility]    CC: Primary care physician; Valerie Roys, DOPatient ID: Jonathan Burns, male   DOB: 1945-12-13, 73 y.o.   MRN: [id number]

## 2019-01-14 NOTE — Progress Notes (Signed)
01/14/2019  Subjective: No acute events overnight.  Patient was admitted yesterday with left sided empyema.  This was found on the lung portion of the CT scan of the abdomen and pelvis.  Today he had a dedicated CT scan of the chest which confirmed a left-sided empyema which is loculated.  There is an additional portion of fluid in the upper portion of the lung.  I discussed with interventional radiology today, and since the patient had received subcutaneous heparin this morning, will defer for chest tube placement until tomorrow.  Vital signs: Temp:  [98.4 F (36.9 C)-99.1 F (37.3 C)] 98.5 F (36.9 C) (11/26 1240) Pulse Rate:  [82-97] 92 (11/26 1240) Resp:  [16-20] 20 (11/26 1240) BP: (110-143)/(61-78) 128/61 (11/26 1240) SpO2:  [91 %-95 %] 94 % (11/26 1240)   Intake/Output: 11/25 0701 - 11/26 0700 In: 1051.3 [I.V.:951.3; IV Piggyback:100] Out: -  Last BM Date: 01/12/19  Physical Exam: Constitutional: No acute distress Pulm: No respiratory distress, breathing normally on room air.  Normal breath sounds bilaterally with no wheezing   Labs:  Recent Labs    01/12/19 0823 01/13/19 1208  WBC 22.0* 21.2*  HGB 14.0 13.2  HCT 42.4 38.8*  PLT 610* 567*   Recent Labs    01/12/19 0823 01/13/19 1208 01/14/19 0505  NA 138 133*  --   K 3.7 3.0* 3.2*  CL 95* 93*  --   CO2 24 26  --   GLUCOSE 102* 110*  --   BUN 34* 27*  --   CREATININE 0.99 0.98 0.72  CALCIUM 8.6 8.3*  --    No results for input(s): LABPROT, INR in the last 72 hours.  Imaging: Ct Chest W Contrast  Result Date: 01/14/2019 CLINICAL DATA:  Left flank pain, possible empyema on CT abdomen/pelvis EXAM: CT CHEST WITH CONTRAST TECHNIQUE: Multidetector CT imaging of the chest was performed during intravenous contrast administration. CONTRAST:  53mL OMNIPAQUE IOHEXOL 300 MG/ML  SOLN COMPARISON:  Partial comparison to CT abdomen/pelvis dated 01/13/2019 FINDINGS: Cardiovascular: The heart is normal in size. No  pericardial effusion. No evidence of thoracic aortic aneurysm. Atherosclerotic calcifications of the aortic arch. Coronary atherosclerosis of the LAD. Mediastinum/Nodes: Mildly prominent thoracic lymph nodes, including: --9 mm short axis high right paratracheal node (series 2/image 34) --12 mm short axis prevascular node (series 2/image 58) --13 mm short axis subcarinal node (series 2/image 75) Visualized thyroid is unremarkable. Lungs/Pleura: Moderate left pleural effusion, partially loculated. Mild pleural thickening, favoring a chronic appearance. Additional pleural fluid along the left fissure without thickened rim (series 2/image 52). Dependent atelectasis in the lingula and compressive atelectasis in the left lower lobe. No suspicious pulmonary nodules. Biapical pleural-parenchymal scarring. Mild centrilobular and paraseptal emphysematous changes, upper lung predominant. No pneumothorax. Upper Abdomen: Better evaluated on recent CT. Musculoskeletal: Degenerative changes of the visualized thoracolumbar spine. Mild anterior wedging at T7, T8, and T11, likely chronic. IMPRESSION: Moderate left pleural effusion, partially loculated, with mild pleural thickening. While this may simply reflect a chronic pleural effusion, empyema is not excluded in the appropriate clinical setting (fever/leukocytosis). Mildly prominent thoracic lymph nodes, technically within the upper limits of normal, likely reactive. Aortic Atherosclerosis (ICD10-I70.0) and Emphysema (ICD10-J43.9). Electronically Signed   By: Julian Hy M.D.   On: 01/14/2019 09:23    Assessment/Plan: This is a 73 y.o. male with left-sided empyema.  -Discussed with the patient that we will place a chest tube via interventional radiology tomorrow given that he has subcutaneous heparin this morning.  Once  the tube is in place, then we will be able to initiate TPA infusions through the chest tube in order to help with a loculated empyema.  If this is  successful, then that is all that he would need, however discussed with him that he may still require either a VATS decortication versus thoracotomy.  For the time being, we will give him a regular diet and make him n.p.o. after midnight.  Have modified his heparin orders so that it stops today after the 2 PM dose so that he does not have a dose tonight.   Melvyn Neth, [facility]

## 2019-01-14 NOTE — Consult Note (Addendum)
PHARMACY CONSULT NOTE - FOLLOW UP  Pharmacy Consult for Electrolyte Monitoring and Replacement   Recent Labs: Potassium (mmol/L)  Date Value  01/14/2019 3.2 (L)   Calcium (mg/dL)  Date Value  01/13/2019 8.3 (L)   Albumin (g/dL)  Date Value  01/13/2019 2.5 (L)  01/06/2019 2.9 (L)   Sodium (mmol/L)  Date Value  01/13/2019 133 (L)  01/12/2019 138     Assessment: Pharmacy has been consulted for potassium management.  K 3.2. Hypokalemic. No replacement yet.   Goal of Therapy:  Potassium WNL.   Plan:  1. Will order KCL PO 40 mEq x1 dose.   Rowland Lathe ,PharmD Clinical Pharmacist 01/14/2019 9:51 AM

## 2019-01-15 ENCOUNTER — Inpatient Hospital Stay: Payer: Medicare Other

## 2019-01-15 DIAGNOSIS — E876 Hypokalemia: Secondary | ICD-10-CM | POA: Diagnosis not present

## 2019-01-15 DIAGNOSIS — K219 Gastro-esophageal reflux disease without esophagitis: Secondary | ICD-10-CM | POA: Diagnosis not present

## 2019-01-15 DIAGNOSIS — J869 Pyothorax without fistula: Secondary | ICD-10-CM | POA: Diagnosis not present

## 2019-01-15 DIAGNOSIS — I10 Essential (primary) hypertension: Secondary | ICD-10-CM | POA: Diagnosis not present

## 2019-01-15 LAB — BODY FLUID CELL COUNT WITH DIFFERENTIAL
Eos, Fluid: 0 %
Lymphs, Fluid: 6 %
Monocyte-Macrophage-Serous Fluid: 11 %
Neutrophil Count, Fluid: 83 %
Total Nucleated Cell Count, Fluid: 9581 cu mm

## 2019-01-15 LAB — CBC
HCT: 35.2 % — ABNORMAL LOW (ref 39.0–52.0)
Hemoglobin: 11.8 g/dL — ABNORMAL LOW (ref 13.0–17.0)
MCH: 30.8 pg (ref 26.0–34.0)
MCHC: 33.5 g/dL (ref 30.0–36.0)
MCV: 91.9 fL (ref 80.0–100.0)
Platelets: 475 10*3/uL — ABNORMAL HIGH (ref 150–400)
RBC: 3.83 MIL/uL — ABNORMAL LOW (ref 4.22–5.81)
RDW: 13.7 % (ref 11.5–15.5)
WBC: 17.1 10*3/uL — ABNORMAL HIGH (ref 4.0–10.5)
nRBC: 0 % (ref 0.0–0.2)

## 2019-01-15 LAB — MAGNESIUM
Magnesium: 1.2 mg/dL — ABNORMAL LOW (ref 1.7–2.4)
Magnesium: 1.9 mg/dL (ref 1.7–2.4)

## 2019-01-15 LAB — BASIC METABOLIC PANEL
Anion gap: 10 (ref 5–15)
BUN: 12 mg/dL (ref 8–23)
CO2: 25 mmol/L (ref 22–32)
Calcium: 7.7 mg/dL — ABNORMAL LOW (ref 8.9–10.3)
Chloride: 104 mmol/L (ref 98–111)
Creatinine, Ser: 0.62 mg/dL (ref 0.61–1.24)
GFR calc Af Amer: >60 mL/min (ref >60)
GFR calc non Af Amer: >60 mL/min (ref >60)
Glucose, Bld: 107 mg/dL — ABNORMAL HIGH (ref 70–99)
Potassium: 3.1 mmol/L — ABNORMAL LOW (ref 3.5–5.1)
Sodium: 139 mmol/L (ref 135–145)

## 2019-01-15 LAB — LACTATE DEHYDROGENASE, PLEURAL OR PERITONEAL FLUID: LD, Fluid: 3538 U/L — ABNORMAL HIGH (ref 3–23)

## 2019-01-15 LAB — GLUCOSE, PLEURAL OR PERITONEAL FLUID: Glucose, Fluid: <20 mg/dL

## 2019-01-15 LAB — PROTEIN, PLEURAL OR PERITONEAL FLUID: Total protein, fluid: 4.3 g/dL

## 2019-01-15 MED ORDER — SODIUM CHLORIDE 0.9 % IV SOLN
INTRAVENOUS | Status: DC
  Administered 2019-01-15: 23:00:00 via INTRAVENOUS
  Administered 2019-01-15: 10:00:00 1000 mL via INTRAVENOUS
  Administered 2019-01-18 – 2019-01-20 (×4): via INTRAVENOUS

## 2019-01-15 MED ORDER — MIDAZOLAM HCL 5 MG/5ML IJ SOLN
INTRAMUSCULAR | Status: AC
  Filled 2019-01-15: qty 5

## 2019-01-15 MED ORDER — FENTANYL CITRATE (PF) 100 MCG/2ML IJ SOLN
INTRAMUSCULAR | Status: AC | PRN
  Administered 2019-01-15: 50 ug via INTRAVENOUS

## 2019-01-15 MED ORDER — ENOXAPARIN SODIUM 40 MG/0.4ML ~~LOC~~ SOLN
40.0000 mg | SUBCUTANEOUS | Status: AC
  Administered 2019-01-16 – 2019-01-19 (×4): 40 mg via SUBCUTANEOUS
  Filled 2019-01-15 (×4): qty 0.4

## 2019-01-15 MED ORDER — FENTANYL CITRATE (PF) 100 MCG/2ML IJ SOLN
INTRAMUSCULAR | Status: AC
  Filled 2019-01-15: qty 2

## 2019-01-15 MED ORDER — SODIUM CHLORIDE (PF) 0.9 % IJ SOLN
Freq: Once | INTRAMUSCULAR | Status: AC
  Administered 2019-01-15: 15:00:00 via INTRAPLEURAL
  Filled 2019-01-15: qty 10

## 2019-01-15 MED ORDER — MIDAZOLAM HCL 5 MG/5ML IJ SOLN
INTRAMUSCULAR | Status: AC | PRN
  Administered 2019-01-15: 1 mg via INTRAVENOUS

## 2019-01-15 MED ORDER — POTASSIUM CHLORIDE 10 MEQ/100ML IV SOLN
10.0000 meq | INTRAVENOUS | Status: AC
  Administered 2019-01-15 (×4): 10 meq via INTRAVENOUS
  Filled 2019-01-15 (×3): qty 100

## 2019-01-15 MED ORDER — MAGNESIUM SULFATE 4 GM/100ML IV SOLN
4.0000 g | Freq: Once | INTRAVENOUS | Status: AC
  Administered 2019-01-15: 09:00:00 4 g via INTRAVENOUS
  Filled 2019-01-15: qty 100

## 2019-01-15 NOTE — Procedures (Signed)
Interventional Radiology Procedure:   Indications: Left pleural effusion, concern for empyema.  Procedure: CT guided left chest tube placement  Findings: Amber colored fluid removed, collection appears to be loculated and tube is not draining a large portion of the effusion.  Complications: None     EBL: less than 10 ml  Plan: Follow output, collection will likely require TPA due to loculations.  Fluid sent for analysis.   Sada Mazzoni R. Anselm Pancoast, MD  Pager: [phone]

## 2019-01-15 NOTE — Consult Note (Signed)
Pharmacy Antibiotic Note  Jonathan Burns is a 73 y.o. male admitted on 01/13/2019 with pneumonia and potential intra-abdominal infection.Marland Kitchen  Pharmacy has been consulted for Rocephin and Flagyl dosing. Recent CT from PCP showed left-sided empyema. Was started on PO antibiotics but still complaining of left flank pain. Chest x-ray still concerning for empyema. Will start patient on IV antibiotics in-patient.  Plan: Day 3- Rocephin 1g IV Q24 hours.  Day 3- Metronidazole 500mg  IV Q8 hours.   Height: 5\' 8"  (172.7 cm) Weight: 210 lb (95.3 kg) IBW/kg (Calculated) : 68.4  Temp (24hrs), Avg:98.1 F (36.7 C), Min:97.7 F (36.5 C), Max:98.3 F (36.8 C)  Recent Labs  Lab 01/12/19 0823 01/13/19 1208 01/14/19 0505 01/15/19 0711  WBC 22.0* 21.2*  --  17.1*  CREATININE 0.99 0.98 0.72 0.62    Estimated Creatinine Clearance: 90.8 mL/min (by C-G formula based on SCr of 0.62 mg/dL).    No Known Allergies  Antimicrobials this admission: Ceftriaxone 11/25 >>  Metronidazole 11/25 >>    Microbiology results: 11/25 BCx: pending  Thank you for allowing pharmacy to be a part of this patient's care.  Christophor Eick A 01/15/2019 2:11 PM

## 2019-01-15 NOTE — Progress Notes (Addendum)
Subjective:  CC: Jonathan Burns is a 73 y.o. male  Hospital stay day 2,   lung empyema  HPI: No issues today.  S/p IR guided catheter placement.  Feeling well.  ROS:  General: Denies weight loss, weight gain, fatigue, fevers, chills, and night sweats. Heart: Denies chest pain, palpitations, racing heart, irregular heartbeat, leg pain or swelling, and decreased activity tolerance. Respiratory: Denies breathing difficulty, shortness of breath, wheezing, cough, and sputum. GI: Denies change in appetite, heartburn, nausea, vomiting, constipation, diarrhea, and blood in stool. GU: Denies difficulty urinating, pain with urinating, urgency, frequency, blood in urine.   Objective:   Temp:  [97.7 F (36.5 C)-98.3 F (36.8 C)] 98 F (36.7 C) (11/27 1403) Pulse Rate:  [77-91] 84 (11/27 1403) Resp:  [20-32] 20 (11/27 1403) BP: (114-134)/(58-73) 122/62 (11/27 1403) SpO2:  [91 %-96 %] 92 % (11/27 1403) Weight:  [95.3 kg] 95.3 kg (11/27 0938)     Height: 5\' 8"  (172.7 cm) Weight: 95.3 kg BMI (Calculated): 31.94   Intake/Output this shift:   Intake/Output Summary (Last 24 hours) at 01/15/2019 1741 Last data filed at 01/15/2019 0000 Gross per 24 hour  Intake 1530.29 ml  Output -  Net 1530.29 ml    Constitutional :  alert, cooperative, appears stated age and no distress  Respiratory:  clear to auscultation bilaterally Chest tube catheter with serosanguineous drainage.  Dressing intact.  Cardiovascular:  regular rate and rhythm  Gastrointestinal: soft, non-tender; bowel sounds normal; no masses,  no organomegaly.   Skin: Cool and moist.   Psychiatric: Normal affect, non-agitated, not confused       LABS:  CMP Latest Ref Rng & Units 01/15/2019 01/14/2019 01/13/2019  Glucose 70 - 99 mg/dL 107(H) - 110(H)  BUN 8 - 23 mg/dL 12 - 27(H)  Creatinine 0.61 - 1.24 mg/dL 0.62 0.72 0.98  Sodium 135 - 145 mmol/L 139 - 133(L)  Potassium 3.5 - 5.1 mmol/L 3.1(L) 3.2(L) 3.0(L)  Chloride 98 - 111 mmol/L  104 - 93(L)  CO2 22 - 32 mmol/L 25 - 26  Calcium 8.9 - 10.3 mg/dL 7.7(L) - 8.3(L)  Total Protein 6.5 - 8.1 g/dL - - 7.5  Total Bilirubin 0.3 - 1.2 mg/dL - - 0.7  Alkaline Phos 38 - 126 U/L - - 151(H)  AST 15 - 41 U/L - - 34  ALT 0 - 44 U/L - - 54(H)   CBC Latest Ref Rng & Units 01/15/2019 01/13/2019 01/12/2019  WBC 4.0 - 10.5 K/uL 17.1(H) 21.2(H) 22.0(HH)  Hemoglobin 13.0 - 17.0 g/dL 11.8(L) 13.2 14.0  Hematocrit 39.0 - 52.0 % 35.2(L) 38.8(L) 42.4  Platelets 150 - 400 K/uL 475(H) 567(H) 610(H)    RADS: n/a Assessment:   Lung empyema- s/p catheter placement for drainage.  I personally Administered tPA via catheter tube with no issues.  Keep clamped for 4hrs, then ok to resume on active suction .  Will continue to monitor output and serial cxr for resolution.

## 2019-01-15 NOTE — Care Management Important Message (Signed)
Important Message  Patient Details  Name: Jonathan Burns MRN: [id number] Date of Birth: Oct 16, 1945   Medicare Important Message Given:  Yes  Initial Medicare IM given by Patient Access Associate on 01/15/2019 at 8:55am.    Dannette Barbara 01/15/2019, 11:26 AM

## 2019-01-15 NOTE — Consult Note (Signed)
PHARMACY CONSULT NOTE - FOLLOW UP  Pharmacy Consult for Electrolyte Monitoring and Replacement   Recent Labs: Potassium (mmol/L)  Date Value  01/15/2019 3.1 (L)   Magnesium (mg/dL)  Date Value  01/15/2019 1.2 (L)   Calcium (mg/dL)  Date Value  01/15/2019 7.7 (L)   Albumin (g/dL)  Date Value  01/13/2019 2.5 (L)  01/06/2019 2.9 (L)   Sodium (mmol/L)  Date Value  01/15/2019 139  01/12/2019 138     Assessment: Pharmacy has been consulted for potassium management.  K 3.1  Mag 1.2  Scr 0.62 Patient on Hydrochlorothiazide 25 mg daily  Goal of Therapy:  Potassium WNL.   Plan:  KCL PO not given yesterday 11/26- per [facility] pt NPO. Will order Magnesium 4 gram IV x 1 Will order KCL 10 meq IV x 4  Will f/u Mag and K at 1800 and electrolytes with am labs  Noralee Space ,PharmD Clinical Pharmacist 01/15/2019 7:59 AM

## 2019-01-15 NOTE — Progress Notes (Signed)
[facility] [place] NAME: Jonathan Burns    MR#:  [id number]  DATE OF BIRTH:  1945/11/18  SUBJECTIVE:  no new complaints. Denies any pain. Slept good overnight awaiting chest tube placement  REVIEW OF SYSTEMS:   Review of Systems  Constitutional: Negative for chills, fever and weight loss.  HENT: Negative for ear discharge, ear pain and nosebleeds.   Eyes: Negative for blurred vision, pain and discharge.  Respiratory: Negative for sputum production, shortness of breath, wheezing and stridor.   Cardiovascular: Negative for chest pain, palpitations, orthopnea and PND.  Gastrointestinal: Negative for abdominal pain, diarrhea, nausea and vomiting.  Genitourinary: Negative for frequency and urgency.  Musculoskeletal: Negative for back pain and joint pain.  Neurological: Negative for sensory change, speech change, focal weakness and weakness.  Psychiatric/Behavioral: Negative for depression and hallucinations. The patient is not nervous/anxious.    Tolerating Diet:yes Tolerating PT: ambulatory  DRUG ALLERGIES:  No Known Allergies  VITALS:  Blood pressure (!) 129/58, pulse 86, temperature 97.7 F (36.5 C), temperature source Oral, resp. rate 20, SpO2 93 %.  PHYSICAL EXAMINATION:   Physical Exam  GENERAL:  73 y.o.-year-old patient lying in the bed with no acute distress.  EYES: Pupils equal, round, reactive to light and accommodation. No scleral icterus. Extraocular muscles intact.  HEENT: Head atraumatic, normocephalic. Oropharynx and nasopharynx clear.  NECK:  Supple, no jugular venous distention. No thyroid enlargement, no tenderness.  LUNGS: Normal breath sounds bilaterally, no wheezing, rales, rhonchi. No use of accessory muscles of respiration.  CARDIOVASCULAR: S1, S2 normal. No murmurs, rubs, or gallops.  ABDOMEN: Soft, nontender, nondistended. Bowel sounds present. No organomegaly or mass.  EXTREMITIES: No cyanosis, clubbing or  edema b/l.    NEUROLOGIC: Cranial nerves II through XII are intact. No focal Motor or sensory deficits b/l.   PSYCHIATRIC:  patient is alert and oriented x 3.  SKIN: No obvious rash, lesion, or ulcer.   LABORATORY PANEL:  CBC Recent Labs  Lab 01/15/19 0711  WBC 17.1*  HGB 11.8*  HCT 35.2*  PLT 475*    Chemistries  Recent Labs  Lab 01/13/19 1208  01/15/19 0711  NA 133*  --  139  K 3.0*   < > 3.1*  CL 93*  --  104  CO2 26  --  25  GLUCOSE 110*  --  107*  BUN 27*  --  12  CREATININE 0.98   < > 0.62  CALCIUM 8.3*  --  7.7*  MG  --   --  1.2*  AST 34  --   --   ALT 54*  --   --   ALKPHOS 151*  --   --   BILITOT 0.7  --   --    < > = values in this interval not displayed.   Cardiac Enzymes No results for input(s): TROPONINI in the last 168 hours. RADIOLOGY:  Ct Chest W Contrast  Result Date: 01/14/2019 CLINICAL DATA:  Left flank pain, possible empyema on CT abdomen/pelvis EXAM: CT CHEST WITH CONTRAST TECHNIQUE: Multidetector CT imaging of the chest was performed during intravenous contrast administration. CONTRAST:  98mL OMNIPAQUE IOHEXOL 300 MG/ML  SOLN COMPARISON:  Partial comparison to CT abdomen/pelvis dated 01/13/2019 FINDINGS: Cardiovascular: The heart is normal in size. No pericardial effusion. No evidence of thoracic aortic aneurysm. Atherosclerotic calcifications of the aortic arch. Coronary atherosclerosis of the LAD. Mediastinum/Nodes: Mildly prominent thoracic lymph nodes, including: --9 mm short axis high right  paratracheal node (series 2/image 34) --12 mm short axis prevascular node (series 2/image 58) --13 mm short axis subcarinal node (series 2/image 75) Visualized thyroid is unremarkable. Lungs/Pleura: Moderate left pleural effusion, partially loculated. Mild pleural thickening, favoring a chronic appearance. Additional pleural fluid along the left fissure without thickened rim (series 2/image 52). Dependent atelectasis in the lingula and compressive atelectasis  in the left lower lobe. No suspicious pulmonary nodules. Biapical pleural-parenchymal scarring. Mild centrilobular and paraseptal emphysematous changes, upper lung predominant. No pneumothorax. Upper Abdomen: Better evaluated on recent CT. Musculoskeletal: Degenerative changes of the visualized thoracolumbar spine. Mild anterior wedging at T7, T8, and T11, likely chronic. IMPRESSION: Moderate left pleural effusion, partially loculated, with mild pleural thickening. While this may simply reflect a chronic pleural effusion, empyema is not excluded in the appropriate clinical setting (fever/leukocytosis). Mildly prominent thoracic lymph nodes, technically within the upper limits of normal, likely reactive. Aortic Atherosclerosis (ICD10-I70.0) and Emphysema (ICD10-J43.9). Electronically Signed   By: Julian Hy M.D.   On: 01/14/2019 09:23   Ct Abdomen Pelvis W Contrast  Result Date: 01/13/2019 CLINICAL DATA:  73 year old male with history of prostate cancer presenting with leukocytosis. History of prostate cancer. EXAM: CT ABDOMEN AND PELVIS WITH CONTRAST TECHNIQUE: Multidetector CT imaging of the abdomen and pelvis was performed using the standard protocol following bolus administration of intravenous contrast. CONTRAST:  148mL OMNIPAQUE IOHEXOL 300 MG/ML  SOLN COMPARISON:  CT of the abdomen pelvis dated 05/08/2017. FINDINGS: Lower chest: There is a partially visualized loculated appearing moderate-sized left pleural effusion. There is apparent mild enhancement of the adjacent pleural surface. Clinical correlation is recommended to evaluate for possible empyema. There is a rounded loculated component of this fluid within the left lower lobe parenchyma which contains a small pocket of air. The small pockets of air may be related to infectious etiology although a bronchopleural fistula is not excluded. Clinical correlation is recommended. There is partial compressive atelectasis of the left lower lobe. The  visualized right lung base is clear. No intra-abdominal free air or free fluid. Hepatobiliary: There is diffuse fatty infiltration of the liver. There is a 3 cm cyst in the left lobe of the liver. No intrahepatic biliary ductal dilatation. A 15 mm cyst is also noted in the caudate lobe. There is a stone in the gallbladder. No pericholecystic fluid or evidence of acute cholecystitis. Pancreas: Unremarkable. No pancreatic ductal dilatation or surrounding inflammatory changes. Spleen: Normal in size without focal abnormality. Adrenals/Urinary Tract: The adrenal glands are unremarkable. There is no hydronephrosis on either side. There is symmetric enhancement and excretion of contrast by both kidneys. Subcentimeter bilateral renal hypodense foci are too small to characterize. The visualized ureters appear unremarkable. The urinary bladder is collapsed. There is apparent diffuse thickening of the bladder wall which may be partly related to underdistention. Cystitis is not excluded. Correlation with urinalysis recommended. Stomach/Bowel: There is sigmoid diverticulosis without active inflammatory changes. There is a small hiatal hernia. There is no bowel obstruction or active inflammation. The appendix is normal. Vascular/Lymphatic: Advanced aortoiliac atherosclerotic disease. The IVC is unremarkable. No portal venous gas. There is no adenopathy. Reproductive: The prostate and seminal vesicles are grossly unremarkable. No pelvic mass. Other: Small fat containing bilateral inguinal hernias as well as small fat containing umbilical hernia. Musculoskeletal: Osteopenia with degenerative changes of the spine and multilevel disc desiccation and vacuum phenomena. No acute osseous pathology. IMPRESSION: 1. Partially visualized loculated appearing moderate-sized left pleural effusion with findings concerning for empyema. Clinical correlation is recommended. 2.  Fatty liver. 3. Cholelithiasis. 4. Sigmoid diverticulosis. No bowel  obstruction or active inflammation. Normal appendix. 5. Aortic Atherosclerosis (ICD10-I70.0). Electronically Signed   By: Anner Crete M.D.   On: 01/13/2019 11:32   ASSESSMENT AND PLAN:  Jonathan Burns  is a 73 y.o. male with a known history of adenocarcinoma of the prostate status post radiation therapy, peptic ulcer disease, COPD, hyperlipidemia, hypertension comes from home with complaints of left flank pain on on and off for week to 10 days.  1. Left pleural effusion ? Empyema -came in with left upper flank lower rib cage area pain for 7 to 10 days. Workup showed elevated white count of 22,000.--21K--17K -denies pain, no fever -CT abdomen reveals partially visualize loculated appearing moderate size left pleural effusion concerning for empyema CT chest moderate left Pleural effusion, partially loculated with mild pleural thickening -General surgery consultation with Dr. Lydia Guiles appreciated -IV Rocephin and Flagyl-- pharmacy to dose -follow Up blood culture no growth so far -Chest tube/pigtail cath  placement today -received   IV fluids  - creatinine 0.72--0.62  2. Hypokalemia suspected due to diuretic -pharmacy to replace orally  3. Hypertension-- controlled -continue  amlodipine, losartan, Coreg  4. Leukocytosis due to number one  5. History of peptic ulcer disease/Gerd -continue Protonix  6. Adenocarcinoma of the prostate stage III B status post radiation treatment and cancer center -continue Flomax  7. DVT prophylaxis  Resume heparin after CT placement  Family Communication: updated patient. Family has been updated by pt Consults: general surgery, IR Code Status: full DVT prophylaxis: heparin on hold due to procedure   TOTAL TIME TAKING CARE OF THIS PATIENT: *30* minutes.  >50% time spent on counselling and coordination of care  POSSIBLE D/C IN *1-3* DAYS, DEPENDING ON CLINICAL CONDITION.  Note: This dictation was prepared with Dragon dictation  along with smaller phrase technology. Any transcriptional errors that result from this process are unintentional.  Fritzi Mandes M.D on 01/15/2019 at 9:26 AM  Between 7am to 6pm - Pager - [phone]  After 6pm go to [email or web address]  [facility]   CC: Primary care physician; Valerie Roys, DOPatient ID: Jonathan Burns, male   DOB: [birth year], 73 y.o.   MRN: [id number]

## 2019-01-15 NOTE — Consult Note (Signed)
PHARMACY CONSULT NOTE - FOLLOW UP  Pharmacy Consult for Electrolyte Monitoring and Replacement   Recent Labs: Potassium (mmol/L)  Date Value  01/15/2019 3.1 (L)   Magnesium (mg/dL)  Date Value  01/15/2019 1.9   Calcium (mg/dL)  Date Value  01/15/2019 7.7 (L)   Albumin (g/dL)  Date Value  01/13/2019 2.5 (L)  01/06/2019 2.9 (L)   Sodium (mmol/L)  Date Value  01/15/2019 139  01/12/2019 138     Assessment: Pharmacy has been consulted for potassium management.  K 3.1  Mag 1.2  Scr 0.62 Patient on Hydrochlorothiazide 25 mg daily  Goal of Therapy:  Potassium WNL.   Plan:  KCL PO not given yesterday 11/26- per [facility] pt NPO. Will order Magnesium 4 gram IV x 1 Will order KCL 10 meq IV x 4  Will f/u Mag and K at 1800 and electrolytes with am labs  11/27@1800 :  Mg 1.9 Currently WNL. Will defer further replacement at this time and recheck electrolytes with AM labs.  Pearla Dubonnet ,PharmD Clinical Pharmacist 01/15/2019 7:01 PM

## 2019-01-15 NOTE — Progress Notes (Signed)
Patient ID: Jonathan Burns, male   DOB: 1945-11-07, 73 y.o.   MRN: [id number] Patient referred for CT guided left chest tube placement.  Recent Chest CT demonstrates a complex left pleural effusion and concern for empyema.  Exam: No acute distress Decreased breath sound at left lung base. Heart- RRR Mallampati - 2  Assessment:  73 yo with left pleural effusion and concern for empyema.  Request for CT guided chest tube placement.  Plan:  Reviewed CT and pleural effusion is amendable to drainage.  Plan for CT guided drainage with moderate sedation.    Risks and benefits discussed with the patient including bleeding, infection, damage to adjacent structures, bowel perforation/fistula connection, and sepsis.  All of the patient's questions were answered, patient is agreeable to proceed. Consent signed and in chart.

## 2019-01-16 ENCOUNTER — Inpatient Hospital Stay: Payer: Medicare Other

## 2019-01-16 DIAGNOSIS — E876 Hypokalemia: Secondary | ICD-10-CM | POA: Diagnosis not present

## 2019-01-16 DIAGNOSIS — J869 Pyothorax without fistula: Secondary | ICD-10-CM | POA: Diagnosis not present

## 2019-01-16 LAB — MAGNESIUM: Magnesium: 1.7 mg/dL (ref 1.7–2.4)

## 2019-01-16 LAB — CBC WITH DIFFERENTIAL/PLATELET
Abs Immature Granulocytes: 0.18 10*3/uL — ABNORMAL HIGH (ref 0.00–0.07)
Basophils Absolute: 0 10*3/uL (ref 0.0–0.1)
Basophils Relative: 0 %
Eosinophils Absolute: 0.1 10*3/uL (ref 0.0–0.5)
Eosinophils Relative: 1 %
HCT: 36.5 % — ABNORMAL LOW (ref 39.0–52.0)
Hemoglobin: 12 g/dL — ABNORMAL LOW (ref 13.0–17.0)
Immature Granulocytes: 1 %
Lymphocytes Relative: 8 %
Lymphs Abs: 1.2 10*3/uL (ref 0.7–4.0)
MCH: 30.5 pg (ref 26.0–34.0)
MCHC: 32.9 g/dL (ref 30.0–36.0)
MCV: 92.9 fL (ref 80.0–100.0)
Monocytes Absolute: 1 10*3/uL (ref 0.1–1.0)
Monocytes Relative: 7 %
Neutro Abs: 11.6 10*3/uL — ABNORMAL HIGH (ref 1.7–7.7)
Neutrophils Relative %: 83 %
Platelets: 469 10*3/uL — ABNORMAL HIGH (ref 150–400)
RBC: 3.93 MIL/uL — ABNORMAL LOW (ref 4.22–5.81)
RDW: 13.8 % (ref 11.5–15.5)
WBC: 14 10*3/uL — ABNORMAL HIGH (ref 4.0–10.5)
nRBC: 0 % (ref 0.0–0.2)

## 2019-01-16 LAB — BASIC METABOLIC PANEL
Anion gap: 10 (ref 5–15)
BUN: 13 mg/dL (ref 8–23)
CO2: 24 mmol/L (ref 22–32)
Calcium: 7.8 mg/dL — ABNORMAL LOW (ref 8.9–10.3)
Chloride: 102 mmol/L (ref 98–111)
Creatinine, Ser: 0.83 mg/dL (ref 0.61–1.24)
GFR calc Af Amer: >60 mL/min (ref >60)
GFR calc non Af Amer: >60 mL/min (ref >60)
Glucose, Bld: 105 mg/dL — ABNORMAL HIGH (ref 70–99)
Potassium: 3.4 mmol/L — ABNORMAL LOW (ref 3.5–5.1)
Sodium: 136 mmol/L (ref 135–145)

## 2019-01-16 LAB — PHOSPHORUS: Phosphorus: 2.8 mg/dL (ref 2.5–4.6)

## 2019-01-16 MED ORDER — MAGNESIUM SULFATE 2 GM/50ML IV SOLN
2.0000 g | Freq: Once | INTRAVENOUS | Status: AC
  Administered 2019-01-16: 09:00:00 2 g via INTRAVENOUS
  Filled 2019-01-16: qty 50

## 2019-01-16 MED ORDER — SODIUM CHLORIDE (PF) 0.9 % IJ SOLN
Freq: Once | INTRAMUSCULAR | Status: AC
  Administered 2019-01-16: 14:00:00 via INTRAPLEURAL
  Filled 2019-01-16: qty 10

## 2019-01-16 MED ORDER — POTASSIUM CHLORIDE CRYS ER 20 MEQ PO TBCR
20.0000 meq | EXTENDED_RELEASE_TABLET | Freq: Once | ORAL | Status: AC
  Administered 2019-01-16: 20 meq via ORAL
  Filled 2019-01-16: qty 1

## 2019-01-16 MED ORDER — CALCIUM CARBONATE ANTACID 500 MG PO CHEW
1.0000 | CHEWABLE_TABLET | Freq: Two times a day (BID) | ORAL | Status: DC
  Administered 2019-01-16 – 2019-01-28 (×24): 200 mg via ORAL
  Filled 2019-01-16 (×25): qty 1

## 2019-01-16 NOTE — Progress Notes (Signed)
Subjective:  CC: Jonathan Burns is a 73 y.o. male  Hospital stay day 3,   lung empyema  HPI: No issues today.  Continues to feel well.  ROS:  General: Denies weight loss, weight gain, fatigue, fevers, chills, and night sweats. Heart: Denies chest pain, palpitations, racing heart, irregular heartbeat, leg pain or swelling, and decreased activity tolerance. Respiratory: Denies breathing difficulty, shortness of breath, wheezing, cough, and sputum. GI: Denies change in appetite, heartburn, nausea, vomiting, constipation, diarrhea, and blood in stool. GU: Denies difficulty urinating, pain with urinating, urgency, frequency, blood in urine.   Objective:   Temp:  [98 F (36.7 C)-98.2 F (36.8 C)] 98.2 F (36.8 C) (11/28 0900) Pulse Rate:  [84-91] 88 (11/28 0900) Resp:  [17-28] 17 (11/28 0446) BP: (121-132)/(59-67) 128/61 (11/28 0900) SpO2:  [91 %-95 %] 93 % (11/28 0900)     Height: 5\' 8"  (172.7 cm) Weight: 95.3 kg BMI (Calculated): 31.94   Intake/Output this shift:   Intake/Output Summary (Last 24 hours) at 01/16/2019 1057 Last data filed at 01/16/2019 0540 Gross per 24 hour  Intake 600 ml  Output 1210 ml  Net -610 ml    Constitutional :  alert, cooperative, appears stated age and no distress  Respiratory:  clear to auscultation bilaterally.  Chest tube catheter with serosanguineous drainage.  Dressing intact.  Cardiovascular:  regular rate and rhythm  Gastrointestinal: soft, non-tender; bowel sounds normal; no masses,  no organomegaly.   Skin: Cool and moist.   Psychiatric: Normal affect, non-agitated, not confused       LABS:  CMP Latest Ref Rng & Units 01/16/2019 01/15/2019 01/14/2019  Glucose 70 - 99 mg/dL 105(H) 107(H) -  BUN 8 - 23 mg/dL 13 12 -  Creatinine 0.61 - 1.24 mg/dL 0.83 0.62 0.72  Sodium 135 - 145 mmol/L 136 139 -  Potassium 3.5 - 5.1 mmol/L 3.4(L) 3.1(L) 3.2(L)  Chloride 98 - 111 mmol/L 102 104 -  CO2 22 - 32 mmol/L 24 25 -  Calcium 8.9 - 10.3 mg/dL  7.8(L) 7.7(L) -  Total Protein 6.5 - 8.1 g/dL - - -  Total Bilirubin 0.3 - 1.2 mg/dL - - -  Alkaline Phos 38 - 126 U/L - - -  AST 15 - 41 U/L - - -  ALT 0 - 44 U/L - - -   CBC Latest Ref Rng & Units 01/16/2019 01/15/2019 01/13/2019  WBC 4.0 - 10.5 K/uL 14.0(H) 17.1(H) 21.2(H)  Hemoglobin 13.0 - 17.0 g/dL 12.0(L) 11.8(L) 13.2  Hematocrit 39.0 - 52.0 % 36.5(L) 35.2(L) 38.8(L)  Platelets 150 - 400 K/uL 469(H) 475(H) 567(H)    RADS: CLINICAL DATA:  Empyema of lung.  EXAM: CHEST  1 VIEW  COMPARISON:  Chest CT dated 01/14/2019.  FINDINGS: Interval placement of a LEFT-sided chest tube, coiled at the lateral aspect of the LEFT mid lung. Significant improvement in aeration at the LEFT lung base. Persistent streaky opacities within the LEFT perihilar lung and at the LEFT lung base, presumably atelectasis. RIGHT lung is clear. No pneumothorax seen. Heart size and mediastinal contours are within normal limits.  IMPRESSION: 1. Interval placement of a LEFT-sided chest tube, coiled at the lateral aspect of the LEFT mid lung. Significant improvement in aeration at the LEFT lung base. 2. Probable atelectasis within the LEFT lung.   Electronically Signed   By: Franki Cabot M.D.   On: 01/16/2019 07:53 Assessment:   Lung empyema- s/p catheter placement for drainage, TPA administration yesterday.  Improving.  I personally  Administered tPA via catheter tube with no issues again today to further encourage drainage of the still present fluid on chest x-ray.Marland Kitchen  Keep clamped for 4hrs, then ok to resume on active suction .  Will continue to monitor output and serial cxr for resolution.

## 2019-01-16 NOTE — Progress Notes (Signed)
[facility] NAME: Jonathan Burns    MR#:  [id number]  DATE OF BIRTH:  1945/06/23  SUBJECTIVE:  no new complaints. Denies any pain. Slept good overnight S/p  chest tube placement day 1  Daughter at bedside  REVIEW OF SYSTEMS:   Review of Systems  Constitutional: Negative for chills, fever and weight loss.  HENT: Negative for ear discharge, ear pain and nosebleeds.   Eyes: Negative for blurred vision, pain and discharge.  Respiratory: Negative for sputum production, shortness of breath, wheezing and stridor.   Cardiovascular: Negative for chest pain, palpitations, orthopnea and PND.  Gastrointestinal: Negative for abdominal pain, diarrhea, nausea and vomiting.  Genitourinary: Negative for frequency and urgency.  Musculoskeletal: Negative for back pain and joint pain.  Neurological: Negative for sensory change, speech change, focal weakness and weakness.  Psychiatric/Behavioral: Negative for depression and hallucinations. The patient is not nervous/anxious.    Tolerating Diet:yes Tolerating PT: ambulatory  DRUG ALLERGIES:  No Known Allergies  VITALS:  Blood pressure (!) 110/58, pulse 75, temperature 98.3 F (36.8 C), temperature source Oral, resp. rate 18, height 5\' 8"  (1.727 m), weight 95.3 kg, SpO2 91 %.  PHYSICAL EXAMINATION:   Physical Exam  GENERAL:  73 y.o.-year-old patient lying in the bed with no acute distress.  EYES: Pupils equal, round, reactive to light and accommodation. No scleral icterus. Extraocular muscles intact.  HEENT: Head atraumatic, normocephalic. Oropharynx and nasopharynx clear.  NECK:  Supple, no jugular venous distention. No thyroid enlargement, no tenderness.  LUNGS: Normal breath sounds bilaterally, no wheezing, rales, rhonchi. No use of accessory muscles of respiration. Left side CT placement CARDIOVASCULAR: S1, S2 normal. No murmurs, rubs, or gallops.  ABDOMEN: Soft, nontender, nondistended.  Bowel sounds present. No organomegaly or mass.  EXTREMITIES: No cyanosis, clubbing or edema b/l.    NEUROLOGIC: Cranial nerves II through XII are intact. No focal Motor or sensory deficits b/l.   PSYCHIATRIC:  patient is alert and oriented x 3.  SKIN: No obvious rash, lesion, or ulcer.   LABORATORY PANEL:  CBC Recent Labs  Lab 01/16/19 0516  WBC 14.0*  HGB 12.0*  HCT 36.5*  PLT 469*    Chemistries  Recent Labs  Lab 01/13/19 1208  01/16/19 0516  NA 133*   < > 136  K 3.0*   < > 3.4*  CL 93*   < > 102  CO2 26   < > 24  GLUCOSE 110*   < > 105*  BUN 27*   < > 13  CREATININE 0.98   < > 0.83  CALCIUM 8.3*   < > 7.8*  MG  --    < > 1.7  AST 34  --   --   ALT 54*  --   --   ALKPHOS 151*  --   --   BILITOT 0.7  --   --    < > = values in this interval not displayed.   Cardiac Enzymes No results for input(s): TROPONINI in the last 168 hours. RADIOLOGY:  Dg Chest 1 View  Result Date: 01/16/2019 CLINICAL DATA:  Empyema of lung. EXAM: CHEST  1 VIEW COMPARISON:  Chest CT dated 01/14/2019. FINDINGS: Interval placement of a LEFT-sided chest tube, coiled at the lateral aspect of the LEFT mid lung. Significant improvement in aeration at the LEFT lung base. Persistent streaky opacities within the LEFT perihilar lung and at the LEFT lung base, presumably atelectasis. RIGHT lung is  clear. No pneumothorax seen. Heart size and mediastinal contours are within normal limits. IMPRESSION: 1. Interval placement of a LEFT-sided chest tube, coiled at the lateral aspect of the LEFT mid lung. Significant improvement in aeration at the LEFT lung base. 2. Probable atelectasis within the LEFT lung. Electronically Signed   By: Franki Cabot M.D.   On: 01/16/2019 07:53   Ct Image Guided Drainage By Percutaneous Catheter  Result Date: 01/15/2019 INDICATION: 73 year old with loculated left pleural effusion. Request for CT-guided chest tube placement. EXAM: CT GUIDED PLACEMENT OF LEFT CHEST TUBE MEDICATIONS:  No antibiotics were given for this procedure. ANESTHESIA/SEDATION: 1.0 mg IV Versed 50 mcg IV Fentanyl Moderate Sedation Time:  35 minutes The patient was continuously monitored during the procedure by the interventional radiology nurse under my direct supervision. COMPLICATIONS: None immediate. TECHNIQUE: Informed written consent was obtained from the patient after a thorough discussion of the procedural risks, benefits and alternatives. All questions were addressed. A timeout was performed prior to the initiation of the procedure. PROCEDURE: Patient was placed supine and CT images through the chest were obtained. The complex left pleural effusion was identified. The left mid axillary region was prepped with chlorhexidine and sterile field was created. Skin and soft tissues were anesthetized with 1% lidocaine. Yueh catheter was directed into the left pleural space with CT guidance. Amber colored fluid was aspirated. A stiff Amplatz wire was advanced into the pleural space. The tract was dilated to accommodate a 14 [place] multipurpose drain. Approximately 50 mL of amber colored fluid was immediately removed and the fluid was sent for analysis. Catheter was sutured to skin and attached to a PleurEvac. Small amount of additional fluid came out of the chest tube while on suction. Catheter was secured to skin with a StatLock and a dressing was placed. FINDINGS: Complex left pleural effusion. Pleural catheter was placed in the lateral aspect of the collection. Collection appears to be loculated because a large amount of the pleural effusion was not draining after chest tube placement. IMPRESSION: CT-guided placement of a chest tube in the loculated left pleural effusion. Despite placement within the left pleural space, the pleural fluid was not draining well and compatible with multiple loculations. Electronically Signed   By: Markus Daft M.D.   On: 01/15/2019 11:45   ASSESSMENT AND PLAN:  Jonathan Burns  is a 73 y.o. male  with a known history of adenocarcinoma of the prostate status post radiation therapy, peptic ulcer disease, COPD, hyperlipidemia, hypertension comes from home with complaints of left flank pain on on and off for week to 10 days.  1. Left pleural effusion/Empyema -came in with left upper flank lower rib cage area pain for 7 to 10 days. Workup showed elevated white count of 22,000.--21K--17K--14K -CT abdomen reveals partially visualize loculated appearing moderate size left pleural effusion concerning for empyema CT chest moderate left Pleural effusion, partially loculated with mild pleural thickening -General surgery consultation with Dr. Juanita Laster appreciated -pt got TPA injected x2 by Dr Lysle Pearl, f/u daily xrays -IV Rocephin and Flagyl-- pharmacy to dose--change to oral at d/c -follow Up blood culture no growth so far -Pleural fluid culture no growth -s/p Chest tube/pigtail cath  placement  By IR on 01/15/2019.  Pleural fluid --exudative - creatinine 0.72--0.62  2. Hypokalemia suspected due to diuretic -pharmacy to replace orally  3. Hypertension-- controlled -continue  amlodipine, losartan, Coreg  4. Leukocytosis due to number one  5. History of peptic ulcer disease/Gerd -continue Protonix  6. Adenocarcinoma of the  prostate stage III B status post radiation treatment and cancer center -continue Flomax  7. DVT prophylaxis :lovenox  Family Communication: updated patient and der in the room Consults: general surgery, IR Code Status: full DVT prophylaxis: lovenox  TOTAL TIME TAKING CARE OF THIS PATIENT: *30* minutes.  >50% time spent on counselling and coordination of care  POSSIBLE D/C IN *1-3* DAYS, DEPENDING ON CLINICAL CONDITION.  Note: This dictation was prepared with Dragon dictation along with smaller phrase technology. Any transcriptional errors that result from this process are unintentional.  Fritzi Mandes M.D on 01/16/2019 at 1:15 PM  Between 7am  to 6pm - Pager - [phone]  After 6pm go to [email or web address]  [facility]   CC: Primary care physician; Valerie Roys, DOPatient ID: Jonathan Burns, male   DOB: 1945/08/26, 73 y.o.   MRN: [id number]

## 2019-01-16 NOTE — Consult Note (Signed)
PHARMACY CONSULT NOTE - FOLLOW UP  Pharmacy Consult for Electrolyte Monitoring and Replacement   Recent Labs: Potassium (mmol/L)  Date Value  01/16/2019 3.4 (L)   Magnesium (mg/dL)  Date Value  01/16/2019 1.7   Calcium (mg/dL)  Date Value  01/16/2019 7.8 (L)   Albumin (g/dL)  Date Value  01/13/2019 2.5 (L)  01/06/2019 2.9 (L)   Phosphorus (mg/dL)  Date Value  01/16/2019 2.8   Sodium (mmol/L)  Date Value  01/16/2019 136  01/12/2019 138     Assessment: Pharmacy has been consulted for potassium management.  K 3.4  Mag 1.7  Scr 0.83 Patient on Hydrochlorothiazide 25 mg daily  Goal of Therapy:  Potassium WNL   Plan:  Will order Magnesium 2 gram IV x 1 Will order KCL 20 meq PO x 1 Will f/u electrolytes with am labs   Noralee Space ,PharmD Clinical Pharmacist 01/16/2019 7:47 AM

## 2019-01-17 ENCOUNTER — Encounter: Payer: Self-pay | Admitting: Internal Medicine

## 2019-01-17 ENCOUNTER — Other Ambulatory Visit: Payer: Self-pay

## 2019-01-17 ENCOUNTER — Inpatient Hospital Stay: Payer: Medicare Other

## 2019-01-17 DIAGNOSIS — E876 Hypokalemia: Secondary | ICD-10-CM | POA: Diagnosis not present

## 2019-01-17 DIAGNOSIS — J869 Pyothorax without fistula: Secondary | ICD-10-CM | POA: Diagnosis not present

## 2019-01-17 LAB — CBC WITH DIFFERENTIAL/PLATELET
Abs Immature Granulocytes: 0.21 10*3/uL — ABNORMAL HIGH (ref 0.00–0.07)
Basophils Absolute: 0 10*3/uL (ref 0.0–0.1)
Basophils Relative: 0 %
Eosinophils Absolute: 0.1 10*3/uL (ref 0.0–0.5)
Eosinophils Relative: 0 %
HCT: 35.5 % — ABNORMAL LOW (ref 39.0–52.0)
Hemoglobin: 12.3 g/dL — ABNORMAL LOW (ref 13.0–17.0)
Immature Granulocytes: 1 %
Lymphocytes Relative: 9 %
Lymphs Abs: 1.4 10*3/uL (ref 0.7–4.0)
MCH: 30.9 pg (ref 26.0–34.0)
MCHC: 34.6 g/dL (ref 30.0–36.0)
MCV: 89.2 fL (ref 80.0–100.0)
Monocytes Absolute: 1 10*3/uL (ref 0.1–1.0)
Monocytes Relative: 6 %
Neutro Abs: 13.3 10*3/uL — ABNORMAL HIGH (ref 1.7–7.7)
Neutrophils Relative %: 84 %
Platelets: 477 10*3/uL — ABNORMAL HIGH (ref 150–400)
RBC: 3.98 MIL/uL — ABNORMAL LOW (ref 4.22–5.81)
RDW: 13.9 % (ref 11.5–15.5)
Smear Review: NORMAL
WBC: 16.1 10*3/uL — ABNORMAL HIGH (ref 4.0–10.5)
nRBC: 0 % (ref 0.0–0.2)

## 2019-01-17 LAB — BASIC METABOLIC PANEL
Anion gap: 13 (ref 5–15)
BUN: 14 mg/dL (ref 8–23)
CO2: 20 mmol/L — ABNORMAL LOW (ref 22–32)
Calcium: 7.9 mg/dL — ABNORMAL LOW (ref 8.9–10.3)
Chloride: 104 mmol/L (ref 98–111)
Creatinine, Ser: 0.74 mg/dL (ref 0.61–1.24)
GFR calc Af Amer: >60 mL/min (ref >60)
GFR calc non Af Amer: >60 mL/min (ref >60)
Glucose, Bld: 95 mg/dL (ref 70–99)
Potassium: 3.8 mmol/L (ref 3.5–5.1)
Sodium: 137 mmol/L (ref 135–145)

## 2019-01-17 LAB — MAGNESIUM: Magnesium: 1.7 mg/dL (ref 1.7–2.4)

## 2019-01-17 MED ORDER — ALUM & MAG HYDROXIDE-SIMETH 200-200-20 MG/5ML PO SUSP
30.0000 mL | ORAL | Status: DC | PRN
  Administered 2019-01-17 – 2019-01-25 (×5): 30 mL via ORAL
  Filled 2019-01-17 (×5): qty 30

## 2019-01-17 MED ORDER — SODIUM CHLORIDE (PF) 0.9 % IJ SOLN
Freq: Once | INTRAMUSCULAR | Status: AC
  Administered 2019-01-17: 13:00:00 via INTRAPLEURAL
  Filled 2019-01-17: qty 10

## 2019-01-17 MED ORDER — MAGNESIUM SULFATE 2 GM/50ML IV SOLN
2.0000 g | Freq: Once | INTRAVENOUS | Status: AC
  Administered 2019-01-17: 09:00:00 2 g via INTRAVENOUS
  Filled 2019-01-17: qty 50

## 2019-01-17 MED ORDER — SODIUM CHLORIDE 0.9 % IV SOLN
INTRAVENOUS | Status: DC | PRN
  Administered 2019-01-17: 15:00:00 10 mL via INTRAVENOUS
  Administered 2019-01-26 – 2019-01-27 (×2): 1000 mL via INTRAVENOUS

## 2019-01-17 NOTE — Progress Notes (Addendum)
Progress Note    Jonathan Burns  HYW:[id number] DOB: [birth year]  DOA: 01/13/2019 PCP: Valerie Roys, DO      Brief Narrative:    Medical records reviewed and are as summarized below:  Jonathan Burns is an 73 y.o. male with history of adenocarcinoma of the prostate status post radiation therapy, peptic ulcer disease, COPD, hyperlipidemia, hypertension, presented to the hospital with intermittent left flank pain OF about 7 to 10 days duration.  He was found to have left-sided empyema requiring drainage via chest tube      Assessment/Plan:   Active Problems:   Empyema of left pleural space (HCC)   Hypokalemia   Body mass index is 31.93 kg/m.   Left pleural effusion/empyema: s/p TPA on 11/27 and 11/28. S/p chest tube placement on 01/15/2019.  Continue IV Rocephin and Flagyl.  Leukocytosis: This is likely due to empyema.  Improving.  Hypertension: Continue antihypertensives  Hypokalemia: Improved.   GERD/history of peptic ulcer disease: Continue Protonix  History of adenocarcinoma of the prostate stage III B: post radiation therapy: Continue Flomax       Family Communication/Anticipated D/C date and plan/Code Status   DVT prophylaxis: Lovenox Code Status: Full code Family Communication: Plan discussed with the patient Disposition Plan: Unknown at this time      Subjective:   No shortness of breath, cough, chest pain, fever or chills.  He feels better.  Objective:    Vitals:   01/16/19 1251 01/16/19 2023 01/17/19 0515 01/17/19 0830  BP: (!) 110/58 109/62 134/67 127/69  Pulse: 75 76 92 94  Resp: 18 18 16 16   Temp: 98.3 F (36.8 C) 98.2 F (36.8 C) 98.3 F (36.8 C) (!) 97.5 F (36.4 C)  TempSrc: Oral Oral Oral Oral  SpO2: 91% 93% 91% 93%  Weight:      Height:        Intake/Output Summary (Last 24 hours) at 01/17/2019 1122 Last data filed at 01/16/2019 1811 Gross per 24 hour  Intake 1076.3 ml  Output -  Net 1076.3 ml   Filed Weights    01/15/19 0938  Weight: 95.3 kg    Exam:  GEN: NAD SKIN: No rash EYES: EOMI, no pallor or icterus ENT: MMM CV: RRR PULM: CTA B. Chest tube on left lateral chest wall connected to the chest tube drainage system ABD: soft, obese, NT, +BS CNS: AAO x 3, non focal EXT: No edema or tenderness   Data Reviewed:   I have personally reviewed following labs and imaging studies:  Labs: Labs show the following:   Basic Metabolic Panel: Recent Labs  Lab 01/12/19 0823 01/13/19 1208 01/14/19 0505 01/15/19 0711 01/15/19 1819 01/16/19 0516 01/17/19 0540  NA 138 133*  --  139  --  136 137  K 3.7 3.0* 3.2* 3.1*  --  3.4* 3.8  CL 95* 93*  --  104  --  102 104  CO2 24 26  --  25  --  24 20*  GLUCOSE 102* 110*  --  107*  --  105* 95  BUN 34* 27*  --  12  --  13 14  CREATININE 0.99 0.98 0.72 0.62  --  0.83 0.74  CALCIUM 8.6 8.3*  --  7.7*  --  7.8* 7.9*  MG  --   --   --  1.2* 1.9 1.7 1.7  PHOS  --   --   --   --   --  2.8  --  GFR Estimated Creatinine Clearance: 90.8 mL/min (by C-G formula based on SCr of 0.74 mg/dL). Liver Function Tests: Recent Labs  Lab 01/13/19 1208  AST 34  ALT 54*  ALKPHOS 151*  BILITOT 0.7  PROT 7.5  ALBUMIN 2.5*   No results for input(s): LIPASE, AMYLASE in the last 168 hours. No results for input(s): AMMONIA in the last 168 hours. Coagulation profile No results for input(s): INR, PROTIME in the last 168 hours.  CBC: Recent Labs  Lab 01/12/19 0823 01/13/19 1208 01/15/19 0711 01/16/19 0516 01/17/19 0540  WBC 22.0* 21.2* 17.1* 14.0* 16.1*  NEUTROABS 19.0* 18.2*  --  11.6* 13.3*  HGB 14.0 13.2 11.8* 12.0* 12.3*  HCT 42.4 38.8* 35.2* 36.5* 35.5*  MCV 95 91.9 91.9 92.9 89.2  PLT 610* 567* 475* 469* 477*   Cardiac Enzymes: No results for input(s): CKTOTAL, CKMB, CKMBINDEX, TROPONINI in the last 168 hours. BNP (last 3 results) No results for input(s): PROBNP in the last 8760 hours. CBG: No results for input(s): GLUCAP in the last 168  hours. D-Dimer: No results for input(s): DDIMER in the last 72 hours. Hgb A1c: No results for input(s): HGBA1C in the last 72 hours. Lipid Profile: No results for input(s): CHOL, HDL, LDLCALC, TRIG, CHOLHDL, LDLDIRECT in the last 72 hours. Thyroid function studies: No results for input(s): TSH, T4TOTAL, T3FREE, THYROIDAB in the last 72 hours.  Invalid input(s): FREET3 Anemia work up: No results for input(s): VITAMINB12, FOLATE, FERRITIN, TIBC, IRON, RETICCTPCT in the last 72 hours. Sepsis Labs: Recent Labs  Lab 01/13/19 1208 01/15/19 0711 01/16/19 0516 01/17/19 0540  WBC 21.2* 17.1* 14.0* 16.1*    Microbiology Recent Results (from the past 240 hour(s))  Blood culture (routine x 2)     Status: None (Preliminary result)   Collection Time: 01/13/19 12:08 PM   Specimen: BLOOD  Result Value Ref Range Status   Specimen Description BLOOD LEFT ANTECUBITAL  Final   Special Requests   Final    BOTTLES DRAWN AEROBIC AND ANAEROBIC Blood Culture adequate volume   Culture   Final    NO GROWTH 4 DAYS Performed at [facility], [address] [place], [place] [address]    Report Status PENDING  Incomplete  Blood culture (routine x 2)     Status: None (Preliminary result)   Collection Time: 01/13/19 12:09 PM   Specimen: BLOOD  Result Value Ref Range Status   Specimen Description BLOOD RIGHT ANTECUBITAL  Final   Special Requests   Final    BOTTLES DRAWN AEROBIC AND ANAEROBIC Blood Culture adequate volume   Culture   Final    NO GROWTH 4 DAYS Performed at [facility], [address] [place], [place] [address]    Report Status PENDING  Incomplete  SARS CORONAVIRUS 2 (TAT 6-24 HRS) Nasopharyngeal Nasopharyngeal Swab     Status: None   Collection Time: 01/13/19  1:52 PM   Specimen: Nasopharyngeal Swab  Result Value Ref Range Status   SARS Coronavirus 2 NEGATIVE NEGATIVE Final    Comment: (NOTE) SARS-CoV-2 target nucleic acids are NOT DETECTED. The SARS-CoV-2  RNA is generally detectable in upper and lower respiratory specimens during the acute phase of infection. Negative results do not preclude SARS-CoV-2 infection, do not rule out co-infections with other pathogens, and should not be used as the sole basis for treatment or other patient management decisions. Negative results must be combined with clinical observations, patient history, and epidemiological information. The expected result is Negative. Fact Sheet for  Patients: SugarRoll.be Fact Sheet for Healthcare Providers: https://www.woods-mathews.com/ This test is not yet approved or cleared by the [place] FDA and  has been authorized for detection and/or diagnosis of SARS-CoV-2 by FDA under an Emergency Use Authorization (EUA). This EUA will remain  in effect (meaning this test can be used) for the duration of the COVID-19 declaration under Section 56 4(b)(1) of the Act, 21 U.S.C. section 360bbb-3(b)(1), unless the authorization is terminated or revoked sooner. Performed at [facility], [place] [address], [place], [place] [address]   Body fluid culture     Status: None (Preliminary result)   Collection Time: 01/15/19 11:02 AM   Specimen: Pleura; Body Fluid  Result Value Ref Range Status   Specimen Description   Final    PLEURAL Performed at [facility], [address] [place], [place] [address]    Special Requests   Final    FLUID Performed at [facility], [address] [place], [place] [address]    Gram Stain   Final    FEW WBC PRESENT, PREDOMINANTLY PMN NO ORGANISMS SEEN    Culture   Final    NO GROWTH 2 DAYS Performed at [facility], [place] [address], [place], [place] [address]    Report Status PENDING  Incomplete    Procedures and diagnostic studies:  Dg Chest 1 View  Result Date: 01/17/2019 CLINICAL DATA:  Empyema. EXAM: CHEST  1 VIEW COMPARISON:  Chest x-ray dated 01/16/2019.  FINDINGS: LEFT-sided chest tube is stable in position. Streaky perihilar opacities persist, atelectasis versus edema. LEFT lung base remains well aerated. RIGHT lung remains clear. Heart size and mediastinal contours are stable. IMPRESSION: Stable chest x-ray. LEFT-sided chest tube is stable in position. LEFT lower lung remains well aerated. Probable atelectasis and/or mild edema within the LEFT perihilar lung. Electronically Signed   By: Franki Cabot M.D.   On: 01/17/2019 07:04   Dg Chest 1 View  Result Date: 01/16/2019 CLINICAL DATA:  Empyema of lung. EXAM: CHEST  1 VIEW COMPARISON:  Chest CT dated 01/14/2019. FINDINGS: Interval placement of a LEFT-sided chest tube, coiled at the lateral aspect of the LEFT mid lung. Significant improvement in aeration at the LEFT lung base. Persistent streaky opacities within the LEFT perihilar lung and at the LEFT lung base, presumably atelectasis. RIGHT lung is clear. No pneumothorax seen. Heart size and mediastinal contours are within normal limits. IMPRESSION: 1. Interval placement of a LEFT-sided chest tube, coiled at the lateral aspect of the LEFT mid lung. Significant improvement in aeration at the LEFT lung base. 2. Probable atelectasis within the LEFT lung. Electronically Signed   By: Franki Cabot M.D.   On: 01/16/2019 07:53    Medications:   . alteplase (tPA) 10mg  in NS 49mL for Dr.Oaks (intrapleural administration/[facility])   Intrapleural Once  . amLODipine  10 mg Oral Daily  . baclofen  10 mg Oral QHS  . calcium carbonate  1 tablet Oral BID  . carvedilol  25 mg Oral BID WC  . docusate sodium  100 mg Oral BID  . enoxaparin (LOVENOX) injection  40 mg Subcutaneous Q24H  . ferrous sulfate  325 mg Oral Daily  . losartan  100 mg Oral Daily  . omega-3 acid ethyl esters  1 g Oral Daily  . pantoprazole  40 mg Oral Daily  . tamsulosin  0.4 mg Oral Daily   Continuous Infusions: . sodium chloride 10 mL/hr at 01/16/19 1700  . cefTRIAXone (ROCEPHIN)  IV  Stopped (01/16/19 1407)  . metronidazole 500 mg (01/17/19 0525)     LOS: 4 days   [facility]  [facility]   *Please refer to [email or web address], password [email or web address] to get updated schedule on who will round on this patient, as hospitalists switch teams weekly. If 7PM-7AM, please contact night-coverage at [email or web address], password [email or web address] for any overnight needs.  01/17/2019, 11:22 AM

## 2019-01-17 NOTE — Consult Note (Signed)
PHARMACY CONSULT NOTE - FOLLOW UP  Pharmacy Consult for Electrolyte Monitoring and Replacement   Recent Labs: Potassium (mmol/L)  Date Value  01/17/2019 3.8   Magnesium (mg/dL)  Date Value  01/17/2019 1.7   Calcium (mg/dL)  Date Value  01/17/2019 7.9 (L)   Albumin (g/dL)  Date Value  01/13/2019 2.5 (L)  01/06/2019 2.9 (L)   Phosphorus (mg/dL)  Date Value  01/16/2019 2.8   Sodium (mmol/L)  Date Value  01/17/2019 137  01/12/2019 138     Assessment: Pharmacy has been consulted for potassium management.  K 3.8  Mag 1.7  Scr 0.74 Patient on Hydrochlorothiazide 25 mg daily  Goal of Therapy:  Potassium WNL   Plan:  Will order Magnesium 2 gram IV x 1 again today 11/29 Will f/u electrolytes with am labs   Noralee Space ,PharmD Clinical Pharmacist 01/17/2019 7:31 AM

## 2019-01-17 NOTE — Progress Notes (Signed)
Subjective:  CC: Jonathan Burns is a 73 y.o. male  Hospital stay day 4,   lung empyema  HPI: No issues today.  Continues to feel well.  ROS:  General: Denies weight loss, weight gain, fatigue, fevers, chills, and night sweats. Heart: Denies chest pain, palpitations, racing heart, irregular heartbeat, leg pain or swelling, and decreased activity tolerance. Respiratory: Denies breathing difficulty, shortness of breath, wheezing, cough, and sputum. GI: Denies change in appetite, heartburn, nausea, vomiting, constipation, diarrhea, and blood in stool. GU: Denies difficulty urinating, pain with urinating, urgency, frequency, blood in urine.   Objective:   Temp:  [97.5 F (36.4 C)-98.3 F (36.8 C)] 97.5 F (36.4 C) (11/29 0830) Pulse Rate:  [76-94] 94 (11/29 0830) Resp:  [16-18] 16 (11/29 0830) BP: (109-134)/(62-69) 127/69 (11/29 0830) SpO2:  [91 %-93 %] 93 % (11/29 0830)     Height: 5\' 8"  (172.7 cm) Weight: 95.3 kg BMI (Calculated): 31.94   Intake/Output this shift:   Intake/Output Summary (Last 24 hours) at 01/17/2019 1409 Last data filed at 01/16/2019 1811 Gross per 24 hour  Intake 1076.3 ml  Output -  Net 1076.3 ml    Constitutional :  alert, cooperative, appears stated age and no distress  Respiratory:  clear to auscultation bilaterally.  Chest tube catheter with serosanguineous drainage.  Dressing intact.  Cardiovascular:  regular rate and rhythm  Gastrointestinal: soft, non-tender; bowel sounds normal; no masses,  no organomegaly.   Skin: Cool and moist.   Psychiatric: Normal affect, non-agitated, not confused       LABS:  CMP Latest Ref Rng & Units 01/17/2019 01/16/2019 01/15/2019  Glucose 70 - 99 mg/dL 95 105(H) 107(H)  BUN 8 - 23 mg/dL 14 13 12   Creatinine 0.61 - 1.24 mg/dL 0.74 0.83 0.62  Sodium 135 - 145 mmol/L 137 136 139  Potassium 3.5 - 5.1 mmol/L 3.8 3.4(L) 3.1(L)  Chloride 98 - 111 mmol/L 104 102 104  CO2 22 - 32 mmol/L 20(L) 24 25  Calcium 8.9 - 10.3  mg/dL 7.9(L) 7.8(L) 7.7(L)  Total Protein 6.5 - 8.1 g/dL - - -  Total Bilirubin 0.3 - 1.2 mg/dL - - -  Alkaline Phos 38 - 126 U/L - - -  AST 15 - 41 U/L - - -  ALT 0 - 44 U/L - - -   CBC Latest Ref Rng & Units 01/17/2019 01/16/2019 01/15/2019  WBC 4.0 - 10.5 K/uL 16.1(H) 14.0(H) 17.1(H)  Hemoglobin 13.0 - 17.0 g/dL 12.3(L) 12.0(L) 11.8(L)  Hematocrit 39.0 - 52.0 % 35.5(L) 36.5(L) 35.2(L)  Platelets 150 - 400 K/uL 477(H) 469(H) 475(H)    RADS: CLINICAL DATA:  Empyema.  EXAM: CHEST  1 VIEW  COMPARISON:  Chest x-ray dated 01/16/2019.  FINDINGS: LEFT-sided chest tube is stable in position. Streaky perihilar opacities persist, atelectasis versus edema. LEFT lung base remains well aerated. RIGHT lung remains clear. Heart size and mediastinal contours are stable.  IMPRESSION: Stable chest x-ray. LEFT-sided chest tube is stable in position. LEFT lower lung remains well aerated. Probable atelectasis and/or mild edema within the LEFT perihilar lung.   Electronically Signed   By: Franki Cabot M.D.   On: 01/17/2019 07:04  Assessment:   Lung empyema-  No change clinically but increased wbc.  I personally Administered tPA via catheter tube  again today to further encourage drainage.  Keep clamped for 4hrs, then ok to resume on active suction .  Will continue to monitor output and serial cxr for resolution.

## 2019-01-18 ENCOUNTER — Inpatient Hospital Stay: Payer: Medicare Other

## 2019-01-18 DIAGNOSIS — E876 Hypokalemia: Secondary | ICD-10-CM | POA: Diagnosis not present

## 2019-01-18 DIAGNOSIS — J869 Pyothorax without fistula: Secondary | ICD-10-CM | POA: Diagnosis not present

## 2019-01-18 LAB — CBC WITH DIFFERENTIAL/PLATELET
Abs Immature Granulocytes: 0.18 10*3/uL — ABNORMAL HIGH (ref 0.00–0.07)
Basophils Absolute: 0 10*3/uL (ref 0.0–0.1)
Basophils Relative: 0 %
Eosinophils Absolute: 0.1 10*3/uL (ref 0.0–0.5)
Eosinophils Relative: 1 %
HCT: 35.2 % — ABNORMAL LOW (ref 39.0–52.0)
Hemoglobin: 12.1 g/dL — ABNORMAL LOW (ref 13.0–17.0)
Immature Granulocytes: 1 %
Lymphocytes Relative: 7 %
Lymphs Abs: 1.2 10*3/uL (ref 0.7–4.0)
MCH: 30.8 pg (ref 26.0–34.0)
MCHC: 34.4 g/dL (ref 30.0–36.0)
MCV: 89.6 fL (ref 80.0–100.0)
Monocytes Absolute: 1.1 10*3/uL — ABNORMAL HIGH (ref 0.1–1.0)
Monocytes Relative: 7 %
Neutro Abs: 13.1 10*3/uL — ABNORMAL HIGH (ref 1.7–7.7)
Neutrophils Relative %: 84 %
Platelets: 489 10*3/uL — ABNORMAL HIGH (ref 150–400)
RBC: 3.93 MIL/uL — ABNORMAL LOW (ref 4.22–5.81)
RDW: 14.2 % (ref 11.5–15.5)
WBC: 15.6 10*3/uL — ABNORMAL HIGH (ref 4.0–10.5)
nRBC: 0 % (ref 0.0–0.2)

## 2019-01-18 LAB — BASIC METABOLIC PANEL
Anion gap: 13 (ref 5–15)
BUN: 11 mg/dL (ref 8–23)
CO2: 20 mmol/L — ABNORMAL LOW (ref 22–32)
Calcium: 7.6 mg/dL — ABNORMAL LOW (ref 8.9–10.3)
Chloride: 103 mmol/L (ref 98–111)
Creatinine, Ser: 0.64 mg/dL (ref 0.61–1.24)
GFR calc Af Amer: >60 mL/min (ref >60)
GFR calc non Af Amer: >60 mL/min (ref >60)
Glucose, Bld: 92 mg/dL (ref 70–99)
Potassium: 3.7 mmol/L (ref 3.5–5.1)
Sodium: 136 mmol/L (ref 135–145)

## 2019-01-18 LAB — CULTURE, BLOOD (ROUTINE X 2)
Culture: NO GROWTH
Culture: NO GROWTH
Special Requests: ADEQUATE
Special Requests: ADEQUATE

## 2019-01-18 LAB — MAGNESIUM: Magnesium: 1.6 mg/dL — ABNORMAL LOW (ref 1.7–2.4)

## 2019-01-18 LAB — CYTOLOGY - NON PAP

## 2019-01-18 MED ORDER — SODIUM CHLORIDE (PF) 0.9 % IJ SOLN
Freq: Once | INTRAMUSCULAR | Status: AC
  Administered 2019-01-18: 16:00:00 via INTRAPLEURAL
  Filled 2019-01-18: qty 10

## 2019-01-18 MED ORDER — MAGNESIUM SULFATE 4 GM/100ML IV SOLN
4.0000 g | Freq: Once | INTRAVENOUS | Status: AC
  Administered 2019-01-18: 4 g via INTRAVENOUS
  Filled 2019-01-18: qty 100

## 2019-01-18 MED ORDER — MAGNESIUM SULFATE 2 GM/50ML IV SOLN: 2.0000 g | Freq: Once | INTRAVENOUS | Status: DC

## 2019-01-18 NOTE — Progress Notes (Addendum)
Progress Note    Jonathan Burns  FHQ:[id number] DOB: 1945/02/25  DOA: 01/13/2019 PCP: Valerie Roys, DO      Brief Narrative:    Medical records reviewed and are as summarized below:  Jonathan Burns is an 73 y.o. male with history of adenocarcinoma of the prostate status post radiation therapy, peptic ulcer disease, COPD, hyperlipidemia, hypertension, presented to the hospital with intermittent left flank pain OF about 7 to 10 days duration.  He was found to have left-sided empyema requiring drainage via chest tube      Assessment/Plan:   Active Problems:   Empyema of left pleural space (HCC)   Hypokalemia   Body mass index is 31.93 kg/m.   Left pleural effusion/empyema: s/p TPA on 11/27, 11/28 and 11/29 . S/p chest tube placement on 01/15/2019.  Continue IV Rocephin and Flagyl.  Patient has been evaluated by CT surgeon with plans for possible thoracoscopy and thoracotomy on January 20, 2019.  Leukocytosis: WBC is fluctuating but overall it has improved.  This is likely due to empyema.  Hypertension: Continue antihypertensives  Hypokalemia: Improved.   GERD/history of peptic ulcer disease: Continue Protonix  History of adenocarcinoma of the prostate stage III B: s/p radiation therapy: Continue Flomax       Family Communication/Anticipated D/C date and plan/Code Status   DVT prophylaxis: Lovenox Code Status: Full code Family Communication: Plan discussed with the patient and his daughter at the bedside Disposition Plan: Unknown at this time      Subjective:   No fever, chills, shortness of breath or cough or chest pain.  He feels okay.  Objective:    Vitals:   01/17/19 1735 01/17/19 2033 01/18/19 0510 01/18/19 1316  BP: 129/69 (!) 116/59 121/68 119/68  Pulse: 96 87 95 87  Resp: 18 20 20 17   Temp: 98 F (36.7 C) 98.8 F (37.1 C) 98.4 F (36.9 C) 98.4 F (36.9 C)  TempSrc: Oral Oral Axillary Oral  SpO2: 93% 93% 92% 95%  Weight:       Height:        Intake/Output Summary (Last 24 hours) at 01/18/2019 1402 Last data filed at 01/18/2019 1155 Gross per 24 hour  Intake 2635.34 ml  Output 460 ml  Net 2175.34 ml   Filed Weights   01/15/19 0938  Weight: 95.3 kg    Exam:  GEN: NAD SKIN: No rash EYES: no pallor or icterus,  ENT: MMM CV: RRR PULM: CTA B. Left chest tube with purulent drainage ABD: soft, ND, NT, +BS CNS: AAO x 3, non focal EXT: No edema or tenderness    Data Reviewed:   I have personally reviewed following labs and imaging studies:  Labs: Labs show the following:   Basic Metabolic Panel: Recent Labs  Lab 01/13/19 1208 01/14/19 0505 01/15/19 0711 01/15/19 1819 01/16/19 0516 01/17/19 0540 01/18/19 0515  NA 133*  --  139  --  136 137 136  K 3.0* 3.2* 3.1*  --  3.4* 3.8 3.7  CL 93*  --  104  --  102 104 103  CO2 26  --  25  --  24 20* 20*  GLUCOSE 110*  --  107*  --  105* 95 92  BUN 27*  --  12  --  13 14 11   CREATININE 0.98 0.72 0.62  --  0.83 0.74 0.64  CALCIUM 8.3*  --  7.7*  --  7.8* 7.9* 7.6*  MG  --   --  1.2* 1.9 1.7 1.7 1.6*  PHOS  --   --   --   --  2.8  --   --    GFR Estimated Creatinine Clearance: 90.8 mL/min (by C-G formula based on SCr of 0.64 mg/dL). Liver Function Tests: Recent Labs  Lab 01/13/19 1208  AST 34  ALT 54*  ALKPHOS 151*  BILITOT 0.7  PROT 7.5  ALBUMIN 2.5*   No results for input(s): LIPASE, AMYLASE in the last 168 hours. No results for input(s): AMMONIA in the last 168 hours. Coagulation profile No results for input(s): INR, PROTIME in the last 168 hours.  CBC: Recent Labs  Lab 01/12/19 0823 01/13/19 1208 01/15/19 0711 01/16/19 0516 01/17/19 0540 01/18/19 0515  WBC 22.0* 21.2* 17.1* 14.0* 16.1* 15.6*  NEUTROABS 19.0* 18.2*  --  11.6* 13.3* 13.1*  HGB 14.0 13.2 11.8* 12.0* 12.3* 12.1*  HCT 42.4 38.8* 35.2* 36.5* 35.5* 35.2*  MCV 95 91.9 91.9 92.9 89.2 89.6  PLT 610* 567* 475* 469* 477* 489*   Cardiac Enzymes: No results for  input(s): CKTOTAL, CKMB, CKMBINDEX, TROPONINI in the last 168 hours. BNP (last 3 results) No results for input(s): PROBNP in the last 8760 hours. CBG: No results for input(s): GLUCAP in the last 168 hours. D-Dimer: No results for input(s): DDIMER in the last 72 hours. Hgb A1c: No results for input(s): HGBA1C in the last 72 hours. Lipid Profile: No results for input(s): CHOL, HDL, LDLCALC, TRIG, CHOLHDL, LDLDIRECT in the last 72 hours. Thyroid function studies: No results for input(s): TSH, T4TOTAL, T3FREE, THYROIDAB in the last 72 hours.  Invalid input(s): FREET3 Anemia work up: No results for input(s): VITAMINB12, FOLATE, FERRITIN, TIBC, IRON, RETICCTPCT in the last 72 hours. Sepsis Labs: Recent Labs  Lab 01/15/19 0711 01/16/19 0516 01/17/19 0540 01/18/19 0515  WBC 17.1* 14.0* 16.1* 15.6*    Microbiology Recent Results (from the past 240 hour(s))  Blood culture (routine x 2)     Status: None   Collection Time: 01/13/19 12:08 PM   Specimen: BLOOD  Result Value Ref Range Status   Specimen Description BLOOD LEFT ANTECUBITAL  Final   Special Requests   Final    BOTTLES DRAWN AEROBIC AND ANAEROBIC Blood Culture adequate volume   Culture   Final    NO GROWTH 5 DAYS Performed at [facility], [address] [place], [place] [address]    Report Status 01/18/2019 FINAL  Final  Blood culture (routine x 2)     Status: None   Collection Time: 01/13/19 12:09 PM   Specimen: BLOOD  Result Value Ref Range Status   Specimen Description BLOOD RIGHT ANTECUBITAL  Final   Special Requests   Final    BOTTLES DRAWN AEROBIC AND ANAEROBIC Blood Culture adequate volume   Culture   Final    NO GROWTH 5 DAYS Performed at [facility], [address] [place], [place] [address]    Report Status 01/18/2019 FINAL  Final  SARS CORONAVIRUS 2 (TAT 6-24 HRS) Nasopharyngeal Nasopharyngeal Swab     Status: None   Collection Time: 01/13/19  1:52 PM   Specimen: Nasopharyngeal  Swab  Result Value Ref Range Status   SARS Coronavirus 2 NEGATIVE NEGATIVE Final    Comment: (NOTE) SARS-CoV-2 target nucleic acids are NOT DETECTED. The SARS-CoV-2 RNA is generally detectable in upper and lower respiratory specimens during the acute phase of infection. Negative results do not preclude SARS-CoV-2 infection, do not rule out co-infections with other pathogens, and should not  be used as the sole basis for treatment or other patient management decisions. Negative results must be combined with clinical observations, patient history, and epidemiological information. The expected result is Negative. Fact Sheet for Patients: SugarRoll.be Fact Sheet for Healthcare Providers: https://www.woods-mathews.com/ This test is not yet approved or cleared by the [place] FDA and  has been authorized for detection and/or diagnosis of SARS-CoV-2 by FDA under an Emergency Use Authorization (EUA). This EUA will remain  in effect (meaning this test can be used) for the duration of the COVID-19 declaration under Section 56 4(b)(1) of the Act, 21 U.S.C. section 360bbb-3(b)(1), unless the authorization is terminated or revoked sooner. Performed at [facility], [place] [address], [place], [place] [address]   Body fluid culture     Status: None (Preliminary result)   Collection Time: 01/15/19 11:02 AM   Specimen: Pleura; Body Fluid  Result Value Ref Range Status   Specimen Description   Final    PLEURAL Performed at [facility], [address] [place], [place] [address]    Special Requests   Final    FLUID Performed at [facility], [address] [place], [place] [address]    Gram Stain   Final    FEW WBC PRESENT, PREDOMINANTLY PMN NO ORGANISMS SEEN    Culture   Final    NO GROWTH 3 DAYS Performed at [facility], [place] [address] [place], [place] [address]    Report Status PENDING  Incomplete     Procedures and diagnostic studies:  Dg Chest 1 View  Result Date: 01/18/2019 CLINICAL DATA:  Empyema. EXAM: CHEST  1 VIEW COMPARISON:  01/17/2019.  CT 01/14/2019. FINDINGS: Left chest tube in stable position. No pneumothorax. Persistent density over the left upper chest, most likely loculated pleural fluid in the left major fissure as noted on prior recent CT. Bibasilar subsegmental atelectasis. Heart size stable. No acute bony abnormality. Mild left chest wall subcutaneous emphysema. IMPRESSION: 1. Left chest tube in stable position. No pneumothorax. Mild left chest wall subcutaneous emphysema. 2. Density noted left upper chest consistent with loculated pleural fluid in the left major fissure as noted on recent CT. No interim change. 3.  Mild bibasilar atelectasis. Electronically Signed   By: Marcello Moores  Register   On: 01/18/2019 07:13   Dg Chest 1 View  Result Date: 01/17/2019 CLINICAL DATA:  Empyema. EXAM: CHEST  1 VIEW COMPARISON:  Chest x-ray dated 01/16/2019. FINDINGS: LEFT-sided chest tube is stable in position. Streaky perihilar opacities persist, atelectasis versus edema. LEFT lung base remains well aerated. RIGHT lung remains clear. Heart size and mediastinal contours are stable. IMPRESSION: Stable chest x-ray. LEFT-sided chest tube is stable in position. LEFT lower lung remains well aerated. Probable atelectasis and/or mild edema within the LEFT perihilar lung. Electronically Signed   By: Franki Cabot M.D.   On: 01/17/2019 07:04    Medications:   . alteplase (tPA) 10mg  in NS 42mL for Dr.Oaks (intrapleural administration/[facility])   Intrapleural Once  . amLODipine  10 mg Oral Daily  . baclofen  10 mg Oral QHS  . calcium carbonate  1 tablet Oral BID  . carvedilol  25 mg Oral BID WC  . docusate sodium  100 mg Oral BID  . enoxaparin (LOVENOX) injection  40 mg Subcutaneous Q24H  . ferrous sulfate  325 mg Oral Daily  . losartan  100 mg Oral Daily  . omega-3 acid ethyl esters  1 g Oral Daily   .  pantoprazole  40 mg Oral Daily  . tamsulosin  0.4 mg Oral Daily   Continuous Infusions: . sodium chloride 75 mL/hr at 01/18/19 1155  . sodium chloride 10 mL/hr at 01/18/19 1155  . cefTRIAXone (ROCEPHIN)  IV Stopped (01/17/19 1544)  . metronidazole Stopped (01/18/19 0615)     LOS: 5 days   [facility]  [facility]   *Please refer to [email or web address], password [email or web address] to get updated schedule on who will round on this patient, as hospitalists switch teams weekly. If 7PM-7AM, please contact night-coverage at [email or web address], password [email or web address] for any overnight needs.  01/18/2019, 2:02 PM

## 2019-01-18 NOTE — Procedures (Signed)
10 mg of intrapleural TPA administered without incident.  Tube clamped for 4 hours.

## 2019-01-18 NOTE — Consult Note (Signed)
PHARMACY CONSULT NOTE - FOLLOW UP  Pharmacy Consult for Electrolyte Monitoring and Replacement   Recent Labs: Potassium (mmol/L)  Date Value  01/18/2019 3.7   Magnesium (mg/dL)  Date Value  01/18/2019 1.6 (L)   Calcium (mg/dL)  Date Value  01/18/2019 7.6 (L)   Albumin (g/dL)  Date Value  01/13/2019 2.5 (L)  01/06/2019 2.9 (L)   Phosphorus (mg/dL)  Date Value  01/16/2019 2.8   Sodium (mmol/L)  Date Value  01/18/2019 136  01/12/2019 138     Assessment: Pharmacy has been consulted for potassium management.  K 3.8  Mag 1.6  Scr 0.64 Patient on Hydrochlorothiazide 25 mg daily  Goal of Therapy:  Potassium WNL   Plan:  Will order Magnesium 4 gram IV x 1 again today 11/29 Will f/u electrolytes with am labs   Pearla Dubonnet ,PharmD Clinical Pharmacist 01/18/2019 7:29 AM

## 2019-01-18 NOTE — H&P (View-Only) (Signed)
  Patient ID: Jonathan Burns, male   DOB: 1945-12-16, 73 y.o.   MRN: [id number]  HISTORY: No new complaints.  He states that he is not short of breath.  He has had no fevers.  He has minimal pain.   Vitals:   01/17/19 2033 01/18/19 0510  BP: (!) 116/59 121/68  Pulse: 87 95  Resp: 20 20  Temp: 98.8 F (37.1 C) 98.4 F (36.9 C)  SpO2: 93% 92%     EXAM:    Resp: Lungs are clear on the right and decreased on the left.  No respiratory distress, normal effort. Heart:  Regular without murmurs Abd:  Abdomen is soft, non distended and non tender. No masses are palpable.  There is no rebound and no guarding.  Neurological: Alert and oriented to person, place, and time. Coordination normal.  Skin: Skin is warm and dry. No rash noted. No diaphoretic. No erythema. No pallor.  Psychiatric: Normal mood and affect. Normal behavior. Judgment and thought content normal.   There is no air leak from the chest tube.  There is purulent drainage present.  Independent review of his chest x-ray shows continued haziness at the left base as well as a pseudotumor.   ASSESSMENT: Left empyema   PLAN:   I had a long discussion with the patient and his daughter regarding the options.  I explained to them the indications and risks of thoracoscopy and probable thoracotomy.  I reviewed with him the risks of bleeding, infection, air leak, respiratory insufficiency and death.  Overall he is in very good health for his age.  I believe that all of his questions were answered.  Currently he is on the schedule for Wednesday.  We will try TPA again today with a repeat chest x-ray tomorrow.  If there is no improvement surgery will be on Wednesday.    Nestor Lewandowsky, MD

## 2019-01-18 NOTE — Progress Notes (Signed)
  Patient ID: Jonathan Burns, male   DOB: 1945-10-18, 73 y.o.   MRN: [id number]  HISTORY: No new complaints.  He states that he is not short of breath.  He has had no fevers.  He has minimal pain.   Vitals:   01/17/19 2033 01/18/19 0510  BP: (!) 116/59 121/68  Pulse: 87 95  Resp: 20 20  Temp: 98.8 F (37.1 C) 98.4 F (36.9 C)  SpO2: 93% 92%     EXAM:    Resp: Lungs are clear on the right and decreased on the left.  No respiratory distress, normal effort. Heart:  Regular without murmurs Abd:  Abdomen is soft, non distended and non tender. No masses are palpable.  There is no rebound and no guarding.  Neurological: Alert and oriented to person, place, and time. Coordination normal.  Skin: Skin is warm and dry. No rash noted. No diaphoretic. No erythema. No pallor.  Psychiatric: Normal mood and affect. Normal behavior. Judgment and thought content normal.   There is no air leak from the chest tube.  There is purulent drainage present.  Independent review of his chest x-ray shows continued haziness at the left base as well as a pseudotumor.   ASSESSMENT: Left empyema   PLAN:   I had a long discussion with the patient and his daughter regarding the options.  I explained to them the indications and risks of thoracoscopy and probable thoracotomy.  I reviewed with him the risks of bleeding, infection, air leak, respiratory insufficiency and death.  Overall he is in very good health for his age.  I believe that all of his questions were answered.  Currently he is on the schedule for Wednesday.  We will try TPA again today with a repeat chest x-ray tomorrow.  If there is no improvement surgery will be on Wednesday.    Nestor Lewandowsky, MD

## 2019-01-18 NOTE — Care Management Important Message (Signed)
Important Message  Patient Details  Name: Jonathan Burns MRN: [id number] Date of Birth: 01/23/1946   Medicare Important Message Given:  Yes     [name] 01/18/2019, 2:02 PM

## 2019-01-19 ENCOUNTER — Inpatient Hospital Stay: Payer: Medicare Other

## 2019-01-19 DIAGNOSIS — E876 Hypokalemia: Secondary | ICD-10-CM | POA: Diagnosis not present

## 2019-01-19 DIAGNOSIS — J869 Pyothorax without fistula: Secondary | ICD-10-CM | POA: Diagnosis not present

## 2019-01-19 LAB — CBC WITH DIFFERENTIAL/PLATELET
Abs Immature Granulocytes: 0.19 10*3/uL — ABNORMAL HIGH (ref 0.00–0.07)
Basophils Absolute: 0 10*3/uL (ref 0.0–0.1)
Basophils Relative: 0 %
Eosinophils Absolute: 0.1 10*3/uL (ref 0.0–0.5)
Eosinophils Relative: 1 %
HCT: 35.4 % — ABNORMAL LOW (ref 39.0–52.0)
Hemoglobin: 11.8 g/dL — ABNORMAL LOW (ref 13.0–17.0)
Immature Granulocytes: 1 %
Lymphocytes Relative: 7 %
Lymphs Abs: 1 10*3/uL (ref 0.7–4.0)
MCH: 30.7 pg (ref 26.0–34.0)
MCHC: 33.3 g/dL (ref 30.0–36.0)
MCV: 92.2 fL (ref 80.0–100.0)
Monocytes Absolute: 1.2 10*3/uL — ABNORMAL HIGH (ref 0.1–1.0)
Monocytes Relative: 8 %
Neutro Abs: 13.2 10*3/uL — ABNORMAL HIGH (ref 1.7–7.7)
Neutrophils Relative %: 83 %
Platelets: 504 10*3/uL — ABNORMAL HIGH (ref 150–400)
RBC: 3.84 MIL/uL — ABNORMAL LOW (ref 4.22–5.81)
RDW: 14.2 % (ref 11.5–15.5)
WBC: 15.8 10*3/uL — ABNORMAL HIGH (ref 4.0–10.5)
nRBC: 0 % (ref 0.0–0.2)

## 2019-01-19 LAB — BODY FLUID CULTURE: Culture: NO GROWTH

## 2019-01-19 LAB — BASIC METABOLIC PANEL
Anion gap: 10 (ref 5–15)
BUN: 10 mg/dL (ref 8–23)
CO2: 22 mmol/L (ref 22–32)
Calcium: 7.4 mg/dL — ABNORMAL LOW (ref 8.9–10.3)
Chloride: 104 mmol/L (ref 98–111)
Creatinine, Ser: 0.59 mg/dL — ABNORMAL LOW (ref 0.61–1.24)
GFR calc Af Amer: >60 mL/min (ref >60)
GFR calc non Af Amer: >60 mL/min (ref >60)
Glucose, Bld: 100 mg/dL — ABNORMAL HIGH (ref 70–99)
Potassium: 3.5 mmol/L (ref 3.5–5.1)
Sodium: 136 mmol/L (ref 135–145)

## 2019-01-19 LAB — ABO/RH: ABO/RH(D): A POS

## 2019-01-19 LAB — MAGNESIUM: Magnesium: 1.7 mg/dL (ref 1.7–2.4)

## 2019-01-19 MED ORDER — LORAZEPAM 2 MG/ML IJ SOLN
0.5000 mg | Freq: Four times a day (QID) | INTRAMUSCULAR | Status: DC | PRN
  Administered 2019-01-19 (×2): 0.5 mg via INTRAVENOUS
  Administered 2019-01-20: 1 mg via INTRAVENOUS
  Filled 2019-01-19 (×3): qty 1

## 2019-01-19 MED ORDER — POTASSIUM CHLORIDE CRYS ER 20 MEQ PO TBCR
40.0000 meq | EXTENDED_RELEASE_TABLET | Freq: Once | ORAL | Status: AC
  Administered 2019-01-19: 40 meq via ORAL
  Filled 2019-01-19: qty 2

## 2019-01-19 MED ORDER — MAGNESIUM SULFATE 2 GM/50ML IV SOLN
2.0000 g | Freq: Once | INTRAVENOUS | Status: AC
  Administered 2019-01-19: 13:00:00 2 g via INTRAVENOUS
  Filled 2019-01-19: qty 50

## 2019-01-19 NOTE — Progress Notes (Signed)
[facility] SURGICAL PROGRESS NOTE (cpt [phone])  Hospital Day(s): 6.  Interval History: Patient seen and examined, no acute events or new complaints overnight. Patient reports he is feeling good this morning. He denied any fever, chills, chest pain, or SOB. He had tPA instilled again on 11/30 with about 450 ccs out of chest tube in last 24 hours. Repeat CXR this morning. No other issues.   Review of Systems:  Constitutional: denies fever, chills  HEENT: denies cough or congestion  Respiratory: denies any shortness of breath  Cardiovascular: denies chest pain or palpitations  Gastrointestinal: denies abdominal pain, N/V, or diarrhea/and bowel function as per interval history Genitourinary: denies burning with urination or urinary frequency  Vital signs in last 24 hours: [min-max] current  Temp:  [98.4 F (36.9 C)-98.8 F (37.1 C)] 98.8 F (37.1 C) (12/01 0517) Pulse Rate:  [80-94] 94 (12/01 0517) Resp:  [17-20] 20 (12/01 0517) BP: (107-135)/(54-68) 135/65 (12/01 0517) SpO2:  [92 %-95 %] 92 % (12/01 0517)     Height: 5\' 8"  (172.7 cm) Weight: 95.3 kg BMI (Calculated): 31.94   Intake/Output last 2 shifts:  11/30 0701 - 12/01 0700 In: 3119.3 [P.O.:760; I.V.:1803.7; IV Piggyback:555.5] Out: 440 [Chest Tube:440]   Physical Exam:  Constitutional: alert, cooperative and no distress  HENT: normocephalic without obvious abnormality  Eyes: PERRL, EOM's grossly intact and symmetric  Respiratory: breathing non-labored at rest, decreased breath sounds on the left, right is clear to auscultation   Chest: Chest tube site on the left is clean, dry, and intact Cardiovascular: regular rate and sinus rhythm  Musculoskeletal: no edema or wounds, motor and sensation grossly intact, NT    Labs:  CBC Latest Ref Rng & Units 01/19/2019 01/18/2019 01/17/2019  WBC 4.0 - 10.5 K/uL 15.8(H) 15.6(H) 16.1(H)  Hemoglobin 13.0 - 17.0 g/dL 11.8(L) 12.1(L) 12.3(L)  Hematocrit 39.0 - 52.0 %  35.4(L) 35.2(L) 35.5(L)  Platelets 150 - 400 K/uL 504(H) 489(H) 477(H)   CMP Latest Ref Rng & Units 01/19/2019 01/18/2019 01/17/2019  Glucose 70 - 99 mg/dL 100(H) 92 95  BUN 8 - 23 mg/dL 10 11 14   Creatinine 0.61 - 1.24 mg/dL 0.59(L) 0.64 0.74  Sodium 135 - 145 mmol/L 136 136 137  Potassium 3.5 - 5.1 mmol/L 3.5 3.7 3.8  Chloride 98 - 111 mmol/L 104 103 104  CO2 22 - 32 mmol/L 22 20(L) 20(L)  Calcium 8.9 - 10.3 mg/dL 7.4(L) 7.6(L) 7.9(L)  Total Protein 6.5 - 8.1 g/dL - - -  Total Bilirubin 0.3 - 1.2 mg/dL - - -  Alkaline Phos 38 - 126 U/L - - -  AST 15 - 41 U/L - - -  ALT 0 - 44 U/L - - -     Imaging studies:   CXR (01/19/2019) personally reviewed which appears unchanged from prior, and radiologist report reivewed IMPRESSION: 1. No significant change in small left pleural effusion, and small loculated left hydropneumothorax along the major fissure. 2. Stable central left upper lobe airspace disease.   Assessment/Plan: (ICD-10's: J86.9) 73 y.o. male with unchanged left pleural effusion despite multiple tPA instillation   - Given lack of improvement with tPA, agree patient will benefit from VATS and decortication given loculated pleural effusion with Dr Genevive Bi.  - All risks, benefits, and alternatives to above procedure(s) were discussed with the patient, all of his questions were answered to his expressed satisfaction, patient expresses he wishes to proceed, and informed consent was obtained.   - NPO after midnight  - pain  control prn; added anxiolytic prn   - continue ABx   - further management per primary team   All of the above findings and recommendations were discussed with the patient, and the medical team, and all of patient's questions were answered to his expressed satisfaction.  -- Jonathan Simon, PA-C [facility] 01/19/2019, 7:36 AM [phone] M-F: 7am - 4pm

## 2019-01-19 NOTE — Consult Note (Signed)
PHARMACY CONSULT NOTE - FOLLOW UP  Pharmacy Consult for Electrolyte Monitoring and Replacement   Recent Labs: Potassium (mmol/L)  Date Value  01/19/2019 3.5   Magnesium (mg/dL)  Date Value  01/19/2019 1.7   Calcium (mg/dL)  Date Value  01/19/2019 7.4 (L)   Albumin (g/dL)  Date Value  01/13/2019 2.5 (L)  01/06/2019 2.9 (L)   Phosphorus (mg/dL)  Date Value  01/16/2019 2.8   Sodium (mmol/L)  Date Value  01/19/2019 136  01/12/2019 138   Corrected Ca: 8.6 mg/dL  Assessment: 73 y.o. male with left pleural effusion. Pharmacy has been consulted for potassium management.  Goal of Therapy:  Potassium WNL   Plan:   Magnesium still borderline low: replace with magnesium 2 gram IV x 1   f/u electrolytes with am labs  Jonathan Burns ,PharmD Clinical Pharmacist 01/19/2019 9:19 AM

## 2019-01-19 NOTE — Progress Notes (Signed)
Progress Note    Jonathan Burns  IFO:[id number] DOB: [birth year]  DOA: 01/13/2019 PCP: Valerie Roys, DO      Brief Narrative:    Medical records reviewed and are as summarized below:  Jonathan Burns is an 73 y.o. male with history of adenocarcinoma of the prostate status post radiation therapy, peptic ulcer disease, COPD, hyperlipidemia, hypertension, presented to the hospital with intermittent left flank pain OF about 7 to 10 days duration.  He was found to have left-sided empyema requiring drainage via chest tube      Assessment/Plan:   Active Problems:   Empyema of left pleural space (HCC)   Hypokalemia   Body mass index is 31.93 kg/m.   Left pleural effusion/empyema: He has been getting intrapleural tPA since 01/15/2019 s/p chest tube placement on 01/15/2019.  Continue IV Rocephin and Flagyl.  Plan for possible thoracoscopy and thoracotomy tomorrow.  Follow-up with CT surgery and general surgeon.  Leukocytosis: Overall improved but persistent. This is likely due to empyema.  The CBC.  Hypertension: Continue antihypertensives  Hypokalemia: Improved.  Replete potassium  GERD/history of peptic ulcer disease: Continue Protonix  History of adenocarcinoma of the prostate stage III B: s/p radiation therapy: Continue Flomax       Family Communication/Anticipated D/C date and plan/Code Status   DVT prophylaxis: Lovenox Code Status: Full code Family Communication: Plan discussed with the patient and his daughter at the bedside Disposition Plan: Unknown at this time      Subjective:   He has no complaints.  He feels comfortable.  No complaints of fever, chest pain, shortness of breath or cough.  Objective:    Vitals:   01/18/19 0510 01/18/19 1316 01/18/19 2043 01/19/19 0517  BP: 121/68 119/68 (!) 107/54 135/65  Pulse: 95 87 80 94  Resp: 20 17 20 20   Temp: 98.4 F (36.9 C) 98.4 F (36.9 C) 98.7 F (37.1 C) 98.8 F (37.1 C)  TempSrc: Axillary  Oral Oral Oral  SpO2: 92% 95% 94% 92%  Weight:      Height:        Intake/Output Summary (Last 24 hours) at 01/19/2019 1234 Last data filed at 01/19/2019 0925 Gross per 24 hour  Intake 2376.98 ml  Output 440 ml  Net 1936.98 ml   Filed Weights   01/15/19 0938  Weight: 95.3 kg    Exam:  GEN: NAD SKIN: No rash EYES: No acute abnormality detected ENT: MMM CV: RRR PULM: CTA B.  Chest tube on left lateral chest wall connected to chest tube drainage system ABD: soft, obese, NT, +BS CNS: AAO x 3, non focal EXT: No edema or tenderness    Data Reviewed:   I have personally reviewed following labs and imaging studies:  Labs: Labs show the following:   Basic Metabolic Panel: Recent Labs  Lab 01/15/19 0711 01/15/19 1819 01/16/19 0516 01/17/19 0540 01/18/19 0515 01/19/19 0512  NA 139  --  136 137 136 136  K 3.1*  --  3.4* 3.8 3.7 3.5  CL 104  --  102 104 103 104  CO2 25  --  24 20* 20* 22  GLUCOSE 107*  --  105* 95 92 100*  BUN 12  --  13 14 11 10   CREATININE 0.62  --  0.83 0.74 0.64 0.59*  CALCIUM 7.7*  --  7.8* 7.9* 7.6* 7.4*  MG 1.2* 1.9 1.7 1.7 1.6* 1.7  PHOS  --   --  2.8  --   --   --  GFR Estimated Creatinine Clearance: 90.8 mL/min (A) (by C-G formula based on SCr of 0.59 mg/dL (L)). Liver Function Tests: Recent Labs  Lab 01/13/19 1208  AST 34  ALT 54*  ALKPHOS 151*  BILITOT 0.7  PROT 7.5  ALBUMIN 2.5*   No results for input(s): LIPASE, AMYLASE in the last 168 hours. No results for input(s): AMMONIA in the last 168 hours. Coagulation profile No results for input(s): INR, PROTIME in the last 168 hours.  CBC: Recent Labs  Lab 01/13/19 1208 01/15/19 0711 01/16/19 0516 01/17/19 0540 01/18/19 0515 01/19/19 0512  WBC 21.2* 17.1* 14.0* 16.1* 15.6* 15.8*  NEUTROABS 18.2*  --  11.6* 13.3* 13.1* 13.2*  HGB 13.2 11.8* 12.0* 12.3* 12.1* 11.8*  HCT 38.8* 35.2* 36.5* 35.5* 35.2* 35.4*  MCV 91.9 91.9 92.9 89.2 89.6 92.2  PLT 567* 475* 469* 477*  489* 504*   Cardiac Enzymes: No results for input(s): CKTOTAL, CKMB, CKMBINDEX, TROPONINI in the last 168 hours. BNP (last 3 results) No results for input(s): PROBNP in the last 8760 hours. CBG: No results for input(s): GLUCAP in the last 168 hours. D-Dimer: No results for input(s): DDIMER in the last 72 hours. Hgb A1c: No results for input(s): HGBA1C in the last 72 hours. Lipid Profile: No results for input(s): CHOL, HDL, LDLCALC, TRIG, CHOLHDL, LDLDIRECT in the last 72 hours. Thyroid function studies: No results for input(s): TSH, T4TOTAL, T3FREE, THYROIDAB in the last 72 hours.  Invalid input(s): FREET3 Anemia work up: No results for input(s): VITAMINB12, FOLATE, FERRITIN, TIBC, IRON, RETICCTPCT in the last 72 hours. Sepsis Labs: Recent Labs  Lab 01/16/19 0516 01/17/19 0540 01/18/19 0515 01/19/19 0512  WBC 14.0* 16.1* 15.6* 15.8*    Microbiology Recent Results (from the past 240 hour(s))  Blood culture (routine x 2)     Status: None   Collection Time: 01/13/19 12:08 PM   Specimen: BLOOD  Result Value Ref Range Status   Specimen Description BLOOD LEFT ANTECUBITAL  Final   Special Requests   Final    BOTTLES DRAWN AEROBIC AND ANAEROBIC Blood Culture adequate volume   Culture   Final    NO GROWTH 5 DAYS Performed at [facility], [address], [place], [place] [address]    Report Status 01/18/2019 FINAL  Final  Blood culture (routine x 2)     Status: None   Collection Time: 01/13/19 12:09 PM   Specimen: BLOOD  Result Value Ref Range Status   Specimen Description BLOOD RIGHT ANTECUBITAL  Final   Special Requests   Final    BOTTLES DRAWN AEROBIC AND ANAEROBIC Blood Culture adequate volume   Culture   Final    NO GROWTH 5 DAYS Performed at [facility], [address] [place], [place] [address]    Report Status 01/18/2019 FINAL  Final  SARS CORONAVIRUS 2 (TAT 6-24 HRS) Nasopharyngeal Nasopharyngeal Swab     Status: None   Collection Time:  01/13/19  1:52 PM   Specimen: Nasopharyngeal Swab  Result Value Ref Range Status   SARS Coronavirus 2 NEGATIVE NEGATIVE Final    Comment: (NOTE) SARS-CoV-2 target nucleic acids are NOT DETECTED. The SARS-CoV-2 RNA is generally detectable in upper and lower respiratory specimens during the acute phase of infection. Negative results do not preclude SARS-CoV-2 infection, do not rule out co-infections with other pathogens, and should not be used as the sole basis for treatment or other patient management decisions. Negative results must be combined with clinical observations, patient history, and epidemiological information.  The expected result is Negative. Fact Sheet for Patients: SugarRoll.be Fact Sheet for Healthcare Providers: https://www.woods-mathews.com/ This test is not yet approved or cleared by the [place] FDA and  has been authorized for detection and/or diagnosis of SARS-CoV-2 by FDA under an Emergency Use Authorization (EUA). This EUA will remain  in effect (meaning this test can be used) for the duration of the COVID-19 declaration under Section 56 4(b)(1) of the Act, 21 U.S.C. section 360bbb-3(b)(1), unless the authorization is terminated or revoked sooner. Performed at [facility], [place] [address] [place], [place] [address]   Body fluid culture     Status: None   Collection Time: 01/15/19 11:02 AM   Specimen: Pleura; Body Fluid  Result Value Ref Range Status   Specimen Description   Final    PLEURAL Performed at [facility], [address] [place], [place] [address]    Special Requests   Final    FLUID Performed at [facility], [address] [place], [place] [address]    Gram Stain   Final    FEW WBC PRESENT, PREDOMINANTLY PMN NO ORGANISMS SEEN    Culture   Final    NO GROWTH 3 DAYS Performed at [facility], [place] [address] [place], [place] [address]    Report Status  01/19/2019 FINAL  Final    Procedures and diagnostic studies:  Dg Chest 1 View  Result Date: 01/18/2019 CLINICAL DATA:  Empyema. EXAM: CHEST  1 VIEW COMPARISON:  01/17/2019.  CT 01/14/2019. FINDINGS: Left chest tube in stable position. No pneumothorax. Persistent density over the left upper chest, most likely loculated pleural fluid in the left major fissure as noted on prior recent CT. Bibasilar subsegmental atelectasis. Heart size stable. No acute bony abnormality. Mild left chest wall subcutaneous emphysema. IMPRESSION: 1. Left chest tube in stable position. No pneumothorax. Mild left chest wall subcutaneous emphysema. 2. Density noted left upper chest consistent with loculated pleural fluid in the left major fissure as noted on recent CT. No interim change. 3.  Mild bibasilar atelectasis. Electronically Signed   By: Marcello Moores  Register   On: 01/18/2019 07:13   Dg Chest 2 View  Result Date: 01/19/2019 CLINICAL DATA:  Loculated left pleural effusion. Chest tube placement. COPD. EXAM: CHEST - 2 VIEW COMPARISON:  01/18/2019 FINDINGS: Left pleural pigtail catheter remains in place. A small residual loculated hydropneumothorax is seen in the posterior left hemithorax along the course of the major fissure. Small free-flowing left pleural effusion is again seen. Airspace disease again seen in the central left upper lobe, without significant change. Right lung remains clear. Heart size is normal. Aortic atherosclerosis. IMPRESSION: 1. No significant change in small left pleural effusion, and small loculated left hydropneumothorax along the major fissure. 2. Stable central left upper lobe airspace disease. Electronically Signed   By: Marlaine Hind M.D.   On: 01/19/2019 08:26    Medications:   . amLODipine  10 mg Oral Daily  . baclofen  10 mg Oral QHS  . calcium carbonate  1 tablet Oral BID  . carvedilol  25 mg Oral BID WC  . docusate sodium  100 mg Oral BID  . ferrous sulfate  325 mg Oral Daily  .  losartan  100 mg Oral Daily  . omega-3 acid ethyl esters  1 g Oral Daily  . pantoprazole  40 mg Oral Daily  . potassium chloride  40 mEq Oral Once  . tamsulosin  0.4 mg Oral Daily  Continuous Infusions: . sodium chloride 75 mL/hr at 01/19/19 1108  . sodium chloride Stopped (01/18/19 2111)  . cefTRIAXone (ROCEPHIN)  IV Stopped (01/18/19 1553)  . magnesium sulfate bolus IVPB    . metronidazole 500 mg (01/19/19 0526)     LOS: 6 days   Jamani Eley  [facility]   *Please refer to [email or web address], password [email or web address] to get updated schedule on who will round on this patient, as hospitalists switch teams weekly. If 7PM-7AM, please contact night-coverage at [email or web address], password [email or web address] for any overnight needs.  01/19/2019, 12:34 PM

## 2019-01-20 ENCOUNTER — Inpatient Hospital Stay: Payer: Medicare Other | Admitting: Certified Registered"

## 2019-01-20 ENCOUNTER — Encounter
Admission: EM | Disposition: A | Discharge status: Home / Self Care | Payer: Self-pay | Source: Home / Self Care | Attending: Cardiothoracic Surgery

## 2019-01-20 ENCOUNTER — Inpatient Hospital Stay: Payer: Medicare Other

## 2019-01-20 DIAGNOSIS — J869 Pyothorax without fistula: Secondary | ICD-10-CM | POA: Diagnosis present

## 2019-01-20 HISTORY — PX: VIDEO ASSISTED THORACOSCOPY (VATS)/THOROCOTOMY: SHX6173

## 2019-01-20 HISTORY — PX: FLEXIBLE BRONCHOSCOPY: SHX5094

## 2019-01-20 HISTORY — PX: THORACOTOMY: SHX5074

## 2019-01-20 LAB — CBC WITH DIFFERENTIAL/PLATELET
Abs Immature Granulocytes: 0.11 10*3/uL — ABNORMAL HIGH (ref 0.00–0.07)
Basophils Absolute: 0 10*3/uL (ref 0.0–0.1)
Basophils Relative: 0 %
Eosinophils Absolute: 0.1 10*3/uL (ref 0.0–0.5)
Eosinophils Relative: 1 %
HCT: 34.6 % — ABNORMAL LOW (ref 39.0–52.0)
Hemoglobin: 11.5 g/dL — ABNORMAL LOW (ref 13.0–17.0)
Immature Granulocytes: 1 %
Lymphocytes Relative: 9 %
Lymphs Abs: 1 10*3/uL (ref 0.7–4.0)
MCH: 30.4 pg (ref 26.0–34.0)
MCHC: 33.2 g/dL (ref 30.0–36.0)
MCV: 91.5 fL (ref 80.0–100.0)
Monocytes Absolute: 1 10*3/uL (ref 0.1–1.0)
Monocytes Relative: 9 %
Neutro Abs: 9 10*3/uL — ABNORMAL HIGH (ref 1.7–7.7)
Neutrophils Relative %: 80 %
Platelets: 456 10*3/uL — ABNORMAL HIGH (ref 150–400)
RBC: 3.78 MIL/uL — ABNORMAL LOW (ref 4.22–5.81)
RDW: 14.1 % (ref 11.5–15.5)
WBC: 11.3 10*3/uL — ABNORMAL HIGH (ref 4.0–10.5)
nRBC: 0 % (ref 0.0–0.2)

## 2019-01-20 LAB — CBC
HCT: 31.7 % — ABNORMAL LOW (ref 39.0–52.0)
Hemoglobin: 10.4 g/dL — ABNORMAL LOW (ref 13.0–17.0)
MCH: 30.1 pg (ref 26.0–34.0)
MCHC: 32.8 g/dL (ref 30.0–36.0)
MCV: 91.9 fL (ref 80.0–100.0)
Platelets: 386 10*3/uL (ref 150–400)
RBC: 3.45 MIL/uL — ABNORMAL LOW (ref 4.22–5.81)
RDW: 14.4 % (ref 11.5–15.5)
WBC: 12.7 10*3/uL — ABNORMAL HIGH (ref 4.0–10.5)
nRBC: 0 % (ref 0.0–0.2)

## 2019-01-20 LAB — BASIC METABOLIC PANEL
Anion gap: 7 (ref 5–15)
Anion gap: 10 (ref 5–15)
BUN: 6 mg/dL — ABNORMAL LOW (ref 8–23)
BUN: 10 mg/dL (ref 8–23)
CO2: 24 mmol/L (ref 22–32)
CO2: 21 mmol/L — ABNORMAL LOW (ref 22–32)
Calcium: 7.3 mg/dL — ABNORMAL LOW (ref 8.9–10.3)
Calcium: 7.6 mg/dL — ABNORMAL LOW (ref 8.9–10.3)
Chloride: 106 mmol/L (ref 98–111)
Chloride: 103 mmol/L (ref 98–111)
Creatinine, Ser: 0.65 mg/dL (ref 0.61–1.24)
Creatinine, Ser: 0.68 mg/dL (ref 0.61–1.24)
GFR calc Af Amer: >60 mL/min (ref >60)
GFR calc Af Amer: >60 mL/min (ref >60)
GFR calc non Af Amer: >60 mL/min (ref >60)
GFR calc non Af Amer: >60 mL/min (ref >60)
Glucose, Bld: 94 mg/dL (ref 70–99)
Glucose, Bld: 145 mg/dL — ABNORMAL HIGH (ref 70–99)
Potassium: 3.6 mmol/L (ref 3.5–5.1)
Potassium: 4.6 mmol/L (ref 3.5–5.1)
Sodium: 137 mmol/L (ref 135–145)
Sodium: 134 mmol/L — ABNORMAL LOW (ref 135–145)

## 2019-01-20 LAB — GLUCOSE, CAPILLARY: Glucose-Capillary: 133 mg/dL — ABNORMAL HIGH (ref 70–99)

## 2019-01-20 LAB — MAGNESIUM: Magnesium: 1.6 mg/dL — ABNORMAL LOW (ref 1.7–2.4)

## 2019-01-20 LAB — MRSA PCR SCREENING: MRSA by PCR: NEGATIVE

## 2019-01-20 SURGERY — THORACOTOMY, MAJOR
Anesthesia: General | Laterality: Left

## 2019-01-20 MED ORDER — POTASSIUM CHLORIDE CRYS ER 20 MEQ PO TBCR
40.0000 meq | EXTENDED_RELEASE_TABLET | Freq: Once | ORAL | Status: AC
  Administered 2019-01-20: 09:00:00 40 meq via ORAL
  Filled 2019-01-20: qty 2

## 2019-01-20 MED ORDER — ALBUMIN HUMAN 5 % IV SOLN
INTRAVENOUS | Status: AC
  Filled 2019-01-20: qty 500

## 2019-01-20 MED ORDER — PROPOFOL 10 MG/ML IV BOLUS
INTRAVENOUS | Status: AC
  Filled 2019-01-20: qty 20

## 2019-01-20 MED ORDER — SEVOFLURANE IN SOLN
RESPIRATORY_TRACT | Status: AC
  Filled 2019-01-20: qty 250

## 2019-01-20 MED ORDER — ROCURONIUM BROMIDE 50 MG/5ML IV SOLN
INTRAVENOUS | Status: AC
  Filled 2019-01-20: qty 1

## 2019-01-20 MED ORDER — DEXAMETHASONE SODIUM PHOSPHATE 10 MG/ML IJ SOLN
INTRAMUSCULAR | Status: DC | PRN
  Administered 2019-01-20: 10 mg via INTRAVENOUS

## 2019-01-20 MED ORDER — LIDOCAINE HCL URETHRAL/MUCOSAL 2 % EX GEL
CUTANEOUS | Status: AC
  Filled 2019-01-20: qty 5

## 2019-01-20 MED ORDER — EPHEDRINE SULFATE 50 MG/ML IJ SOLN
INTRAMUSCULAR | Status: DC | PRN
  Administered 2019-01-20 (×4): 10 mg via INTRAVENOUS

## 2019-01-20 MED ORDER — SUGAMMADEX SODIUM 200 MG/2ML IV SOLN
INTRAVENOUS | Status: DC | PRN
  Administered 2019-01-20: 200 mg via INTRAVENOUS

## 2019-01-20 MED ORDER — OXYCODONE HCL 5 MG PO TABS: 5.0000 mg | ORAL_TABLET | Freq: Once | ORAL | Status: DC | PRN

## 2019-01-20 MED ORDER — LACTATED RINGERS IV SOLN
INTRAVENOUS | Status: DC | PRN
  Administered 2019-01-20 (×2): via INTRAVENOUS

## 2019-01-20 MED ORDER — FENTANYL CITRATE (PF) 250 MCG/5ML IJ SOLN
INTRAMUSCULAR | Status: AC
  Filled 2019-01-20: qty 5

## 2019-01-20 MED ORDER — ACETAMINOPHEN 10 MG/ML IV SOLN
INTRAVENOUS | Status: DC | PRN
  Administered 2019-01-20: 1000 mg via INTRAVENOUS

## 2019-01-20 MED ORDER — EPHEDRINE SULFATE 50 MG/ML IJ SOLN
INTRAMUSCULAR | Status: AC
  Filled 2019-01-20: qty 1

## 2019-01-20 MED ORDER — DEXTROSE-NACL 5-0.45 % IV SOLN
INTRAVENOUS | Status: DC
  Administered 2019-01-20 – 2019-01-21 (×2): via INTRAVENOUS

## 2019-01-20 MED ORDER — BISACODYL 5 MG PO TBEC
10.0000 mg | DELAYED_RELEASE_TABLET | Freq: Every day | ORAL | Status: DC
  Administered 2019-01-22: 10 mg via ORAL
  Filled 2019-01-20 (×3): qty 2

## 2019-01-20 MED ORDER — OXYCODONE HCL 5 MG PO TABS: 5.0000 mg | ORAL_TABLET | ORAL | Status: DC | PRN

## 2019-01-20 MED ORDER — PHENYLEPHRINE HCL (PRESSORS) 10 MG/ML IV SOLN
INTRAVENOUS | Status: DC | PRN
  Administered 2019-01-20 (×3): 100 ug via INTRAVENOUS

## 2019-01-20 MED ORDER — VASOPRESSIN 20 UNIT/ML IV SOLN
INTRAVENOUS | Status: DC | PRN
  Administered 2019-01-20: 1 [IU] via INTRAVENOUS
  Administered 2019-01-20: 2 [IU] via INTRAVENOUS
  Administered 2019-01-20 (×2): .5 [IU] via INTRAVENOUS
  Administered 2019-01-20: 1 [IU] via INTRAVENOUS
  Administered 2019-01-20: .5 [IU] via INTRAVENOUS
  Administered 2019-01-20 (×2): 1 [IU] via INTRAVENOUS
  Administered 2019-01-20: 2 [IU] via INTRAVENOUS
  Administered 2019-01-20 (×2): 1 [IU] via INTRAVENOUS
  Administered 2019-01-20: .5 [IU] via INTRAVENOUS

## 2019-01-20 MED ORDER — MORPHINE SULFATE (PF) 2 MG/ML IV SOLN: 1.0000 mg | INTRAVENOUS | Status: DC | PRN

## 2019-01-20 MED ORDER — FENTANYL CITRATE (PF) 100 MCG/2ML IJ SOLN
INTRAMUSCULAR | Status: AC
  Administered 2019-01-20: 19:00:00 25 ug via INTRAVENOUS
  Filled 2019-01-20: qty 2

## 2019-01-20 MED ORDER — ONDANSETRON HCL 4 MG/2ML IJ SOLN
INTRAMUSCULAR | Status: DC | PRN
  Administered 2019-01-20: 4 mg via INTRAVENOUS

## 2019-01-20 MED ORDER — VASOPRESSIN 20 UNIT/ML IV SOLN
INTRAVENOUS | Status: AC
  Filled 2019-01-20: qty 1

## 2019-01-20 MED ORDER — TRAMADOL HCL 50 MG PO TABS
100.0000 mg | ORAL_TABLET | Freq: Four times a day (QID) | ORAL | Status: DC
  Administered 2019-01-20 – 2019-01-24 (×13): 100 mg via ORAL
  Filled 2019-01-20 (×15): qty 2

## 2019-01-20 MED ORDER — LIDOCAINE HCL (CARDIAC) PF 100 MG/5ML IV SOSY
PREFILLED_SYRINGE | INTRAVENOUS | Status: DC | PRN
  Administered 2019-01-20: 100 mg via INTRAVENOUS

## 2019-01-20 MED ORDER — ROCURONIUM BROMIDE 50 MG/5ML IV SOLN
INTRAVENOUS | Status: AC
  Filled 2019-01-20: qty 2

## 2019-01-20 MED ORDER — DEXAMETHASONE SODIUM PHOSPHATE 10 MG/ML IJ SOLN
INTRAMUSCULAR | Status: AC
  Filled 2019-01-20: qty 1

## 2019-01-20 MED ORDER — MEPERIDINE HCL 50 MG/ML IJ SOLN: 6.2500 mg | INTRAMUSCULAR | Status: DC | PRN

## 2019-01-20 MED ORDER — ONDANSETRON HCL 4 MG/2ML IJ SOLN: 4.0000 mg | Freq: Four times a day (QID) | INTRAMUSCULAR | Status: DC | PRN

## 2019-01-20 MED ORDER — OXYCODONE HCL 5 MG/5ML PO SOLN: 5.0000 mg | Freq: Once | ORAL | Status: DC | PRN

## 2019-01-20 MED ORDER — ALBUMIN HUMAN 5 % IV SOLN
INTRAVENOUS | Status: DC | PRN
  Administered 2019-01-20 (×2): via INTRAVENOUS

## 2019-01-20 MED ORDER — SODIUM CHLORIDE 0.9 % IV SOLN
INTRAVENOUS | Status: DC | PRN
  Administered 2019-01-20: 50 ug/min via INTRAVENOUS

## 2019-01-20 MED ORDER — PROPOFOL 10 MG/ML IV BOLUS
INTRAVENOUS | Status: DC | PRN
  Administered 2019-01-20: 150 mg via INTRAVENOUS

## 2019-01-20 MED ORDER — CALCIUM CHLORIDE 10 % IV SOLN
INTRAVENOUS | Status: AC
  Filled 2019-01-20: qty 10

## 2019-01-20 MED ORDER — ROCURONIUM BROMIDE 100 MG/10ML IV SOLN
INTRAVENOUS | Status: DC | PRN
  Administered 2019-01-20 (×2): 10 mg via INTRAVENOUS
  Administered 2019-01-20: 50 mg via INTRAVENOUS
  Administered 2019-01-20 (×3): 10 mg via INTRAVENOUS

## 2019-01-20 MED ORDER — ALBUTEROL SULFATE (2.5 MG/3ML) 0.083% IN NEBU: 2.5000 mg | INHALATION_SOLUTION | RESPIRATORY_TRACT | Status: DC

## 2019-01-20 MED ORDER — CHLORHEXIDINE GLUCONATE CLOTH 2 % EX PADS
6.0000 | MEDICATED_PAD | Freq: Every day | CUTANEOUS | Status: DC
  Administered 2019-01-23 – 2019-01-25 (×3): 6 via TOPICAL

## 2019-01-20 MED ORDER — MAGNESIUM SULFATE 4 GM/100ML IV SOLN
4.0000 g | Freq: Once | INTRAVENOUS | Status: AC
  Administered 2019-01-20: 4 g via INTRAVENOUS
  Filled 2019-01-20: qty 100

## 2019-01-20 MED ORDER — CALCIUM CHLORIDE 10 % IV SOLN
INTRAVENOUS | Status: DC | PRN
  Administered 2019-01-20: 200 mg via INTRAVENOUS
  Administered 2019-01-20: 300 mg via INTRAVENOUS

## 2019-01-20 MED ORDER — ACETAMINOPHEN 10 MG/ML IV SOLN
INTRAVENOUS | Status: AC
  Filled 2019-01-20: qty 100

## 2019-01-20 MED ORDER — BUPIVACAINE LIPOSOME 1.3 % IJ SUSP
INTRAMUSCULAR | Status: DC | PRN
  Administered 2019-01-20: 50 mL

## 2019-01-20 MED ORDER — PROMETHAZINE HCL 25 MG/ML IJ SOLN: 6.2500 mg | INTRAMUSCULAR | Status: DC | PRN

## 2019-01-20 MED ORDER — FENTANYL CITRATE (PF) 100 MCG/2ML IJ SOLN
INTRAMUSCULAR | Status: DC | PRN
  Administered 2019-01-20: 50 ug via INTRAVENOUS
  Administered 2019-01-20 (×2): 25 ug via INTRAVENOUS
  Administered 2019-01-20: 50 ug via INTRAVENOUS

## 2019-01-20 MED ORDER — FENTANYL CITRATE (PF) 100 MCG/2ML IJ SOLN
25.0000 ug | INTRAMUSCULAR | Status: DC | PRN
  Administered 2019-01-20 (×4): 25 ug via INTRAVENOUS

## 2019-01-20 SURGICAL SUPPLY — 94 items
BENZOIN TINCTURE PRP APPL 2/3 (GAUZE/BANDAGES/DRESSINGS) ×2 IMPLANT
BNDG COHESIVE 4X5 TAN STRL (GAUZE/BANDAGES/DRESSINGS) ×1 IMPLANT
BRONCHOSCOPE PED SLIM DISP (MISCELLANEOUS) ×2 IMPLANT
CANISTER SUCT 1200ML W/VALVE (MISCELLANEOUS) ×2 IMPLANT
CATH URET ROBINSON 16FR STRL (CATHETERS) ×1 IMPLANT
CHLORAPREP W/TINT 26 (MISCELLANEOUS) ×4 IMPLANT
CNTNR SPEC 2.5X3XGRAD LEK (MISCELLANEOUS) ×4
CONN REDUCER 1/4X3/8 STR (CONNECTOR) ×8
CONN REDUCER 3/8X3/8X3/8Y (CONNECTOR) ×6
CONNECTOR REDUCER 1/4X3/8 STR (CONNECTOR) IMPLANT
CONNECTOR REDUCER 3/8X3/8 (MISCELLANEOUS) IMPLANT
CONNECTOR REDUCER 3/8X3/8X3/8Y (CONNECTOR) IMPLANT
CONT SPEC 4OZ STER OR WHT (MISCELLANEOUS) ×4
CONTAINER SPEC 2.5X3XGRAD LEK (MISCELLANEOUS) ×4 IMPLANT
CUTTER ECHEON FLEX ENDO 45 340 (ENDOMECHANICALS) ×1 IMPLANT
DEFOGGER SCOPE WARMER CLEARIFY (MISCELLANEOUS) ×1 IMPLANT
DRAIN CHANNEL 19F RND (DRAIN) IMPLANT
DRAIN CHANNEL 28F RND 3/8 FF (WOUND CARE) ×1 IMPLANT
DRAIN CHANNEL 32F RND 10.7 FF (WOUND CARE) ×3 IMPLANT
DRAIN CHEST DRY SUCT SGL (MISCELLANEOUS) ×3 IMPLANT
DRAPE C-SECTION (MISCELLANEOUS) ×2 IMPLANT
DRAPE MAG INST 16X20 L/F (DRAPES) ×2 IMPLANT
DRAPE POUCH INSTRU U-SHP 10X18 (DRAPES) ×2 IMPLANT
DRSG OPSITE POSTOP 3X4 (GAUZE/BANDAGES/DRESSINGS) ×3 IMPLANT
DRSG OPSITE POSTOP 4X12 (GAUZE/BANDAGES/DRESSINGS) ×1 IMPLANT
DRSG OPSITE POSTOP 4X6 (GAUZE/BANDAGES/DRESSINGS) ×1 IMPLANT
DRSG OPSITE POSTOP 4X8 (GAUZE/BANDAGES/DRESSINGS) ×1 IMPLANT
DRSG TELFA 3X8 NADH (GAUZE/BANDAGES/DRESSINGS) ×2 IMPLANT
ELECT BLADE 6 FLAT ULTRCLN (ELECTRODE) ×2 IMPLANT
ELECT BLADE 6.5 EXT (BLADE) ×2 IMPLANT
ELECT CAUTERY BLADE TIP 2.5 (TIP) ×2
ELECT REM PT RETURN 9FT ADLT (ELECTROSURGICAL) ×2
ELECTRODE CAUTERY BLDE TIP 2.5 (TIP) ×1 IMPLANT
ELECTRODE REM PT RTRN 9FT ADLT (ELECTROSURGICAL) ×1 IMPLANT
GAUZE SPONGE 4X4 12PLY STRL (GAUZE/BANDAGES/DRESSINGS) ×2 IMPLANT
GLOVE SURG SYN 7.5  E (GLOVE) ×2
GLOVE SURG SYN 7.5 E (GLOVE) ×2 IMPLANT
GLOVE SURG SYN 7.5 PF PI (GLOVE) ×2 IMPLANT
GOWN STRL REUS W/ TWL LRG LVL3 (GOWN DISPOSABLE) ×4 IMPLANT
GOWN STRL REUS W/TWL LRG LVL3 (GOWN DISPOSABLE) ×4
HANDLE YANKAUER SUCT BULB TIP (MISCELLANEOUS) ×1 IMPLANT
KIT TURNOVER KIT A (KITS) ×2 IMPLANT
LABEL OR SOLS (LABEL) ×2 IMPLANT
LOOP RED MAXI  1X406MM (MISCELLANEOUS)
LOOP VESSEL MAXI 1X406 RED (MISCELLANEOUS) ×1 IMPLANT
MARKER SKIN DUAL TIP RULER LAB (MISCELLANEOUS) ×2 IMPLANT
NDL SPNL 20GX3.5 QUINCKE YW (NEEDLE) ×1 IMPLANT
NDL SPNL 22GX3.5 QUINCKE BK (NEEDLE) ×1 IMPLANT
NEEDLE SPNL 20GX3.5 QUINCKE YW (NEEDLE) ×2 IMPLANT
NEEDLE SPNL 22GX3.5 QUINCKE BK (NEEDLE) ×2 IMPLANT
NS IRRIG 500ML POUR BTL (IV SOLUTION) ×2 IMPLANT
PACK BASIN MAJOR ARMC (MISCELLANEOUS) ×2 IMPLANT
PAD DRESSING TELFA 3X8 NADH (GAUZE/BANDAGES/DRESSINGS) ×1 IMPLANT
RELOAD PROXIMATE TA60MM GREEN (ENDOMECHANICALS) IMPLANT
RELOAD STAPLE 35X2.5 WHT THIN (STAPLE) ×4 IMPLANT
RELOAD STAPLE 60 4.7 GRN THCK (ENDOMECHANICALS) IMPLANT
RELOAD STAPLER LINE PROX 30 GR (STAPLE) IMPLANT
SCISSORS METZENBAUM CVD 33 (INSTRUMENTS) IMPLANT
SPONGE KITTNER 5P (MISCELLANEOUS) ×8 IMPLANT
STAPLE RELOAD 2.5MM WHITE (STAPLE) IMPLANT
STAPLER RELOAD LINE PROX 30 GR (STAPLE)
STAPLER RELOADABLE 30 GRN THCK (STAPLE) ×1 IMPLANT
STAPLER SKIN PROX 35W (STAPLE) ×2 IMPLANT
STAPLER VASCULAR ECHELON 35 (CUTTER) ×1 IMPLANT
STRIP CLOSURE SKIN 1/2X4 (GAUZE/BANDAGES/DRESSINGS) ×2 IMPLANT
SUCTION FRAZIER HANDLE 10FR (MISCELLANEOUS) ×1
SUCTION TUBE FRAZIER 10FR DISP (MISCELLANEOUS) IMPLANT
SUT CHROMIC 3 0 SH 27 (SUTURE) ×6 IMPLANT
SUT ETHILON 4-0 (SUTURE)
SUT ETHILON 4-0 FS2 18XMFL BLK (SUTURE)
SUT MNCRL AB 3-0 PS2 27 (SUTURE) ×2 IMPLANT
SUT PROLENE 1 CT (SUTURE) ×8 IMPLANT
SUT PROLENE 5 0 RB 1 DA (SUTURE) IMPLANT
SUT SILK 0 (SUTURE) ×1
SUT SILK 0 30XBRD TIE 6 (SUTURE) ×1 IMPLANT
SUT SILK 1 SH (SUTURE) ×8 IMPLANT
SUT VIC AB 0 CT1 36 (SUTURE) ×4 IMPLANT
SUT VIC AB 2-0 CT1 27 (SUTURE) ×1
SUT VIC AB 2-0 CT1 TAPERPNT 27 (SUTURE) ×2 IMPLANT
SUT VIC AB 2-0 CT2 27 (SUTURE) ×3 IMPLANT
SUT VICRYL 2 TP 1 (SUTURE) ×8 IMPLANT
SUTURE ETHLN 4-0 FS2 18XMF BLK (SUTURE) IMPLANT
SYR 10ML SLIP (SYRINGE) ×2 IMPLANT
SYR 20ML LL LF (SYRINGE) ×2 IMPLANT
SYR BULB IRRIG 60ML STRL (SYRINGE) ×2 IMPLANT
TAPE CLOTH 3X10 WHT NS LF (GAUZE/BANDAGES/DRESSINGS) ×2 IMPLANT
TAPE TRANSPORE STRL 2 31045 (GAUZE/BANDAGES/DRESSINGS) ×2 IMPLANT
TRAP SPECIMEN MUCOUS 40CC (MISCELLANEOUS) ×1 IMPLANT
TRAY FOLEY MTR SLVR 16FR STAT (SET/KITS/TRAYS/PACK) ×2 IMPLANT
TROCAR FLEXIPATH 20X80 (ENDOMECHANICALS) ×1 IMPLANT
TROCAR FLEXIPATH THORACIC 15MM (ENDOMECHANICALS) IMPLANT
TUBING CONNECTING 10 (TUBING) ×2 IMPLANT
WATER STERILE IRR 1000ML POUR (IV SOLUTION) ×3 IMPLANT
YANKAUER SUCT BULB TIP FLEX NO (MISCELLANEOUS) ×4 IMPLANT

## 2019-01-20 NOTE — Interval H&P Note (Signed)
History and Physical Interval Note:  01/20/2019 12:21 PM  Jonathan Burns  has presented today for surgery, with the diagnosis of Empyema.  The various methods of treatment have been discussed with the patient and family. After consideration of risks, benefits and other options for treatment, the patient has consented to  Procedure(s): FLEXIBLE BRONCHOSCOPY (Left) VIDEO ASSISTED THORACOSCOPY (VATS)/THOROCOTOMY (Left) POSSIBLE THORACOTOMY MAJOR (Left) as a surgical intervention.  The patient's history has been reviewed, patient examined, no change in status, stable for surgery.  I have reviewed the patient's chart and labs.  Questions were answered to the patient's satisfaction.     Nestor Lewandowsky

## 2019-01-20 NOTE — Anesthesia Post-op Follow-up Note (Signed)
Anesthesia QCDR form completed.        

## 2019-01-20 NOTE — Transfer of Care (Signed)
Immediate Anesthesia Transfer of Care Note  Patient: Jonathan Burns  Procedure(s) Performed: FLEXIBLE BRONCHOSCOPY (Left ) VIDEO ASSISTED THORACOSCOPY (VATS)/THOROCOTOMY (Left ) CONVERTED TO THORACOTOMY MAJOR (Left )  Patient Location: PACU  Anesthesia Type:General  Level of Consciousness: drowsy and patient cooperative  Airway & Oxygen Therapy: Patient Spontanous Breathing and Patient connected to face mask oxygen  Post-op Assessment: Report given to RN and Post -op Vital signs reviewed and stable  Post vital signs: Reviewed and stable  Last Vitals:  Vitals Value Taken Time  BP 150/110 01/20/19 1800  Temp    Pulse 78 01/20/19 1800  Resp 18 01/20/19 1800  SpO2 97 % 01/20/19 1800  Vitals shown include unvalidated device data.  Last Pain:  Vitals:   01/20/19 1120  TempSrc: Oral  PainSc:          Complications: No apparent anesthesia complications

## 2019-01-20 NOTE — Consult Note (Signed)
PHARMACY CONSULT NOTE - FOLLOW UP  Pharmacy Consult for Electrolyte Monitoring and Replacement   Recent Labs: Potassium (mmol/L)  Date Value  01/20/2019 3.6   Magnesium (mg/dL)  Date Value  01/20/2019 1.6 (L)   Calcium (mg/dL)  Date Value  01/20/2019 7.3 (L)   Albumin (g/dL)  Date Value  01/13/2019 2.5 (L)  01/06/2019 2.9 (L)   Phosphorus (mg/dL)  Date Value  01/16/2019 2.8   Sodium (mmol/L)  Date Value  01/20/2019 137  01/12/2019 138   Corrected Ca: 8.5 mg/dL  Assessment: 73 y.o. male with left pleural effusion. Pharmacy has been consulted for potassium management.  Goal of Therapy:  Electrolytes WNL   Plan:   Magnesium has decreased further despite replacement with magnesium 2 gram IV x 1 on 12/1: replace with 4 grams IV magnesium sulfate  Potassium borderline low: replace with oral KCl 40 mEq x 1  f/u electrolytes with am labs  Dallie Piles ,PharmD Clinical Pharmacist 01/20/2019 7:09 AM

## 2019-01-20 NOTE — Consult Note (Signed)
Name: Jonathan Burns MRN: [id number] DOB: [birth year]    ADMISSION DATE:  01/13/2019 CONSULTATION DATE: 01/20/2019  REFERRING MD : Dr. Genevive Bi  CHIEF COMPLAINT: Left Flank Pain   BRIEF PATIENT DESCRIPTION: 73 yo male admitted to the medsurg unit with left pleural effusion/empyema and LLL abscess despite multiple tPA instillations pleural effusion unchanged left thoracotomy; decortication of visceral and parietal pleural; and intrapleural drainage of left lower lobe abscess with chest tube placement transferred to ICU postop for closer monitoring   SIGNIFICANT EVENTS/STUDIES:  11/25: CT Abd Pelvis revealed Partially visualized loculated appearing moderate-sized left pleural effusion with findings concerning for empyema. Clinical correlation is recommended. Fatty liver. Cholelithiasis. Sigmoid diverticulosis. No bowel obstruction or active inflammation. Normal appendix. Aortic Atherosclerosis 11/25: Pt admitted to the medsurg unit due to abnormal CT Abd Pelvis findings per Cardiothoracic Surgeon recommendations and General Surgery consulted  11/27: CT guided left chest tube placed per IR, Per general surgery tPA administered via catheter  12/2: Despite multiple tPA instillations left pleural effusion remained unchanged, cardiothoracic surgery proceeded with left thoracotomy; decortication of visceral and parietal pleural; and intrapleural drainage of left lower lobe abscess with placement of 4 chest tubes. Pt admitted to ICU postop PCCM consulted for additional management    HISTORY OF PRESENT ILLNESS:  This is a 73 yo male with a PMH of PUD, Adenocarcinoma of the Prostate s/p Radiation Therapy, Peptic Ulcer Disease, HTN, HLD, COPD, GI Bleed, BPH, and Anemia.  He presented to [facility] ER on 11/25 with c/o intermitted left flank pain, onset of symptoms 10 weeks prior to ER presentation.  Due to symptoms pt saw his PCP and routine labs obtained, which revealed an elevated creatinine and leukocytosis.  Pt  prescribed augment on 11/18, however repeat wbc increased from 20,000 to 22,000 PCP ordered stat CT Abd Pelvis.  CT Abd Pelvis concerning for loculated fluid accumulation in the left pleural space worrisome for empyema, pt instructed to proceed to the ER for further evaluation per PCP.  In the ER lab results revealed wbc 21.2, pt received ceftriaxone and flagyl.  ER physician spoke with cardiothoracic surgeon who recommended hospital admission.  Pt subsequently admitted to the [facility] unit for additional workup and treatment.  Detailed hospital course listed above under significant events.  PAST MEDICAL HISTORY :   has a past medical history of Anemia, BPH (benign prostatic hypertrophy), Cancer ([place]), Chronic GI bleeding, COPD (chronic obstructive pulmonary disease) ([place]), Hyperlipidemia, Hypertension, Prostate nodule, PUD (peptic ulcer disease), and Rising PSA level.  has a past surgical history that includes Colonoscopy (2015) and Esophagogastroduodenoscopy (2015). Prior to Admission medications   Medication Sig Start Date End Date Taking? Authorizing Provider  amLODipine (NORVASC) 10 MG tablet Take 1 tablet (10 mg total) by mouth daily. 08/06/18  Yes Crissman, Jeannette How, MD  amoxicillin-clavulanate (AUGMENTIN) 875-125 MG tablet Take 1 tablet by mouth 2 (two) times daily. 01/06/19  Yes Johnson, Megan P, DO  baclofen (LIORESAL) 10 MG tablet Take 1 tablet (10 mg total) by mouth at bedtime. 01/06/19  Yes Johnson, Megan P, DO  carvedilol (COREG) 25 MG tablet Take 1 tablet (25 mg total) by mouth 2 (two) times daily with a meal. 08/06/18  Yes Crissman, Jeannette How, MD  Ferrous Sulfate (IRON) 325 (65 FE) MG TABS Take 1 tablet by mouth daily. 09/07/14  Yes Crissman, Jeannette How, MD  hydrochlorothiazide (HYDRODIURIL) 25 MG tablet Take 1 tablet (25 mg total) by mouth daily. 08/06/18  Yes Guadalupe Maple, MD  losartan (COZAAR)  100 MG tablet Take 1 tablet (100 mg total) by mouth daily. 08/06/18  Yes Crissman, Jeannette How, MD   meloxicam (MOBIC) 15 MG tablet Take 1 tablet (15 mg total) by mouth daily. 08/06/18  Yes Crissman, Jeannette How, MD  Multiple Vitamins-Minerals (MENS MULTIVITAMIN PLUS PO) Take 1 tablet by mouth daily.    Yes [provider]  Omega-3 Fatty Acids (FISH OIL) 1000 MG CAPS Take 1,000 mg by mouth daily.    Yes [provider]  pantoprazole (PROTONIX) 40 MG tablet Take 1 tablet (40 mg total) by mouth daily. 08/06/18  Yes Crissman, Jeannette How, MD  tamsulosin (FLOMAX) 0.4 MG CAPS capsule Take 1 capsule (0.4 mg total) by mouth daily. 08/06/18  Yes Crissman, Jeannette How, MD   No Known Allergies  FAMILY HISTORY:  family history includes Aneurysm in his father; Cancer in his mother; Diabetes in his mother. SOCIAL HISTORY:  reports that he quit smoking about 32 years ago. His smoking use included cigarettes. He has a 30.00 pack-year smoking history. He has never used smokeless tobacco. He reports current alcohol use. He reports that he does not use drugs.  REVIEW OF SYSTEMS: Positives in BOLD  Constitutional: Negative for fever, chills, weight loss, malaise/fatigue and diaphoresis.  HENT: Negative for hearing loss, ear pain, nosebleeds, congestion, sore throat, neck pain, tinnitus and ear discharge.   Eyes: Negative for blurred vision, double vision, photophobia, pain, discharge and redness.  Respiratory: Negative for cough, hemoptysis, sputum production, shortness of breath, wheezing and stridor.   Cardiovascular: left sided chest pain at chest tube incision sites, palpitations, orthopnea, claudication, leg swelling and PND.  Gastrointestinal: Negative for heartburn, nausea, vomiting, abdominal pain, diarrhea, constipation, blood in stool and melena.  Genitourinary: Negative for dysuria, urgency, frequency, hematuria and flank pain.  Musculoskeletal: Negative for myalgias, back pain, joint pain and falls.  Skin: Negative for itching and rash.  Neurological: Negative for dizziness, tingling, tremors,  sensory change, speech change, focal weakness, seizures, loss of consciousness, weakness and headaches.  Endo/Heme/Allergies: Negative for environmental allergies and polydipsia. Does not bruise/bleed easily.  SUBJECTIVE:  No complaints at this time states pain 5/10 but does not need pain medication at this time   VITAL SIGNS: Temp:  [96.8 F (36 C)-99.8 F (37.7 C)] 96.8 F (36 C) (12/02 1800) Pulse Rate:  [77-103] 79 (12/02 1910) Resp:  [17-35] 33 (12/02 1910) BP: (93-150)/(54-110) 95/56 (12/02 1910) SpO2:  [91 %-97 %] 93 % (12/02 1910)  PHYSICAL EXAMINATION: General: well developed, well nourished male, NAD  Neuro: alert and oriented, follows commands  HEENT: supple, no JVD  Cardiovascular: nsr with depressed T wave, no R/G, 4 left sided chest tubes with serosanguinous drainage (2 Y connected chest tubes) Lungs: rhonchi throughout, even, non labored  Abdomen: +BS x4, obese, soft, non tender, non distended  Musculoskeletal: normal bulk and tone, no edema  Skin: 2 left flank chest tube incision sites dressings dry and intact; left flank incision with honeycomb dressing intact   Recent Labs  Lab 01/18/19 0515 01/19/19 0512 01/20/19 0428  NA 136 136 137  K 3.7 3.5 3.6  CL 103 104 106  CO2 20* 22 24  BUN 11 10 6*  CREATININE 0.64 0.59* 0.65  GLUCOSE 92 100* 94   Recent Labs  Lab 01/18/19 0515 01/19/19 0512 01/20/19 0428  HGB 12.1* 11.8* 11.5*  HCT 35.2* 35.4* 34.6*  WBC 15.6* 15.8* 11.3*  PLT 489* 504* 456*   Dg Chest 2 View  Result Date: 01/19/2019  CLINICAL DATA:  Loculated left pleural effusion. Chest tube placement. COPD. EXAM: CHEST - 2 VIEW COMPARISON:  01/18/2019 FINDINGS: Left pleural pigtail catheter remains in place. A small residual loculated hydropneumothorax is seen in the posterior left hemithorax along the course of the major fissure. Small free-flowing left pleural effusion is again seen. Airspace disease again seen in the central left upper lobe,  without significant change. Right lung remains clear. Heart size is normal. Aortic atherosclerosis. IMPRESSION: 1. No significant change in small left pleural effusion, and small loculated left hydropneumothorax along the major fissure. 2. Stable central left upper lobe airspace disease. Electronically Signed   By: Marlaine Hind M.D.   On: 01/19/2019 08:26   Dg Chest Port 1 View  Result Date: 01/20/2019 CLINICAL DATA:  Empyema. Left lower lobe abscess. EXAM: PORTABLE CHEST 1 VIEW 6:03 p.m. COMPARISON:  01/19/2019 FINDINGS: There are 3 new left-sided chest tubes in place. The empyema and left lower lobe abscess have been drained. No appreciable pneumothorax. Pleural thickening noted laterally. The patient has a new small right pleural effusion. Heart size is normal. Pulmonary vascularity at the upper limits of normal. Aortic atherosclerosis. No acute bone abnormality. Small amount of subcutaneous emphysema on the left. IMPRESSION: Three left chest tubes in place without pneumothorax. Lateral pleural thickening and minimal atelectasis at the left lung base. New small right pleural effusion. Electronically Signed   By: Lorriane Shire M.D.   On: 01/20/2019 18:32    ASSESSMENT / PLAN:  Left pleural effusion/empyema and LLL abscess transferred to ICU s/p left thoracotomy; decortication of visceral and parietal pleural; and intrapleural drainage of left lower lobe abscess Hx: COPD Trend WBC and monitor fever curve Trend PCT  Follow cultures  Continue ceftriaxone and flagyl Left sided chest tubes to chest drainage system with suction @20  cm Cardiothoracic surgery primary and General surgery consulted appreciate input  Supplemental O2 for dyspnea and/or hypoxia  Prn bronchodilator therapy  Pulmonary hygiene Continue morphine, oxycodone, and tramadol for pain management  Mild hypotension likely secondary to sedating medication  Hx: HTN and HLD Continuous telemetry monitoring  Will hold outpatient  antihypertensives for now   Hypomagnesia  Trend BMP  Replete electrolytes as indicated  Monitor UOP   Chronic anemia  VTE px: SCD's for now Trend CBC Monitor for s/sx of bleeding and transfuse for hgb <7  Peptic ulcer disease Continue outpatient protonix   Marda Stalker, [place] Pager [phone] (please enter 7 digits) PCCM Consult Pager [phone] (please enter 7 digits)

## 2019-01-20 NOTE — Op Note (Signed)
01/20/2019  6:10 PM  PATIENT:  Jonathan Burns  73 y.o. male  PRE-OPERATIVE DIAGNOSIS: Empyema left chest  POST-OPERATIVE DIAGNOSIS: Empyema left chest with left lower lobe abscess  PROCEDURE: Left thoracoscopy with conversion to left thoracotomy; decortication of visceral and parietal pleura; intrapleural drainage of left lower lobe abscess  SURGEON:  Surgeon(s) and Role:    Nestor Lewandowsky, MD - Primary     ASSISTANTS: Loree Fee, PA  ANESTHESIA: General anesthesia  INDICATIONS FOR PROCEDURE this patient is a 73 year old gentleman with a left empyema.  He had failed conservative management and he was offered the above named procedure for definitive diagnosis and treatment.  The indications and risks were explained the patient gave his informed consent.  DICTATION: Patient was brought to the operating suite and placed in the supine position.  General endotracheal anesthesia was given through a double-lumen tube.  Preoperative bronchoscopy was carried out by our anesthesiologist.  The tube was secured in the proper place.  The patient was then turned for left thoracoscopy.  All pressure points were carefully padded.  The patient was prepped and draped in usual sterile fashion.  The previous chest tube have been removed.  We began by making a small incision just lateral to the previous chest tube insertion site.  We attempted to enter the pleural space but the pleural space was completely obliterated in this location.  We therefore elected to convert to an open thoracotomy.  Our incision was extended back towards the tip of the scapula and a standard posterior lateral sixth interspace incision was created.  It took a considerable period of time to free the lung up off the chest wall but ultimately were able to do so and encountered a large empyema cavity posteriorly.  Appropriate aliquots of this material were sent for culture.  Once the entire lung was freed up off the ribs we then placed 2  small to VA retractors.  These were then opened slowly as we slowly gained additional access to the chest.  Once the end entire incision was opened we were then able to completely visualize the left pleural space.  The left lower lobe was densely consolidated.  There was a 2 cm lung abscess that had ruptured into the pleural space.  This abscess was surrounded by necrotic lung.  We debrided this and sent appropriate cultures and pathology from the lung abscess itself.  A complete decortication was then performed to the visceral parietal pleura.  The area around the abscess could not be completely decorticated despite multiple attempts to enter the correct plane.  I therefore used electrocautery and produced a hatched scarring on the visceral pleura to allow for the underlying lung to reexpand.  This is similar to an escharotomy.  The entire pleural space was irrigated on multiple occasions and hemostasis was complete.  There were several small air leaks that were created during our mobilization of the lung and I attempted to reapproximate some of these edges with 3-0 chromic.  We then placed 4 chest tubes.  A 28 was placed along the diaphragmatic surface and brought out anteriorly.  332 Blakes were then positioned.  The first 38 Keenan Bachelor was positioned anteriorly the second Keenan Bachelor was positioned posteriorly and the third Keenan Bachelor was positioned along the left lower lobe and secured to the lung abscess cavity.  The chest was again irrigated and a thoracoscope was used to visualize the chest tube sites which ensure there are hemostasis.  We then closed the chest  in standard fashion.  #2 Vicryl pericostal sutures were used to approximate the ribs.  Exparel was then used in the standard fashion attempting to perform multiple intercostal nerve blocks.  The latissimus and serratus muscles were closed with #2 Vicryl.  The subcutaneous tissues with 2-0 Vicryl and the skin with skin clips.  The chest tubes were then secured prior  to closure with Prolene sutures.  Sterile dressings were applied.  All sponge needle and instrument counts were correct as reported to me at the end of the case.  The patient was then rolled in the supine position where he was extubated and taken to the recovery room in stable condition.   Nestor Lewandowsky, MD

## 2019-01-20 NOTE — Progress Notes (Signed)
Progress Note    Jonathan Burns  KGU:[id number] DOB: 1945/09/06  DOA: 01/13/2019 PCP: Valerie Roys, DO      Brief Narrative:    Medical records reviewed and are as summarized below:  Jonathan Burns is an 73 y.o. male with history of adenocarcinoma of the prostate status post radiation therapy, peptic ulcer disease, COPD, hyperlipidemia, hypertension, presented to the hospital with intermittent left flank pain OF about 7 to 10 days duration.  He was found to have left-sided empyema requiring drainage via chest tube      Assessment/Plan:   Active Problems:   Empyema of left pleural space (HCC)   Hypokalemia   Body mass index is 31.93 kg/m.   Left pleural effusion/empyema: He has been getting intrapleural tPA since 01/15/2019 s/p chest tube placement on 01/15/2019.  Continue IV Rocephin and Flagyl.   Appreciate CT surgery assistance. Patient was taken to the OR underwent left thoracoscopy which was later on converted to left thoracotomy and decortication of the visceral and parietal pleura as well as drainage of the left lower lobe abscess. Follow-up on the cultures. Continue with the antibiotics. We will discuss with ID regarding further input.  Leukocytosis: Overall improved but persistent. This is likely due to empyema.  The CBC.  Hypertension: Continue antihypertensives  Hypokalemia: Improved.  Replete potassium  GERD/history of peptic ulcer disease: Continue Protonix  History of adenocarcinoma of the prostate stage III B: s/p radiation therapy: Continue Flomax       Family Communication/Anticipated D/C date and plan/Code Status   DVT prophylaxis: Lovenox Code Status: Full code Family Communication: Plan discussed with the patient and his daughter at the bedside Disposition Plan: Unknown at this time      Subjective:   No acute events overnight.  Objective:    Vitals:   01/20/19 1810 01/20/19 1825 01/20/19 1840 01/20/19 1855  BP: (!)  99/57 (!) 98/54 (!) 93/56 96/62  Pulse: 78 79 80 78  Resp: (!) 33 (!) 22 (!) 35 (!) 25  Temp:      TempSrc:      SpO2: 95% 96% 92% 95%  Weight:      Height:        Intake/Output Summary (Last 24 hours) at 01/20/2019 1910 Last data filed at 01/20/2019 1909 Gross per 24 hour  Intake 4186.96 ml  Output 1630 ml  Net 2556.96 ml   Filed Weights   01/15/19 0938  Weight: 95.3 kg    Exam:  Patient remained in the OR for the whole day.  Unable to examine the patient.    Data Reviewed:   I have personally reviewed following labs and imaging studies:  Labs: Labs show the following:   Basic Metabolic Panel: Recent Labs  Lab 01/16/19 0516 01/17/19 0540 01/18/19 0515 01/19/19 0512 01/20/19 0428  NA 136 137 136 136 137  K 3.4* 3.8 3.7 3.5 3.6  CL 102 104 103 104 106  CO2 24 20* 20* 22 24  GLUCOSE 105* 95 92 100* 94  BUN 13 14 11 10  6*  CREATININE 0.83 0.74 0.64 0.59* 0.65  CALCIUM 7.8* 7.9* 7.6* 7.4* 7.3*  MG 1.7 1.7 1.6* 1.7 1.6*  PHOS 2.8  --   --   --   --    GFR Estimated Creatinine Clearance: 90.8 mL/min (by C-G formula based on SCr of 0.65 mg/dL). Liver Function Tests: No results for input(s): AST, ALT, ALKPHOS, BILITOT, PROT, ALBUMIN in the last 168 hours. No  results for input(s): LIPASE, AMYLASE in the last 168 hours. No results for input(s): AMMONIA in the last 168 hours. Coagulation profile No results for input(s): INR, PROTIME in the last 168 hours.  CBC: Recent Labs  Lab 01/16/19 0516 01/17/19 0540 01/18/19 0515 01/19/19 0512 01/20/19 0428  WBC 14.0* 16.1* 15.6* 15.8* 11.3*  NEUTROABS 11.6* 13.3* 13.1* 13.2* 9.0*  HGB 12.0* 12.3* 12.1* 11.8* 11.5*  HCT 36.5* 35.5* 35.2* 35.4* 34.6*  MCV 92.9 89.2 89.6 92.2 91.5  PLT 469* 477* 489* 504* 456*   Cardiac Enzymes: No results for input(s): CKTOTAL, CKMB, CKMBINDEX, TROPONINI in the last 168 hours. BNP (last 3 results) No results for input(s): PROBNP in the last 8760 hours. CBG: No results for  input(s): GLUCAP in the last 168 hours. D-Dimer: No results for input(s): DDIMER in the last 72 hours. Hgb A1c: No results for input(s): HGBA1C in the last 72 hours. Lipid Profile: No results for input(s): CHOL, HDL, LDLCALC, TRIG, CHOLHDL, LDLDIRECT in the last 72 hours. Thyroid function studies: No results for input(s): TSH, T4TOTAL, T3FREE, THYROIDAB in the last 72 hours.  Invalid input(s): FREET3 Anemia work up: No results for input(s): VITAMINB12, FOLATE, FERRITIN, TIBC, IRON, RETICCTPCT in the last 72 hours. Sepsis Labs: Recent Labs  Lab 01/17/19 0540 01/18/19 0515 01/19/19 0512 01/20/19 0428  WBC 16.1* 15.6* 15.8* 11.3*    Microbiology Recent Results (from the past 240 hour(s))  Blood culture (routine x 2)     Status: None   Collection Time: 01/13/19 12:08 PM   Specimen: BLOOD  Result Value Ref Range Status   Specimen Description BLOOD LEFT ANTECUBITAL  Final   Special Requests   Final    BOTTLES DRAWN AEROBIC AND ANAEROBIC Blood Culture adequate volume   Culture   Final    NO GROWTH 5 DAYS Performed at [facility], [address], [place], [place] [address]    Report Status 01/18/2019 FINAL  Final  Blood culture (routine x 2)     Status: None   Collection Time: 01/13/19 12:09 PM   Specimen: BLOOD  Result Value Ref Range Status   Specimen Description BLOOD RIGHT ANTECUBITAL  Final   Special Requests   Final    BOTTLES DRAWN AEROBIC AND ANAEROBIC Blood Culture adequate volume   Culture   Final    NO GROWTH 5 DAYS Performed at [facility], [address] [place], [place] [address]    Report Status 01/18/2019 FINAL  Final  SARS CORONAVIRUS 2 (TAT 6-24 HRS) Nasopharyngeal Nasopharyngeal Swab     Status: None   Collection Time: 01/13/19  1:52 PM   Specimen: Nasopharyngeal Swab  Result Value Ref Range Status   SARS Coronavirus 2 NEGATIVE NEGATIVE Final    Comment: (NOTE) SARS-CoV-2 target nucleic acids are NOT DETECTED. The SARS-CoV-2  RNA is generally detectable in upper and lower respiratory specimens during the acute phase of infection. Negative results do not preclude SARS-CoV-2 infection, do not rule out co-infections with other pathogens, and should not be used as the sole basis for treatment or other patient management decisions. Negative results must be combined with clinical observations, patient history, and epidemiological information. The expected result is Negative. Fact Sheet for Patients: SugarRoll.be Fact Sheet for Healthcare Providers: https://www.woods-mathews.com/ This test is not yet approved or cleared by the [place] FDA and  has been authorized for detection and/or diagnosis of SARS-CoV-2 by FDA under an Emergency Use Authorization (EUA). This EUA will remain  in effect (meaning this  test can be used) for the duration of the COVID-19 declaration under Section 56 4(b)(1) of the Act, 21 U.S.C. section 360bbb-3(b)(1), unless the authorization is terminated or revoked sooner. Performed at [facility], [place] [address], [place], [place] [address]   Body fluid culture     Status: None   Collection Time: 01/15/19 11:02 AM   Specimen: Pleura; Body Fluid  Result Value Ref Range Status   Specimen Description   Final    PLEURAL Performed at [facility], [address] [place], [place] [address]    Special Requests   Final    FLUID Performed at [facility], [address] [place], [place] [address]    Gram Stain   Final    FEW WBC PRESENT, PREDOMINANTLY PMN NO ORGANISMS SEEN    Culture   Final    NO GROWTH 3 DAYS Performed at [facility], [place] [address] [place],  [address]    Report Status 01/19/2019 FINAL  Final    Procedures and diagnostic studies:  Dg Chest 2 View  Result Date: 01/19/2019 CLINICAL DATA:  Loculated left pleural effusion. Chest tube placement. COPD. EXAM: CHEST - 2 VIEW COMPARISON:   01/18/2019 FINDINGS: Left pleural pigtail catheter remains in place. A small residual loculated hydropneumothorax is seen in the posterior left hemithorax along the course of the major fissure. Small free-flowing left pleural effusion is again seen. Airspace disease again seen in the central left upper lobe, without significant change. Right lung remains clear. Heart size is normal. Aortic atherosclerosis. IMPRESSION: 1. No significant change in small left pleural effusion, and small loculated left hydropneumothorax along the major fissure. 2. Stable central left upper lobe airspace disease. Electronically Signed   By: Marlaine Hind M.D.   On: 01/19/2019 08:26   Dg Chest Port 1 View  Result Date: 01/20/2019 CLINICAL DATA:  Empyema. Left lower lobe abscess. EXAM: PORTABLE CHEST 1 VIEW 6:03 p.m. COMPARISON:  01/19/2019 FINDINGS: There are 3 new left-sided chest tubes in place. The empyema and left lower lobe abscess have been drained. No appreciable pneumothorax. Pleural thickening noted laterally. The patient has a new small right pleural effusion. Heart size is normal. Pulmonary vascularity at the upper limits of normal. Aortic atherosclerosis. No acute bone abnormality. Small amount of subcutaneous emphysema on the left. IMPRESSION: Three left chest tubes in place without pneumothorax. Lateral pleural thickening and minimal atelectasis at the left lung base. New small right pleural effusion. Electronically Signed   By: Lorriane Shire M.D.   On: 01/20/2019 18:32    Medications:   . [MAR Hold] amLODipine  10 mg Oral Daily  . [MAR Hold] baclofen  10 mg Oral QHS  . [MAR Hold] calcium carbonate  1 tablet Oral BID  . [MAR Hold] carvedilol  25 mg Oral BID WC  . [MAR Hold] docusate sodium  100 mg Oral BID  . [MAR Hold] ferrous sulfate  325 mg Oral Daily  . [MAR Hold] losartan  100 mg Oral Daily  . [MAR Hold] omega-3 acid ethyl esters  1 g Oral Daily  . [MAR Hold] pantoprazole  40 mg Oral Daily  . [MAR  Hold] tamsulosin  0.4 mg Oral Daily   Continuous Infusions: . [MAR Hold] sodium chloride Stopped (01/19/19 2154)  . [MAR Hold] cefTRIAXone (ROCEPHIN)  IV 0 mL/hr at 01/19/19 1541  . [MAR Hold] metronidazole 500 mg (01/20/19 0542)     LOS: 7 days   Berle Mull  [facility]   *Please refer to [email or web address], password [email or web address] to get updated schedule on who will round on this patient, as hospitalists switch teams weekly. If 7PM-7AM, please contact night-coverage at [email or web address], password [email or web address] for any overnight needs.  01/20/2019, 7:10 PM

## 2019-01-20 NOTE — Anesthesia Procedure Notes (Signed)
Procedure Name: Intubation Date/Time: 01/20/2019 1:03 PM Performed by: Lily Peer, Summer, RN Pre-anesthesia Checklist: Patient identified, Emergency Drugs available, Suction available, Patient being monitored and Timeout performed Patient Re-evaluated:Patient Re-evaluated prior to induction Oxygen Delivery Method: Circle system utilized Preoxygenation: Pre-oxygenation with 100% oxygen Induction Type: IV induction Ventilation: Mask ventilation without difficulty Laryngoscope Size: McGraph and 4 Grade View: Grade I Endobronchial tube: Double lumen EBT and 39 Fr Number of attempts: 1 Airway Equipment and Method: Stylet Placement Confirmation: ETT inserted through vocal cords under direct vision,  positive ETCO2 and breath sounds checked- equal and bilateral Tube secured with: Tape Dental Injury: Teeth and Oropharynx as per pre-operative assessment

## 2019-01-20 NOTE — Anesthesia Postprocedure Evaluation (Signed)
Anesthesia Post Note  Patient: Jonathan Burns  Procedure(s) Performed: FLEXIBLE BRONCHOSCOPY (Left ) VIDEO ASSISTED THORACOSCOPY (VATS)/THOROCOTOMY (Left ) CONVERTED TO THORACOTOMY MAJOR (Left )  Patient location during evaluation: PACU Anesthesia Type: General Level of consciousness: awake and alert Pain management: pain level controlled Vital Signs Assessment: post-procedure vital signs reviewed and stable Respiratory status: spontaneous breathing, nonlabored ventilation, respiratory function stable and patient connected to nasal cannula oxygen Cardiovascular status: blood pressure returned to baseline and stable Postop Assessment: no apparent nausea or vomiting Anesthetic complications: no     Last Vitals:  Vitals:   01/20/19 1925 01/20/19 2000  BP: (!) 93/55 (!) 101/58  Pulse: 79 81  Resp: (!) 32 (!) 26  Temp:  36.8 C  SpO2: 96% 96%    Last Pain:  Vitals:   01/20/19 2000  TempSrc: Axillary  PainSc: 0-No pain                 Precious Haws Piscitello

## 2019-01-20 NOTE — Anesthesia Preprocedure Evaluation (Signed)
Anesthesia Evaluation  Patient identified by MRN, date of birth, ID band Patient awake    Reviewed: Allergy & Precautions, NPO status , Patient's Chart, lab work & pertinent test results  History of Anesthesia Complications Negative for: history of anesthetic complications  Airway Mallampati: II  TM Distance: >3 FB Neck ROM: Full    Dental no notable dental hx.    Pulmonary sleep apnea , COPD, former smoker,    breath sounds clear to auscultation- rhonchi (-) wheezing      Cardiovascular hypertension, Pt. on medications (-) CAD, (-) Past MI, (-) Cardiac Stents and (-) CABG  Rhythm:Regular Rate:Normal - Systolic murmurs and - Diastolic murmurs    Neuro/Psych neg Seizures Anxiety negative neurological ROS     GI/Hepatic PUD, GERD  ,  Endo/Other  negative endocrine ROSneg diabetes  Renal/GU negative Renal ROS     Musculoskeletal negative musculoskeletal ROS (+)   Abdominal (+) + obese,   Peds  Hematology  (+) anemia ,   Anesthesia Other Findings Past Medical History: No date: Anemia No date: BPH (benign prostatic hypertrophy) No date: Cancer (HCC) No date: Chronic GI bleeding No date: COPD (chronic obstructive pulmonary disease) (HCC) No date: Hyperlipidemia No date: Hypertension No date: Prostate nodule No date: PUD (peptic ulcer disease) No date: Rising PSA level   Reproductive/Obstetrics                             Anesthesia Physical Anesthesia Plan  ASA: III  Anesthesia Plan: General   Post-op Pain Management:    Induction: Intravenous  PONV Risk Score and Plan: 1 and Ondansetron and Dexamethasone  Airway Management Planned: Double Lumen EBT  Additional Equipment:   Intra-op Plan:   Post-operative Plan: Extubation in OR and Possible Post-op intubation/ventilation  Informed Consent: I have reviewed the patients History and Physical, chart, labs and discussed the  procedure including the risks, benefits and alternatives for the proposed anesthesia with the patient or authorized representative who has indicated his/her understanding and acceptance.     Dental advisory given  Plan Discussed with: CRNA and Anesthesiologist  Anesthesia Plan Comments:         Anesthesia Quick Evaluation

## 2019-01-21 ENCOUNTER — Encounter: Payer: Self-pay | Admitting: Cardiothoracic Surgery

## 2019-01-21 ENCOUNTER — Telehealth: Payer: Self-pay | Admitting: Family Medicine

## 2019-01-21 ENCOUNTER — Inpatient Hospital Stay: Payer: Medicare Other

## 2019-01-21 DIAGNOSIS — D638 Anemia in other chronic diseases classified elsewhere: Secondary | ICD-10-CM | POA: Diagnosis not present

## 2019-01-21 DIAGNOSIS — C61 Malignant neoplasm of prostate: Secondary | ICD-10-CM | POA: Diagnosis not present

## 2019-01-21 DIAGNOSIS — K279 Peptic ulcer, site unspecified, unspecified as acute or chronic, without hemorrhage or perforation: Secondary | ICD-10-CM

## 2019-01-21 DIAGNOSIS — Z923 Personal history of irradiation: Secondary | ICD-10-CM

## 2019-01-21 DIAGNOSIS — Z960 Presence of urogenital implants: Secondary | ICD-10-CM

## 2019-01-21 DIAGNOSIS — Z87891 Personal history of nicotine dependence: Secondary | ICD-10-CM

## 2019-01-21 DIAGNOSIS — J869 Pyothorax without fistula: Principal | ICD-10-CM

## 2019-01-21 DIAGNOSIS — I10 Essential (primary) hypertension: Secondary | ICD-10-CM

## 2019-01-21 LAB — BASIC METABOLIC PANEL
Anion gap: 7 (ref 5–15)
BUN: 11 mg/dL (ref 8–23)
CO2: 23 mmol/L (ref 22–32)
Calcium: 7.6 mg/dL — ABNORMAL LOW (ref 8.9–10.3)
Chloride: 102 mmol/L (ref 98–111)
Creatinine, Ser: 0.83 mg/dL (ref 0.61–1.24)
GFR calc Af Amer: >60 mL/min (ref >60)
GFR calc non Af Amer: >60 mL/min (ref >60)
Glucose, Bld: 176 mg/dL — ABNORMAL HIGH (ref 70–99)
Potassium: 4.2 mmol/L (ref 3.5–5.1)
Sodium: 132 mmol/L — ABNORMAL LOW (ref 135–145)

## 2019-01-21 LAB — CBC WITH DIFFERENTIAL/PLATELET
Abs Immature Granulocytes: 0.2 10*3/uL — ABNORMAL HIGH (ref 0.00–0.07)
Basophils Absolute: 0 10*3/uL (ref 0.0–0.1)
Basophils Relative: 0 %
Eosinophils Absolute: 0 10*3/uL (ref 0.0–0.5)
Eosinophils Relative: 0 %
HCT: 31.7 % — ABNORMAL LOW (ref 39.0–52.0)
Hemoglobin: 10.7 g/dL — ABNORMAL LOW (ref 13.0–17.0)
Immature Granulocytes: 1 %
Lymphocytes Relative: 4 %
Lymphs Abs: 0.6 10*3/uL — ABNORMAL LOW (ref 0.7–4.0)
MCH: 30.4 pg (ref 26.0–34.0)
MCHC: 33.8 g/dL (ref 30.0–36.0)
MCV: 90.1 fL (ref 80.0–100.0)
Monocytes Absolute: 1.1 10*3/uL — ABNORMAL HIGH (ref 0.1–1.0)
Monocytes Relative: 8 %
Neutro Abs: 12.4 10*3/uL — ABNORMAL HIGH (ref 1.7–7.7)
Neutrophils Relative %: 87 %
Platelets: 445 10*3/uL — ABNORMAL HIGH (ref 150–400)
RBC: 3.52 MIL/uL — ABNORMAL LOW (ref 4.22–5.81)
RDW: 14.3 % (ref 11.5–15.5)
WBC: 14.4 10*3/uL — ABNORMAL HIGH (ref 4.0–10.5)
nRBC: 0 % (ref 0.0–0.2)

## 2019-01-21 LAB — BPAM RBC
Blood Product Expiration Date: 202012032359
Blood Product Expiration Date: 202012252359
Blood Product Expiration Date: 202012272359
ISSUE DATE / TIME: 202012020737
Unit Type and Rh: 5100
Unit Type and Rh: 6200
Unit Type and Rh: 6200

## 2019-01-21 LAB — TYPE AND SCREEN
ABO/RH(D): A POS
Antibody Screen: NEGATIVE
Unit division: 0
Unit division: 0
Unit division: 0

## 2019-01-21 LAB — PREPARE RBC (CROSSMATCH)

## 2019-01-21 LAB — MAGNESIUM: Magnesium: 1.8 mg/dL (ref 1.7–2.4)

## 2019-01-21 MED ORDER — DEXTROSE-NACL 5-0.9 % IV SOLN
INTRAVENOUS | Status: DC
  Administered 2019-01-21: 21:00:00 via INTRAVENOUS

## 2019-01-21 MED ORDER — SODIUM CHLORIDE 0.9 % IV BOLUS
250.0000 mL | Freq: Once | INTRAVENOUS | Status: AC
  Administered 2019-01-21: 250 mL via INTRAVENOUS

## 2019-01-21 MED ORDER — SODIUM CHLORIDE 0.9 % IV SOLN
2.0000 g | INTRAVENOUS | Status: DC
  Administered 2019-01-21: 2 g via INTRAVENOUS
  Filled 2019-01-21: qty 2
  Filled 2019-01-21 (×2): qty 20

## 2019-01-21 NOTE — Progress Notes (Signed)
[facility] PROGRESS NOTE  Patient: Jonathan Burns DSW:[id number]   PCP: Valerie Roys, DO DOB: 1945-07-08   DOA: 01/13/2019   DOS: 01/21/2019    Assessment and plan: Care Plan per cardiothoracic surgery and PCCM. Care transferred tp these services. Highly appreciate their assistance.  Infectious disease consulted for Antibiotics management. Appreciate their assistance as well.  We will sign off for now.  [facility] will be happy to take over the care one out of the ICU.  Author: Berle Mull, MD [facility] 01/21/2019 9:02 AM   If 7PM-7AM, please contact night-coverage at [email or web address]

## 2019-01-21 NOTE — Consult Note (Signed)
NAME: Jonathan Burns  DOB: Jul 15, 1945  MRN: [id number]  Date/Time: 01/21/2019 6:47 PM  REQUESTING PROVIDER:patel Subjective:  REASON FOR CONSULT: empyema ? Jonathan Burns is a 73 y.o. male with a history of prostate adenocarcinoma, S/p IMRT, androgen deprivation therapy, HTN was sent to the ED by his PCP for left empyema on 01/13/19  Pt Went to his PCP on 01/06/19 with fatigue and weakness of 2 weeks- vitals normal in her office. Labs revealed a wbc of 21 and she sent covid ( which was negative) and started him on augmentin. She repeated WBC on 01/12/19 and as it was still high at 22 , she ordered CT abdomen and pelvis because of his prostate cancer history  And it showed Partially visualized loculated appearing moderate-sized left pleural effusion with findings concerning for empyema. He was sent to the ED and was admitted and started on ceftriaxone and flagyl.Marland Kitchen He underwent CT chest the next day 01/14/19 and it showed Moderate left pleural effusion, partially loculated.Mild pleural thickening, favoring a chronic appearance.Dependent atelectasis in the lingula and compressive atelectasis in the left lower lobe. IR placed CT guided chest tube on 01/15/19 but it did not drain well due to loculations.culture was negative. TPA was instilled thru the tube on 01/15/19  On 01/20/19 he underwent  Left thoracoscopy with conversion to left thoracotomy; decortication of visceral and parietal pleura; intrapleural drainage of left lower lobe abscess- cultures pending I am asked to see the patient for antibiotic managment    Past Medical History:  Diagnosis Date  . Anemia   . BPH (benign prostatic hypertrophy)   . Cancer ([place])   . Chronic GI bleeding   . COPD (chronic obstructive pulmonary disease) ([place])   . Hyperlipidemia   . Hypertension   . Prostate nodule   . PUD (peptic ulcer disease)   . Rising PSA level     Past Surgical History:  Procedure Laterality Date  . COLONOSCOPY  2015  .  ESOPHAGOGASTRODUODENOSCOPY  2015  . FLEXIBLE BRONCHOSCOPY Left 01/20/2019   Procedure: FLEXIBLE BRONCHOSCOPY;  Surgeon: Nestor Lewandowsky, MD;  Location: [facility] ORS;  Service: Thoracic;  Laterality: Left;  . THORACOTOMY Left 01/20/2019   Procedure: CONVERTED TO THORACOTOMY MAJOR;  Surgeon: Nestor Lewandowsky, MD;  Location: [facility] ORS;  Service: Thoracic;  Laterality: Left;  Marland Kitchen VIDEO ASSISTED THORACOSCOPY (VATS)/THOROCOTOMY Left 01/20/2019   Procedure: VIDEO ASSISTED THORACOSCOPY (VATS)/THOROCOTOMY;  Surgeon: Nestor Lewandowsky, MD;  Location: [facility] ORS;  Service: Thoracic;  Laterality: Left;    Social History   Socioeconomic History  . Marital status: Married    Spouse name: Not on file  . Number of children: Not on file  . Years of education: Not on file  . Highest education level: Associate degree: academic program  Occupational History  . Not on file  Social Needs  . Financial resource strain: Not hard at all  . Food insecurity    Worry: Never true    Inability: Never true  . Transportation needs    Medical: No    Non-medical: No  Tobacco Use  . Smoking status: Former Smoker    Packs/day: 1.00    Years: 30.00    Pack years: 30.00    Types: Cigarettes    Quit date: 08/17/1986    Years since quitting: 32.4  . Smokeless tobacco: Never Used  Substance and Sexual Activity  . Alcohol use: Yes    Alcohol/week: 0.0 standard drinks    Comment: occasional  . Drug use: No  .  Sexual activity: Not on file  Lifestyle  . Physical activity    Days per week: 0 days    Minutes per session: 0 min  . Stress: Not at all  Relationships  . Social connections    Talks on phone: More than three times a week    Gets together: More than three times a week    Attends religious service: More than 4 times per year    Active member of club or organization: No    Attends meetings of clubs or organizations: Never    Relationship status: Married  . Intimate partner violence    Fear of current or ex partner: No     Emotionally abused: No    Physically abused: No    Forced sexual activity: No  Other Topics Concern  . Not on file  Social History Narrative   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    Involved in [place] activities     Family History  Problem Relation Age of Onset  . Cancer Mother        breast  . Diabetes Mother   . Aneurysm Father   . Kidney disease Neg Hx   . Prostate cancer Neg Hx   . Kidney cancer Neg Hx    No Known Allergies  ? Current Facility-Administered Medications  Medication Dose Route Frequency Provider Last Rate Last Dose  . 0.9 %  sodium chloride infusion   Intravenous PRN Nestor Lewandowsky, MD   Stopped at 01/19/19 2154  . acetaminophen (TYLENOL) tablet 650 mg  650 mg Oral Q6H PRN Nestor Lewandowsky, MD       Or  . acetaminophen (TYLENOL) suppository 650 mg  650 mg Rectal Q6H PRN Nestor Lewandowsky, MD      . albuterol (PROVENTIL) (2.5 MG/3ML) 0.083% nebulizer solution 2.5 mg  2.5 mg Nebulization Q2H PRN Nestor Lewandowsky, MD      . alum & mag hydroxide-simeth (MAALOX/MYLANTA) 200-200-20 MG/5ML suspension 30 mL  30 mL Oral Q4H PRN Nestor Lewandowsky, MD   30 mL at 01/19/19 1706  . baclofen (LIORESAL) tablet 10 mg  10 mg Oral QHS Nestor Lewandowsky, MD   10 mg at 01/20/19 2258  . bisacodyl (DULCOLAX) EC tablet 10 mg  10 mg Oral Daily Nestor Lewandowsky, MD      . bisacodyl (DULCOLAX) EC tablet 5 mg  5 mg Oral Daily PRN Nestor Lewandowsky, MD      . calcium carbonate (TUMS - dosed in mg elemental calcium) chewable tablet 200 mg of elemental calcium  1 tablet Oral BID Nestor Lewandowsky, MD   200 mg of elemental calcium at 01/21/19 1021  . cefTRIAXone (ROCEPHIN) 2 g in sodium chloride 0.9 % 100 mL IVPB  2 g Intravenous Q24H Ottie Glazier, MD   Stopped at 01/21/19 1821  . Chlorhexidine Gluconate Cloth 2 % PADS 6 each  6 each Topical Q0600 Lavina Hamman, MD      . dextrose 5 %-0.45 % sodium chloride infusion   Intravenous Continuous Nestor Lewandowsky, MD 75 mL/hr at 01/21/19 1845    . docusate sodium (COLACE)  capsule 100 mg  100 mg Oral BID Nestor Lewandowsky, MD   100 mg at 01/20/19 2141  . ferrous sulfate tablet 325 mg  325 mg Oral Daily Nestor Lewandowsky, MD   325 mg at 01/21/19 1021  . metroNIDAZOLE (FLAGYL) IVPB 500 mg  500 mg Intravenous Arvilla Meres, MD   Stopped at 01/21/19 1525  . morphine 2 MG/ML injection 1-2 mg  1-2 mg Intravenous Q1H PRN Nestor Lewandowsky, MD      . ondansetron [facility]) tablet 4 mg  4 mg Oral Q6H PRN Nestor Lewandowsky, MD       Or  . ondansetron [facility]) injection 4 mg  4 mg Intravenous Q6H PRN Nestor Lewandowsky, MD      . oxyCODONE (Oxy IR/ROXICODONE) immediate release tablet 5-10 mg  5-10 mg Oral Q4H PRN Nestor Lewandowsky, MD      . pantoprazole (PROTONIX) EC tablet 40 mg  40 mg Oral Daily Nestor Lewandowsky, MD   40 mg at 01/21/19 1021  . polyethylene glycol (MIRALAX / GLYCOLAX) packet 17 g  17 g Oral Daily PRN Nestor Lewandowsky, MD      . traMADol Veat[facility] Bourbon) tablet 100 mg  100 mg Oral Q6H Nestor Lewandowsky, MD   100 mg at 01/21/19 1748     Abtx:  Anti-infectives (From admission, onward)   Start     Dose/Rate Route Frequency Ordered Stop   01/21/19 1800  cefTRIAXone (ROCEPHIN) 2 g in sodium chloride 0.9 % 100 mL IVPB     2 g 200 mL/hr over 30 Minutes Intravenous Every 24 hours 01/21/19 1516     01/14/19 1400  cefTRIAXone (ROCEPHIN) 1 g in sodium chloride 0.9 % 100 mL IVPB  Status:  Discontinued     1 g 200 mL/hr over 30 Minutes Intravenous Every 24 hours 01/13/19 1538 01/21/19 1516   01/13/19 2200  metroNIDAZOLE (FLAGYL) IVPB 500 mg     500 mg 100 mL/hr over 60 Minutes Intravenous Every 8 hours 01/13/19 1538     01/13/19 1400  metroNIDAZOLE (FLAGYL) IVPB 500 mg     500 mg 100 mL/hr over 60 Minutes Intravenous  Once 01/13/19 1351 01/13/19 1531   01/13/19 1400  cefTRIAXone (ROCEPHIN) 1 g in sodium chloride 0.9 % 100 mL IVPB     1 g 200 mL/hr over 30 Minutes Intravenous  Once 01/13/19 1351 01/13/19 1457      REVIEW OF SYSTEMS:  Const: negative fever, negative chills, negative weight loss  Eyes: negative diplopia or visual changes, negative eye pain ENT: negative coryza, negative sore throat Resp: negative cough, hemoptysis, dyspnea Cards: negative for chest pain, palpitations, lower extremity edema GU: negative for frequency, dysuria and hematuria GI: Negative for abdominal pain, diarrhea, bleeding, constipation Skin: negative for rash and pruritus Heme: negative for easy bruising and gum/nose bleeding FV:CBSW back pain and muscle weakness Neurolo:negative for headaches, dizziness, vertigo, memory problems  Psych: negative for feelings of anxiety, depression  Endocrine: negative for thyroid, diabetes issue Allergy/Immunology- negative for any medication or food allergies ?  Objective:  VITALS:  BP (!) 108/57   Pulse 92   Temp 99.6 F (37.6 C) (Axillary)   Resp (!) 25   Ht _0  (1.727 m)   Wt 95.3 kg   SpO2 95%   BMI 31.93 kg/m  PHYSICAL EXAM:  General: Alert, cooperative, no distress, appears stated age.  Head: Normocephalic, without obvious abnormality, atraumatic. Eyes: Conjunctivae clear, anicteric sclerae. Pupils are equal ENT Nares normal. No drainage or sinus tenderness. Lips, mucosa, and tongue normal. No Thrush Dentition poor Neck: Supple, symmetrical, no adenopathy, thyroid: non tender no carotid bruit and no JVD. Back: No CVA tenderness. Lungs: b/l air entry- decreased left - 4 chest drains left  Heart: Regular rate and rhythm, no murmur, rub or gallop. Abdomen: Soft, non-tender,not distended. Bowel sounds normal. No masses Foley catheter Extremities: atraumatic, no cyanosis. No edema. No clubbing Skin: No  rashes or lesions. Or bruising Lymph: Cervical, supraclavicular normal. Neurologic: Grossly non-focal Pertinent Labs Lab Results CBC      Component Value Date/Time   WBC 14.4 (H) 01/21/2019 0511   RBC 3.52 (L) 01/21/2019 0511   HGB 10.7 (L) 01/21/2019 0511   HGB 14.0 01/12/2019 0823   HCT 31.7 (L) 01/21/2019 0511   HCT 42.4  01/12/2019 0823   PLT 445 (H) 01/21/2019 0511   PLT 610 (H) 01/12/2019 0823   MCV 90.1 01/21/2019 0511   MCV 95 01/12/2019 0823   MCH 30.4 01/21/2019 0511   MCHC 33.8 01/21/2019 0511   RDW 14.3 01/21/2019 0511   RDW 12.8 01/12/2019 0823   LYMPHSABS 0.6 (L) 01/21/2019 0511   LYMPHSABS 1.3 01/12/2019 0823   MONOABS 1.1 (H) 01/21/2019 0511   EOSABS 0.0 01/21/2019 0511   EOSABS 0.1 01/12/2019 0823   BASOSABS 0.0 01/21/2019 0511   BASOSABS 0.1 01/12/2019 0823    CMP Latest Ref Rng & Units 01/21/2019 01/20/2019 01/20/2019  Glucose 70 - 99 mg/dL 176(H) 145(H) 94  BUN 8 - 23 mg/dL 11 10 6(L)  Creatinine 0.61 - 1.24 mg/dL 0.83 0.68 0.65  Sodium 135 - 145 mmol/L 132(L) 134(L) 137  Potassium 3.5 - 5.1 mmol/L 4.2 4.6 3.6  Chloride 98 - 111 mmol/L 102 103 106  CO2 22 - 32 mmol/L 23 21(L) 24  Calcium 8.9 - 10.3 mg/dL 7.6(L) 7.6(L) 7.3(L)  Total Protein 6.5 - 8.1 g/dL - - -  Total Bilirubin 0.3 - 1.2 mg/dL - - -  Alkaline Phos 38 - 126 U/L - - -  AST 15 - 41 U/L - - -  ALT 0 - 44 U/L - - -      Microbiology: Recent Results (from the past 240 hour(s))  Blood culture (routine x 2)     Status: None   Collection Time: 01/13/19 12:08 PM   Specimen: BLOOD  Result Value Ref Range Status   Specimen Description BLOOD LEFT ANTECUBITAL  Final   Special Requests   Final    BOTTLES DRAWN AEROBIC AND ANAEROBIC Blood Culture adequate volume   Culture   Final    NO GROWTH 5 DAYS Performed at [facility], [address] [place], [place] [address]    Report Status 01/18/2019 FINAL  Final  Blood culture (routine x 2)     Status: None   Collection Time: 01/13/19 12:09 PM   Specimen: BLOOD  Result Value Ref Range Status   Specimen Description BLOOD RIGHT ANTECUBITAL  Final   Special Requests   Final    BOTTLES DRAWN AEROBIC AND ANAEROBIC Blood Culture adequate volume   Culture   Final    NO GROWTH 5 DAYS Performed at [facility], [address] [place], [place] [address]     Report Status 01/18/2019 FINAL  Final  SARS CORONAVIRUS 2 (TAT 6-24 HRS) Nasopharyngeal Nasopharyngeal Swab     Status: None   Collection Time: 01/13/19  1:52 PM   Specimen: Nasopharyngeal Swab  Result Value Ref Range Status   SARS Coronavirus 2 NEGATIVE NEGATIVE Final    Comment: (NOTE) SARS-CoV-2 target nucleic acids are NOT DETECTED. The SARS-CoV-2 RNA is generally detectable in upper and lower respiratory specimens during the acute phase of infection. Negative results do not preclude SARS-CoV-2 infection, do not rule out co-infections with other pathogens, and should not be used as the sole basis for treatment or other patient management decisions. Negative results must be combined with clinical observations,  patient history, and epidemiological information. The expected result is Negative. Fact Sheet for Patients: SugarRoll.be Fact Sheet for Healthcare Providers: https://www.woods-mathews.com/ This test is not yet approved or cleared by the [place] FDA and  has been authorized for detection and/or diagnosis of SARS-CoV-2 by FDA under an Emergency Use Authorization (EUA). This EUA will remain  in effect (meaning this test can be used) for the duration of the COVID-19 declaration under Section 56 4(b)(1) of the Act, 21 U.S.C. section 360bbb-3(b)(1), unless the authorization is terminated or revoked sooner. Performed at [facility], [place] [address], [place], [place] [address]   Body fluid culture     Status: None   Collection Time: 01/15/19 11:02 AM   Specimen: Pleura; Body Fluid  Result Value Ref Range Status   Specimen Description   Final    PLEURAL Performed at [facility], [address] [place], [place] [address]    Special Requests   Final    FLUID Performed at [facility], [address] [place], [place] [address]    Gram Stain   Final    FEW WBC PRESENT, PREDOMINANTLY PMN NO ORGANISMS  SEEN    Culture   Final    NO GROWTH 3 DAYS Performed at [facility], [place] [address], [place], [place] [address]    Report Status 01/19/2019 FINAL  Final  Aerobic/Anaerobic Culture (surgical/deep wound)     Status: None (Preliminary result)   Collection Time: 01/20/19  2:29 PM   Specimen: PATH Cytology Pleural fluid; Body Fluid  Result Value Ref Range Status   Specimen Description   Final    PLEURAL LEFT Performed at [facility], [address] [place], [place] [address]    Special Requests   Final    NONE Performed at [facility], [address] [place], [place] [address]    Gram Stain   Final    RARE WBC PRESENT, PREDOMINANTLY PMN NO ORGANISMS SEEN Performed at [facility], [place] [address], [place], [place] [address]    Culture PENDING  Incomplete   Report Status PENDING  Incomplete  Aerobic/Anaerobic Culture (surgical/deep wound)     Status: None (Preliminary result)   Collection Time: 01/20/19  2:29 PM   Specimen: PATH Cytology Pleural fluid; Body Fluid  Result Value Ref Range Status   Specimen Description   Final    TISSUE LUNG LEFT Performed at [facility], [address] [place], [place] [address]    Special Requests   Final    NONE Performed at [facility], [place], [place] [address]    Gram Stain   Final    RARE WBC PRESENT,BOTH PMN AND MONONUCLEAR MODERATE GRAM POSITIVE COCCI IN PAIRS Performed at [facility], [place] [address], [place],  [address]    Culture PENDING  Incomplete   Report Status PENDING  Incomplete  MRSA PCR Screening     Status: None   Collection Time: 01/20/19  8:08 PM   Specimen: Nasal Mucosa; Nasopharyngeal  Result Value Ref Range Status   MRSA by PCR NEGATIVE NEGATIVE Final    Comment:        The GeneXpert MRSA Assay (FDA approved for NASAL specimens only), is one component of a comprehensive MRSA colonization surveillance program. It is not intended to  diagnose MRSA infection nor to guide or monitor treatment for MRSA infections. Performed at [facility], [address] [place], [place] [address]     IMAGING RESULTS:  I have personally reviewed the films ? Impression/Recommendation ? ?Left empyema:very likely  Micro Aspiration:  Initially had chest drain but because of loculation did not work very well and he had TPA instilled with no improvement.  Hence he underwent thoracotomy, decortication of the visceral and parietal pleura, intrapleural drainage of the left lower lobe abscess with chest tube placement on 01/20/2019.  Initial fluid Cultures have  been negative but this could have been because he was on Augmentin as outpatient for nearly 6 days.   The gram stain from yesterday has gram posiitve cocci in pairs He is currently on ceftriaxone and Flagyl.  Once the culture finalizes we may be able to change to unasyn.  Will need long-term antibiotics at least 4 weeks it may not be entirely intravenous.  Can give IV for 2 weeks and then switch to p.o. ?  Anemia of chronic illness  Hypertension: No medications currently as he had hypotension.    Prostate CA: Gleason 8 adenocarcinoma of the prostate stage IIb.  Status post radiation to prostate and pelvic nodes in 2019.Marland Kitchen  And is currently on androgen deprivation therapy for monthly Lupron/Eligard at the urologist office.  ___________________________________________________ Discussed with patient and  requesting provider Note:  This document was prepared using Dragon voice recognition software and may include unintentional dictation errors.

## 2019-01-21 NOTE — Telephone Encounter (Signed)
Copied from [place] [phone]. Topic: General - Other >> Jan 21, 2019  4:32 PM Wynetta Emery, [place] C wrote: Reason for CRM: pt's daughter called in to make provider aware that pt is in the hospital and has had surgery. Pt will not be at his apt that is scheduled for today. (imaging)

## 2019-01-21 NOTE — Consult Note (Signed)
PHARMACY CONSULT NOTE - FOLLOW UP  Pharmacy Consult for Electrolyte Monitoring and Replacement   Recent Labs: Potassium (mmol/Burns)  Date Value  01/21/2019 4.2   Magnesium (mg/dL)  Date Value  01/21/2019 1.8   Calcium (mg/dL)  Date Value  01/21/2019 7.6 (Burns)   Albumin (g/dL)  Date Value  01/13/2019 2.5 (Burns)  01/06/2019 2.9 (Burns)   Phosphorus (mg/dL)  Date Value  01/16/2019 2.8   Sodium (mmol/Burns)  Date Value  01/21/2019 132 (Burns)  01/12/2019 138   Corrected Ca: 8.5 mg/dL  Assessment: 73 y.o. male with left pleural effusion. Pharmacy has been consulted for potassium management.  Goal of Therapy:  Electrolytes WNL   Plan:   MIVF transitioned to D5NS at 48mL/h  f/u electrolytes with am labs  Jonathan Burns,Jonathan Burns ,PharmD Clinical Pharmacist 01/21/2019 8:11 PM

## 2019-01-21 NOTE — Progress Notes (Signed)
Awake and alert.  No complaints of pain or shortness of breath.  BP about 90 - 100 over 60  Sats mid 90s on nasal oxygen  Wounds are clean and dry  Small air leak.  Sero sanguinous drainage from tubes  CXRay today looks good for postop  Will give 250 cc of saline Leave chest tubes to suction Needs physical therapy consultation In my opinion, would recommend a full 6 week course of IV antibiotics but would ask for ID input  [employer].

## 2019-01-21 NOTE — Progress Notes (Signed)
CRITICAL CARE PROGRESS NOTE    Name: Jonathan Burns MRN: [id number] DOB: [birth year]     LOS: 36   SUBJECTIVE FINDINGS & SIGNIFICANT EVENTS   Patient description:  73 yo male admitted to the medsurg unit with left pleural effusion/empyema and LLL abscess despite multiple tPA instillations pleural effusion unchanged left thoracotomy; decortication of visceral and parietal pleural; and intrapleural drainage of left lower lobe abscess with chest tube placement transferred to ICU postop for closer monitoring    Lines / Drains: PIVx2  Cultures / Sepsis markers: Pleural empyema culture - pending Surgical/deep wound culture - pending  Antibiotics: Rocephin and Flagyl        PAST MEDICAL HISTORY   Past Medical History:  Diagnosis Date  . Anemia   . BPH (benign prostatic hypertrophy)   . Cancer ([place])   . Chronic GI bleeding   . COPD (chronic obstructive pulmonary disease) ([place])   . Hyperlipidemia   . Hypertension   . Prostate nodule   . PUD (peptic ulcer disease)   . Rising PSA level      SURGICAL HISTORY   Past Surgical History:  Procedure Laterality Date  . COLONOSCOPY  2015  . ESOPHAGOGASTRODUODENOSCOPY  2015  . FLEXIBLE BRONCHOSCOPY Left 01/20/2019   Procedure: FLEXIBLE BRONCHOSCOPY;  Surgeon: Nestor Lewandowsky, MD;  Location: [facility] ORS;  Service: Thoracic;  Laterality: Left;  . THORACOTOMY Left 01/20/2019   Procedure: CONVERTED TO THORACOTOMY MAJOR;  Surgeon: Nestor Lewandowsky, MD;  Location: [facility] ORS;  Service: Thoracic;  Laterality: Left;  Marland Kitchen VIDEO ASSISTED THORACOSCOPY (VATS)/THOROCOTOMY Left 01/20/2019   Procedure: VIDEO ASSISTED THORACOSCOPY (VATS)/THOROCOTOMY;  Surgeon: Nestor Lewandowsky, MD;  Location: [facility] ORS;  Service: Thoracic;  Laterality: Left;     FAMILY HISTORY   Family History  Problem  Relation Age of Onset  . Cancer Mother        breast  . Diabetes Mother   . Aneurysm Father   . Kidney disease Neg Hx   . Prostate cancer Neg Hx   . Kidney cancer Neg Hx      SOCIAL HISTORY   Social History   Tobacco Use  . Smoking status: Former Smoker    Packs/day: 1.00    Years: 30.00    Pack years: 30.00    Types: Cigarettes    Quit date: 08/17/1986    Years since quitting: 32.4  . Smokeless tobacco: Never Used  Substance Use Topics  . Alcohol use: Yes    Alcohol/week: 0.0 standard drinks    Comment: occasional  . Drug use: No     MEDICATIONS   Current Medication:  Current Facility-Administered Medications:  .  0.9 %  sodium chloride infusion, , Intravenous, PRN, Nestor Lewandowsky, MD, Stopped at 01/19/19 2154 .  acetaminophen (TYLENOL) tablet 650 mg, 650 mg, Oral, Q6H PRN **OR** acetaminophen (TYLENOL) suppository 650 mg, 650 mg, Rectal, Q6H PRN, Nestor Lewandowsky, MD .  albuterol (PROVENTIL) (2.5 MG/3ML) 0.083% nebulizer solution 2.5 mg, 2.5 mg, Nebulization, Q2H PRN, Nestor Lewandowsky, MD .  alum & mag hydroxide-simeth (MAALOX/MYLANTA) 200-200-20 MG/5ML suspension 30 mL, 30 mL, Oral, Q4H PRN, Nestor Lewandowsky, MD, 30 mL at 01/19/19 1706 .  baclofen (LIORESAL) tablet 10 mg, 10 mg, Oral, QHS, Oaks, Christia Reading, MD, 10 mg at 01/20/19 2258 .  bisacodyl (DULCOLAX) EC tablet 10 mg, 10 mg, Oral, Daily, Oaks, [place], MD .  bisacodyl (DULCOLAX) EC tablet 5 mg, 5 mg, Oral, Daily PRN, Nestor Lewandowsky, MD .  calcium carbonate (TUMS - dosed in mg  elemental calcium) chewable tablet 200 mg of elemental calcium, 1 tablet, Oral, BID, Oaks, Christia Reading, MD, 200 mg of elemental calcium at 01/20/19 2140 .  cefTRIAXone (ROCEPHIN) 1 g in sodium chloride 0.9 % 100 mL IVPB, 1 g, Intravenous, Q24H, Oaks, Christia Reading, MD, Last Rate: 0 mL/hr at 01/19/19 1541, 1 g at 01/20/19 1306 .  Chlorhexidine Gluconate Cloth 2 % PADS 6 each, 6 each, Topical, Q0600, Lavina Hamman, MD .  dextrose 5 %-0.45 % sodium chloride  infusion, , Intravenous, Continuous, Nestor Lewandowsky, MD, Last Rate: 75 mL/hr at 01/20/19 1914 .  docusate sodium (COLACE) capsule 100 mg, 100 mg, Oral, BID, Nestor Lewandowsky, MD, 100 mg at 01/20/19 2141 .  ferrous sulfate tablet 325 mg, 325 mg, Oral, Daily, Nestor Lewandowsky, MD, 325 mg at 01/20/19 0842 .  metroNIDAZOLE (FLAGYL) IVPB 500 mg, 500 mg, Intravenous, Q8H, Nestor Lewandowsky, MD, Stopped at 01/21/19 [phone] .  morphine 2 MG/ML injection 1-2 mg, 1-2 mg, Intravenous, Q1H PRN, Nestor Lewandowsky, MD .  ondansetron (ZOFRAN) tablet 4 mg, 4 mg, Oral, Q6H PRN **OR** ondansetron (ZOFRAN) injection 4 mg, 4 mg, Intravenous, Q6H PRN, Nestor Lewandowsky, MD .  oxyCODONE (Oxy IR/ROXICODONE) immediate release tablet 5-10 mg, 5-10 mg, Oral, Q4H PRN, Nestor Lewandowsky, MD .  pantoprazole (PROTONIX) EC tablet 40 mg, 40 mg, Oral, Daily, Nestor Lewandowsky, MD, 40 mg at 01/20/19 0841 .  polyethylene glycol (MIRALAX / GLYCOLAX) packet 17 g, 17 g, Oral, Daily PRN, Nestor Lewandowsky, MD .  traMADol Veatrice Bourbon) tablet 100 mg, 100 mg, Oral, Q6H, Oaks, Christia Reading, MD, 100 mg at 01/21/19 7371    ALLERGIES   Patient has no known allergies.    REVIEW OF SYSTEMS     10 point ROS done and is positive for mild pain around surgical wound  PHYSICAL EXAMINATION   Vital Signs: Temp:  [96.8 F (36 C)-98.3 F (36.8 C)] 98.2 F (36.8 C) (12/03 0831) Pulse Rate:  [76-92] 84 (12/03 0900) Resp:  [16-35] 22 (12/03 0900) BP: (84-150)/(38-110) 101/55 (12/03 0900) SpO2:  [92 %-97 %] 94 % (12/03 0900)  GENERAL:NAD HEAD: Normocephalic, atraumatic.  EYES: Pupils equal, round, reactive to light.  No scleral icterus.  MOUTH: Moist mucosal membrane. NECK: Supple. No thyromegaly. No nodules. No JVD.  PULMONARY: CTAB - Right chest tube with good tidal wave and active serosang drainage CARDIOVASCULAR: S1 and S2. Regular rate and rhythm. No murmurs, rubs, or gallops.  GASTROINTESTINAL: Soft, nontender, non-distended. No masses. Positive bowel sounds. No  hepatosplenomegaly.  MUSCULOSKELETAL: No swelling, clubbing, or edema.  NEUROLOGIC: Mild distress due to acute illness SKIN:intact,warm,dry   PERTINENT DATA     Infusions: . sodium chloride Stopped (01/19/19 2154)  . cefTRIAXone (ROCEPHIN)  IV 0 mL/hr at 01/19/19 1541  . dextrose 5 % and 0.45% NaCl 75 mL/hr at 01/20/19 1914  . metronidazole Stopped (01/21/19 0626)   Scheduled Medications: . baclofen  10 mg Oral QHS  . bisacodyl  10 mg Oral Daily  . calcium carbonate  1 tablet Oral BID  . Chlorhexidine Gluconate Cloth  6 each Topical Q0600  . docusate sodium  100 mg Oral BID  . ferrous sulfate  325 mg Oral Daily  . pantoprazole  40 mg Oral Daily  . traMADol  100 mg Oral Q6H   PRN Medications: sodium chloride, acetaminophen **OR** acetaminophen, albuterol, alum & mag hydroxide-simeth, bisacodyl, morphine injection, ondansetron **OR** ondansetron (ZOFRAN) IV, oxyCODONE, polyethylene glycol Hemodynamic parameters:   Intake/Output: 12/02 0701 - 12/03 0700 In: 2550 [I.V.:1750; IV Piggyback:800] Out:  [place] [Urine:890; Blood:150; Chest Tube:655]  Ventilator  Settings:     LAB RESULTS:  Basic Metabolic Panel: Recent Labs  Lab 01/16/19 0516 01/17/19 0540 01/18/19 0515 01/19/19 0512 01/20/19 0428 01/20/19 2044 01/21/19 0511  NA 136 137 136 136 137 134* 132*  K 3.4* 3.8 3.7 3.5 3.6 4.6 4.2  CL 102 104 103 104 106 103 102  CO2 24 20* 20* 22 24 21* 23  GLUCOSE 105* 95 92 100* 94 145* 176*  BUN _0 6* 10 11  CREATININE 0.83 0.74 0.64 0.59* 0.65 0.68 0.83  CALCIUM 7.8* 7.9* 7.6* 7.4* 7.3* 7.6* 7.6*  MG 1.7 1.7 1.6* 1.7 1.6*  --  1.8  PHOS 2.8  --   --   --   --   --   --    Liver Function Tests: No results for input(s): AST, ALT, ALKPHOS, BILITOT, PROT, ALBUMIN in the last 168 hours. No results for input(s): LIPASE, AMYLASE in the last 168 hours. No results for input(s): AMMONIA in the last 168 hours. CBC: Recent Labs  Lab 01/17/19 0540 01/18/19 0515  01/19/19 0512 01/20/19 0428 01/20/19 2044 01/21/19 0511  WBC 16.1* 15.6* 15.8* 11.3* 12.7* 14.4*  NEUTROABS 13.3* 13.1* 13.2* 9.0*  --  12.4*  HGB 12.3* 12.1* 11.8* 11.5* 10.4* 10.7*  HCT 35.5* 35.2* 35.4* 34.6* 31.7* 31.7*  MCV 89.2 89.6 92.2 91.5 91.9 90.1  PLT 477* 489* 504* 456* 386 445*   Cardiac Enzymes: No results for input(s): CKTOTAL, CKMB, CKMBINDEX, TROPONINI in the last 168 hours. BNP: Invalid input(s): POCBNP CBG: Recent Labs  Lab 01/20/19 1954  GLUCAP 133*     IMAGING RESULTS:  Imaging: Dg Chest Port 1 View  Result Date: 01/21/2019 CLINICAL DATA:  Left-sided chest tubes in place. EXAM: PORTABLE CHEST 1 VIEW COMPARISON:  One-view chest x-ray 01/20/2019 FINDINGS: Four left-sided chest tubes are in place. There is no residual pneumothorax. Left-sided subcutaneous emphysema is improving. Aeration of both lungs is improving. Left-sided pulmonary contusion and pleural thickening is again noted. Right pleural fluid is decreasing. IMPRESSION: 1. Stable left-sided chest tubes without pneumothorax. 2. Improving subcutaneous emphysema. 3. Decreasing right pleural effusion. 4. Stable left pulmonary contusion and pleural thickening. Electronically Signed   By: San Morelle M.D.   On: 01/21/2019 07:05   Dg Chest Port 1 View  Result Date: 01/20/2019 CLINICAL DATA:  Empyema. Left lower lobe abscess. EXAM: PORTABLE CHEST 1 VIEW 6:03 p.m. COMPARISON:  01/19/2019 FINDINGS: There are 3 new left-sided chest tubes in place. The empyema and left lower lobe abscess have been drained. No appreciable pneumothorax. Pleural thickening noted laterally. The patient has a new small right pleural effusion. Heart size is normal. Pulmonary vascularity at the upper limits of normal. Aortic atherosclerosis. No acute bone abnormality. Small amount of subcutaneous emphysema on the left. IMPRESSION: Three left chest tubes in place without pneumothorax. Lateral pleural thickening and minimal  atelectasis at the left lung base. New small right pleural effusion. Electronically Signed   By: Lorriane Shire M.D.   On: 01/20/2019 18:32      ASSESSMENT AND PLAN    -Multidisciplinary rounds held today   Left pleural effusion/empyema and LLL abscess  -transferred to ICU s/p left thoracotomy; decortication of visceral and parietal pleural; and intrapleural drainage of left lower lobe abscess Hx: COPD cultures - ntd  Continue ceftriaxone and flagyl-ID evaluation pending Left sided chest tubes to chest drainage system with suction _1  cm-good tidal wave , small air leak, serosang  drainage Supplemental O2 for dyspnea and/or hypoxia  Prn bronchodilator therapy  Pulmonary hygiene Continue morphine, oxycodone, and tramadol for pain management -PT/OT evaluation -CXR reviewed by me - as above   Transient hypotension - s/p IVF rescusitation - had mild hyptension again today s/p 250CC bolus -oxygen as needed ICU telemetry monitoring  Chronic anemia  VTE px: SCD's for now Trend CBC Monitor for s/sx of bleeding and transfuse for hgb <7  Peptic ulcer disease Continue outpatient protonix   ID -continue IV abx as prescibed -follow up cultures  GI/Nutrition GI PROPHYLAXIS as indicated DIET-->TF's as tolerated Constipation protocol as indicated  ENDO - ICU hypoglycemic\Hyperglycemia protocol -check FSBS per protocol   ELECTROLYTES -follow labs as needed -replace as needed -pharmacy consultation   DVT/GI PRX ordered -SCDs  TRANSFUSIONS AS NEEDED MONITOR FSBS ASSESS the need for LABS as needed   Critical care provider statement:    Critical care time (minutes):  32   Critical care time was exclusive of:  Separately billable procedures and treating other patients   Critical care was necessary to treat or prevent imminent or life-threatening deterioration of the following conditions:  left chest empyema, transient hypotension, peptic ulcers, COPD, multiple comorbid  conditions.    Critical care was time spent personally by me on the following activities:  Development of treatment plan with patient or surrogate, discussions with consultants, evaluation of patient's response to treatment, examination of patient, obtaining history from patient or surrogate, ordering and performing treatments and interventions, ordering and review of laboratory studies and re-evaluation of patient's condition.  I assumed direction of critical care for this patient from another provider in my specialty: no    This document was prepared using Dragon voice recognition software and may include unintentional dictation errors.    Ottie Glazier, M.D.  Div[facility] [place]

## 2019-01-21 NOTE — Telephone Encounter (Signed)
I'm aware. Thank you.

## 2019-01-22 ENCOUNTER — Inpatient Hospital Stay: Payer: Medicare Other

## 2019-01-22 DIAGNOSIS — J869 Pyothorax without fistula: Secondary | ICD-10-CM | POA: Diagnosis not present

## 2019-01-22 DIAGNOSIS — C61 Malignant neoplasm of prostate: Secondary | ICD-10-CM | POA: Diagnosis not present

## 2019-01-22 DIAGNOSIS — D649 Anemia, unspecified: Secondary | ICD-10-CM | POA: Diagnosis not present

## 2019-01-22 DIAGNOSIS — Z978 Presence of other specified devices: Secondary | ICD-10-CM

## 2019-01-22 DIAGNOSIS — J852 Abscess of lung without pneumonia: Secondary | ICD-10-CM

## 2019-01-22 LAB — CBC WITH DIFFERENTIAL/PLATELET
Abs Immature Granulocytes: 0.24 10*3/uL — ABNORMAL HIGH (ref 0.00–0.07)
Basophils Absolute: 0 10*3/uL (ref 0.0–0.1)
Basophils Relative: 0 %
Eosinophils Absolute: 0.1 10*3/uL (ref 0.0–0.5)
Eosinophils Relative: 0 %
HCT: 32.8 % — ABNORMAL LOW (ref 39.0–52.0)
Hemoglobin: 10.7 g/dL — ABNORMAL LOW (ref 13.0–17.0)
Immature Granulocytes: 1 %
Lymphocytes Relative: 5 %
Lymphs Abs: 0.9 10*3/uL (ref 0.7–4.0)
MCH: 30.7 pg (ref 26.0–34.0)
MCHC: 32.6 g/dL (ref 30.0–36.0)
MCV: 94.3 fL (ref 80.0–100.0)
Monocytes Absolute: 1.7 10*3/uL — ABNORMAL HIGH (ref 0.1–1.0)
Monocytes Relative: 9 %
Neutro Abs: 15.6 10*3/uL — ABNORMAL HIGH (ref 1.7–7.7)
Neutrophils Relative %: 85 %
Platelets: 507 10*3/uL — ABNORMAL HIGH (ref 150–400)
RBC: 3.48 MIL/uL — ABNORMAL LOW (ref 4.22–5.81)
RDW: 14.5 % (ref 11.5–15.5)
WBC: 18.5 10*3/uL — ABNORMAL HIGH (ref 4.0–10.5)
nRBC: 0 % (ref 0.0–0.2)

## 2019-01-22 LAB — BASIC METABOLIC PANEL
Anion gap: 6 (ref 5–15)
BUN: 14 mg/dL (ref 8–23)
CO2: 24 mmol/L (ref 22–32)
Calcium: 7.5 mg/dL — ABNORMAL LOW (ref 8.9–10.3)
Chloride: 103 mmol/L (ref 98–111)
Creatinine, Ser: 0.91 mg/dL (ref 0.61–1.24)
GFR calc Af Amer: >60 mL/min (ref >60)
GFR calc non Af Amer: >60 mL/min (ref >60)
Glucose, Bld: 138 mg/dL — ABNORMAL HIGH (ref 70–99)
Potassium: 4.3 mmol/L (ref 3.5–5.1)
Sodium: 133 mmol/L — ABNORMAL LOW (ref 135–145)

## 2019-01-22 LAB — SURGICAL PATHOLOGY

## 2019-01-22 MED ORDER — SODIUM CHLORIDE 0.9 % IV SOLN
3.0000 g | Freq: Four times a day (QID) | INTRAVENOUS | Status: DC
  Administered 2019-01-22 – 2019-01-26 (×16): 3 g via INTRAVENOUS
  Filled 2019-01-22 (×3): qty 3
  Filled 2019-01-22: qty 8
  Filled 2019-01-22 (×5): qty 3
  Filled 2019-01-22: qty 8
  Filled 2019-01-22: qty 3
  Filled 2019-01-22 (×2): qty 8
  Filled 2019-01-22: qty 3
  Filled 2019-01-22 (×2): qty 8
  Filled 2019-01-22 (×6): qty 3

## 2019-01-22 NOTE — Consult Note (Signed)
PHARMACY CONSULT NOTE - FOLLOW UP  Pharmacy Consult for Electrolyte Monitoring and Replacement   Recent Labs: Potassium (mmol/L)  Date Value  01/22/2019 4.3   Magnesium (mg/dL)  Date Value  01/21/2019 1.8   Calcium (mg/dL)  Date Value  01/22/2019 7.5 (L)   Albumin (g/dL)  Date Value  01/13/2019 2.5 (L)  01/06/2019 2.9 (L)   Phosphorus (mg/dL)  Date Value  01/16/2019 2.8   Sodium (mmol/L)  Date Value  01/22/2019 133 (L)  01/12/2019 138   Corrected Ca: 8.5 mg/dL  Assessment: 73 y.o. male with left pleural effusion. Pharmacy has been consulted for potassium management.  Goal of Therapy:  Electrolytes WNL   Plan:   MIVF discontinued on 12/4 during AM rounds.   f/u electrolytes with am labs on 12/6  Carolos Fecher L  01/22/2019 5:19 PM

## 2019-01-22 NOTE — Progress Notes (Signed)
Did well overnight.  Minimal pain.  VSS, Afeb  Chest tube drainage is mostly serous Small air leak  Wounds redressed.  All clean and dry.  Labs from today look OK.  Creatinine less than 1.0  UO better today.    He has had difficulty with urination after RT for prostate cancer and requests that Foley stay today - I agree. Appreciate everyone's help in postop care. Awaiting the final path and cultures  Dr. Hampton Abbot to cover this weekend  [employer].

## 2019-01-22 NOTE — Evaluation (Signed)
Physical Therapy Evaluation Patient Details Name: Jonathan Burns MRN: [id number] DOB: [birth year] Today's Date: 01/22/2019   History of Present Illness  Pt is 73 y/o M with PMH anemia, HLD, HTN, BPH, cancer of the prostate s/p radiation, COPD, and GIBs. Pt initially adm to medsurg unit for L plerual effusion/empyema and LLL abscess. S/p Left thoracoscopy with conversion to left thoracotomy; decortication of visceral and parietal pleura; intrapleural drainage of left lower lobe abscess with chest tube placement on 01/20/2019.    Clinical Impression  Patient alert, in bed seen with OT as a co-treat for patient safety, and to maximize mobility. The patient provided PLOF, see below in note for details, but pt previously ambulated without and AD, independent.   The patient was instructed in PT/OT role, expectations, as well as benefits of early mobility. Pt instructed in log rolling technique but began to dry heave, attempt for next visit. Supine to sit with minA, pt reported improvement in nausea once sitting EOB. The patient transferred from EOB to [facility] to recliner in room with several shuffled steps. Reliant on external UE support, exhibited fatigue and weakness. Pt would benefit from trial of RW next session as well.  Overall the patient demonstrated deficits (see "PT Problem List") that impede the patient's functional abilities, safety, and mobility and would benefit from skilled PT intervention. Recommendation is SNF due to acute decline in functional status. Pt very motivated to return home.      Follow Up Recommendations SNF    Equipment Recommendations  Rolling walker with 5" wheels    Recommendations for Other Services OT consult     Precautions / Restrictions Precautions Precautions: Fall Precaution Comments: chest tube Restrictions Weight Bearing Restrictions: No      Mobility  Bed Mobility Overal bed mobility: Needs Assistance Bed Mobility: Supine to Sit     Supine to sit: Min  assist;+2 for safety/equipment;HOB elevated     General bed mobility comments: Pt cued, very little physical assist to complete trunk elevation. Pt with nausea at start of mobility.  Transfers Overall transfer level: Needs assistance Equipment used: 1 person hand held assist Transfers: Sit to/from Stand Sit to Stand: Min assist;+2 safety/equipment;From elevated surface         General transfer comment: also from [facility] with heavy UE assist  Ambulation/Gait Ambulation/Gait assistance: Min assist Gait Distance (Feet): 4 Feet Assistive device: 1 person hand held assist       General Gait Details: short, shuffled steps. Pt reported fatigue/weakness. would benefit from trial run of RW next session  Stairs            Wheelchair Mobility    Modified Rankin (Stroke Patients Only)       Balance Overall balance assessment: Needs assistance Sitting-balance support: Feet supported Sitting balance-Leahy Scale: Good     Standing balance support: Bilateral upper extremity supported Standing balance-Leahy Scale: Fair Standing balance comment: pt with shaking UEs with transfers, requires external source for standing balance, slightly flexed posture                             Pertinent Vitals/Pain Pain Assessment: No/denies pain    Home Living Family/patient expects to be discharged to:: Private residence Living Arrangements: Spouse/significant other Available Help at Discharge: Family Type of Home: House Home Access: Stairs to enter Entrance Stairs-Rails: Can reach both Entrance Stairs-Number of Steps: 3 Home Layout: One level Home Equipment: Walker - 2 wheels;Shower seat -  built in;Grab bars - toilet;Grab bars - tub/shower;Hand held shower head Additional Comments: Pt has AD/DME left over from when his wife suffered a fall approx 2 yrs ago    Prior Function           Comments: Pt reports being Indep with ADLs/IADLs including driving all with no AD/DME      Hand Dominance        Extremity/Trunk Assessment   Upper Extremity Assessment Upper Extremity Assessment: Defer to OT evaluation RUE Deficits / Details: shoulder, elbow, grip 4/5, some shaking through UEs with transfers/push to stand/reach back to control descent. LUE Deficits / Details: shoulder, elbow, grip 4/5    Lower Extremity Assessment Lower Extremity Assessment: (official MMT deferred, pt able to lift LE against gravity, perform heel slide independently.)    Cervical / Trunk Assessment Cervical / Trunk Assessment: (L sided chest tube noted)  Communication   Communication: No difficulties  Cognition Arousal/Alertness: Awake/alert Behavior During Therapy: WFL for tasks assessed/performed Overall Cognitive Status: Within Functional Limits for tasks assessed                                        General Comments      Exercises Other Exercises Other Exercises: Education on pursed lip breathing, log rolling technique, importance of early mobility Other Exercises: OT facilitates education re: safety considerations for both the acute setting and home, e.g.: removal of fall hazards. Pt with demo'ed understanding, but could use some reinforcement.   Assessment/Plan    PT Assessment Patient needs continued PT services  PT Problem List Decreased strength;Decreased balance;Decreased mobility;Decreased activity tolerance;Decreased knowledge of use of DME       PT Treatment Interventions DME instruction;Balance training;Gait training;Neuromuscular re-education;Stair training;Functional mobility training;Patient/family education;Therapeutic activities;Therapeutic exercise;Manual techniques;Wheelchair mobility training    PT Goals (Current goals can be found in the Care Plan section)  Acute Rehab PT Goals Patient Stated Goal: to go home PT Goal Formulation: With patient Time For Goal Achievement: 02/05/19 Potential to Achieve Goals: Good    Frequency  Min 2X/week   Barriers to discharge Decreased caregiver support      Co-evaluation PT/OT/SLP Co-Evaluation/Treatment: Yes Reason for Co-Treatment: Complexity of the patient's impairments (multi-system involvement);For patient/therapist safety PT goals addressed during session: Mobility/safety with mobility OT goals addressed during session: ADL's and self-care;Proper use of Adaptive equipment and DME       AM-PAC PT "6 Clicks" Mobility  Outcome Measure Help needed turning from your back to your side while in a flat bed without using bedrails?: A Little Help needed moving from lying on your back to sitting on the side of a flat bed without using bedrails?: A Little Help needed moving to and from a bed to a chair (including a wheelchair)?: A Little Help needed standing up from a chair using your arms (e.g., wheelchair or bedside chair)?: A Little Help needed to walk in hospital room?: A Lot Help needed climbing 3-5 steps with a railing? : Total 6 Click Score: 15    End of Session Equipment Utilized During Treatment: Gait belt Activity Tolerance: Patient tolerated treatment well;Patient limited by fatigue Patient left: in chair;with call bell/phone within reach Nurse Communication: Mobility status PT Visit Diagnosis: Unsteadiness on feet (R26.81);Other abnormalities of gait and mobility (R26.89);Muscle weakness (generalized) (M62.81);Difficulty in walking, not elsewhere classified (R26.2)    Time: 0037-0488 PT Time Calculation (min) (ACUTE ONLY): 41 min  Charges:   PT Evaluation $PT Eval Low Complexity: 1 Low PT Treatments $Therapeutic Exercise: 8-22 mins        Lieutenant Diego PT, DPT 3:33 PM,01/22/19 [phone]

## 2019-01-22 NOTE — Progress Notes (Signed)
ID Pt says he is feeling better No fever On tramadol for pain control Patient Vitals for the past 24 hrs:  BP Temp Temp src Pulse Resp SpO2  01/22/19 1800 - - - 96 (!) 23 95 %  01/22/19 1700 139/73 - - 99 19 94 %  01/22/19 1600 133/68 98.3 F (36.8 C) Oral 98 (!) 26 94 %  01/22/19 1500 123/75 - - - 19 -  01/22/19 1426 115/71 - - - (!) 24 -  01/22/19 1400 - - - - (!) 23 -  01/22/19 1300 - - - (!) 115 19 94 %  01/22/19 1200 126/70 99.5 F (37.5 C) Oral (!) 110 (!) 22 92 %  01/22/19 1100 124/66 - - 96 (!) 22 93 %  01/22/19 1000 124/62 - - 97 20 94 %  01/22/19 0900 (!) 114/57 - - 98 (!) 21 94 %  01/22/19 0800 113/60 98 F (36.7 C) Oral 94 (!) 21 95 %  01/22/19 0758 113/60 - - 95 (!) 23 93 %  01/22/19 0700 (!) 102/59 - - 91 16 92 %  01/22/19 0600 108/61 - - 93 20 93 %  01/22/19 0500 110/63 - - 92 (!) 22 94 %  01/22/19 0400 114/62 98.8 F (37.1 C) Oral 92 19 91 %  01/22/19 0300 112/62 - - 93 (!) 23 91 %  01/22/19 0200 (!) 106/58 - - 93 (!) 21 90 %  01/22/19 0100 103/60 - - 93 (!) 21 92 %  01/22/19 0000 114/63 98.2 F (36.8 C) Oral 93 (!) 21 94 %  01/21/19 2300 - - - 93 (!) 23 95 %  01/21/19 2200 (!) 102/54 - - 92 (!) 22 92 %  01/21/19 2100 (!) 98/59 - - 89 20 95 %    Awake, alert. Oriented X 4 Chest b/l air entry decreased left side 4 chest drains HS s1s2 Abd soft Foley- urine output 350 so far CNS non focal  Labs CBC Latest Ref Rng & Units 01/22/2019 01/21/2019 01/20/2019  WBC 4.0 - 10.5 K/uL 18.5(H) 14.4(H) 12.7(H)  Hemoglobin 13.0 - 17.0 g/dL 10.7(L) 10.7(L) 10.4(L)  Hematocrit 39.0 - 52.0 % 32.8(L) 31.7(L) 31.7(L)  Platelets 150 - 400 K/uL 507(H) 445(H) 386   CMP Latest Ref Rng & Units 01/22/2019 01/21/2019 01/20/2019  Glucose 70 - 99 mg/dL 138(H) 176(H) 145(H)  BUN 8 - 23 mg/dL 14 11 10   Creatinine 0.61 - 1.24 mg/dL 0.91 0.83 0.68  Sodium 135 - 145 mmol/L 133(L) 132(L) 134(L)  Potassium 3.5 - 5.1 mmol/L 4.3 4.2 4.6  Chloride 98 - 111 mmol/L 103 102 103  CO2 22 - 32  mmol/L 24 23 21(L)  Calcium 8.9 - 10.3 mg/dL 7.5(L) 7.6(L) 7.6(L)  Total Protein 6.5 - 8.1 g/dL - - -  Total Bilirubin 0.3 - 1.2 mg/dL - - -  Alkaline Phos 38 - 126 U/L - - -  AST 15 - 41 U/L - - -  ALT 0 - 44 U/L - - -    Impression/recommendation  Left empyema- s/o thoracotomy and decortication and drainage of intrapleural drainage of left lower lobe abscess Tissue culture gram positive coci in pairs Will change ceftriaxone + flagyl to unasyn- will need for 4 weeks If wbc continies to increase may have repeat chest imaging  Anemia  Prostate CA - s/p IMRT -now on ADT Foley inserted during surgery- discuss with Dr.Oaks for removal  Discussed the management with patient and his nurse

## 2019-01-22 NOTE — Progress Notes (Addendum)
CRITICAL CARE PROGRESS NOTE    Name: Jonathan Burns MRN: [id number] DOB: 02/03/1946     LOS: 31   SUBJECTIVE FINDINGS & SIGNIFICANT EVENTS   Patient description:  73 yo male admitted to the medsurg unit with left pleural effusion/empyema and LLL abscess despite multiple tPA instillations pleural effusion unchanged left thoracotomy; decortication of visceral and parietal pleural; and intrapleural drainage of left lower lobe abscess with chest tube placement transferred to ICU postop for closer monitoring    Lines / Drains: PIVx2  Cultures / Sepsis markers: Pleural empyema culture - pending Surgical/deep wound culture - pending  Antibiotics: Rocephin and Flagyl   12/4 - Patient is clinically improved with less pain around surgical site at left hemithorax.  Eating well, BM this morning. Physical therapy today. Chest tube with good tidal wave, Air leak appreciated at rest, active serosang draining. Incentive spiro using up to 900cc per tidal breath. CXR this am with resolution of Right pleural effusion     PAST MEDICAL HISTORY   Past Medical History:  Diagnosis Date  . Anemia   . BPH (benign prostatic hypertrophy)   . Cancer ([place])   . Chronic GI bleeding   . COPD (chronic obstructive pulmonary disease) ([place])   . Hyperlipidemia   . Hypertension   . Prostate nodule   . PUD (peptic ulcer disease)   . Rising PSA level      SURGICAL HISTORY   Past Surgical History:  Procedure Laterality Date  . COLONOSCOPY  2015  . ESOPHAGOGASTRODUODENOSCOPY  2015  . FLEXIBLE BRONCHOSCOPY Left 01/20/2019   Procedure: FLEXIBLE BRONCHOSCOPY;  Surgeon: Nestor Lewandowsky, MD;  Location: [facility] ORS;  Service: Thoracic;  Laterality: Left;  . THORACOTOMY Left 01/20/2019   Procedure: CONVERTED TO THORACOTOMY MAJOR;  Surgeon: Nestor Lewandowsky, MD;  Location: [facility] ORS;  Service: Thoracic;  Laterality: Left;  Marland Kitchen VIDEO ASSISTED THORACOSCOPY (VATS)/THOROCOTOMY Left 01/20/2019   Procedure: VIDEO ASSISTED THORACOSCOPY (VATS)/THOROCOTOMY;  Surgeon: Nestor Lewandowsky, MD;  Location: [facility] ORS;  Service: Thoracic;  Laterality: Left;     FAMILY HISTORY   Family History  Problem Relation Age of Onset  . Cancer Mother        breast  . Diabetes Mother   . Aneurysm Father   . Kidney disease Neg Hx   . Prostate cancer Neg Hx   . Kidney cancer Neg Hx      SOCIAL HISTORY   Social History   Tobacco Use  . Smoking status: Former Smoker    Packs/day: 1.00    Years: 30.00    Pack years: 30.00    Types: Cigarettes    Quit date: 08/17/1986    Years since quitting: 32.4  . Smokeless tobacco: Never Used  Substance Use Topics  . Alcohol use: Yes    Alcohol/week: 0.0 standard drinks    Comment: occasional  . Drug use: No     MEDICATIONS   Current Medication:  Current Facility-Administered Medications:  .  0.9 %  sodium chloride infusion, , Intravenous, PRN, Nestor Lewandowsky, MD, Stopped at 01/19/19 2154 .  acetaminophen (TYLENOL) tablet 650 mg, 650 mg, Oral, Q6H PRN **OR** acetaminophen (TYLENOL) suppository 650 mg, 650 mg, Rectal, Q6H PRN, Nestor Lewandowsky, MD .  albuterol (PROVENTIL) (2.5 MG/3ML) 0.083% nebulizer solution 2.5 mg, 2.5 mg, Nebulization, Q2H PRN, Nestor Lewandowsky, MD .  alum & mag hydroxide-simeth (MAALOX/MYLANTA) 200-200-20 MG/5ML suspension 30 mL, 30 mL, Oral, Q4H PRN, Nestor Lewandowsky, MD, 30 mL at 01/19/19 1706 .  baclofen (LIORESAL)  tablet 10 mg, 10 mg, Oral, QHS, Nestor Lewandowsky, MD, 10 mg at 01/21/19 2123 .  bisacodyl (DULCOLAX) EC tablet 10 mg, 10 mg, Oral, Daily, Oaks, [place], MD .  bisacodyl (DULCOLAX) EC tablet 5 mg, 5 mg, Oral, Daily PRN, Nestor Lewandowsky, MD .  calcium carbonate (TUMS - dosed in mg elemental calcium) chewable tablet 200 mg of elemental calcium, 1 tablet, Oral, BID, Oaks, Timothy, MD, 200 mg of  elemental calcium at 01/21/19 2121 .  cefTRIAXone (ROCEPHIN) 2 g in sodium chloride 0.9 % 100 mL IVPB, 2 g, Intravenous, Q24H, Ottie Glazier, MD, Stopped at 01/21/19 1821 .  Chlorhexidine Gluconate Cloth 2 % PADS 6 each, 6 each, Topical, Q0600, Lavina Hamman, MD .  dextrose 5 %-0.9 % sodium chloride infusion, , Intravenous, Continuous, Nestor Lewandowsky, MD, Last Rate: 75 mL/hr at 01/22/19 0800 .  docusate sodium (COLACE) capsule 100 mg, 100 mg, Oral, BID, Nestor Lewandowsky, MD, 100 mg at 01/21/19 2121 .  ferrous sulfate tablet 325 mg, 325 mg, Oral, Daily, Nestor Lewandowsky, MD, 325 mg at 01/21/19 1021 .  metroNIDAZOLE (FLAGYL) IVPB 500 mg, 500 mg, Intravenous, Q8H, Nestor Lewandowsky, MD, Stopped at 01/22/19 [phone]  morphine 2 MG/ML injection 1-2 mg, 1-2 mg, Intravenous, Q1H PRN, Nestor Lewandowsky, MD .  ondansetron (ZOFRAN) tablet 4 mg, 4 mg, Oral, Q6H PRN **OR** ondansetron (ZOFRAN) injection 4 mg, 4 mg, Intravenous, Q6H PRN, Nestor Lewandowsky, MD .  oxyCODONE (Oxy IR/ROXICODONE) immediate release tablet 5-10 mg, 5-10 mg, Oral, Q4H PRN, Nestor Lewandowsky, MD .  pantoprazole (PROTONIX) EC tablet 40 mg, 40 mg, Oral, Daily, Nestor Lewandowsky, MD, 40 mg at 01/21/19 1021 .  polyethylene glycol (MIRALAX / GLYCOLAX) packet 17 g, 17 g, Oral, Daily PRN, Nestor Lewandowsky, MD .  traMADol Veatrice Bourbon) tablet 100 mg, 100 mg, Oral, Q6H, Oaks, Christia [place], MD, 100 mg at 01/22/19 5277    ALLERGIES   Patient has no known allergies.    REVIEW OF SYSTEMS     10 point ROS done and is positive for mild pain around surgical wound  PHYSICAL EXAMINATION   Vital Signs: Temp:  [98 F (36.7 C)-99.6 F (37.6 C)] 98 F (36.7 C) (12/04 0800) Pulse Rate:  [84-95] 94 (12/04 0800) Resp:  [16-28] 21 (12/04 0800) BP: (93-114)/(44-63) 113/60 (12/04 0800) SpO2:  [90 %-95 %] 95 % (12/04 0800)  GENERAL:NAD HEAD: Normocephalic, atraumatic.  EYES: Pupils equal, round, reactive to light.  No scleral icterus.  MOUTH: Moist mucosal membrane. NECK:  Supple. No thyromegaly. No nodules. No JVD.  PULMONARY: CTAB with crackles on left worse posteriorly while in supine position  - left chest tube with good tidal wave and active serosang drainage, +Air leak CARDIOVASCULAR: S1 and S2. Regular rate and rhythm. No murmurs, rubs, or gallops.  GASTROINTESTINAL: Soft, nontender, non-distended. No masses. Positive bowel sounds. No hepatosplenomegaly.  MUSCULOSKELETAL: No swelling, clubbing, or edema.  NEUROLOGIC: Mild distress due to acute illness SKIN:intact,warm,dry   PERTINENT DATA     Infusions: . sodium chloride Stopped (01/19/19 2154)  . cefTRIAXone (ROCEPHIN)  IV Stopped (01/21/19 1821)  . dextrose 5 % and 0.9% NaCl 75 mL/hr at 01/22/19 0800  . metronidazole Stopped (01/22/19 8242)   Scheduled Medications: . baclofen  10 mg Oral QHS  . bisacodyl  10 mg Oral Daily  . calcium carbonate  1 tablet Oral BID  . Chlorhexidine Gluconate Cloth  6 each Topical Q0600  . docusate sodium  100 mg Oral BID  . ferrous sulfate  325 mg Oral  Daily  . pantoprazole  40 mg Oral Daily  . traMADol  100 mg Oral Q6H   PRN Medications: sodium chloride, acetaminophen **OR** acetaminophen, albuterol, alum & mag hydroxide-simeth, bisacodyl, morphine injection, ondansetron **OR** ondansetron (ZOFRAN) IV, oxyCODONE, polyethylene glycol Hemodynamic parameters:   Intake/Output: 12/03 0701 - 12/04 0700 In: 3154.3 [I.V.:2440.9; IV Piggyback:713.4] Out: 915 [Urine:375; Chest Tube:540]  Ventilator  Settings:     LAB RESULTS:  Basic Metabolic Panel: Recent Labs  Lab 01/16/19 0516 01/17/19 0540 01/18/19 0515 01/19/19 0512 01/20/19 0428 01/20/19 2044 01/21/19 0511 01/22/19 0433  NA 136 137 136 136 137 134* 132* 133*  K 3.4* 3.8 3.7 3.5 3.6 4.6 4.2 4.3  CL 102 104 103 104 106 103 102 103  CO2 24 20* 20* 22 24 21* 23 24  GLUCOSE 105* 95 92 100* 94 145* 176* 138*  BUN _0 6* _1 CREATININE 0.83 0.74 0.64 0.59* 0.65 0.68 0.83 0.91   CALCIUM 7.8* 7.9* 7.6* 7.4* 7.3* 7.6* 7.6* 7.5*  MG 1.7 1.7 1.6* 1.7 1.6*  --  1.8  --   PHOS 2.8  --   --   --   --   --   --   --    Liver Function Tests: No results for input(s): AST, ALT, ALKPHOS, BILITOT, PROT, ALBUMIN in the last 168 hours. No results for input(s): LIPASE, AMYLASE in the last 168 hours. No results for input(s): AMMONIA in the last 168 hours. CBC: Recent Labs  Lab 01/18/19 0515 01/19/19 0512 01/20/19 0428 01/20/19 2044 01/21/19 0511 01/22/19 0433  WBC 15.6* 15.8* 11.3* 12.7* 14.4* 18.5*  NEUTROABS 13.1* 13.2* 9.0*  --  12.4* 15.6*  HGB 12.1* 11.8* 11.5* 10.4* 10.7* 10.7*  HCT 35.2* 35.4* 34.6* 31.7* 31.7* 32.8*  MCV 89.6 92.2 91.5 91.9 90.1 94.3  PLT 489* 504* 456* 386 445* 507*   Cardiac Enzymes: No results for input(s): CKTOTAL, CKMB, CKMBINDEX, TROPONINI in the last 168 hours. BNP: Invalid input(s): POCBNP CBG: Recent Labs  Lab 01/20/19 1954  GLUCAP 133*     IMAGING RESULTS:  Imaging: Dg Chest Port 1 View  Result Date: 01/21/2019 CLINICAL DATA:  Left-sided chest tubes in place. EXAM: PORTABLE CHEST 1 VIEW COMPARISON:  One-view chest x-ray 01/20/2019 FINDINGS: Four left-sided chest tubes are in place. There is no residual pneumothorax. Left-sided subcutaneous emphysema is improving. Aeration of both lungs is improving. Left-sided pulmonary contusion and pleural thickening is again noted. Right pleural fluid is decreasing. IMPRESSION: 1. Stable left-sided chest tubes without pneumothorax. 2. Improving subcutaneous emphysema. 3. Decreasing right pleural effusion. 4. Stable left pulmonary contusion and pleural thickening. Electronically Signed   By: San Morelle M.D.   On: 01/21/2019 07:05   Dg Chest Port 1 View  Result Date: 01/20/2019 CLINICAL DATA:  Empyema. Left lower lobe abscess. EXAM: PORTABLE CHEST 1 VIEW 6:03 p.m. COMPARISON:  01/19/2019 FINDINGS: There are 3 new left-sided chest tubes in place. The empyema and left lower lobe  abscess have been drained. No appreciable pneumothorax. Pleural thickening noted laterally. The patient has a new small right pleural effusion. Heart size is normal. Pulmonary vascularity at the upper limits of normal. Aortic atherosclerosis. No acute bone abnormality. Small amount of subcutaneous emphysema on the left. IMPRESSION: Three left chest tubes in place without pneumothorax. Lateral pleural thickening and minimal atelectasis at the left lung base. New small right pleural effusion. Electronically Signed   By: Lorriane Shire M.D.   On: 01/20/2019 18:32  ASSESSMENT AND PLAN    -Multidisciplinary rounds held today   Left pleural effusion/empyema and LLL abscess  -transferred to ICU s/p left thoracotomy; decortication of visceral and parietal pleural; and intrapleural drainage of left lower lobe abscess Hx: COPD cultures - 12/3 -GPC-paired - likely strep -ID on case - likely need prolonged abx with IV for at least 2wk Continue ceftriaxone and flagyl-ID evaluation pending Left sided chest tubes to chest drainage system with suction _0  cm-good tidal wave , small air leak, serosang drainage Supplemental O2 for dyspnea and/or hypoxia  Prn bronchodilator therapy  Pulmonary hygiene Continue morphine, oxycodone, and tramadol for pain management -PT/OT evaluation -CXR reviewed by me - as above   Transient hypotension-resolved - s/p IVF rescusitation - had mild hyptension again today s/p 250CC bolus -oxygen as needed ICU telemetry monitoring  Chronic anemia  VTE px: SCD's for now Trend CBC Monitor for s/sx of bleeding and transfuse for hgb <7  Peptic ulcer disease Continue outpatient protonix   ID -continue IV abx as prescibed -follow up cultures  GI/Nutrition GI PROPHYLAXIS as indicated DIET-->TF's as tolerated Constipation protocol as indicated  ENDO - ICU hypoglycemic\Hyperglycemia protocol -check FSBS per protocol   ELECTROLYTES -follow labs as needed  -replace as needed -pharmacy consultation   DVT/GI PRX ordered -SCDs  TRANSFUSIONS AS NEEDED MONITOR FSBS ASSESS the need for LABS as needed   Critical care provider statement:    Critical care time (minutes):  32   Critical care time was exclusive of:  Separately billable procedures and treating other patients   Critical care was necessary to treat or prevent imminent or life-threatening deterioration of the following conditions:  left chest empyema, transient hypotension, peptic ulcers, COPD, multiple comorbid conditions.    Critical care was time spent personally by me on the following activities:  Development of treatment plan with patient or surrogate, discussions with consultants, evaluation of patient's response to treatment, examination of patient, obtaining history from patient or surrogate, ordering and performing treatments and interventions, ordering and review of laboratory studies and re-evaluation of patient's condition.  I assumed direction of critical care for this patient from another provider in my specialty: no    This document was prepared using Dragon voice recognition software and may include unintentional dictation errors.    Ottie Glazier, M.D.  Division of [place]

## 2019-01-22 NOTE — Evaluation (Signed)
Occupational Therapy Evaluation Patient Details Name: Jonathan Burns MRN: [id number] DOB: [birth year] Today's Date: 01/22/2019    History of Present Illness Pt is 73 y/o M with PMH anemia, HLD, HTN, BPH, cancer of the prostate s/p radiation, COPD, and GIBs. Pt initially adm to medsurg unit for L plerual effusion/empyema and LLL abscess. S/p Left thoracoscopy with conversion to left thoracotomy; decortication of visceral and parietal pleura; intrapleural drainage of left lower lobe abscess with chest tube placement on 01/20/2019.   Clinical Impression   Pt seen for OT evaluation this date. Prior to hospital admission, pt was Indep with all ADLs/IADLs.  Pt lives with spouse in [facility] with 3 STE.  Currently pt demonstrates impairments in fxl activity tolerance and decreased strength requiring MIN A +2 people for ADL transfers to manage lines and for safety as well as MOD/MAX A for LB self care.  Pt would benefit from skilled OT to address noted impairments and functional limitations (see below for any additional details) in order to maximize safety and independence while minimizing falls risk and caregiver burden.  Upon hospital discharge, recommend pt discharge to SNF with potential to upgrade d/c dispo to [facility] if pt with improved fxl standing tolerance to perform ADLs safely.    Follow Up Recommendations  SNF(TBD with progress. Potential for HHOT)    Equipment Recommendations  3 in 1 bedside commode    Recommendations for Other Services       Precautions / Restrictions Precautions Precautions: Fall Restrictions Weight Bearing Restrictions: No      Mobility Bed Mobility Overal bed mobility: Needs Assistance Bed Mobility: Supine to Sit     Supine to sit: Min assist;+2 for safety/equipment        Transfers Overall transfer level: Needs assistance   Transfers: Sit to/from Stand Sit to Stand: Min assist;+2 safety/equipment              Balance Overall balance assessment: Needs  assistance Sitting-balance support: Feet supported Sitting balance-Leahy Scale: Good     Standing balance support: Bilateral upper extremity supported Standing balance-Leahy Scale: Fair Standing balance comment: pt with shaking UEs with transfers, requires external source for standing balance, slightly flexed posture                           ADL either performed or assessed with clinical judgement   ADL Overall ADL's : Needs assistance/impaired Eating/Feeding: Set up;Sitting   Grooming: Wash/dry face;Oral care;Set up;Sitting   Upper Body Bathing: Set up;Sitting   Lower Body Bathing: Minimal assistance;Moderate assistance;Sit to/from stand   Upper Body Dressing : Set up;Sitting   Lower Body Dressing: Moderate assistance;Sit to/from stand Lower Body Dressing Details (indicate cue type and reason): difficulty reaching for feet in sitting d/t chest tube related pain Toilet Transfer: Minimal assistance;+2 for safety/equipment;BSC   Toileting- Clothing Manipulation and Hygiene: Maximal assistance;Sit to/from stand;+2 for safety/equipment     Tub/Shower Transfer Details (indicate cue type and reason): NA Functional mobility during ADLs: Minimal assistance;+2 for physical assistance       Vision Baseline Vision/History: Wears glasses Wears Glasses: At all times Patient Visual Report: No change from baseline       Perception     Praxis      Pertinent Vitals/Pain Pain Assessment: No/denies pain     Hand Dominance     Extremity/Trunk Assessment Upper Extremity Assessment Upper Extremity Assessment: RUE deficits/detail;LUE deficits/detail RUE Deficits / Details: shoulder, elbow, grip 4/5, some shaking  through UEs with transfers/push to stand/reach back to control descent. LUE Deficits / Details: shoulder, elbow, grip 4/5   Lower Extremity Assessment Lower Extremity Assessment: Defer to PT evaluation;Overall WFL for tasks assessed       Communication  Communication Communication: No difficulties   Cognition Arousal/Alertness: Awake/alert Behavior During Therapy: WFL for tasks assessed/performed Overall Cognitive Status: Within Functional Limits for tasks assessed                                     General Comments       Exercises Other Exercises Other Exercises: OT facilitates education re: role of OT in acute setting. Pt verbalized understanding. Other Exercises: OT facilitates education re: safety considerations for both the acute setting and home, e.g.: removal of fall hazards. Pt with demo'ed understanding, but could use some reinforcement.   Shoulder Instructions      Home Living Family/patient expects to be discharged to:: Private residence Living Arrangements: Spouse/significant other Available Help at Discharge: Family Type of Home: House Home Access: Stairs to enter Technical brewer of Steps: 3 Entrance Stairs-Rails: Can reach both [place]: One level     Bathroom Shower/Tub: Tub/shower unit         [place]: Environmental consultant - 2 wheels;Shower seat - built in;Grab bars - toilet;Grab bars - tub/shower;Hand held shower head   Additional Comments: Pt has AD/DME left over from when his wife suffered a fall approx 2 yrs ago      Prior Functioning/Environment          Comments: Pt reports being Indep with ADLs/IADLs including driving all with no AD/DME        OT Problem List: Decreased strength;Decreased activity tolerance;Impaired balance (sitting and/or standing);Decreased knowledge of use of DME or AE;Cardiopulmonary status limiting activity      OT Treatment/Interventions: Self-care/ADL training;Therapeutic exercise;Energy conservation;DME and/or AE instruction;Therapeutic activities;Patient/family education;Balance training    OT Goals(Current goals can be found in the care plan section) Acute Rehab OT Goals Patient Stated Goal: to get out of the hospital OT Goal Formulation:  With patient Time For Goal Achievement: 02/05/19 Potential to Achieve Goals: Good  OT Frequency: Min 1X/week   Barriers to D/C:            Co-evaluation PT/OT/SLP Co-Evaluation/Treatment: Yes Reason for Co-Treatment: Complexity of the patient's impairments (multi-system involvement);For patient/therapist safety PT goals addressed during session: Mobility/safety with mobility;Balance OT goals addressed during session: ADL's and self-care;Proper use of Adaptive equipment and DME      AM-PAC OT "6 Clicks" Daily Activity     Outcome Measure Help from another person eating meals?: None Help from another person taking care of personal grooming?: A Little Help from another person toileting, which includes using toliet, bedpan, or urinal?: A Lot Help from another person bathing (including washing, rinsing, drying)?: A Little Help from another person to put on and taking off regular upper body clothing?: None Help from another person to put on and taking off regular lower body clothing?: A Lot 6 Click Score: 18   End of Session Equipment Utilized During Treatment: Gait belt Nurse Communication: Mobility status  Activity Tolerance: Patient tolerated treatment well Patient left: in chair;with call bell/phone within reach  OT Visit Diagnosis: Unsteadiness on feet (R26.81);Muscle weakness (generalized) (M62.81)                Time: 4270-6237 OT Time Calculation (min): 41 min Charges:  OT General Charges $OT Visit: 1 Visit OT Evaluation $OT Eval Moderate Complexity: 1 Mod OT Treatments $Self Care/Home Management : 8-22 mins  Gerrianne Scale, MS, OTR/L ascom [phone] 01/22/19, 2:19 PM

## 2019-01-23 ENCOUNTER — Inpatient Hospital Stay: Payer: Medicare Other

## 2019-01-23 ENCOUNTER — Inpatient Hospital Stay: Payer: Self-pay

## 2019-01-23 LAB — BASIC METABOLIC PANEL
Anion gap: 9 (ref 5–15)
BUN: 11 mg/dL (ref 8–23)
CO2: 23 mmol/L (ref 22–32)
Calcium: 7.5 mg/dL — ABNORMAL LOW (ref 8.9–10.3)
Chloride: 99 mmol/L (ref 98–111)
Creatinine, Ser: 0.66 mg/dL (ref 0.61–1.24)
GFR calc Af Amer: >60 mL/min (ref >60)
GFR calc non Af Amer: >60 mL/min (ref >60)
Glucose, Bld: 106 mg/dL — ABNORMAL HIGH (ref 70–99)
Potassium: 3.2 mmol/L — ABNORMAL LOW (ref 3.5–5.1)
Sodium: 131 mmol/L — ABNORMAL LOW (ref 135–145)

## 2019-01-23 LAB — CBC
HCT: 31.4 % — ABNORMAL LOW (ref 39.0–52.0)
Hemoglobin: 10.9 g/dL — ABNORMAL LOW (ref 13.0–17.0)
MCH: 30.6 pg (ref 26.0–34.0)
MCHC: 34.7 g/dL (ref 30.0–36.0)
MCV: 88.2 fL (ref 80.0–100.0)
Platelets: 401 10*3/uL — ABNORMAL HIGH (ref 150–400)
RBC: 3.56 MIL/uL — ABNORMAL LOW (ref 4.22–5.81)
RDW: 14.2 % (ref 11.5–15.5)
WBC: 17.5 10*3/uL — ABNORMAL HIGH (ref 4.0–10.5)
nRBC: 0 % (ref 0.0–0.2)

## 2019-01-23 LAB — MAGNESIUM: Magnesium: 1.4 mg/dL — ABNORMAL LOW (ref 1.7–2.4)

## 2019-01-23 LAB — PHOSPHORUS: Phosphorus: 2.2 mg/dL — ABNORMAL LOW (ref 2.5–4.6)

## 2019-01-23 MED ORDER — POTASSIUM PHOSPHATES 15 MMOLE/5ML IV SOLN
20.0000 mmol | Freq: Once | INTRAVENOUS | Status: AC
  Administered 2019-01-23: 20 mmol via INTRAVENOUS
  Filled 2019-01-23: qty 6.67

## 2019-01-23 MED ORDER — POTASSIUM CHLORIDE CRYS ER 20 MEQ PO TBCR
40.0000 meq | EXTENDED_RELEASE_TABLET | Freq: Once | ORAL | Status: AC
  Administered 2019-01-23: 40 meq via ORAL
  Filled 2019-01-23: qty 2

## 2019-01-23 MED ORDER — MAGNESIUM SULFATE 2 GM/50ML IV SOLN
2.0000 g | Freq: Once | INTRAVENOUS | Status: AC
  Administered 2019-01-23: 2 g via INTRAVENOUS
  Filled 2019-01-23: qty 50

## 2019-01-23 NOTE — Consult Note (Signed)
PHARMACY CONSULT NOTE - FOLLOW UP  Pharmacy Consult for Electrolyte Monitoring and Replacement   Recent Labs: Potassium (mmol/L)  Date Value  01/23/2019 3.2 (L)   Magnesium (mg/dL)  Date Value  01/23/2019 1.4 (L)   Calcium (mg/dL)  Date Value  01/23/2019 7.5 (L)   Albumin (g/dL)  Date Value  01/13/2019 2.5 (L)  01/06/2019 2.9 (L)   Phosphorus (mg/dL)  Date Value  01/23/2019 2.2 (L)   Sodium (mmol/L)  Date Value  01/23/2019 131 (L)  01/12/2019 138   Corrected Ca: 8.5 mg/dL  Assessment: 73 y.o. male with left pleural effusion. Pharmacy has been consulted for electrolyte replacement.   Goal of Therapy:  Electrolytes WNL   Plan:   MD ordered Magnesium 2 g IV x 1  MD ordered Kphos 20 mmol IV x 1   MD ordered KCl 40 mEq PO x 1.   f/u electrolytes with am labs on 12/6  [name], PharmD, BCPS 01/23/2019 9:51 AM

## 2019-01-23 NOTE — Progress Notes (Signed)
01/23/2019  Subjective: Patient is 3 Days Post-Op s/p left thoracotomy with decortication and drainage of left lower lobe abscess.  No acute events overnight.  Patient's BP has been stable and has not required any boluses.  Reports his pain is well controlled.  Had a BM earlier this morning and worked with PT yesterday.  Vital signs: Temp:  [98.1 F (36.7 C)-99.5 F (37.5 C)] 98.6 F (37 C) (12/05 0800) Pulse Rate:  [91-115] 105 (12/05 0800) Resp:  [18-28] 28 (12/05 0800) BP: (115-143)/(56-75) 127/65 (12/05 0800) SpO2:  [89 %-95 %] 89 % (12/05 0800)   Intake/Output: 12/04 0701 - 12/05 0700 In: 1508.6 [P.O.:720; I.V.:342.1; IV Piggyback:446.5] Out: 1050 [Urine:750; Chest Tube:300] Last BM Date: 01/22/19  Physical Exam: Constitutional: No acute distress Pulm:  4 left chest tubes in place, connected all to one single pleuravac.  There is an airleak more notable on coughing, and low grade with normal expiration.  Serosanguinous fluid in cannister.  Labs:  Recent Labs    01/22/19 0433 01/23/19 0632  WBC 18.5* 17.5*  HGB 10.7* 10.9*  HCT 32.8* 31.4*  PLT 507* 401*   Recent Labs    01/22/19 0433 01/23/19 0632  NA 133* 131*  K 4.3 3.2*  CL 103 99  CO2 24 23  GLUCOSE 138* 106*  BUN 14 11  CREATININE 0.91 0.66  CALCIUM 7.5* 7.5*   No results for input(s): LABPROT, INR in the last 72 hours.  Imaging: Dg Chest Port 1 View  Result Date: 01/23/2019 CLINICAL DATA:  Left chest tube in place. EXAM: PORTABLE CHEST 1 VIEW COMPARISON:  01/22/2019 FINDINGS: There are 4 left-sided chest tubes which appear unchanged in position. No appreciable left-sided pneumothorax identified. Left midlung and left lower lung opacities are unchanged from previous exam. Right lung clear. IMPRESSION: 1. No change in position of left-sided chest tubes. 2. No change in aeration to the left midlung and left lower lung. Electronically Signed   By: Kerby Moors M.D.   On: 01/23/2019 10:02     Assessment/Plan: This is a 73 y.o. male s/p left thoracotomy and decortication for empyema.  --May transfer patient to surgical floor today. --Replete electrolytes -- had hypokalemia, hypophosphatemia, and hypomagnesemia today.  Will repeat labs in AM --Continue IV antibiotics.  ID has recommended 4 week course of Unasyn.  ICU team to place PICC line today. --Continue regular diet, pain medications, bowel regimen --Continue DVT/GI prophylaxis. --OOB, work with [place], [place]

## 2019-01-23 NOTE — Progress Notes (Signed)
CRITICAL CARE PROGRESS NOTE    Name: Jonathan Burns MRN: [id number] DOB: 1945-12-06     LOS: 65   SUBJECTIVE FINDINGS & SIGNIFICANT EVENTS   Patient description:  73 yo male admitted to the medsurg unit with left pleural effusion/empyema and LLL abscess despite multiple tPA instillations pleural effusion unchanged left thoracotomy; decortication of visceral and parietal pleural; and intrapleural drainage of left lower lobe abscess with chest tube placement transferred to ICU postop for closer monitoring    Lines / Drains: PIVx2  Cultures / Sepsis markers: Pleural empyema culture - pending Surgical/deep wound culture - pending  Antibiotics: Rocephin and Flagyl   12/4 - Patient is clinically improved with less pain around surgical site at left hemithorax.  Eating well, BM this morning. Physical therapy today. Chest tube with good tidal wave, Air leak appreciated at rest, active serosang draining. Incentive spiro using up to 900cc per tidal breath. CXR this am with resolution of Right pleural effusion 12/5 - Patient is clinically improved, I discussed his care plan with Dr Ramon Dredge, he he will need PICC line for prolonged IV abx, this has been ordered. His daughter is at bedside and we will anticipate discharge home within 24-48h.      PAST MEDICAL HISTORY   Past Medical History:  Diagnosis Date   Anemia    BPH (benign prostatic hypertrophy)    Cancer (HCC)    Chronic GI bleeding    COPD (chronic obstructive pulmonary disease) ([place])    Hyperlipidemia    Hypertension    Prostate nodule    PUD (peptic ulcer disease)    Rising PSA level      SURGICAL HISTORY   Past Surgical History:  Procedure Laterality Date   COLONOSCOPY  2015   ESOPHAGOGASTRODUODENOSCOPY  2015   FLEXIBLE  BRONCHOSCOPY Left 01/20/2019   Procedure: FLEXIBLE BRONCHOSCOPY;  Surgeon: Nestor Lewandowsky, MD;  Location: [facility] ORS;  Service: Thoracic;  Laterality: Left;   THORACOTOMY Left 01/20/2019   Procedure: CONVERTED TO THORACOTOMY MAJOR;  Surgeon: Nestor Lewandowsky, MD;  Location: [facility] ORS;  Service: Thoracic;  Laterality: Left;   VIDEO ASSISTED THORACOSCOPY (VATS)/THOROCOTOMY Left 01/20/2019   Procedure: VIDEO ASSISTED THORACOSCOPY (VATS)/THOROCOTOMY;  Surgeon: Nestor Lewandowsky, MD;  Location: [facility] ORS;  Service: Thoracic;  Laterality: Left;     FAMILY HISTORY   Family History  Problem Relation Age of Onset   Cancer Mother        breast   Diabetes Mother    Aneurysm Father    Kidney disease Neg Hx    Prostate cancer Neg Hx    Kidney cancer Neg Hx      SOCIAL HISTORY   Social History   Tobacco Use   Smoking status: Former Smoker    Packs/day: 1.00    Years: 30.00    Pack years: 30.00    Types: Cigarettes    Quit date: 08/17/1986    Years since quitting: 32.4   Smokeless tobacco: Never Used  Substance Use Topics   Alcohol use: Yes    Alcohol/week: 0.0 standard drinks    Comment: occasional   Drug use: No     MEDICATIONS   Current Medication:  Current Facility-Administered Medications:    0.9 %  sodium chloride infusion, , Intravenous, PRN, Nestor Lewandowsky, MD, Stopped at 01/19/19 2154   acetaminophen (TYLENOL) tablet 650 mg, 650 mg, Oral, Q6H PRN **OR** acetaminophen (TYLENOL) suppository 650 mg, 650 mg, Rectal, Q6H PRN, Nestor Lewandowsky, MD   albuterol (PROVENTIL) (2.5 MG/3ML)  0.083% nebulizer solution 2.5 mg, 2.5 mg, Nebulization, Q2H PRN, Nestor Lewandowsky, MD   alum & mag hydroxide-simeth (MAALOX/MYLANTA) 200-200-20 MG/5ML suspension 30 mL, 30 mL, Oral, Q4H PRN, Nestor Lewandowsky, MD, 30 mL at 01/19/19 1706   Ampicillin-Sulbactam (UNASYN) 3 g in sodium chloride 0.9 % 100 mL IVPB, 3 g, Intravenous, Q6H, Ravishankar, Joellyn Quails, MD, Last Rate: 200 mL/hr at 01/23/19 0951, 3 g at  01/23/19 0951   baclofen (LIORESAL) tablet 10 mg, 10 mg, Oral, QHS, Oaks, Christia Reading, MD, 10 mg at 01/22/19 2319   bisacodyl (DULCOLAX) EC tablet 10 mg, 10 mg, Oral, Daily, Nestor Lewandowsky, MD, 10 mg at 01/22/19 1044   bisacodyl (DULCOLAX) EC tablet 5 mg, 5 mg, Oral, Daily PRN, Nestor Lewandowsky, MD   calcium carbonate (TUMS - dosed in mg elemental calcium) chewable tablet 200 mg of elemental calcium, 1 tablet, Oral, BID, Nestor Lewandowsky, MD, 200 mg of elemental calcium at 01/23/19 0931   Chlorhexidine Gluconate Cloth 2 % PADS 6 each, 6 each, Topical, Q0600, Lavina Hamman, MD, 6 each at 01/23/19 0615   docusate sodium (COLACE) capsule 100 mg, 100 mg, Oral, BID, Nestor Lewandowsky, MD, 100 mg at 01/22/19 1044   ferrous sulfate tablet 325 mg, 325 mg, Oral, Daily, Nestor Lewandowsky, MD, 325 mg at 01/23/19 0931   morphine 2 MG/ML injection 1-2 mg, 1-2 mg, Intravenous, Q1H PRN, Nestor Lewandowsky, MD   ondansetron (ZOFRAN) tablet 4 mg, 4 mg, Oral, Q6H PRN **OR** ondansetron (ZOFRAN) injection 4 mg, 4 mg, Intravenous, Q6H PRN, Nestor Lewandowsky, MD   oxyCODONE (Oxy IR/ROXICODONE) immediate release tablet 5-10 mg, 5-10 mg, Oral, Q4H PRN, Nestor Lewandowsky, MD   pantoprazole (PROTONIX) EC tablet 40 mg, 40 mg, Oral, Daily, Oaks, Christia Reading, MD, 40 mg at 01/23/19 0931   polyethylene glycol (MIRALAX / GLYCOLAX) packet 17 g, 17 g, Oral, Daily PRN, Nestor Lewandowsky, MD   potassium PHOSPHATE 20 mmol in dextrose 5 % 500 mL infusion, 20 mmol, Intravenous, Once, Piscoya, Jose, MD, Last Rate: 84 mL/hr at 01/23/19 0952, 20 mmol at 01/23/19 0952   traMADol (ULTRAM) tablet 100 mg, 100 mg, Oral, Q6H, Oaks, Christia Reading, MD, 100 mg at 01/23/19 6203    ALLERGIES   Patient has no known allergies.    REVIEW OF SYSTEMS     10 point ROS done and is positive for mild pain around surgical wound  PHYSICAL EXAMINATION   Vital Signs: Temp:  [98.1 F (36.7 C)-99.5 F (37.5 C)] 98.6 F (37 C) (12/05 0800) Pulse Rate:  [91-115] 105 (12/05  0800) Resp:  [18-28] 28 (12/05 0800) BP: (115-143)/(56-75) 127/65 (12/05 0800) SpO2:  [89 %-95 %] 89 % (12/05 0800)  GENERAL:NAD HEAD: Normocephalic, atraumatic.  EYES: Pupils equal, round, reactive to light.  No scleral icterus.  MOUTH: Moist mucosal membrane. NECK: Supple. No thyromegaly. No nodules. No JVD.  PULMONARY: CTAB with crackles on left worse posteriorly while in supine position  - left chest tube with good tidal wave and active serosang drainage, +Air leak CARDIOVASCULAR: S1 and S2. Regular rate and rhythm. No murmurs, rubs, or gallops.  GASTROINTESTINAL: Soft, nontender, non-distended. No masses. Positive bowel sounds. No hepatosplenomegaly.  MUSCULOSKELETAL: No swelling, clubbing, or edema.  NEUROLOGIC: Mild distress due to acute illness SKIN:intact,warm,dry   PERTINENT DATA     Infusions:  sodium chloride Stopped (01/19/19 2154)   ampicillin-sulbactam (UNASYN) IV 3 g (01/23/19 0951)   potassium PHOSPHATE IVPB (in mmol) 20 mmol (01/23/19 0952)   Scheduled Medications:  baclofen  10 mg Oral QHS  bisacodyl  10 mg Oral Daily   calcium carbonate  1 tablet Oral BID   Chlorhexidine Gluconate Cloth  6 each Topical Q0600   docusate sodium  100 mg Oral BID   ferrous sulfate  325 mg Oral Daily   pantoprazole  40 mg Oral Daily   traMADol  100 mg Oral Q6H   PRN Medications: sodium chloride, acetaminophen **OR** acetaminophen, albuterol, alum & mag hydroxide-simeth, bisacodyl, morphine injection, ondansetron **OR** ondansetron (ZOFRAN) IV, oxyCODONE, polyethylene glycol Hemodynamic parameters:   Intake/Output: 12/04 0701 - 12/05 0700 In: 1508.6 [P.O.:720; I.V.:342.1; IV Piggyback:446.5] Out: 1050 [Urine:750; Chest Tube:300]  Ventilator  Settings:     LAB RESULTS:  Basic Metabolic Panel: Recent Labs  Lab 01/18/19 0515 01/19/19 0512 01/20/19 0428 01/20/19 2044 01/21/19 0511 01/22/19 0433 01/23/19 0632  NA 136 136 137 134* 132* 133* 131*  K 3.7  3.5 3.6 4.6 4.2 4.3 3.2*  CL 103 104 106 103 102 103 99  CO2 20* 22 24 21* _0 GLUCOSE 92 100* 94 145* 176* 138* 106*  BUN 11 10 6* _1 CREATININE 0.64 0.59* 0.65 0.68 0.83 0.91 0.66  CALCIUM 7.6* 7.4* 7.3* 7.6* 7.6* 7.5* 7.5*  MG 1.6* 1.7 1.6*  --  1.8  --  1.4*  PHOS  --   --   --   --   --   --  2.2*   Liver Function Tests: No results for input(s): AST, ALT, ALKPHOS, BILITOT, PROT, ALBUMIN in the last 168 hours. No results for input(s): LIPASE, AMYLASE in the last 168 hours. No results for input(s): AMMONIA in the last 168 hours. CBC: Recent Labs  Lab 01/18/19 0515 01/19/19 0512 01/20/19 0428 01/20/19 2044 01/21/19 0511 01/22/19 0433 01/23/19 0632  WBC 15.6* 15.8* 11.3* 12.7* 14.4* 18.5* 17.5*  NEUTROABS 13.1* 13.2* 9.0*  --  12.4* 15.6*  --   HGB 12.1* 11.8* 11.5* 10.4* 10.7* 10.7* 10.9*  HCT 35.2* 35.4* 34.6* 31.7* 31.7* 32.8* 31.4*  MCV 89.6 92.2 91.5 91.9 90.1 94.3 88.2  PLT 489* 504* 456* 386 445* 507* 401*   Cardiac Enzymes: No results for input(s): CKTOTAL, CKMB, CKMBINDEX, TROPONINI in the last 168 hours. BNP: Invalid input(s): POCBNP CBG: Recent Labs  Lab 01/20/19 1954  GLUCAP 133*     IMAGING RESULTS:  Imaging: Dg Chest Port 1 View  Result Date: 01/23/2019 CLINICAL DATA:  Left chest tube in place. EXAM: PORTABLE CHEST 1 VIEW COMPARISON:  01/22/2019 FINDINGS: There are 4 left-sided chest tubes which appear unchanged in position. No appreciable left-sided pneumothorax identified. Left midlung and left lower lung opacities are unchanged from previous exam. Right lung clear. IMPRESSION: 1. No change in position of left-sided chest tubes. 2. No change in aeration to the left midlung and left lower lung. Electronically Signed   By: Kerby Moors M.D.   On: 01/23/2019 10:02   Dg Chest Port 1 View  Result Date: 01/22/2019 CLINICAL DATA:  73 year old male with history of left-sided chest tubes. EXAM: PORTABLE CHEST 1 VIEW COMPARISON:  Chest  x-ray 01/21/2019. FINDINGS: Four left-sided chest tubes are noted, stable in position. No appreciable left pneumothorax. Small volume of left pleural fluid. Ill-defined opacities throughout the left mid to lower lung, similar to the prior examination. Right lung is clear. No right pleural effusion. Heart size is normal. Upper mediastinal contours are within normal limits. Trace amount of subcutaneous emphysema in the left chest wall, similar to the prior study. Skin staples noted overlying  the lateral aspect of the left chest wall. IMPRESSION: 1. Resolution of small right pleural effusion seen on the prior study. Otherwise, unchanged radiographic appearance of the chest, as above. Electronically Signed   By: Vinnie Langton M.D.   On: 01/22/2019 10:05      ASSESSMENT AND PLAN    -Multidisciplinary rounds held today   Left pleural effusion/empyema and LLL abscess  -transferred to ICU s/p left thoracotomy; decortication of visceral and parietal pleural; and intrapleural drainage of left lower lobe abscess Hx: COPD cultures - 12/3 -GPC-paired - likely strep -ID on case - likely need prolonged abx with IV for at least 2wk Continue ceftriaxone and flagyl-ID evaluation pending Left sided chest tubes to chest drainage system with suction _0  cm-good tidal wave , small air leak, serosang drainage Supplemental O2 for dyspnea and/or hypoxia  Prn bronchodilator therapy  Pulmonary hygiene Continue morphine, oxycodone, and tramadol for pain management -PT/OT evaluation -CXR reviewed by me - as above   Transient hypotension-resolved - s/p IVF rescusitation - had mild hyptension again today s/p 250CC bolus -oxygen as needed ICU telemetry monitoring  Chronic anemia  VTE px: SCD's for now Trend CBC Monitor for s/sx of bleeding and transfuse for hgb <7  Peptic ulcer disease Continue outpatient protonix   ID -continue IV abx as prescibed -follow up cultures  GI/Nutrition GI PROPHYLAXIS as  indicated DIET-->TF's as tolerated Constipation protocol as indicated  ENDO - ICU hypoglycemic\Hyperglycemia protocol -check FSBS per protocol   ELECTROLYTES -follow labs as needed -replace as needed -pharmacy consultation   DVT/GI PRX ordered -SCDs  TRANSFUSIONS AS NEEDED MONITOR FSBS ASSESS the need for LABS as needed   Critical care provider statement:    Critical care time (minutes):  32   Critical care time was exclusive of:  Separately billable procedures and treating other patients   Critical care was necessary to treat or prevent imminent or life-threatening deterioration of the following conditions:  left chest empyema, transient hypotension, peptic ulcers, COPD, multiple comorbid conditions.    Critical care was time spent personally by me on the following activities:  Development of treatment plan with patient or surrogate, discussions with consultants, evaluation of patient's response to treatment, examination of patient, obtaining history from patient or surrogate, ordering and performing treatments and interventions, ordering and review of laboratory studies and re-evaluation of patient's condition.  I assumed direction of critical care for this patient from another provider in my specialty: no    This document was prepared using Dragon voice recognition software and may include unintentional dictation errors.    Ottie Glazier, M.D.  Division of [place]

## 2019-01-24 LAB — CBC WITH DIFFERENTIAL/PLATELET
Abs Immature Granulocytes: 0.21 10*3/uL — ABNORMAL HIGH (ref 0.00–0.07)
Basophils Absolute: 0 10*3/uL (ref 0.0–0.1)
Basophils Relative: 0 %
Eosinophils Absolute: 0.1 10*3/uL (ref 0.0–0.5)
Eosinophils Relative: 1 %
HCT: 33.9 % — ABNORMAL LOW (ref 39.0–52.0)
Hemoglobin: 11.2 g/dL — ABNORMAL LOW (ref 13.0–17.0)
Immature Granulocytes: 1 %
Lymphocytes Relative: 7 %
Lymphs Abs: 1.2 10*3/uL (ref 0.7–4.0)
MCH: 30.4 pg (ref 26.0–34.0)
MCHC: 33 g/dL (ref 30.0–36.0)
MCV: 91.9 fL (ref 80.0–100.0)
Monocytes Absolute: 1.6 10*3/uL — ABNORMAL HIGH (ref 0.1–1.0)
Monocytes Relative: 10 %
Neutro Abs: 13.2 10*3/uL — ABNORMAL HIGH (ref 1.7–7.7)
Neutrophils Relative %: 81 %
Platelets: 538 10*3/uL — ABNORMAL HIGH (ref 150–400)
RBC: 3.69 MIL/uL — ABNORMAL LOW (ref 4.22–5.81)
RDW: 14.3 % (ref 11.5–15.5)
WBC: 16.3 10*3/uL — ABNORMAL HIGH (ref 4.0–10.5)
nRBC: 0 % (ref 0.0–0.2)

## 2019-01-24 LAB — BASIC METABOLIC PANEL
Anion gap: 10 (ref 5–15)
BUN: 9 mg/dL (ref 8–23)
CO2: 24 mmol/L (ref 22–32)
Calcium: 7.7 mg/dL — ABNORMAL LOW (ref 8.9–10.3)
Chloride: 98 mmol/L (ref 98–111)
Creatinine, Ser: 0.67 mg/dL (ref 0.61–1.24)
GFR calc Af Amer: >60 mL/min (ref >60)
GFR calc non Af Amer: >60 mL/min (ref >60)
Glucose, Bld: 100 mg/dL — ABNORMAL HIGH (ref 70–99)
Potassium: 3.2 mmol/L — ABNORMAL LOW (ref 3.5–5.1)
Sodium: 132 mmol/L — ABNORMAL LOW (ref 135–145)

## 2019-01-24 LAB — PHOSPHORUS: Phosphorus: 2.4 mg/dL — ABNORMAL LOW (ref 2.5–4.6)

## 2019-01-24 LAB — MAGNESIUM: Magnesium: 1.6 mg/dL — ABNORMAL LOW (ref 1.7–2.4)

## 2019-01-24 MED ORDER — ACETAMINOPHEN 500 MG PO TABS: 1000.0000 mg | ORAL_TABLET | Freq: Four times a day (QID) | ORAL | Status: DC | PRN

## 2019-01-24 MED ORDER — MAGNESIUM SULFATE 2 GM/50ML IV SOLN
2.0000 g | Freq: Once | INTRAVENOUS | Status: AC
  Administered 2019-01-24: 2 g via INTRAVENOUS
  Filled 2019-01-24: qty 50

## 2019-01-24 MED ORDER — TAMSULOSIN HCL 0.4 MG PO CAPS
0.4000 mg | ORAL_CAPSULE | Freq: Every day | ORAL | Status: DC
  Administered 2019-01-24 – 2019-01-28 (×5): 0.4 mg via ORAL
  Filled 2019-01-24 (×5): qty 1

## 2019-01-24 MED ORDER — AMLODIPINE BESYLATE 10 MG PO TABS
10.0000 mg | ORAL_TABLET | Freq: Every day | ORAL | Status: DC
  Administered 2019-01-24 – 2019-01-28 (×5): 10 mg via ORAL
  Filled 2019-01-24 (×5): qty 1

## 2019-01-24 MED ORDER — METOPROLOL TARTRATE 5 MG/5ML IV SOLN
5.0000 mg | Freq: Once | INTRAVENOUS | Status: AC
  Administered 2019-01-24: 5 mg via INTRAVENOUS
  Filled 2019-01-24: qty 5

## 2019-01-24 MED ORDER — SODIUM CHLORIDE 0.9% FLUSH
10.0000 mL | Freq: Two times a day (BID) | INTRAVENOUS | Status: DC
  Administered 2019-01-24 – 2019-01-28 (×7): 10 mL

## 2019-01-24 MED ORDER — BISACODYL 5 MG PO TBEC: 10.0000 mg | DELAYED_RELEASE_TABLET | Freq: Every day | ORAL | Status: DC | PRN

## 2019-01-24 MED ORDER — POTASSIUM CHLORIDE CRYS ER 20 MEQ PO TBCR
40.0000 meq | EXTENDED_RELEASE_TABLET | Freq: Once | ORAL | Status: AC
  Administered 2019-01-24: 11:00:00 40 meq via ORAL
  Filled 2019-01-24: qty 2

## 2019-01-24 MED ORDER — SODIUM CHLORIDE 0.9% FLUSH: 10.0000 mL | INTRAVENOUS | Status: DC | PRN

## 2019-01-24 MED ORDER — POTASSIUM PHOSPHATES 15 MMOLE/5ML IV SOLN
20.0000 mmol | Freq: Once | INTRAVENOUS | Status: AC
  Administered 2019-01-24: 20 mmol via INTRAVENOUS
  Filled 2019-01-24: qty 6.67

## 2019-01-24 MED ORDER — TRAMADOL HCL 50 MG PO TABS
100.0000 mg | ORAL_TABLET | Freq: Four times a day (QID) | ORAL | Status: DC | PRN
  Administered 2019-01-28: 100 mg via ORAL
  Filled 2019-01-24: qty 2

## 2019-01-24 MED ORDER — CARVEDILOL 25 MG PO TABS
25.0000 mg | ORAL_TABLET | Freq: Two times a day (BID) | ORAL | Status: DC
  Administered 2019-01-24 – 2019-01-28 (×8): 25 mg via ORAL
  Filled 2019-01-24 (×8): qty 1

## 2019-01-24 NOTE — Progress Notes (Signed)
Telemetry called Probation officer, said patient had runs of supraventricular tachycardia as high as 180 bpm. MD notified. Will continue to monitor.

## 2019-01-24 NOTE — Progress Notes (Signed)
Peripherally Inserted Central Catheter/Midline Placement  The IV Nurse has discussed with the patient and/or persons authorized to consent for the patient, the purpose of this procedure and the potential benefits and risks involved with this procedure.  The benefits include less needle sticks, lab draws from the catheter, and the patient may be discharged home with the catheter. Risks include, but not limited to, infection, bleeding, blood clot (thrombus formation), and puncture of an artery; nerve damage and irregular heartbeat and possibility to perform a PICC exchange if needed/ordered by physician.  Alternatives to this procedure were also discussed.  Bard Power PICC patient education guide, fact sheet on infection prevention and patient information card has been provided to patient /or left at bedside.    PICC/Midline Placement Documentation  PICC Single Lumen 01/24/19 PICC Right Cephalic 39 cm 1 cm (Active)  Indication for Insertion or Continuance of Line Home intravenous therapies (PICC only) 01/24/19 1008  Exposed Catheter (cm) 1 cm 01/24/19 1008  Site Assessment Clean;Dry;Intact 01/24/19 1008  Line Status Flushed;Saline locked;Blood return noted 01/24/19 1008  Dressing Type Transparent 01/24/19 1008  Dressing Status Clean;Dry;Intact;Antimicrobial disc in place 01/24/19 [place] checked and tightened 01/24/19 1008  Line Adjustment (NICU/IV Team Only) No 01/24/19 1008  Dressing Intervention New dressing 01/24/19 1008  Dressing Change Due 01/31/19 01/24/19 1008       Rolena Infante 01/24/2019, 10:15 AM

## 2019-01-24 NOTE — Consult Note (Signed)
PHARMACY CONSULT NOTE - FOLLOW UP  Pharmacy Consult for Electrolyte Monitoring and Replacement   Recent Labs: Potassium (mmol/L)  Date Value  01/24/2019 3.2 (L)   Magnesium (mg/dL)  Date Value  01/24/2019 1.6 (L)   Calcium (mg/dL)  Date Value  01/24/2019 7.7 (L)   Albumin (g/dL)  Date Value  01/13/2019 2.5 (L)  01/06/2019 2.9 (L)   Phosphorus (mg/dL)  Date Value  01/24/2019 2.4 (L)   Sodium (mmol/L)  Date Value  01/24/2019 132 (L)  01/12/2019 138   Corrected Ca: 8.5 mg/dL  Assessment: 73 y.o. male with left pleural effusion. Pharmacy has been consulted for electrolyte replacement.   Goal of Therapy:  Electrolytes WNL   Plan:   MD ordered Magnesium 2 g IV x 1  MD ordered Kphos 20 mmol IV x 1   MD ordered KCl 40 mEq PO x 1.   f/u electrolytes with am labs on 12/7  [name], PharmD, BCPS 01/24/2019 8:17 AM

## 2019-01-24 NOTE — Progress Notes (Signed)
01/24/2019  Subjective: Patient is 4 Days Post-Op s/p left thoracotomy with decortication and drainage of left lower lobe abscess.  No acute events overnight. Patient was transferred to floor yesterday.  Denies any worsening pain or shortness of breath.  Continues with good bowel function.  Vital signs: Temp:  [97.5 F (36.4 C)-98.4 F (36.9 C)] 97.5 F (36.4 C) (12/06 5797) Pulse Rate:  [100-106] 106 (12/06 0614) Resp:  [19] 19 (12/05 1412) BP: (136-146)/(76-77) 136/77 (12/06 0614) SpO2:  [91 %-93 %] 92 % (12/06 2820)   Intake/Output: 12/05 0701 - 12/06 0700 In: 811.9 [P.O.:240; IV Piggyback:571.9] Out: 540 [Urine:500; Chest Tube:40] Last BM Date: 01/23/19  Physical Exam: Constitutional: No acute distress Pulm:  4 chest tubes on the left side in place, connected to single pleuravac.  Small airleak with expiration.  No evidence of issues with the tubing to be causing the leak.  Thoracotomy incision clean, dry.   Labs:  Recent Labs    01/23/19 0632 01/24/19 0425  WBC 17.5* 16.3*  HGB 10.9* 11.2*  HCT 31.4* 33.9*  PLT 401* 538*   Recent Labs    01/23/19 0632 01/24/19 0425  NA 131* 132*  K 3.2* 3.2*  CL 99 98  CO2 23 24  GLUCOSE 106* 100*  BUN 11 9  CREATININE 0.66 0.67  CALCIUM 7.5* 7.7*   No results for input(s): LABPROT, INR in the last 72 hours.   Assessment/Plan: This is a 73 y.o. male s/p left thoracotomy and decortication  --Continue chest tubes to suction given airleak. May place to waterseal to ambulate temporarily --continue regular diet, bowel regimen --ID team recommended Unasyn course for 4 weeks.  PICC line to be placed today. --Replete electrolytes today.  Check labs in AM again. --CXR in [place], MD [facility]

## 2019-01-25 ENCOUNTER — Inpatient Hospital Stay: Payer: Medicare Other

## 2019-01-25 ENCOUNTER — Ambulatory Visit: Payer: Medicare Other | Admitting: Family Medicine

## 2019-01-25 LAB — MAGNESIUM: Magnesium: 1.7 mg/dL (ref 1.7–2.4)

## 2019-01-25 LAB — BASIC METABOLIC PANEL
Anion gap: 9 (ref 5–15)
BUN: 8 mg/dL (ref 8–23)
CO2: 23 mmol/L (ref 22–32)
Calcium: 7.5 mg/dL — ABNORMAL LOW (ref 8.9–10.3)
Chloride: 100 mmol/L (ref 98–111)
Creatinine, Ser: 0.62 mg/dL (ref 0.61–1.24)
GFR calc Af Amer: >60 mL/min (ref >60)
GFR calc non Af Amer: >60 mL/min (ref >60)
Glucose, Bld: 138 mg/dL — ABNORMAL HIGH (ref 70–99)
Potassium: 3.4 mmol/L — ABNORMAL LOW (ref 3.5–5.1)
Sodium: 132 mmol/L — ABNORMAL LOW (ref 135–145)

## 2019-01-25 LAB — CBC
HCT: 34.5 % — ABNORMAL LOW (ref 39.0–52.0)
Hemoglobin: 11.7 g/dL — ABNORMAL LOW (ref 13.0–17.0)
MCH: 30.1 pg (ref 26.0–34.0)
MCHC: 33.9 g/dL (ref 30.0–36.0)
MCV: 88.7 fL (ref 80.0–100.0)
Platelets: 517 10*3/uL — ABNORMAL HIGH (ref 150–400)
RBC: 3.89 MIL/uL — ABNORMAL LOW (ref 4.22–5.81)
RDW: 14.3 % (ref 11.5–15.5)
WBC: 13.3 10*3/uL — ABNORMAL HIGH (ref 4.0–10.5)
nRBC: 0 % (ref 0.0–0.2)

## 2019-01-25 LAB — PHOSPHORUS: Phosphorus: 2.4 mg/dL — ABNORMAL LOW (ref 2.5–4.6)

## 2019-01-25 MED ORDER — POTASSIUM PHOSPHATE MONOBASIC 500 MG PO TABS
1000.0000 mg | ORAL_TABLET | ORAL | Status: AC
  Administered 2019-01-25 (×2): 1000 mg via ORAL
  Filled 2019-01-25 (×2): qty 2

## 2019-01-25 MED ORDER — POTASSIUM CHLORIDE CRYS ER 20 MEQ PO TBCR
40.0000 meq | EXTENDED_RELEASE_TABLET | Freq: Once | ORAL | Status: AC
  Administered 2019-01-25: 40 meq via ORAL
  Filled 2019-01-25: qty 2

## 2019-01-25 MED ORDER — ENSURE ENLIVE PO LIQD
237.0000 mL | Freq: Two times a day (BID) | ORAL | Status: DC
  Administered 2019-01-25 – 2019-01-28 (×6): 237 mL via ORAL

## 2019-01-25 MED ORDER — ADULT MULTIVITAMIN W/MINERALS CH
1.0000 | ORAL_TABLET | Freq: Every day | ORAL | Status: DC
  Administered 2019-01-26 – 2019-01-28 (×3): 1 via ORAL
  Filled 2019-01-25 (×3): qty 1

## 2019-01-25 NOTE — Care Management Important Message (Signed)
Important Message  Patient Details  Name: Jonathan Burns MRN: [id number] Date of Birth: 11-03-1945   Medicare Important Message Given:  Yes     Dannette Barbara 01/25/2019, 12:26 PM

## 2019-01-25 NOTE — Progress Notes (Signed)
Occupational Therapy Treatment Patient Details Name: Jonathan Burns MRN: [id number] DOB: Dec 26, 1945 Today's Date: 01/25/2019    History of present illness Pt is 73 y/o M with PMH anemia, HLD, HTN, BPH, cancer of the prostate s/p radiation, COPD, and GIBs. Pt initially adm to medsurg unit for L plerual effusion/empyema and LLL abscess. S/p Left thoracoscopy with conversion to left thoracotomy; decortication of visceral and parietal pleura; intrapleural drainage of left lower lobe abscess with chest tube placement on 01/20/2019.   OT comments  Pt see for OT tx this date to f/u re: safety and independence with self care ADLs and fxl mobility. Pt demos improved tolerance this date requiring only MIN A of one person for fxl transfers and MIN/MOD A for toileting assist-clothing mgt and peri care. Pt demos increased tolerance for fxl standing tasks and ADL mobility/transfers. No c/o dizziness throughout. Pt's d/c dispo upgraded. HHOT most appropriate option at this time based on pt's progress and improving general tolerance.    Follow Up Recommendations  Home health OT    Equipment Recommendations  3 in 1 bedside commode    Recommendations for Other Services      Precautions / Restrictions Precautions Precautions: Fall Precaution Comments: chest tube-okay to water seal for fxl mobility Restrictions Weight Bearing Restrictions: No       Mobility Bed Mobility Overal bed mobility: Needs Assistance Bed Mobility: Supine to Sit     Supine to sit: Modified independent (Device/Increase time)     General bed mobility comments: Pt requires increased time and some cueing for mobilizing LEs toward EOB  Transfers Overall transfer level: Needs assistance Equipment used: Rolling walker (2 wheeled) Transfers: Sit to/from Stand Sit to Stand: Min assist         General transfer comment: closer to CGA from EOB, but requires MIN A from regular commode in restroom with lower surface.    Balance  Overall balance assessment: Needs assistance Sitting-balance support: Feet supported Sitting balance-Leahy Scale: Good     Standing balance support: Bilateral upper extremity supported Standing balance-Leahy Scale: Fair Standing balance comment: Pt with increased stability this date. Requires less UE support with use of FWW.                           ADL either performed or assessed with clinical judgement   ADL       Grooming: Standing;Wash/dry hands;Supervision/safety;Min guard                   Toilet Transfer: Minimal assistance;Regular Toilet;Grab bars;Ambulation;RW   Toileting- Clothing Manipulation and Hygiene: Minimal assistance;Moderate assistance;Sit to/from stand       Functional mobility during ADLs: Min guard;Minimal assistance;Rolling walker       Vision Baseline Vision/History: Wears glasses Wears Glasses: At all times Patient Visual Report: No change from baseline     Perception     Praxis      Cognition Arousal/Alertness: Awake/alert Behavior During Therapy: WFL for tasks assessed/performed Overall Cognitive Status: Within Functional Limits for tasks assessed                                          Exercises Other Exercises Other Exercises: OT facilitates education re: discharge recommendations-potential need for HHOT f/u upon d/c as pt with difficulty with toileting and LB dressing at this time. Would be ideal to  asses further with removal of chest tube. Pt and dtr who is present throughout verbalize understanding.   Shoulder Instructions       General Comments      Pertinent Vitals/ Pain       Pain Assessment: No/denies pain  Home Living                                          Prior Functioning/Environment              Frequency  Min 1X/week        Progress Toward Goals  OT Goals(current goals can now be found in the care plan section)  Progress towards OT goals:  Progressing toward goals  Acute Rehab OT Goals Patient Stated Goal: to go home OT Goal Formulation: With patient Time For Goal Achievement: 02/05/19 Potential to Achieve Goals: Good  Plan Discharge plan needs to be updated    Co-evaluation                 AM-PAC OT "6 Clicks" Daily Activity     Outcome Measure   Help from another person eating meals?: None Help from another person taking care of personal grooming?: A Little Help from another person toileting, which includes using toliet, bedpan, or urinal?: A Lot Help from another person bathing (including washing, rinsing, drying)?: A Little Help from another person to put on and taking off regular upper body clothing?: None Help from another person to put on and taking off regular lower body clothing?: A Lot 6 Click Score: 18    End of Session Equipment Utilized During Treatment: Gait belt;Rolling walker  OT Visit Diagnosis: Unsteadiness on feet (R26.81);Muscle weakness (generalized) (M62.81)   Activity Tolerance Patient tolerated treatment well   Patient Left in chair;with call bell/phone within reach   Nurse Communication Mobility status(notieied that pt had BM, notified that pt up in chair)        Time: 1610-9604 OT Time Calculation (min): 35 min  Charges: OT General Charges $OT Visit: 1 Visit OT Treatments $Self Care/Home Management : 8-22 mins $Therapeutic Activity: 8-22 mins  Gerrianne Scale, MS, OTR/L ascom [phone] 01/25/19, 1:47 PM

## 2019-01-25 NOTE — Consult Note (Signed)
PHARMACY CONSULT NOTE - FOLLOW UP  Pharmacy Consult for Electrolyte Monitoring and Replacement   Recent Labs: Potassium (mmol/L)  Date Value  01/25/2019 3.4 (L)   Magnesium (mg/dL)  Date Value  01/25/2019 1.7   Calcium (mg/dL)  Date Value  01/25/2019 7.5 (L)   Albumin (g/dL)  Date Value  01/13/2019 2.5 (L)  01/06/2019 2.9 (L)   Phosphorus (mg/dL)  Date Value  01/25/2019 2.4 (L)   Sodium (mmol/L)  Date Value  01/25/2019 132 (L)  01/12/2019 138   Corrected Ca: 8.5 mg/dL  Assessment: 73 y.o. male with left pleural effusion. Pharmacy has been consulted for electrolyte replacement.   Goal of Therapy:  Electrolytes WNL   Plan:   Will order KCl 40 mEq PO x 1 as well as KPhos 1g PO x 2 doses.  f/u electrolytes with am labs on 12/8  Pearla Dubonnet, PharmD 01/25/2019 9:51 AM

## 2019-01-25 NOTE — Progress Notes (Signed)
Had a good night.  Minimal pain.  Eating better.  Walked in halls.  Not short of breath.  Pathology is negative for tumor or granuloma  Labs are all good Still with minimal air leak Serous chest tube drainage  Wounds redressed - all clean and dry  CXRay from today looks OK.  Will leave chest tubes on suction for today and then begin transition to empyema tubes.  [employer].

## 2019-01-25 NOTE — Progress Notes (Signed)
Initial Nutrition Assessment  DOCUMENTATION CODES:   Obesity unspecified  INTERVENTION:   Ensure Enlive po BID, each supplement provides 350 kcal and 20 grams of protein  MVI daily   NUTRITION DIAGNOSIS:   Increased nutrient needs related to post-op healing as evidenced by increased estimated needs.  GOAL:   Patient will meet greater than or equal to 90% of their needs  MONITOR:   PO intake, Supplement acceptance, Labs, Weight trends, Skin, I & O's  REASON FOR ASSESSMENT:   LOS    ASSESSMENT:   73 y/o male with h/o COPD, PUD, GERD, prostate cancer and iron anemia admitted with left empyema now s/p left thoracotomy with decortication and drainage of left lower lobe abscess 12/2   Pt with fair appetite and oral intake since admit; pt eating anywhere from 25-80% of meals. Pt ate 60% of his supper last night which included spaghetti. Pt ate some cereal for breakfast this morning. Pt reports good appetite and oral intake at baseline. RD will add supplements and MVI to help pt meet his estimated needs. Per chart, pt is down 15lbs(7%) over the past 2 months; this is not significant. No new weight since admit; RD will request weekly weights.   Medications reviewed and include: tums, colace, ferrous sulfate, protonix, KPhos, unasyn  Labs reviewed: Na 132(L), K 3.4(L), P 2.4(L), Mg 1.7 wnl  NUTRITION - FOCUSED PHYSICAL EXAM:    Most Recent Value  Orbital Region  No depletion  Upper Arm Region  No depletion  Thoracic and Lumbar Region  No depletion  Buccal Region  No depletion  Temple Region  No depletion  Clavicle Bone Region  No depletion  Clavicle and Acromion Bone Region  No depletion  Scapular Bone Region  No depletion  Dorsal Hand  No depletion  Patellar Region  No depletion  Anterior Thigh Region  No depletion  Posterior Calf Region  No depletion  Edema (RD Assessment)  None  Hair  Reviewed  Eyes  Reviewed  Mouth  Reviewed  Skin  Reviewed  Nails  Reviewed      Diet Order:   Diet Order            Diet regular Room service appropriate? Yes; Fluid consistency: Thin  Diet effective now             EDUCATION NEEDS:   No education needs have been identified at this time  Skin:  Skin Assessment: Reviewed RN Assessment(incision L chest, chest tube)  Last BM:  12/7- type 5  Height:   Ht Readings from Last 1 Encounters:  01/15/19 5\' 8"  (1.727 m)    Weight:   Wt Readings from Last 1 Encounters:  01/15/19 95.3 kg    Ideal Body Weight:  70 kg  BMI:  Body mass index is 31.93 kg/m.  Estimated Nutritional Needs:   Kcal:  2000-2300kcal/day  Protein:  100-115g/day  Fluid:  >1.8L/day  Koleen Distance MS, RD, LDN Pager #- [phone] Office#- [phone] After Hours Pager: [phone]

## 2019-01-25 NOTE — Progress Notes (Signed)
Physical Therapy Treatment Patient Details Name: Jonathan Burns MRN: [id number] DOB: Sep 13, 1945 Today's Date: 01/25/2019    History of Present Illness Pt is 73 y/o M with PMH anemia, HLD, HTN, BPH, cancer of the prostate s/p radiation, COPD, and GIBs. Pt initially adm to medsurg unit for L plerual effusion/empyema and LLL abscess. S/p Left thoracoscopy with conversion to left thoracotomy; decortication of visceral and parietal pleura; intrapleural drainage of left lower lobe abscess with chest tube placement on 01/20/2019.    PT Comments    Gradual progression in activity tolerance and gait performance.  Tolerating 30' with RW, close sup; reciprocal stepping pattern with slow, cautious gait.  Mod SOB with minimal distance, BORG 7/10, sats 85% on RA.  Additional distance limited as result.  Sats recover >90% within 30-45 seconds with seated rest and pursed lip breathing.    Follow Up Recommendations  Home health PT     Equipment Recommendations  Rolling walker with 5" wheels    Recommendations for Other Services       Precautions / Restrictions Precautions Precautions: Fall Precaution Comments: chest tube-okay to water seal for fxl mobility Restrictions Weight Bearing Restrictions: No    Mobility  Bed Mobility Overal bed mobility: Needs Assistance Bed Mobility: Supine to Sit     Supine to sit: Modified independent (Device/Increase time)     General bed mobility comments: Pt requires increased time and some cueing for mobilizing LEs toward EOB  Transfers Overall transfer level: Needs assistance Equipment used: [employer] Transfers: Sit to/from Stand Sit to Stand: Supervision         General transfer comment: fair/good LE strength and power; slightly guarded due to pain  Ambulation/Gait Ambulation/Gait assistance: Supervision Gait Distance (Feet): 30 Feet Assistive device: Rolling walker (2 wheeled)       General Gait Details: reciprocal stepping pattern with  slow, cautious gait.  Mod SOB with minimal distance, BORG 7/10, sats 85% on RA.  Additional distance limited as result.  Sats recover >90% within 30-45 seconds with seated rest and pursed lip breathing.   Stairs             Wheelchair Mobility    Modified Rankin (Stroke Patients Only)       Balance Overall balance assessment: Needs assistance Sitting-balance support: No upper extremity supported;Feet supported Sitting balance-Leahy Scale: Good     Standing balance support: Bilateral upper extremity supported Standing balance-Leahy Scale: Fair Standing balance comment: Pt with increased stability this date. Requires less UE support with use of FWW.                            Cognition Arousal/Alertness: Awake/alert Behavior During Therapy: WFL for tasks assessed/performed Overall Cognitive Status: Within Functional Limits for tasks assessed                                        Exercises Other Exercises Other Exercises: Sit/stand x5 with RW, close sup-cuing for hand placement; emphasis on pursed lip breathing with activity    General Comments        Pertinent Vitals/Pain Pain Assessment: No/denies pain    Home Living                      Prior Function            PT Goals (current goals  can now be found in the care plan section) Acute Rehab PT Goals Patient Stated Goal: to go home PT Goal Formulation: With patient Time For Goal Achievement: 02/05/19 Potential to Achieve Goals: Good Progress towards PT goals: Progressing toward goals    Frequency    Min 2X/week      PT Plan Current plan remains appropriate    Co-evaluation              AM-PAC PT "6 Clicks" Mobility   Outcome Measure  Help needed turning from your back to your side while in a flat bed without using bedrails?: None Help needed moving from lying on your back to sitting on the side of a flat bed without using bedrails?: None Help needed  moving to and from a bed to a chair (including a wheelchair)?: None Help needed standing up from a chair using your arms (e.g., wheelchair or bedside chair)?: A Little Help needed to walk in hospital room?: A Little Help needed climbing 3-5 steps with a railing? : A Little 6 Click Score: 21    End of Session   Activity Tolerance: Patient tolerated treatment well Patient left: in chair;with call bell/phone within reach Nurse Communication: Mobility status PT Visit Diagnosis: Unsteadiness on feet (R26.81);Other abnormalities of gait and mobility (R26.89);Muscle weakness (generalized) (M62.81);Difficulty in walking, not elsewhere classified (R26.2)     Time: 3704-8889 PT Time Calculation (min) (ACUTE ONLY): 24 min  Charges:  $Gait Training: 8-22 mins $Therapeutic Activity: 8-22 mins                     Fujiko Picazo H. Owens Shark, PT, DPT, NCS 01/25/19, 5:22 PM [phone]

## 2019-01-26 ENCOUNTER — Inpatient Hospital Stay: Payer: Medicare Other

## 2019-01-26 DIAGNOSIS — Z978 Presence of other specified devices: Secondary | ICD-10-CM | POA: Diagnosis not present

## 2019-01-26 DIAGNOSIS — E876 Hypokalemia: Secondary | ICD-10-CM | POA: Diagnosis not present

## 2019-01-26 DIAGNOSIS — J869 Pyothorax without fistula: Secondary | ICD-10-CM | POA: Diagnosis not present

## 2019-01-26 DIAGNOSIS — R197 Diarrhea, unspecified: Secondary | ICD-10-CM | POA: Diagnosis not present

## 2019-01-26 LAB — AEROBIC/ANAEROBIC CULTURE W GRAM STAIN (SURGICAL/DEEP WOUND)
Culture: NO GROWTH
Culture: NO GROWTH

## 2019-01-26 LAB — BASIC METABOLIC PANEL
Anion gap: 10 (ref 5–15)
BUN: 9 mg/dL (ref 8–23)
CO2: 24 mmol/L (ref 22–32)
Calcium: 7.5 mg/dL — ABNORMAL LOW (ref 8.9–10.3)
Chloride: 101 mmol/L (ref 98–111)
Creatinine, Ser: 0.59 mg/dL — ABNORMAL LOW (ref 0.61–1.24)
GFR calc Af Amer: >60 mL/min (ref >60)
GFR calc non Af Amer: >60 mL/min (ref >60)
Glucose, Bld: 109 mg/dL — ABNORMAL HIGH (ref 70–99)
Potassium: 2.8 mmol/L — ABNORMAL LOW (ref 3.5–5.1)
Sodium: 135 mmol/L (ref 135–145)

## 2019-01-26 LAB — MAGNESIUM: Magnesium: 1.8 mg/dL (ref 1.7–2.4)

## 2019-01-26 LAB — PHOSPHORUS: Phosphorus: 2.4 mg/dL — ABNORMAL LOW (ref 2.5–4.6)

## 2019-01-26 LAB — LACTOFERRIN, FECAL, QUALITATIVE: Lactoferrin, Fecal, Qual: NEGATIVE

## 2019-01-26 MED ORDER — SODIUM CHLORIDE 0.9 % IV SOLN
2.0000 g | INTRAVENOUS | Status: DC
  Administered 2019-01-27 – 2019-01-28 (×2): 2 g via INTRAVENOUS
  Filled 2019-01-26 (×2): qty 2

## 2019-01-26 MED ORDER — SODIUM CHLORIDE 0.9 % IV SOLN
2.0000 g | INTRAVENOUS | Status: DC
  Filled 2019-01-26: qty 20

## 2019-01-26 MED ORDER — SACCHAROMYCES BOULARDII 250 MG PO CAPS
250.0000 mg | ORAL_CAPSULE | Freq: Two times a day (BID) | ORAL | Status: DC
  Administered 2019-01-26 – 2019-01-28 (×4): 250 mg via ORAL
  Filled 2019-01-26 (×5): qty 1

## 2019-01-26 MED ORDER — POTASSIUM PHOSPHATES 15 MMOLE/5ML IV SOLN
20.0000 mmol | Freq: Once | INTRAVENOUS | Status: AC
  Administered 2019-01-26: 20 mmol via INTRAVENOUS
  Filled 2019-01-26: qty 6.67

## 2019-01-26 MED ORDER — POTASSIUM CHLORIDE 10 MEQ/100ML IV SOLN
10.0000 meq | INTRAVENOUS | Status: AC
  Administered 2019-01-26 (×2): 10 meq via INTRAVENOUS
  Filled 2019-01-26: qty 100

## 2019-01-26 MED ORDER — POTASSIUM CHLORIDE CRYS ER 20 MEQ PO TBCR
40.0000 meq | EXTENDED_RELEASE_TABLET | Freq: Once | ORAL | Status: AC
  Administered 2019-01-26: 40 meq via ORAL
  Filled 2019-01-26: qty 2

## 2019-01-26 MED ORDER — CHLORHEXIDINE GLUCONATE CLOTH 2 % EX PADS
6.0000 | MEDICATED_PAD | Freq: Every day | CUTANEOUS | Status: DC
  Administered 2019-01-26 – 2019-01-28 (×3): 6 via TOPICAL

## 2019-01-26 MED ORDER — METRONIDAZOLE 500 MG PO TABS
500.0000 mg | ORAL_TABLET | Freq: Three times a day (TID) | ORAL | Status: DC
  Administered 2019-01-26 – 2019-01-28 (×5): 500 mg via ORAL
  Filled 2019-01-26 (×5): qty 1

## 2019-01-26 NOTE — Progress Notes (Signed)
SATURATION QUALIFICATIONS: (This note is used to comply with regulatory documentation for home oxygen)  Patient Saturations on Room Air at Rest =95%  Patient Saturations on Room Air while Ambulating = 79%  Patient Saturations on 2Liters of oxygen while Ambulating =95  Please briefly explain why patient needs home oxygen:

## 2019-01-26 NOTE — Progress Notes (Signed)
ID Daughter at bedside Pt c/o diarrhea since the antibiotics were changed from ceftriaxone to unasyn on 12/4 2 stools/day, also lot of flatus Afraid to eat but appetite good No fever , no pain abdomen  Patient Vitals for the past 24 hrs:  BP Temp Temp src Pulse Resp SpO2  01/26/19 1217 112/62 98.4 F (36.9 C) Oral 84 20 93 %  01/26/19 0430 137/68 97.7 F (36.5 C) Oral 95 18 94 %    O/e awake, alert, does not look sick or septic Chest b/l air entry - 4 drains left side Abd soft, distended, BS present- no tenderness CNS non focal  CBC Latest Ref Rng & Units 01/25/2019 01/24/2019 01/23/2019  WBC 4.0 - 10.5 K/uL 13.3(H) 16.3(H) 17.5(H)  Hemoglobin 13.0 - 17.0 g/dL 11.7(L) 11.2(L) 10.9(L)  Hematocrit 39.0 - 52.0 % 34.5(L) 33.9(L) 31.4(L)  Platelets 150 - 400 K/uL 517(H) 538(H) 401(H)    CMP Latest Ref Rng & Units 01/26/2019 01/25/2019 01/24/2019  Glucose 70 - 99 mg/dL 109(H) 138(H) 100(H)  BUN 8 - 23 mg/dL 9 8 9   Creatinine 0.61 - 1.24 mg/dL 0.59(L) 0.62 0.67  Sodium 135 - 145 mmol/L 135 132(L) 132(L)  Potassium 3.5 - 5.1 mmol/L 2.8(L) 3.4(L) 3.2(L)  Chloride 98 - 111 mmol/L 101 100 98  CO2 22 - 32 mmol/L 24 23 24   Calcium 8.9 - 10.3 mg/dL 7.5(L) 7.5(L) 7.7(L)  Total Protein 6.5 - 8.1 g/dL - - -  Total Bilirubin 0.3 - 1.2 mg/dL - - -  Alkaline Phos 38 - 126 U/L - - -  AST 15 - 41 U/L - - -  ALT 0 - 44 U/L - - -     Fluid and tissue culture from left pleura- no growth   Impression/Recommendation-   Left empyema s/pthoracotomy with decortication and intra pleural drainage of abscess Doing better Has 4 drains- currently on unasyn- will change to ceftriaxone + PO flagyl for ease of administration at home and also because of diarrhea Will need antibiotic for another 2 weeks  Diarrhea- could be antibiotic induced rather than cdiff- will change back to ceftriaxone- and observe  Hypokalemia- from the diarrhea--being replaced  Discussed the management with patient and his  daughter at bedsde

## 2019-01-26 NOTE — Progress Notes (Signed)
  Had a quiet night  No pain. Some loose stools.  Appetite good.  Small air leak. Heart regular Lungs clear  Abd is sott but slightly distended  Wounds clean and dry  Will place chest tube to water seal Repeat CXRay later today  [employer].

## 2019-01-26 NOTE — Consult Note (Signed)
PHARMACY CONSULT NOTE - FOLLOW UP  Pharmacy Consult for Electrolyte Monitoring and Replacement   Recent Labs: Potassium (mmol/L)  Date Value  01/26/2019 2.8 (L)   Magnesium (mg/dL)  Date Value  01/26/2019 1.8   Calcium (mg/dL)  Date Value  01/26/2019 7.5 (L)   Albumin (g/dL)  Date Value  01/13/2019 2.5 (L)  01/06/2019 2.9 (L)   Phosphorus (mg/dL)  Date Value  01/26/2019 2.4 (L)   Sodium (mmol/L)  Date Value  01/26/2019 135  01/12/2019 138   Corrected Ca: 8.5 mg/dL  Assessment: 73 y.o. male with left pleural effusion. Pharmacy has been consulted for electrolyte replacement.   Goal of Therapy:  Electrolytes WNL   Plan:   Will order KCl 1mEq IV x 2 and 40 mEq PO x 1 as well as KPhos 95mmol IV x 1 dose.  f/u electrolytes with am labs on 12/8  Pearla Dubonnet, PharmD 01/26/2019 10:07 AM

## 2019-01-27 DIAGNOSIS — J869 Pyothorax without fistula: Secondary | ICD-10-CM | POA: Diagnosis not present

## 2019-01-27 DIAGNOSIS — R197 Diarrhea, unspecified: Secondary | ICD-10-CM | POA: Diagnosis not present

## 2019-01-27 DIAGNOSIS — R140 Abdominal distension (gaseous): Secondary | ICD-10-CM

## 2019-01-27 DIAGNOSIS — E876 Hypokalemia: Secondary | ICD-10-CM | POA: Diagnosis not present

## 2019-01-27 LAB — BASIC METABOLIC PANEL
Anion gap: 9 (ref 5–15)
BUN: 9 mg/dL (ref 8–23)
CO2: 26 mmol/L (ref 22–32)
Calcium: 7.7 mg/dL — ABNORMAL LOW (ref 8.9–10.3)
Chloride: 103 mmol/L (ref 98–111)
Creatinine, Ser: 0.58 mg/dL — ABNORMAL LOW (ref 0.61–1.24)
GFR calc Af Amer: >60 mL/min (ref >60)
GFR calc non Af Amer: >60 mL/min (ref >60)
Glucose, Bld: 109 mg/dL — ABNORMAL HIGH (ref 70–99)
Potassium: 2.8 mmol/L — ABNORMAL LOW (ref 3.5–5.1)
Sodium: 138 mmol/L (ref 135–145)

## 2019-01-27 LAB — CBC WITH DIFFERENTIAL/PLATELET
Abs Immature Granulocytes: 0.19 10*3/uL — ABNORMAL HIGH (ref 0.00–0.07)
Basophils Absolute: 0.1 10*3/uL (ref 0.0–0.1)
Basophils Relative: 0 %
Eosinophils Absolute: 0.2 10*3/uL (ref 0.0–0.5)
Eosinophils Relative: 1 %
HCT: 34.1 % — ABNORMAL LOW (ref 39.0–52.0)
Hemoglobin: 11.8 g/dL — ABNORMAL LOW (ref 13.0–17.0)
Immature Granulocytes: 1 %
Lymphocytes Relative: 11 %
Lymphs Abs: 1.4 10*3/uL (ref 0.7–4.0)
MCH: 30.2 pg (ref 26.0–34.0)
MCHC: 34.6 g/dL (ref 30.0–36.0)
MCV: 87.2 fL (ref 80.0–100.0)
Monocytes Absolute: 1.4 10*3/uL — ABNORMAL HIGH (ref 0.1–1.0)
Monocytes Relative: 11 %
Neutro Abs: 10.1 10*3/uL — ABNORMAL HIGH (ref 1.7–7.7)
Neutrophils Relative %: 76 %
Platelets: 482 10*3/uL — ABNORMAL HIGH (ref 150–400)
RBC: 3.91 MIL/uL — ABNORMAL LOW (ref 4.22–5.81)
RDW: 15 % (ref 11.5–15.5)
WBC: 13.4 10*3/uL — ABNORMAL HIGH (ref 4.0–10.5)
nRBC: 0 % (ref 0.0–0.2)

## 2019-01-27 LAB — C DIFFICILE QUICK SCREEN W PCR REFLEX
C Diff antigen: NEGATIVE
C Diff interpretation: NOT DETECTED
C Diff toxin: NEGATIVE

## 2019-01-27 LAB — MAGNESIUM: Magnesium: 1.8 mg/dL (ref 1.7–2.4)

## 2019-01-27 LAB — PHOSPHORUS: Phosphorus: 2.8 mg/dL (ref 2.5–4.6)

## 2019-01-27 LAB — POTASSIUM: Potassium: 3.3 mmol/L — ABNORMAL LOW (ref 3.5–5.1)

## 2019-01-27 MED ORDER — POTASSIUM CHLORIDE 10 MEQ/100ML IV SOLN
10.0000 meq | INTRAVENOUS | Status: AC
  Administered 2019-01-27 (×6): 10 meq via INTRAVENOUS
  Filled 2019-01-27 (×3): qty 100

## 2019-01-27 MED ORDER — LOPERAMIDE HCL 2 MG PO CAPS
2.0000 mg | ORAL_CAPSULE | Freq: Two times a day (BID) | ORAL | Status: DC | PRN
  Administered 2019-01-27: 2 mg via ORAL
  Filled 2019-01-27: qty 1

## 2019-01-27 MED ORDER — POTASSIUM CHLORIDE CRYS ER 20 MEQ PO TBCR
40.0000 meq | EXTENDED_RELEASE_TABLET | Freq: Once | ORAL | Status: AC
  Administered 2019-01-27: 40 meq via ORAL
  Filled 2019-01-27: qty 2

## 2019-01-27 NOTE — Progress Notes (Signed)
Physical Therapy Treatment Patient Details Name: Jonathan Burns MRN: [id number] DOB: [birth year] Today's Date: 01/27/2019    History of Present Illness Pt is 73 y/o M with PMH anemia, HLD, HTN, BPH, cancer of the prostate s/p radiation, COPD, and GIBs. Pt initially adm to medsurg unit for L plerual effusion/empyema and LLL abscess. S/p Left thoracoscopy with conversion to left thoracotomy; decortication of visceral and parietal pleura; intrapleural drainage of left lower lobe abscess with chest tube placement on 01/20/2019.    PT Comments    Pt resting in bed upon PT arrival and reporting he has been walking around nursing loop holding onto IV pole multiple times already today (1 lap each time) and also walking to the bathroom on his own.  Pt modified independent with bed mobility; SBA with transfers; and CGA ambulating around nursing loop (used IV pole for support first 20 feet and then no UE support rest of ambulation).  Overall pt steady with ambulation but increased SOB noted towards end of ambulation.  Pt's O2 sats on room air 92% at rest beginning of session and 85% end of ambulation; O2 sats increased to 93% within 1 minute sitting rest break and vc's for pursed lip breathing (CM and pt's nurse notified of above O2 vitals).  Educated pt on pacing, pursed lip breathing, and activity modification d/t SOB with activity and decreased activity tolerance.  Will continue to focus on strengthening, increasing activity tolerance, and progressive ambulation during hospital stay.    Follow Up Recommendations  Home health PT     Equipment Recommendations  None recommended by PT    Recommendations for Other Services OT consult     Precautions / Restrictions Precautions Precautions: Fall Precaution Comments: chest tube (drainage system) Restrictions Weight Bearing Restrictions: No    Mobility  Bed Mobility Overal bed mobility: Modified Independent Bed Mobility: Supine to Sit;Sit to Supine      Supine to sit: Modified independent (Device/Increase time);HOB elevated Sit to supine: Modified independent (Device/Increase time);HOB elevated   General bed mobility comments: mild increased time to perform on own  Transfers Overall transfer level: Needs assistance Equipment used: None Transfers: Sit to/from Stand Sit to Stand: Supervision         General transfer comment: fairly strong stand with single UE support on bed  Ambulation/Gait Ambulation/Gait assistance: Min guard Gait Distance (Feet): 200 Feet Assistive device: IV Pole;None   Gait velocity: mildly decreased   General Gait Details: pt holding onto IV pole 1st 20 feet and then no UE support rest of ambulation distance; step through gait pattern; steady; increased SOB noted towards end of ambulation   Stairs             Wheelchair Mobility    Modified Rankin (Stroke Patients Only)       Balance Overall balance assessment: Needs assistance Sitting-balance support: No upper extremity supported;Feet supported Sitting balance-Leahy Scale: Normal Sitting balance - Comments: steady sitting reaching outside BOS   Standing balance support: No upper extremity supported Standing balance-Leahy Scale: Good Standing balance comment: steady with ambulation (no UE support)                            Cognition Arousal/Alertness: Awake/alert Behavior During Therapy: WFL for tasks assessed/performed Overall Cognitive Status: Within Functional Limits for tasks assessed  Exercises      General Comments General comments (skin integrity, edema, etc.): Mild drainage noted dressings L chest tube site.  Nursing cleared pt for participation in physical therapy.  Pt agreeable to PT session.      Pertinent Vitals/Pain Pain Assessment: No/denies pain  HR 91-101 bpm during session's activities.    Home Living                      Prior  Function            PT Goals (current goals can now be found in the care plan section) Acute Rehab PT Goals Patient Stated Goal: to go home PT Goal Formulation: With patient Time For Goal Achievement: 02/05/19 Potential to Achieve Goals: Good Progress towards PT goals: Progressing toward goals    Frequency    Min 2X/week      PT Plan Current plan remains appropriate    Co-evaluation              AM-PAC PT "6 Clicks" Mobility   Outcome Measure  Help needed turning from your back to your side while in a flat bed without using bedrails?: None Help needed moving from lying on your back to sitting on the side of a flat bed without using bedrails?: None Help needed moving to and from a bed to a chair (including a wheelchair)?: None Help needed standing up from a chair using your arms (e.g., wheelchair or bedside chair)?: A Little Help needed to walk in hospital room?: A Little Help needed climbing 3-5 steps with a railing? : A Little 6 Click Score: 21    End of Session Equipment Utilized During Treatment: Gait belt(belt up high away from chest tube site) Activity Tolerance: Patient tolerated treatment well Patient left: in bed;with call bell/phone within reach(Nurse cleared pt to not have bed alarm on) Nurse Communication: Mobility status;Precautions;Other (comment)(pt's O2 sats during session) PT Visit Diagnosis: Unsteadiness on feet (R26.81);Other abnormalities of gait and mobility (R26.89);Muscle weakness (generalized) (M62.81);Difficulty in walking, not elsewhere classified (R26.2)     Time: 3612-2449 PT Time Calculation (min) (ACUTE ONLY): 23 min  Charges:  $Therapeutic Exercise: 8-22 mins $Therapeutic Activity: 8-22 mins                    Leitha Bleak, PT 01/27/19, 4:16 PM

## 2019-01-27 NOTE — Treatment Plan (Signed)
Diagnosis: Empyema Baseline Creatinine < 1 Culture Result: Negative  No Known Allergies  OPAT Orders Discharge antibiotics: Ceftriaxone 2 grams IV every 24 hours until 02/11/19 Flagyl 500mg  oral every 8 hours until 02/11/19  [facility] Care Per Protocol:  Labs weekly  while on IV antibiotics: _X_ CBC with differential  _X_ CMP   _X_ Please leave PIC in place until doctor has seen patient or been notified  Fax weekly labs promptly to [phone]  Clinic Follow Up Appt:in 10-14 days days   Call [phone] to make appt with Dr.Shrika Milos

## 2019-01-27 NOTE — Consult Note (Signed)
[place] for Electrolyte Monitoring and Replacement   Recent Labs: Potassium (mmol/L)  Date Value  01/27/2019 3.3 (L)   Magnesium (mg/dL)  Date Value  01/27/2019 1.8   Calcium (mg/dL)  Date Value  01/27/2019 7.7 (L)   Albumin (g/dL)  Date Value  01/13/2019 2.5 (L)  01/06/2019 2.9 (L)   Phosphorus (mg/dL)  Date Value  01/27/2019 2.8   Sodium (mmol/L)  Date Value  01/27/2019 138  01/12/2019 138   Corrected Ca: 8.7 mg/dL  Assessment: 73 y.o. male with left pleural effusion. Pharmacy has been consulted for electrolyte replacement.   Goal of Therapy:  Electrolytes WNL   Plan:  Potassium 3.3. Will order potassium 40 mEq PO x 1 and follow up with am labs.  Tawnya Crook, PharmD 01/27/2019 7:16 PM

## 2019-01-27 NOTE — Progress Notes (Signed)
OT Cancellation Note  Patient Details Name: RAPHEAL MASSO MRN: [id number] DOB: 06-05-1945   Cancelled Treatment:    Reason Eval/Treat Not Completed: Patient at procedure or test/ unavailable  MD (Dr. Genevive Bi) presents to pt's room as OT begins tx. Will f/u for OT tx for ADL task modification education as able.   Gerrianne Scale, [place], OTR/L ascom [phone] 01/27/19, 9:56 AM

## 2019-01-27 NOTE — Progress Notes (Signed)
Had a good night.  Walking in halls.  Not short of breath.  No pain.  Diarrhea is better as well  I changed all his dressings today and helped his daughter do the same.  I placed a Heimlich valve in the system.  He has a very small air leak with cough  Wounds are all healing as expected.  K+ is still low and is being repleted.  Went over all his discharge instructions as we plan for home care.  Social workers involved as well.  Will need home IV antibiotics as well as oxygen and nursing care.  All this is in the process of being arranged.    Will need to repeat his K+ today after his current scheduled runs are completed  [employer].

## 2019-01-27 NOTE — TOC Transition Note (Signed)
Transition of Care [facility]) - CM/SW Discharge Note   Patient Details  Name: Jonathan Burns MRN: [id number] Date of Birth: 06-May-1945  [facility] CM/SW Contact:  Weston Anna, LCSW Phone Number: 01/27/2019, 10:41 AM   Clinical Narrative:     Patient is set to discharge home today with home health/ IV infusion services arranged though [facility] is able to provide PT/ RN services- referral completed with Corene Cornea and they are able to accept patient.   [facility] is able to provided infusion therapy and teaching services- confirmed with [place] to provide O2 needs- confirmed with Brad. Portable tank to be delivered at bedside before leaving hospital and concentrator to be delivered to patients address on file.   No other needs at this time. Pam with [facility] to meet with patient/ family to complete teaching and then patient will be set to discharge.  Final next level of care: [place] Barriers to Discharge: No Barriers Identified   Patient Goals and CMS Choice Patient states their goals for this hospitalization and ongoing recovery are:: getting back home CMS Medicare.gov Compare Post Acute Care list provided to:: Patient Represenative (must comment)(Patient daughter)    Discharge Placement                       Discharge Plan and Services                DME Arranged: N/A(Has walker and 3n1 at home)         HH Arranged: PT, RN [facility] Agency: [place] ([place]) Date [place]: 01/27/19 Time [place]: [place] Representative spoke with at [place]: Jason/Pam  Social Determinants of Health ([place]) Interventions     Readmission Risk Interventions No flowsheet data found.

## 2019-01-27 NOTE — Progress Notes (Signed)
ID Pt says there is 25% improvement in diarrhea after changing unasyn to ceftriaxone Still has bloating of abdomen Afraid to eat No fever  O/e Patient Vitals for the past 24 hrs:  BP Temp Temp src Pulse Resp SpO2  01/27/19 1128 (!) 112/55 97.9 F (36.6 C) - 81 19 95 %  01/27/19 0730 137/67 98.6 F (37 C) Oral 87 18 95 %  01/27/19 0433 128/66 (!) 97.4 F (36.3 C) - 88 20 95 %  01/26/19 1958 116/66 98.9 F (37.2 C) Oral 82 17 95 %   Abd soft, distended, bowel sounds exaggerated Left chest wall  4 drains  Impression/recommendation  Left sided empyema s/p throacotomy and decortication- culture neg but that is because he wa son antibiotics prior. Responding to Unasyn , it was changed to ceftriaxone because of severe diarrhea Will need 2 weeks of Iv ceftriaoxne and Po flagyl   Diarrhea- causing severe hypokalemia and abdominal distension Borderline leucocytosis, no fever,  Because of the severity of diarrhea causing significant hypokalemia and abdominal distension and the  need for 2 more weeks of IV antibiotic, Cdiff had to be ruled out. He was not on any laxatives /tube feeds  cdiff test was sent  and it was negative. Asked him to eats solid food and not be on liquids   Severe hypokalemia- can be the reason for abdominal distension- need to be replaced   Discussed the management with the patient and his daughter and his nurse

## 2019-01-27 NOTE — Consult Note (Signed)
PHARMACY CONSULT NOTE - FOLLOW UP  Pharmacy Consult for Electrolyte Monitoring and Replacement   Recent Labs: Potassium (mmol/L)  Date Value  01/27/2019 2.8 (L)   Magnesium (mg/dL)  Date Value  01/27/2019 1.8   Calcium (mg/dL)  Date Value  01/27/2019 7.7 (L)   Albumin (g/dL)  Date Value  01/13/2019 2.5 (L)  01/06/2019 2.9 (L)   Phosphorus (mg/dL)  Date Value  01/27/2019 2.8   Sodium (mmol/L)  Date Value  01/27/2019 138  01/12/2019 138   Corrected Ca: 8.7 mg/dL  Assessment: 73 y.o. male with left pleural effusion. Pharmacy has been consulted for electrolyte replacement.   Goal of Therapy:  Electrolytes WNL   Plan:   K: 2.8, Will order KCl 37mEq IV x 6 doses. Will recheck potassium level@1800  and order further replenishment if necessary.   f/u electrolytes with am labs on 12/8  Pearla Dubonnet, PharmD 01/27/2019 10:12 AM

## 2019-01-28 ENCOUNTER — Other Ambulatory Visit: Payer: Self-pay | Admitting: Infectious Diseases

## 2019-01-28 ENCOUNTER — Other Ambulatory Visit: Payer: Self-pay

## 2019-01-28 ENCOUNTER — Inpatient Hospital Stay: Payer: Medicare Other

## 2019-01-28 DIAGNOSIS — J869 Pyothorax without fistula: Secondary | ICD-10-CM

## 2019-01-28 LAB — BASIC METABOLIC PANEL
Anion gap: 7 (ref 5–15)
BUN: 10 mg/dL (ref 8–23)
CO2: 25 mmol/L (ref 22–32)
Calcium: 7.6 mg/dL — ABNORMAL LOW (ref 8.9–10.3)
Chloride: 103 mmol/L (ref 98–111)
Creatinine, Ser: 0.59 mg/dL — ABNORMAL LOW (ref 0.61–1.24)
GFR calc Af Amer: >60 mL/min (ref >60)
GFR calc non Af Amer: >60 mL/min (ref >60)
Glucose, Bld: 108 mg/dL — ABNORMAL HIGH (ref 70–99)
Potassium: 3.4 mmol/L — ABNORMAL LOW (ref 3.5–5.1)
Sodium: 135 mmol/L (ref 135–145)

## 2019-01-28 LAB — MAGNESIUM: Magnesium: 1.7 mg/dL (ref 1.7–2.4)

## 2019-01-28 MED ORDER — SACCHAROMYCES BOULARDII 250 MG PO CAPS: 250.0000 mg | ORAL_CAPSULE | Freq: Two times a day (BID) | ORAL | 0 refills | Status: DC

## 2019-01-28 MED ORDER — HYDROCODONE-ACETAMINOPHEN 5-325 MG PO TABS: 1.0000 | ORAL_TABLET | ORAL | Status: DC | PRN

## 2019-01-28 MED ORDER — HYDROCODONE-ACETAMINOPHEN 5-325 MG PO TABS: 1.0000 | ORAL_TABLET | ORAL | 0 refills | Status: DC | PRN

## 2019-01-28 MED ORDER — TRAMADOL HCL 50 MG PO TABS: 100.0000 mg | ORAL_TABLET | Freq: Four times a day (QID) | ORAL | 0 refills | Status: DC

## 2019-01-28 MED ORDER — METRONIDAZOLE 500 MG PO TABS: 500.0000 mg | ORAL_TABLET | Freq: Three times a day (TID) | ORAL | 0 refills | Status: DC

## 2019-01-28 MED ORDER — POTASSIUM CHLORIDE CRYS ER 20 MEQ PO TBCR
40.0000 meq | EXTENDED_RELEASE_TABLET | Freq: Once | ORAL | Status: AC
  Administered 2019-01-28: 40 meq via ORAL
  Filled 2019-01-28: qty 2

## 2019-01-28 MED ORDER — CEFTRIAXONE IV (FOR PTA / DISCHARGE USE ONLY): 2.0000 g | INTRAVENOUS | 0 refills | Status: AC

## 2019-01-28 NOTE — TOC Transition Note (Signed)
Transition of Care [facility]) - CM/SW Discharge Note   Patient Details  Name: Jonathan Burns MRN: [id number] Date of Birth: Dec 21, 1945  Transition of Care [facility]) CM/SW Contact:  Shade Flood, LCSW Phone Number: 01/28/2019, 10:50 AM   Clinical Narrative:     Pt is stable for dc today. Catha Gosselin with [facility]. Also updated Brad with [employer] that pt is stable for dc and Brad brought the portable O2 to pt's room and educated pt and family. Home O2 setup will be delivered once pt is home.  There are no other TOC needs for dc.  Final next level of care: [place] Barriers to Discharge: No Barriers Identified   Patient Goals and CMS Choice Patient states their goals for this hospitalization and ongoing recovery are:: getting back home CMS Medicare.gov Compare Post Acute Care list provided to:: Patient Represenative (must comment)(Patient daughter)    Discharge Placement                       Discharge Plan and Services                DME Arranged: Oxygen DME Agency: [employer] Date DME Agency Contacted: 01/28/19   Representative spoke with at DME Agency: [place]: PT, RN [place] Agency: [place] [facility] ([place]) Date [place]: 01/27/19 Time [place]: 6010 Representative spoke with at [place]: Jason/Pam  Social Determinants of Health ([place]) Interventions     Readmission Risk Interventions No flowsheet data found.

## 2019-01-28 NOTE — Discharge Summary (Signed)
Physician Discharge Summary  Patient ID: Jonathan Burns MRN: [id number] DOB/AGE: November 28, 1945 73 y.o.  Admit date: 01/13/2019 Discharge date: 01/29/2019   Discharge Diagnoses:  Active Problems:   HTN (hypertension), benign   Empyema of left pleural space (HCC)   Hypokalemia   Empyema (HCC)   PUD (peptic ulcer disease)   Procedures: Left thoracotomy and decortication of lung   Hospital Course: Admitted to the hospital with extensive left lower lobe consolidation and pleural effusion.  Pleural effusion was tapped and consistent with empyema.  Had drain placed and intrapleural thromobolytics.  This was unsuccessful and he was taken to the OR for a left thoracotomy and decortication.  Postop course eventful for hypokalemia and short bursts of SVT.  Both successfully treated.  At the time of discharge he had 4 chest tubes connected to a Foley bag with intervening Heimlich valve.  Had a very small air leak when placed to water seal.  Will discharge to home with portable oxygen and PICC for IV antibiotics.  Will followup with Thoracic Surgery and ID.    Disposition: Discharge disposition: 01-Home or Self Care       Discharge Instructions    Diet - low sodium heart healthy   Complete by: As directed    Diet - low sodium heart healthy   Complete by: As directed    Discharge wound care:   Complete by: As directed    Cleanse around chest tube sites daily with soap and water.  Gently pat dry.  Cover chest tube sites with 4 X 4 and tape/Tegaderm or large foam dressing.  May shower but do let water run on chest tube sites.  Cleanse stapled wound area with soap and water during shower.   Home infusion instructions [facility] May follow [place] Dosing Protocol; May administer Cathflo as needed to maintain patency of vascular access device.; Flushing of vascular access device: per [facility] Protocol: 0.9% NaCl pre/post medica...   Complete by: As directed    Instructions: May follow [place] Dosing Protocol   Instructions: May administer Cathflo as needed to maintain patency of vascular access device.   Instructions: Flushing of vascular access device: per [facility] Protocol: 0.9% NaCl pre/post medication administration and prn patency; Heparin 100 u/ml, 75ml for implanted ports and Heparin 10u/ml, 61ml for all other central venous catheters.   Instructions: May follow [facility] Anaphylaxis Protocol for First Dose Administration in the home: 0.9% NaCl at 25-50 ml/hr to maintain IV access for protocol meds. Epinephrine 0.3 ml IV/IM PRN and Benadryl 25-50 IV/IM PRN s/s of anaphylaxis.   Instructions: [place] Infusion Coordinator (RN) to assist per patient IV care needs in the home PRN.   Increase activity slowly   Complete by: As directed    Increase activity slowly   Complete by: As directed      Allergies as of 01/28/2019   No Known Allergies     Medication List    STOP taking these medications   amoxicillin-clavulanate 875-125 MG tablet Commonly known as: AUGMENTIN   Fish Oil 1000 MG Caps   hydrochlorothiazide 25 MG tablet Commonly known as: HYDRODIURIL   losartan 100 MG tablet Commonly known as: COZAAR   meloxicam 15 MG tablet Commonly known as: MOBIC   MENS MULTIVITAMIN PLUS PO     TAKE these medications   amLODipine 10 MG tablet Commonly known as: NORVASC Take 1 tablet (10 mg total) by mouth daily.   baclofen 10 MG tablet Commonly known  as: LIORESAL Take 1 tablet (10 mg total) by mouth at bedtime.   carvedilol 25 MG tablet Commonly known as: COREG Take 1 tablet (25 mg total) by mouth 2 (two) times daily with a meal.   cefTRIAXone  IVPB Commonly known as: ROCEPHIN Inject 2 g into the vein daily for 13 days. Indication: Empyema Last Day of Therapy: 02/11/19 Labs - Once weekly:  CBC/D and CMP   HYDROcodone-acetaminophen 5-325 MG tablet Commonly known as: NORCO/VICODIN Take 1-2 tablets by mouth every 4 (four) hours as needed for moderate  pain.   Iron 325 (65 Fe) MG Tabs Take 1 tablet by mouth daily.   metroNIDAZOLE 500 MG tablet Commonly known as: Flagyl Take 1 tablet (500 mg total) by mouth 3 (three) times daily for 14 days.   pantoprazole 40 MG tablet Commonly known as: PROTONIX Take 1 tablet (40 mg total) by mouth daily.   saccharomyces boulardii 250 MG capsule Commonly known as: FLORASTOR Take 1 capsule (250 mg total) by mouth 2 (two) times daily.   tamsulosin 0.4 MG Caps capsule Commonly known as: FLOMAX Take 1 capsule (0.4 mg total) by mouth daily.   traMADol 50 MG tablet Commonly known as: ULTRAM Take 2 tablets (100 mg total) by mouth 4 (four) times daily.            Home Infusion Instuctions  (From admission, onward)         Start     Ordered   01/28/19 0000  Home infusion instructions [facility] May follow [place] Dosing Protocol; May administer Cathflo as needed to maintain patency of vascular access device.; Flushing of vascular access device: per [facility] Protocol: 0.9% NaCl pre/post medica...    Question Answer Comment  Instructions May follow [place] Dosing Protocol   Instructions May administer Cathflo as needed to maintain patency of vascular access device.   Instructions Flushing of vascular access device: per [facility] Protocol: 0.9% NaCl pre/post medication administration and prn patency; Heparin 100 u/ml, 32ml for implanted ports and Heparin 10u/ml, 89ml for all other central venous catheters.   Instructions May follow [facility] Anaphylaxis Protocol for First Dose Administration in the home: 0.9% NaCl at 25-50 ml/hr to maintain IV access for protocol meds. Epinephrine 0.3 ml IV/IM PRN and Benadryl 25-50 IV/IM PRN s/s of anaphylaxis.   Instructions [facility] Infusion Coordinator (RN) to assist per patient IV care needs in the home PRN.      01/28/19 0854           Discharge Care Instructions  (From admission, onward)         Start     Ordered   01/28/19 0000   Discharge wound care:    Comments: Cleanse around chest tube sites daily with soap and water.  Gently pat dry.  Cover chest tube sites with 4 X 4 and tape/Tegaderm or large foam dressing.  May shower but do let water run on chest tube sites.  Cleanse stapled wound area with soap and water during shower.   01/28/19 1610         Follow-up Information    Nestor Lewandowsky, MD. Go on 02/01/2019.   Specialties: Cardiothoracic Surgery, General Surgery Why: at 11 AM CXR prior to appointment, s/p left thoracotomy  Contact information: [address] [place] [address] [phone]        Tsosie Billing, MD. Go on 02/02/2019.   Specialty: Infectious Diseases Why: 10 AM Contact information: [place]  [place] [address] [phone]           Nestor Lewandowsky, MD

## 2019-01-28 NOTE — Consult Note (Signed)
[facility] for Electrolyte Monitoring and Replacement   Recent Labs: Potassium (mmol/L)  Date Value  01/28/2019 3.4 (L)   Magnesium (mg/dL)  Date Value  01/28/2019 1.7   Calcium (mg/dL)  Date Value  01/28/2019 7.6 (L)   Albumin (g/dL)  Date Value  01/13/2019 2.5 (L)  01/06/2019 2.9 (L)   Phosphorus (mg/dL)  Date Value  01/27/2019 2.8   Sodium (mmol/L)  Date Value  01/28/2019 135  01/12/2019 138   Corrected Ca: 8.7 mg/dL  Assessment: 73 y.o. male with left pleural effusion. Pharmacy has been consulted for electrolyte replacement.   Goal of Therapy:  Electrolytes WNL   Plan:  Potassium 3.4, Magnesium 1.7. Will order potassium 40 mEq PO x 1 and follow up with am labs.  Pearla Dubonnet, PharmD 01/28/2019 7:44 AM

## 2019-01-28 NOTE — Progress Notes (Signed)
Ate well.  Diarrhea much better.  Appetite is good.  Walked several times yesterday without incident.    Lungs are clear but decreased at left base Heart reg Abdomen is distended but nontender.    Wounds clean and dry  Potassium 3.4 this morning  Serous drainage from chest tubes  We will repeat his CXRay this morning and disharge to home if it looks OK. I will see him next week in the office.  Repeat labs and CXRay next week  [employer].

## 2019-01-28 NOTE — Care Management Important Message (Signed)
Important Message  Patient Details  Name: Jonathan Burns MRN: [id number] Date of Birth: 15-Apr-1945   Medicare Important Message Given:  No  Patient discharged prior to arrival to unit to deliver concurrent Medicare IM.   Dannette Barbara 01/28/2019, 2:15 PM

## 2019-01-28 NOTE — Consult Note (Signed)
PHARMACY CONSULT NOTE FOR:  OUTPATIENT  PARENTERAL ANTIBIOTIC THERAPY (OPAT)  Indication: Empyema Regimen: Ceftriaxone 2 g IV q24h (also on metronidazole 500 mg PO q8h) End date: 02/11/19  IV antibiotic discharge orders are pended. To discharging provider:  please sign these orders via discharge navigator,  Select New Orders & click on the button choice - Manage This Unsigned Work.   Thank you for allowing pharmacy to be a part of this patient's care.  Jonathan Burns 01/28/2019, 7:53 AM

## 2019-01-28 NOTE — Progress Notes (Signed)
Jonathan Burns  A and O x 4. VSS. Pt tolerating diet well. No complaints of pain or nausea. IV removed intact, prescriptions given. Pt will be going home with single lumen PICC line for IV antibiotics. Pt voiced understanding of discharge instructions with no further questions. Pt discharged via wheelchair with NT.     Allergies as of 01/28/2019   No Known Allergies     Medication List    STOP taking these medications   amoxicillin-clavulanate 875-125 MG tablet Commonly known as: AUGMENTIN   Fish Oil 1000 MG Caps   hydrochlorothiazide 25 MG tablet Commonly known as: HYDRODIURIL   losartan 100 MG tablet Commonly known as: COZAAR   meloxicam 15 MG tablet Commonly known as: MOBIC   MENS MULTIVITAMIN PLUS PO     TAKE these medications   amLODipine 10 MG tablet Commonly known as: NORVASC Take 1 tablet (10 mg total) by mouth daily.   baclofen 10 MG tablet Commonly known as: LIORESAL Take 1 tablet (10 mg total) by mouth at bedtime.   carvedilol 25 MG tablet Commonly known as: COREG Take 1 tablet (25 mg total) by mouth 2 (two) times daily with a meal.   cefTRIAXone  IVPB Commonly known as: ROCEPHIN Inject 2 g into the vein daily for 13 days. Indication: Empyema Last Day of Therapy: 02/11/19 Labs - Once weekly:  CBC/D and CMP Start taking on: January 29, 2019   HYDROcodone-acetaminophen 5-325 MG tablet Commonly known as: NORCO/VICODIN Take 1-2 tablets by mouth every 4 (four) hours as needed for moderate pain.   Iron 325 (65 Fe) MG Tabs Take 1 tablet by mouth daily.   metroNIDAZOLE 500 MG tablet Commonly known as: Flagyl Take 1 tablet (500 mg total) by mouth 3 (three) times daily for 14 days.   pantoprazole 40 MG tablet Commonly known as: PROTONIX Take 1 tablet (40 mg total) by mouth daily.   saccharomyces boulardii 250 MG capsule Commonly known as: FLORASTOR Take 1 capsule (250 mg total) by mouth 2 (two) times daily.   tamsulosin 0.4 MG Caps capsule Commonly  known as: FLOMAX Take 1 capsule (0.4 mg total) by mouth daily.   traMADol 50 MG tablet Commonly known as: ULTRAM Take 2 tablets (100 mg total) by mouth 4 (four) times daily.            Home Infusion Instuctions  (From admission, onward)         Start     Ordered   01/28/19 0000  Home infusion instructions [facility] May follow [place] Dosing Protocol; May administer Cathflo as needed to maintain patency of vascular access device.; Flushing of vascular access device: per [facility] Protocol: 0.9% NaCl pre/post medica...    Question Answer Comment  Instructions May follow [place] Dosing Protocol   Instructions May administer Cathflo as needed to maintain patency of vascular access device.   Instructions Flushing of vascular access device: per [facility] Protocol: 0.9% NaCl pre/post medication administration and prn patency; Heparin 100 u/ml, 33ml for implanted ports and Heparin 10u/ml, 75ml for all other central venous catheters.   Instructions May follow [facility] Anaphylaxis Protocol for First Dose Administration in the home: 0.9% NaCl at 25-50 ml/hr to maintain IV access for protocol meds. Epinephrine 0.3 ml IV/IM PRN and Benadryl 25-50 IV/IM PRN s/s of anaphylaxis.   Instructions [facility] Infusion Coordinator (RN) to assist per patient IV care needs in the home PRN.      01/28/19 3785  Durable Medical Equipment  (From admission, onward)         Start     Ordered   01/27/19 1228  For home use only DME oxygen  Once    Question Answer Comment  Length of Need 6 Months   Mode or (Route) Nasal cannula   Liters per Minute 2   Frequency Continuous (stationary and portable oxygen unit needed)   Oxygen conserving device Yes   Oxygen delivery system Gas      01/27/19 1227           Discharge Care Instructions  (From admission, onward)         Start     Ordered   01/28/19 0000  Discharge wound care:    Comments: Cleanse around chest tube sites daily  with soap and water.  Gently pat dry.  Cover chest tube sites with 4 X 4 and tape/Tegaderm or large foam dressing.  May shower but do let water run on chest tube sites.  Cleanse stapled wound area with soap and water during shower.   01/28/19 0859          Vitals:   01/28/19 0443 01/28/19 0859  BP: (!) 119/59 (!) 120/58  Pulse: 81 88  Resp: 18   Temp: 97.7 F (36.5 C)   SpO2: 95%     Jonathan Burns

## 2019-01-29 ENCOUNTER — Encounter: Payer: Self-pay | Admitting: Emergency Medicine

## 2019-01-29 ENCOUNTER — Telehealth: Payer: Self-pay | Admitting: Emergency Medicine

## 2019-01-29 ENCOUNTER — Telehealth: Payer: Self-pay | Admitting: *Deleted

## 2019-01-29 DIAGNOSIS — E876 Hypokalemia: Secondary | ICD-10-CM | POA: Diagnosis not present

## 2019-01-29 DIAGNOSIS — Z792 Long term (current) use of antibiotics: Secondary | ICD-10-CM | POA: Diagnosis not present

## 2019-01-29 DIAGNOSIS — D649 Anemia, unspecified: Secondary | ICD-10-CM | POA: Diagnosis not present

## 2019-01-29 DIAGNOSIS — Z923 Personal history of irradiation: Secondary | ICD-10-CM | POA: Diagnosis not present

## 2019-01-29 DIAGNOSIS — I10 Essential (primary) hypertension: Secondary | ICD-10-CM | POA: Diagnosis not present

## 2019-01-29 DIAGNOSIS — E785 Hyperlipidemia, unspecified: Secondary | ICD-10-CM | POA: Diagnosis not present

## 2019-01-29 DIAGNOSIS — J449 Chronic obstructive pulmonary disease, unspecified: Secondary | ICD-10-CM | POA: Diagnosis not present

## 2019-01-29 DIAGNOSIS — Z8719 Personal history of other diseases of the digestive system: Secondary | ICD-10-CM | POA: Diagnosis not present

## 2019-01-29 DIAGNOSIS — Z79891 Long term (current) use of opiate analgesic: Secondary | ICD-10-CM | POA: Diagnosis not present

## 2019-01-29 DIAGNOSIS — Z8546 Personal history of malignant neoplasm of prostate: Secondary | ICD-10-CM | POA: Diagnosis not present

## 2019-01-29 DIAGNOSIS — Z48813 Encounter for surgical aftercare following surgery on the respiratory system: Secondary | ICD-10-CM | POA: Diagnosis not present

## 2019-01-29 DIAGNOSIS — Z452 Encounter for adjustment and management of vascular access device: Secondary | ICD-10-CM | POA: Diagnosis not present

## 2019-01-29 DIAGNOSIS — N400 Enlarged prostate without lower urinary tract symptoms: Secondary | ICD-10-CM | POA: Diagnosis not present

## 2019-01-29 DIAGNOSIS — K279 Peptic ulcer, site unspecified, unspecified as acute or chronic, without hemorrhage or perforation: Secondary | ICD-10-CM | POA: Diagnosis not present

## 2019-01-29 DIAGNOSIS — K219 Gastro-esophageal reflux disease without esophagitis: Secondary | ICD-10-CM | POA: Diagnosis not present

## 2019-01-29 DIAGNOSIS — Z5181 Encounter for therapeutic drug level monitoring: Secondary | ICD-10-CM | POA: Diagnosis not present

## 2019-01-29 DIAGNOSIS — Z87891 Personal history of nicotine dependence: Secondary | ICD-10-CM | POA: Diagnosis not present

## 2019-01-29 DIAGNOSIS — J869 Pyothorax without fistula: Secondary | ICD-10-CM | POA: Diagnosis not present

## 2019-01-29 DIAGNOSIS — J852 Abscess of lung without pneumonia: Secondary | ICD-10-CM | POA: Diagnosis not present

## 2019-01-29 DIAGNOSIS — Z4682 Encounter for fitting and adjustment of non-vascular catheter: Secondary | ICD-10-CM | POA: Diagnosis not present

## 2019-01-29 NOTE — Telephone Encounter (Signed)
[facility] called asking for Left Chest tube wound care orders. Please return her call with orders [phone]

## 2019-01-29 NOTE — Telephone Encounter (Signed)
Please forward to [employer].

## 2019-01-29 NOTE — Telephone Encounter (Signed)
Verbal Order given to [facility] [phone] to change patients wound dressings around the chest tube sites.

## 2019-01-29 NOTE — Telephone Encounter (Signed)
Call returned to [facility] and she will call Dr Genevive Bi

## 2019-02-01 ENCOUNTER — Encounter: Payer: Self-pay | Admitting: Cardiothoracic Surgery

## 2019-02-01 ENCOUNTER — Other Ambulatory Visit: Payer: Self-pay

## 2019-02-01 ENCOUNTER — Ambulatory Visit
Admission: RE | Admit: 2019-02-01 | Discharge: 2019-02-01 | Disposition: A | Discharge status: Home / Self Care | Payer: Medicare Other | Attending: Cardiothoracic Surgery | Admitting: Cardiothoracic Surgery

## 2019-02-01 ENCOUNTER — Ambulatory Visit
Admission: RE | Admit: 2019-02-01 | Discharge: 2019-02-01 | Disposition: A | Discharge status: Home / Self Care | Payer: Medicare Other | Source: Ambulatory Visit | Attending: Cardiothoracic Surgery | Admitting: Cardiothoracic Surgery

## 2019-02-01 ENCOUNTER — Ambulatory Visit (INDEPENDENT_AMBULATORY_CARE_PROVIDER_SITE_OTHER): Payer: Medicare Other | Admitting: Cardiothoracic Surgery

## 2019-02-01 VITALS — BP 120/69 | HR 80 | Temp 97.9°F | Ht 68.0 in | Wt 225.0 lb

## 2019-02-01 DIAGNOSIS — Z792 Long term (current) use of antibiotics: Secondary | ICD-10-CM | POA: Diagnosis not present

## 2019-02-01 DIAGNOSIS — Z452 Encounter for adjustment and management of vascular access device: Secondary | ICD-10-CM | POA: Diagnosis not present

## 2019-02-01 DIAGNOSIS — J869 Pyothorax without fistula: Secondary | ICD-10-CM | POA: Diagnosis not present

## 2019-02-01 DIAGNOSIS — Z48813 Encounter for surgical aftercare following surgery on the respiratory system: Secondary | ICD-10-CM | POA: Diagnosis not present

## 2019-02-01 DIAGNOSIS — Z5181 Encounter for therapeutic drug level monitoring: Secondary | ICD-10-CM | POA: Diagnosis not present

## 2019-02-01 DIAGNOSIS — J852 Abscess of lung without pneumonia: Secondary | ICD-10-CM | POA: Diagnosis not present

## 2019-02-01 NOTE — Patient Instructions (Addendum)
Follow up here Friday at 8:00 am. You will need to get your chest xray done before coming to the office, go to [facility] around 7 am and enter in through the [place] entrance for this.

## 2019-02-01 NOTE — Progress Notes (Signed)
He returns today in follow-up.  He denies any fevers or chills.  He has continued on his intravenous antibiotics.  He has an appointment tomorrow with the infectious disease experts.  He has no cough.  He has no pain and is not taking any pain medications.  He has been able to eat a regular diet and has had normal bowel movements.  His abdomen is not distended.  His thoracotomy wound is well-healed.  We removed his staples and Steri-Stripped his wound.  All of his chest tube sites are clean dry and intact.  His lungs are clear with the exception of the left base where they are still somewhat diminished.  His heart is regular.  I have independently reviewed his chest x-ray.  This shows a small right pleural effusion.  There is improvement in the overall appearance of the left hemithorax.  I did place his Heimlich valve under sterile saline.  There is a small air leak.  I will see him back on Friday.  He will have a chest x-ray made just prior to the visit.

## 2019-02-02 ENCOUNTER — Ambulatory Visit: Payer: Medicare Other | Attending: Infectious Diseases | Admitting: Infectious Diseases

## 2019-02-02 ENCOUNTER — Other Ambulatory Visit: Payer: Self-pay | Admitting: Infectious Diseases

## 2019-02-02 ENCOUNTER — Encounter: Payer: Self-pay | Admitting: Infectious Diseases

## 2019-02-02 ENCOUNTER — Telehealth: Payer: Self-pay

## 2019-02-02 VITALS — BP 95/60 | HR 79 | Temp 98.1°F | Resp 16 | Ht 68.0 in | Wt 225.0 lb

## 2019-02-02 DIAGNOSIS — Z87891 Personal history of nicotine dependence: Secondary | ICD-10-CM

## 2019-02-02 DIAGNOSIS — J869 Pyothorax without fistula: Secondary | ICD-10-CM | POA: Diagnosis not present

## 2019-02-02 DIAGNOSIS — Z923 Personal history of irradiation: Secondary | ICD-10-CM

## 2019-02-02 DIAGNOSIS — D649 Anemia, unspecified: Secondary | ICD-10-CM | POA: Diagnosis not present

## 2019-02-02 DIAGNOSIS — C61 Malignant neoplasm of prostate: Secondary | ICD-10-CM

## 2019-02-02 DIAGNOSIS — Z978 Presence of other specified devices: Secondary | ICD-10-CM

## 2019-02-02 DIAGNOSIS — Z79899 Other long term (current) drug therapy: Secondary | ICD-10-CM | POA: Diagnosis not present

## 2019-02-02 NOTE — Telephone Encounter (Signed)
thanks

## 2019-02-02 NOTE — Progress Notes (Signed)
NAME: Jonathan Burns  DOB: 25-Jan-1946  MRN: [id number]  Date/Time: 02/02/2019 10:19 AM   Subjective:  Follow up visit: here with his grandson ? RENDON HOWELL is a 73 y.o. male with a history of recent hospitalization between 01/13/19 until 01/29/19 for left empyema and underwent thoracotomy and decortication and has been on IV ceftriaxone and PO flagyl until 02/11/19. HE is doing better. Still has the chest drains- Saw Dr.Oaks yesterday and had a CXR . As there is still some leak the tubes continue and he has another appt on Friday. No diarrhea No Fever No chills No pain No headache Some leg swelling  Past Medical History:  Diagnosis Date  . Anemia   . BPH (benign prostatic hypertrophy)   . Cancer ([place])   . Chronic GI bleeding   . COPD (chronic obstructive pulmonary disease) ([place])   . Hyperlipidemia   . Hypertension   . Prostate nodule   . PUD (peptic ulcer disease)   . Rising PSA level     Past Surgical History:  Procedure Laterality Date  . COLONOSCOPY  2015  . ESOPHAGOGASTRODUODENOSCOPY  2015  . FLEXIBLE BRONCHOSCOPY Left 01/20/2019   Procedure: FLEXIBLE BRONCHOSCOPY;  Surgeon: Nestor Lewandowsky, MD;  Location: [facility] ORS;  Service: Thoracic;  Laterality: Left;  . THORACOTOMY Left 01/20/2019   Procedure: CONVERTED TO THORACOTOMY MAJOR;  Surgeon: Nestor Lewandowsky, MD;  Location: [facility] ORS;  Service: Thoracic;  Laterality: Left;  Marland Kitchen VIDEO ASSISTED THORACOSCOPY (VATS)/THOROCOTOMY Left 01/20/2019   Procedure: VIDEO ASSISTED THORACOSCOPY (VATS)/THOROCOTOMY;  Surgeon: Nestor Lewandowsky, MD;  Location: [facility] ORS;  Service: Thoracic;  Laterality: Left;    Social History   Socioeconomic History  . Marital status: Married    Spouse name: Not on file  . Number of children: Not on file  . Years of education: Not on file  . Highest education level: Associate degree: academic program  Occupational History  . Not on file  Tobacco Use  . Smoking status: Former Smoker    Packs/day: 1.00    Years:  30.00    Pack years: 30.00    Types: Cigarettes    Quit date: 08/17/1986    Years since quitting: 32.4  . Smokeless tobacco: Never Used  Substance and Sexual Activity  . Alcohol use: Yes    Alcohol/week: 0.0 standard drinks    Comment: occasional  . Drug use: No  . Sexual activity: Not on file  Other Topics Concern  . 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 Cancer Mother        breast  . Diabetes Mother   . Aneurysm Father   . Kidney disease Neg Hx   . Prostate cancer Neg Hx   . Kidney cancer Neg Hx    No Known Allergies  ? REVIEW OF SYSTEMS:  As above ?  Objective:  VITALS:  BP 95/60   Pulse 79   Temp 98.1 F (36.7 C) (Oral)   Resp 16   Ht _0  (1.727 m)   Wt 225 lb (102.1 kg)   SpO2 95% Comment: 2 liter O2  BMI 34.21 kg/m  PHYSICAL EXAM:  General: Alert, cooperative, no distress, appears stated age.  Nasal cannula oxygen Head: Normocephalic, without obvious abnormality, atraumatic. Eyes: Conjunctivae clear, anicteric sclerae. Pupils are equal ENT Nares normal. No drainage or sinus tenderness. Lips, mucosa,  and tongue normal. No Thrush Neck: Supple, symmetrical, no adenopathy, thyroid: non tender no carotid bruit and no JVD. Back: No CVA tenderness. Lungs: b/l air entry- decreased left base- has 4 tubes on the left side Heart: Regular rate and rhythm, no murmur, rub or gallop. Abdomen: Soft, non-tender,not distended. Bowel sounds normal. No masses Extremities: ankle edema b/l Rt PICC site clean Skin: No rashes or lesions. Or bruising Lymph: Cervical, supraclavicular normal. Neurologic: Grossly non-focal, gait normal- a little unsteady Pertinent Labs Lab Results CBC    Component Value Date/Time   WBC 13.4 (H) 01/27/2019 1254   RBC 3.91 (L) 01/27/2019 1254   HGB 11.8 (L) 01/27/2019 1254   HGB 14.0 01/12/2019 0823   HCT 34.1  (L) 01/27/2019 1254   HCT 42.4 01/12/2019 0823   PLT 482 (H) 01/27/2019 1254   PLT 610 (H) 01/12/2019 0823   MCV 87.2 01/27/2019 1254   MCV 95 01/12/2019 0823   MCH 30.2 01/27/2019 1254   MCHC 34.6 01/27/2019 1254   RDW 15.0 01/27/2019 1254   RDW 12.8 01/12/2019 0823   LYMPHSABS 1.4 01/27/2019 1254   LYMPHSABS 1.3 01/12/2019 0823   MONOABS 1.4 (H) 01/27/2019 1254   EOSABS 0.2 01/27/2019 1254   EOSABS 0.1 01/12/2019 0823   BASOSABS 0.1 01/27/2019 1254   BASOSABS 0.1 01/12/2019 0823    CMP Latest Ref Rng & Units 01/28/2019 01/27/2019 01/27/2019  Glucose 70 - 99 mg/dL 108(H) - 109(H)  BUN 8 - 23 mg/dL 10 - 9  Creatinine 0.61 - 1.24 mg/dL 0.59(L) - 0.58(L)  Sodium 135 - 145 mmol/L 135 - 138  Potassium 3.5 - 5.1 mmol/L 3.4(L) 3.3(L) 2.8(L)  Chloride 98 - 111 mmol/L 103 - 103  CO2 22 - 32 mmol/L 25 - 26  Calcium 8.9 - 10.3 mg/dL 7.6(L) - 7.7(L)  Total Protein 6.5 - 8.1 g/dL - - -  Total Bilirubin 0.3 - 1.2 mg/dL - - -  Alkaline Phos 38 - 126 U/L - - -  AST 15 - 41 U/L - - -  ALT 0 - 44 U/L - - -       IMAGING RESULTS:  I have personally reviewed the films ?Persistent pleural thickening and/or loculated fluid on the left, slightly diminished. Impression/Recommendation ? Left empyema- S/p decortication- has 4 chest drains followed by Dr.Oaks- Currently on IV ceftriaxone + flagyl which he will continue till 02/11/19 ?Marland Kitchen After that will switch to oral medication- I would have preferred to use augmentin but unasyn gave him diarrhea. So will do cefuroxime 544m PO BID + flagyl for 2 more weeks after that- if no diarrhea for 24 hrs- PICC can be removed Labs drawn yesterday- results pending  Anemia- has been taking iron  Prostate Ca - s/p radiation/ currently on androgen deprivation therapy with monthly lupron/Eligard at the urologist office ? ___________________________________________________ Discussed with patient,and grandson Note:  This document was prepared using Dragon  voice recognition software and may include unintentional dictation errors.

## 2019-02-02 NOTE — Patient Instructions (Addendum)
You are here  for follow up of the left lung empyema- you are on IV ceftriaxone once a day until 02/11/19 and oral metronidazole 500mg  orally three times a day- You will finish IV  on 02/11/19 and you  will go on oral antibiotics . Will observe you on oral medicine to make sure it does not cause diarrhea before we pull the PICC out.

## 2019-02-02 NOTE — Telephone Encounter (Signed)
Spoke to [facility] and labs drawn yesterday will be faxed to [place] ASAP. There are no results yet but they will fax to [place] upon receiving.

## 2019-02-03 ENCOUNTER — Telehealth: Payer: Self-pay | Admitting: Family Medicine

## 2019-02-03 DIAGNOSIS — Z792 Long term (current) use of antibiotics: Secondary | ICD-10-CM | POA: Diagnosis not present

## 2019-02-03 DIAGNOSIS — Z48813 Encounter for surgical aftercare following surgery on the respiratory system: Secondary | ICD-10-CM | POA: Diagnosis not present

## 2019-02-03 DIAGNOSIS — Z5181 Encounter for therapeutic drug level monitoring: Secondary | ICD-10-CM | POA: Diagnosis not present

## 2019-02-03 DIAGNOSIS — J852 Abscess of lung without pneumonia: Secondary | ICD-10-CM | POA: Diagnosis not present

## 2019-02-03 DIAGNOSIS — J869 Pyothorax without fistula: Secondary | ICD-10-CM | POA: Diagnosis not present

## 2019-02-03 DIAGNOSIS — Z452 Encounter for adjustment and management of vascular access device: Secondary | ICD-10-CM | POA: Diagnosis not present

## 2019-02-03 NOTE — Telephone Encounter (Signed)
Gerald Stabs, PT with [facility], calling for verbal orders to continue seeing patient.    Frequency:  2w1, 1w3.    Cb#  [id number]

## 2019-02-04 DIAGNOSIS — J869 Pyothorax without fistula: Secondary | ICD-10-CM | POA: Diagnosis not present

## 2019-02-04 DIAGNOSIS — Z48813 Encounter for surgical aftercare following surgery on the respiratory system: Secondary | ICD-10-CM | POA: Diagnosis not present

## 2019-02-04 DIAGNOSIS — J852 Abscess of lung without pneumonia: Secondary | ICD-10-CM | POA: Diagnosis not present

## 2019-02-04 DIAGNOSIS — Z5181 Encounter for therapeutic drug level monitoring: Secondary | ICD-10-CM | POA: Diagnosis not present

## 2019-02-04 DIAGNOSIS — Z792 Long term (current) use of antibiotics: Secondary | ICD-10-CM | POA: Diagnosis not present

## 2019-02-04 DIAGNOSIS — Z452 Encounter for adjustment and management of vascular access device: Secondary | ICD-10-CM | POA: Diagnosis not present

## 2019-02-04 NOTE — Telephone Encounter (Signed)
OK for verbal orders?

## 2019-02-04 NOTE — Telephone Encounter (Signed)
Called and let Gerald Stabs know that Dr. Wynetta Emery gave the ok for verbal orders.

## 2019-02-05 ENCOUNTER — Other Ambulatory Visit: Payer: Self-pay

## 2019-02-05 ENCOUNTER — Ambulatory Visit
Admission: RE | Admit: 2019-02-05 | Discharge: 2019-02-05 | Disposition: A | Discharge status: Home / Self Care | Payer: Medicare Other | Source: Ambulatory Visit | Attending: Cardiothoracic Surgery | Admitting: Cardiothoracic Surgery

## 2019-02-05 ENCOUNTER — Ambulatory Visit (INDEPENDENT_AMBULATORY_CARE_PROVIDER_SITE_OTHER): Payer: Medicare Other | Admitting: Cardiothoracic Surgery

## 2019-02-05 ENCOUNTER — Encounter: Payer: Self-pay | Admitting: Cardiothoracic Surgery

## 2019-02-05 ENCOUNTER — Ambulatory Visit
Admission: RE | Admit: 2019-02-05 | Discharge: 2019-02-05 | Disposition: A | Discharge status: Home / Self Care | Payer: Medicare Other | Attending: Cardiothoracic Surgery | Admitting: Cardiothoracic Surgery

## 2019-02-05 VITALS — BP 113/70 | HR 94 | Temp 97.6°F | Resp 16 | Ht 69.0 in | Wt 218.2 lb

## 2019-02-05 DIAGNOSIS — J869 Pyothorax without fistula: Secondary | ICD-10-CM | POA: Insufficient documentation

## 2019-02-05 DIAGNOSIS — J90 Pleural effusion, not elsewhere classified: Secondary | ICD-10-CM | POA: Diagnosis not present

## 2019-02-05 NOTE — Patient Instructions (Signed)
Your Chest tubes have been pulled back a little and the drainage bag has been removed. You will have to change the dressing as needed.  Wash over the area Only with soap and water. Do not apply any creams or ointments to the area.   Remember to get your X-ray prior to coming to the appointment.   Please see your appointments below.

## 2019-02-05 NOTE — Progress Notes (Signed)
He returns today in follow-up.  He has had no new complaints.  He did have a chest x-ray made today.  I have independently reviewed that.  There is no evidence of pneumothorax.  There is no pleural effusion.  The right-sided pleural effusion is improved.  He did see the infectious disease specialist and they recommended 1 more week of intravenous antibiotics followed by oral antibiotics.  He is no fevers or pain.  I transected his chest tubes.  We repeated his chest x-ray and there is no evidence of pneumothorax with the tubes open to atmospheric pressure.  I therefore converted them to empyema tubes since withdrew each tube about 3 to 4 cm.  The most anterior tube the patient did have a little discomfort with.  Otherwise he tolerated the procedure very well.  We will see him back again early next week for tube advancement.  He will obtain a chest x-ray at that time.

## 2019-02-08 ENCOUNTER — Other Ambulatory Visit: Payer: Self-pay

## 2019-02-08 ENCOUNTER — Ambulatory Visit
Admission: RE | Admit: 2019-02-08 | Discharge: 2019-02-08 | Disposition: A | Discharge status: Home / Self Care | Payer: Medicare Other | Source: Ambulatory Visit | Attending: Cardiothoracic Surgery | Admitting: Cardiothoracic Surgery

## 2019-02-08 ENCOUNTER — Encounter: Payer: Self-pay | Admitting: Physician Assistant

## 2019-02-08 ENCOUNTER — Other Ambulatory Visit: Payer: Self-pay | Admitting: Physician Assistant

## 2019-02-08 ENCOUNTER — Encounter: Payer: Self-pay | Admitting: Family Medicine

## 2019-02-08 ENCOUNTER — Telehealth: Payer: Self-pay

## 2019-02-08 ENCOUNTER — Other Ambulatory Visit: Payer: Self-pay | Admitting: Emergency Medicine

## 2019-02-08 ENCOUNTER — Ambulatory Visit (INDEPENDENT_AMBULATORY_CARE_PROVIDER_SITE_OTHER): Payer: Medicare Other | Admitting: Physician Assistant

## 2019-02-08 VITALS — BP 113/72 | HR 87 | Temp 97.7°F | Resp 14 | Ht 68.0 in | Wt 208.4 lb

## 2019-02-08 DIAGNOSIS — Z452 Encounter for adjustment and management of vascular access device: Secondary | ICD-10-CM | POA: Diagnosis not present

## 2019-02-08 DIAGNOSIS — J869 Pyothorax without fistula: Secondary | ICD-10-CM

## 2019-02-08 DIAGNOSIS — Z5181 Encounter for therapeutic drug level monitoring: Secondary | ICD-10-CM | POA: Diagnosis not present

## 2019-02-08 DIAGNOSIS — Z48813 Encounter for surgical aftercare following surgery on the respiratory system: Secondary | ICD-10-CM | POA: Diagnosis not present

## 2019-02-08 DIAGNOSIS — Z09 Encounter for follow-up examination after completed treatment for conditions other than malignant neoplasm: Secondary | ICD-10-CM

## 2019-02-08 DIAGNOSIS — J852 Abscess of lung without pneumonia: Secondary | ICD-10-CM | POA: Diagnosis not present

## 2019-02-08 DIAGNOSIS — Z792 Long term (current) use of antibiotics: Secondary | ICD-10-CM | POA: Diagnosis not present

## 2019-02-08 NOTE — Progress Notes (Signed)
[facility] POST-OP OFFICE VISIT  02/08/2019  HPI: MILDRED BOLLARD is a 73 y.o. male 19 days s/p left thoracotomy and decortication for left chest empyema with Dr Genevive Bi.  Overall doing well. Minimal issues with pain. No fever or chills. CXR remains without pneumothorax. No other complaints.   Vital signs: BP 113/72   Pulse 87   Temp 97.7 F (36.5 C) (Temporal)   Resp 14   Ht 5\' 8"  (1.727 m)   Wt 208 lb 6.4 oz (94.5 kg)   SpO2 94%   BMI 31.69 kg/m    Physical Exam: Constitutional: Well appearing male, NAD Chest: To the left chest wall there is a well healed thoracotomy incision, no erythema or drainage. 4 chest tubes present which have been converted to empyema tubes. No surrounding worsening erythema.   Assessment/Plan: This is a 73 y.o. male 19 days s/p left thoracotomy and decortication for left chest empyema   - Chest tubes were backed out about 4 cm today and re-secured; continue dry dressing  - He will finish IV Abx this week; plan for Cefuroxime and Flagyl PO x2 weeks per ID starting 12/24  - pain control prn  - he will follow up on 12/29 with Dr Genevive Bi; he will need a CXR prior to this appointment.   -- Edison Simon, PA-C [facility] 02/08/2019, 3:30 PM [phone] M-F: 7am - 4pm

## 2019-02-08 NOTE — Telephone Encounter (Signed)
Patient said Jonathan Burns is last day of IV abx? HH called requesting orders to D/C IV and labs and also want his oral Abx sent so can fill before holiday. Pls call in meds if this is true and we can fax  tomorrow signed discharge of labs and IV if needed. I will call [facility] and give verbal plan in mean time.

## 2019-02-08 NOTE — Patient Instructions (Signed)
We pulled back the tubes. Continue to clean with soap and water.   Please see your appointment below. Remember to get your Xray prior to appointment.

## 2019-02-09 ENCOUNTER — Other Ambulatory Visit: Payer: Self-pay | Admitting: Family Medicine

## 2019-02-09 ENCOUNTER — Ambulatory Visit: Payer: Self-pay | Admitting: Family Medicine

## 2019-02-09 ENCOUNTER — Other Ambulatory Visit: Payer: Self-pay | Admitting: Infectious Diseases

## 2019-02-09 ENCOUNTER — Telehealth: Payer: Self-pay

## 2019-02-09 DIAGNOSIS — J869 Pyothorax without fistula: Secondary | ICD-10-CM | POA: Diagnosis not present

## 2019-02-09 DIAGNOSIS — Z5181 Encounter for therapeutic drug level monitoring: Secondary | ICD-10-CM | POA: Diagnosis not present

## 2019-02-09 DIAGNOSIS — Z48813 Encounter for surgical aftercare following surgery on the respiratory system: Secondary | ICD-10-CM | POA: Diagnosis not present

## 2019-02-09 DIAGNOSIS — Z452 Encounter for adjustment and management of vascular access device: Secondary | ICD-10-CM | POA: Diagnosis not present

## 2019-02-09 DIAGNOSIS — J852 Abscess of lung without pneumonia: Secondary | ICD-10-CM | POA: Diagnosis not present

## 2019-02-09 DIAGNOSIS — Z792 Long term (current) use of antibiotics: Secondary | ICD-10-CM | POA: Diagnosis not present

## 2019-02-09 MED ORDER — METRONIDAZOLE 500 MG PO TABS: 500.0000 mg | ORAL_TABLET | Freq: Two times a day (BID) | ORAL | 0 refills | Status: DC

## 2019-02-09 MED ORDER — CEFUROXIME AXETIL 500 MG PO TABS: 500.0000 mg | ORAL_TABLET | Freq: Two times a day (BID) | ORAL | 0 refills | Status: DC

## 2019-02-09 NOTE — Telephone Encounter (Signed)
Requested medication (s) are due for refill today: yes  Requested medication (s) are on the active medication list: yes  Last refill:  01/06/2019  Future visit scheduled: yes  Notes to clinic:  Refill not delegated per protocol    Requested Prescriptions  Pending Prescriptions Disp Refills   baclofen (LIORESAL) 10 MG tablet [Pharmacy Med Name: BACLOFEN 10 MG TABLET] 30 tablet 0    Sig: Take 1 tablet (10 mg total) by mouth at bedtime.      Not Delegated - Analgesics:  Muscle Relaxants Failed - 02/09/2019  4:13 PM      Failed - This refill cannot be delegated      Passed - Valid encounter within last 6 months    Recent Outpatient Visits           1 month ago Fatigue, unspecified type   [place], Megan P, DO   5 months ago Annual physical exam   [place] Marnee Guarneri T, NP   6 months ago Trigger finger, right middle finger   [place] Crissman, Jeannette How, MD   1 year ago Essential hypertension   [place], Jeannette How, MD   1 year ago Essential hypertension   [place], Jeannette How, MD       Future Appointments             In 2 weeks Johnson, Barb Merino, DO [place], [place]   In 4 weeks McGow[place], Gordan [facility]   In 3 months  [employer], [place]

## 2019-02-09 NOTE — Telephone Encounter (Signed)
Verbal orders given to  [facility] PICC line on 02/11/19 oral Abx sent in to pharmacy by Dr. Delaine Lame.

## 2019-02-10 NOTE — Telephone Encounter (Signed)
Yes prescriptions were sent and I spoke to his nurse as well

## 2019-02-15 DIAGNOSIS — Z792 Long term (current) use of antibiotics: Secondary | ICD-10-CM | POA: Diagnosis not present

## 2019-02-15 DIAGNOSIS — Z5181 Encounter for therapeutic drug level monitoring: Secondary | ICD-10-CM | POA: Diagnosis not present

## 2019-02-15 DIAGNOSIS — Z48813 Encounter for surgical aftercare following surgery on the respiratory system: Secondary | ICD-10-CM | POA: Diagnosis not present

## 2019-02-15 DIAGNOSIS — Z452 Encounter for adjustment and management of vascular access device: Secondary | ICD-10-CM | POA: Diagnosis not present

## 2019-02-15 DIAGNOSIS — J852 Abscess of lung without pneumonia: Secondary | ICD-10-CM | POA: Diagnosis not present

## 2019-02-15 DIAGNOSIS — J869 Pyothorax without fistula: Secondary | ICD-10-CM | POA: Diagnosis not present

## 2019-02-16 ENCOUNTER — Other Ambulatory Visit: Payer: Self-pay

## 2019-02-16 ENCOUNTER — Ambulatory Visit (INDEPENDENT_AMBULATORY_CARE_PROVIDER_SITE_OTHER): Payer: Self-pay | Admitting: Cardiothoracic Surgery

## 2019-02-16 ENCOUNTER — Encounter: Payer: Self-pay | Admitting: Cardiothoracic Surgery

## 2019-02-16 ENCOUNTER — Ambulatory Visit
Admission: RE | Admit: 2019-02-16 | Discharge: 2019-02-16 | Disposition: A | Discharge status: Home / Self Care | Payer: Medicare Other | Source: Ambulatory Visit | Attending: Physician Assistant | Admitting: Physician Assistant

## 2019-02-16 ENCOUNTER — Ambulatory Visit
Admission: RE | Admit: 2019-02-16 | Discharge: 2019-02-16 | Disposition: A | Discharge status: Home / Self Care | Payer: Medicare Other | Attending: Cardiothoracic Surgery | Admitting: Cardiothoracic Surgery

## 2019-02-16 VITALS — BP 143/76 | HR 123 | Temp 97.3°F | Ht 68.0 in | Wt 198.0 lb

## 2019-02-16 DIAGNOSIS — J869 Pyothorax without fistula: Secondary | ICD-10-CM

## 2019-02-16 NOTE — Patient Instructions (Addendum)
We pulled back the tubes. Continue to clean with soap and water and change dressing daily.  Patient may resume back driving after today's visit. Patient may discontinue the taking the Tramadol prescription.

## 2019-02-16 NOTE — Progress Notes (Signed)
  He returns today in follow-up.  He had his tubes advanced last week.  He tolerated that well.  He also had his PICC line removed and is now on oral antibiotics.  He has no new complaints.  His only complaint is that he did have some discomfort after the tubes were advanced last week.  I have reviewed his chest x-ray from today.  It looks good overall.  There is no pleural effusion or pneumothorax on the left  We advance his chest tubes today and resecured them.  His wounds are all clean dry and intact.  I also told him that he could begin driving.  Also he will monitor his appetite and contact the infectious disease physicians if he has any further issues with diarrhea or constipation.  I will see him again in 1 week

## 2019-02-18 LAB — FUNGUS CULTURE WITH STAIN

## 2019-02-18 LAB — FUNGAL ORGANISM REFLEX

## 2019-02-18 LAB — FUNGUS CULTURE RESULT

## 2019-02-22 ENCOUNTER — Ambulatory Visit: Payer: Self-pay | Admitting: Family Medicine

## 2019-02-22 ENCOUNTER — Ambulatory Visit (INDEPENDENT_AMBULATORY_CARE_PROVIDER_SITE_OTHER): Payer: Self-pay | Admitting: Cardiothoracic Surgery

## 2019-02-22 ENCOUNTER — Other Ambulatory Visit: Payer: Self-pay

## 2019-02-22 VITALS — BP 116/71 | HR 71 | Temp 97.9°F | Resp 14 | Ht 68.0 in | Wt 208.0 lb

## 2019-02-22 DIAGNOSIS — Z5181 Encounter for therapeutic drug level monitoring: Secondary | ICD-10-CM | POA: Diagnosis not present

## 2019-02-22 DIAGNOSIS — J869 Pyothorax without fistula: Secondary | ICD-10-CM | POA: Diagnosis not present

## 2019-02-22 DIAGNOSIS — J852 Abscess of lung without pneumonia: Secondary | ICD-10-CM | POA: Diagnosis not present

## 2019-02-22 DIAGNOSIS — Z792 Long term (current) use of antibiotics: Secondary | ICD-10-CM | POA: Diagnosis not present

## 2019-02-22 DIAGNOSIS — Z452 Encounter for adjustment and management of vascular access device: Secondary | ICD-10-CM | POA: Diagnosis not present

## 2019-02-22 DIAGNOSIS — Z48813 Encounter for surgical aftercare following surgery on the respiratory system: Secondary | ICD-10-CM | POA: Diagnosis not present

## 2019-02-22 NOTE — Progress Notes (Signed)
  He comes in today without a specific complaints.  He has had no fevers or chills.  His thoracotomy wound is well-healed.  His chest tubes are all advanced about 5 cm.  They were resecured.  He will come back to see [place] later this week.  We will check a chest x-ray at that time.

## 2019-02-22 NOTE — Patient Instructions (Addendum)
Please see your follow up appointment below. Please get your Xray prior to appointment.   Please call us if you have any questions or concerns.

## 2019-02-24 DIAGNOSIS — Z5181 Encounter for therapeutic drug level monitoring: Secondary | ICD-10-CM | POA: Diagnosis not present

## 2019-02-24 DIAGNOSIS — J852 Abscess of lung without pneumonia: Secondary | ICD-10-CM | POA: Diagnosis not present

## 2019-02-24 DIAGNOSIS — Z48813 Encounter for surgical aftercare following surgery on the respiratory system: Secondary | ICD-10-CM | POA: Diagnosis not present

## 2019-02-24 DIAGNOSIS — J869 Pyothorax without fistula: Secondary | ICD-10-CM | POA: Diagnosis not present

## 2019-02-24 DIAGNOSIS — Z792 Long term (current) use of antibiotics: Secondary | ICD-10-CM | POA: Diagnosis not present

## 2019-02-24 DIAGNOSIS — Z452 Encounter for adjustment and management of vascular access device: Secondary | ICD-10-CM | POA: Diagnosis not present

## 2019-02-26 ENCOUNTER — Encounter: Payer: Self-pay | Admitting: Cardiothoracic Surgery

## 2019-02-26 ENCOUNTER — Ambulatory Visit: Payer: Self-pay | Admitting: Family Medicine

## 2019-02-26 ENCOUNTER — Ambulatory Visit
Admission: RE | Admit: 2019-02-26 | Discharge: 2019-02-26 | Disposition: A | Discharge status: Home / Self Care | Payer: Medicare Other | Attending: Cardiothoracic Surgery | Admitting: Cardiothoracic Surgery

## 2019-02-26 ENCOUNTER — Ambulatory Visit (INDEPENDENT_AMBULATORY_CARE_PROVIDER_SITE_OTHER): Payer: Self-pay | Admitting: Cardiothoracic Surgery

## 2019-02-26 ENCOUNTER — Other Ambulatory Visit: Payer: Self-pay

## 2019-02-26 ENCOUNTER — Ambulatory Visit
Admission: RE | Admit: 2019-02-26 | Discharge: 2019-02-26 | Disposition: A | Discharge status: Home / Self Care | Payer: Medicare Other | Source: Ambulatory Visit | Attending: Cardiothoracic Surgery | Admitting: Cardiothoracic Surgery

## 2019-02-26 VITALS — BP 148/83 | HR 77 | Temp 97.3°F | Resp 14 | Ht 68.0 in | Wt 194.2 lb

## 2019-02-26 DIAGNOSIS — J869 Pyothorax without fistula: Secondary | ICD-10-CM

## 2019-02-26 DIAGNOSIS — J90 Pleural effusion, not elsewhere classified: Secondary | ICD-10-CM | POA: Diagnosis not present

## 2019-02-26 NOTE — Patient Instructions (Signed)
We removed one of the chest tubes. You will need to pack the wound daily.   Please continue to wash around the area daily and cover with a dry dressing.

## 2019-02-26 NOTE — Progress Notes (Signed)
He returns today in follow-up.  He denies any fevers or chills.  He actually states he has been getting along very well.  He has no pain.  His chest x-ray today shows no pneumothorax or pleural effusion.  His wounds are clean dry and intact.  There is no erythema.  We advanced all 4 of his chest tubes.  The most anterior tube was withdrawn today and the wound was packed with iodoform gauze.  The other 3 tubes were advanced about 5 or 6 cm.  He will come back next week for additional follow-up.

## 2019-02-28 DIAGNOSIS — K279 Peptic ulcer, site unspecified, unspecified as acute or chronic, without hemorrhage or perforation: Secondary | ICD-10-CM | POA: Diagnosis not present

## 2019-02-28 DIAGNOSIS — D649 Anemia, unspecified: Secondary | ICD-10-CM | POA: Diagnosis not present

## 2019-02-28 DIAGNOSIS — Z8546 Personal history of malignant neoplasm of prostate: Secondary | ICD-10-CM | POA: Diagnosis not present

## 2019-02-28 DIAGNOSIS — Z48813 Encounter for surgical aftercare following surgery on the respiratory system: Secondary | ICD-10-CM | POA: Diagnosis not present

## 2019-02-28 DIAGNOSIS — Z8719 Personal history of other diseases of the digestive system: Secondary | ICD-10-CM | POA: Diagnosis not present

## 2019-02-28 DIAGNOSIS — Z452 Encounter for adjustment and management of vascular access device: Secondary | ICD-10-CM | POA: Diagnosis not present

## 2019-02-28 DIAGNOSIS — N400 Enlarged prostate without lower urinary tract symptoms: Secondary | ICD-10-CM | POA: Diagnosis not present

## 2019-02-28 DIAGNOSIS — Z79891 Long term (current) use of opiate analgesic: Secondary | ICD-10-CM | POA: Diagnosis not present

## 2019-02-28 DIAGNOSIS — Z923 Personal history of irradiation: Secondary | ICD-10-CM | POA: Diagnosis not present

## 2019-02-28 DIAGNOSIS — E876 Hypokalemia: Secondary | ICD-10-CM | POA: Diagnosis not present

## 2019-02-28 DIAGNOSIS — K219 Gastro-esophageal reflux disease without esophagitis: Secondary | ICD-10-CM | POA: Diagnosis not present

## 2019-02-28 DIAGNOSIS — Z87891 Personal history of nicotine dependence: Secondary | ICD-10-CM | POA: Diagnosis not present

## 2019-02-28 DIAGNOSIS — E785 Hyperlipidemia, unspecified: Secondary | ICD-10-CM | POA: Diagnosis not present

## 2019-02-28 DIAGNOSIS — Z5181 Encounter for therapeutic drug level monitoring: Secondary | ICD-10-CM | POA: Diagnosis not present

## 2019-02-28 DIAGNOSIS — J869 Pyothorax without fistula: Secondary | ICD-10-CM | POA: Diagnosis not present

## 2019-02-28 DIAGNOSIS — J852 Abscess of lung without pneumonia: Secondary | ICD-10-CM | POA: Diagnosis not present

## 2019-02-28 DIAGNOSIS — Z4682 Encounter for fitting and adjustment of non-vascular catheter: Secondary | ICD-10-CM | POA: Diagnosis not present

## 2019-02-28 DIAGNOSIS — Z792 Long term (current) use of antibiotics: Secondary | ICD-10-CM | POA: Diagnosis not present

## 2019-02-28 DIAGNOSIS — I10 Essential (primary) hypertension: Secondary | ICD-10-CM | POA: Diagnosis not present

## 2019-02-28 DIAGNOSIS — J449 Chronic obstructive pulmonary disease, unspecified: Secondary | ICD-10-CM | POA: Diagnosis not present

## 2019-03-01 DIAGNOSIS — Z5181 Encounter for therapeutic drug level monitoring: Secondary | ICD-10-CM | POA: Diagnosis not present

## 2019-03-01 DIAGNOSIS — Z48813 Encounter for surgical aftercare following surgery on the respiratory system: Secondary | ICD-10-CM | POA: Diagnosis not present

## 2019-03-01 DIAGNOSIS — Z452 Encounter for adjustment and management of vascular access device: Secondary | ICD-10-CM | POA: Diagnosis not present

## 2019-03-01 DIAGNOSIS — J869 Pyothorax without fistula: Secondary | ICD-10-CM | POA: Diagnosis not present

## 2019-03-01 DIAGNOSIS — J852 Abscess of lung without pneumonia: Secondary | ICD-10-CM | POA: Diagnosis not present

## 2019-03-01 DIAGNOSIS — Z792 Long term (current) use of antibiotics: Secondary | ICD-10-CM | POA: Diagnosis not present

## 2019-03-02 ENCOUNTER — Other Ambulatory Visit: Payer: Self-pay

## 2019-03-02 ENCOUNTER — Ambulatory Visit (INDEPENDENT_AMBULATORY_CARE_PROVIDER_SITE_OTHER): Payer: Self-pay | Admitting: Physician Assistant

## 2019-03-02 ENCOUNTER — Encounter: Payer: Self-pay | Admitting: Physician Assistant

## 2019-03-02 VITALS — BP 120/72 | HR 78 | Temp 97.9°F | Resp 14 | Ht 68.0 in | Wt 193.6 lb

## 2019-03-02 DIAGNOSIS — Z09 Encounter for follow-up examination after completed treatment for conditions other than malignant neoplasm: Secondary | ICD-10-CM

## 2019-03-02 NOTE — Progress Notes (Signed)
[place] [facility] POST-OP OFFICE VISIT  03/02/2019  HPI: TREVAUGHN SCHEAR is a 74 y.o. male 40 days s/p Left thoracoscopy with conversion to left thoracotomy; decortication of visceral and parietal pleura; intrapleural drainage of left lower lobe abscess  Presents for withdrawal of chest tubes, no issues  Vital signs: BP 120/72   Pulse 78   Temp 97.9 F (36.6 C) (Temporal)   Resp 14   Ht 5\' 8"  (1.727 m)   Wt 193 lb 9.6 oz (87.8 kg)   SpO2 95%   BMI 29.44 kg/m    Physical Exam: Constitutional: Well appearing male, NAD Chest: Well healed left thoracotomy incision, there are 3 chest tubes present, 4th chest tube site packed well.   Assessment/Plan: This is a 74 y.o. male with empyema drains   - middle anterior tube was removed today  - the remaining 2 tubes were backed out 4-5 cm and re-securred; suspect these may be able to be removed at next visit  - previous two chest tube sites were packed; wound care instructions reviewed  - dry dressing applied  - follow up on Friday with [place], PA-C [facility] 03/02/2019, 11:00 AM [phone] M-F: 7am - 4pm

## 2019-03-02 NOTE — Patient Instructions (Addendum)
Continue to clean and pack the two wounds daily. Please see your follow up appointment listed below.  Please have the chest xray prior to your visit.

## 2019-03-04 ENCOUNTER — Telehealth: Payer: Self-pay

## 2019-03-04 ENCOUNTER — Encounter: Payer: Self-pay | Admitting: Physician Assistant

## 2019-03-04 ENCOUNTER — Ambulatory Visit (INDEPENDENT_AMBULATORY_CARE_PROVIDER_SITE_OTHER): Payer: Self-pay | Admitting: Physician Assistant

## 2019-03-04 ENCOUNTER — Other Ambulatory Visit: Payer: Self-pay

## 2019-03-04 VITALS — BP 191/75 | HR 66 | Temp 96.6°F | Resp 14 | Ht 68.0 in | Wt 195.2 lb

## 2019-03-04 DIAGNOSIS — Z09 Encounter for follow-up examination after completed treatment for conditions other than malignant neoplasm: Secondary | ICD-10-CM

## 2019-03-04 MED ORDER — AMOXICILLIN-POT CLAVULANATE 875-125 MG PO TABS: 1.0000 | ORAL_TABLET | Freq: Two times a day (BID) | ORAL | 0 refills | Status: AC

## 2019-03-04 NOTE — Patient Instructions (Addendum)
Please remove the pack while in the shower.  Wash the area with soap and water and repack the wounds after your shower. Please pick up your medication at the pharmacy and begin taking it today.   Please see your appointment listed below. Per Thedore Mins you will not need an xray.

## 2019-03-04 NOTE — Progress Notes (Signed)
[place] [facility] POST-OP OFFICE VISIT  03/04/2019  HPI: Jonathan Burns is a 74 y.o. male s/p thoracoscopy with conversion to left thoracotomy; decortication of visceral and parietal pleura; intrapleural drainage of left lower lobe abscess  Presents again for chest tube withdrawal, did notice some redness surrounding one of the remaining chest tubes.   Vital signs: BP (!) 191/75   Pulse 66   Temp (!) 96.6 F (35.9 C) (Temporal)   Resp 14   Ht 5\' 8"  (1.727 m)   Wt 195 lb 3.2 oz (88.5 kg)   SpO2 93%   BMI 29.68 kg/m    Physical Exam: Constitutional: Well appearing male, NAD Chest: Well healed left thoracotomy incision, there are 2 chest tubes present, the removed 2 chest tube sites are packed well.  Skin: There is erythema surrounding the most lateral chest tube, no purulence, no fluctuance, this is tender to light palpation  Assessment/Plan: This is a 74 y.o. male with empyema drains   - Remaining 2 chest tubes removed today  - Wounds were irrigated and packed with iodoform gauze  - Reviewed wound care instructions  - Will prescribed Augmentin x7 days to ensure coverage of possible superficial skin infection  - rtc in 1 week for re-check  -- Edison Simon, PA-C [facility] 03/04/2019, 2:04 PM [phone] M-F: 7am - 4pm

## 2019-03-05 ENCOUNTER — Encounter: Payer: Self-pay | Admitting: Cardiothoracic Surgery

## 2019-03-05 ENCOUNTER — Encounter: Payer: Medicare Other | Admitting: Physician Assistant

## 2019-03-05 LAB — ACID FAST CULTURE WITH REFLEXED SENSITIVITIES (MYCOBACTERIA): Acid Fast Culture: NEGATIVE

## 2019-03-08 LAB — ACID FAST CULTURE WITH REFLEXED SENSITIVITIES (MYCOBACTERIA): Acid Fast Culture: NEGATIVE

## 2019-03-08 NOTE — Progress Notes (Addendum)
05/06/2018 9:16 AM   Jonathan Burns Jonathan Burns 12-13-1945 [id number]  Referring provider: Guadalupe Maple, MD No address on file  Chief Complaint  Patient presents with  . Prostate Cancer    HPI: Jonathan Burns is a 74 y.o. male with prostate cancer, BPH with LUTS and an incidental finding of AMH who presents today for a 6 month follow-up.  Hospitalized for an empyema in December 2020.    Prostate cancer Patient was originally referred to [place] for a possible prostate nodule found by his primary care physician.  It had not been appreciated on subsequent exams until 04/10/2017.  He does not have a family history of prostate cancer.  On 04/15/2017, patient underwent a biopsy for an elevated PSA of 6.0, and a right-sided prostate nodule. TRUS volume 33 cc.  Prostate biopsy revealed 12 of 12 cores positive including lesions up to Gleason 4+4 involving 80% of the tissue on the right lateral mid core.  Patient completed 07/2017 IM RT radiation therapy to both his prostate and pelvic nodes for stage IIB Gleason 8 adenocarcinoma for the prostate.  He is currently on ADT therapy.  His most recent PSA was < 0.1 ng/mL on 11/03/2018.  Current PSA is pending.    BPH WITH LUTS  (prostate and/or bladder) IPSS score: 5/1   Previous score: 4/1  Previous PVR: 0 mL  Major complaint(s):  Patient denies any gross hematuria, dysuria or suprapubic/flank pain.  Patient denies any fevers, chills, nausea or vomiting.     Currently taking: Tamsulosin. IPSS    Row Name 03/09/19 0900         International Prostate Symptom Score   How often have you had the sensation of not emptying your bladder?  Not at All     How often have you had to urinate less than every two hours?  Less than 1 in 5 times     How often have you found you stopped and started again several times when you urinated?  Not at All     How often have you found it difficult to postpone urination?  Less than 1 in 5 times     How often have you had a weak  urinary stream?  Less than 1 in 5 times     How often have you had to strain to start urination?  Not at All     How many times did you typically get up at night to urinate?  2 Times     Total IPSS Score  5       Quality of Life due to urinary symptoms   If you were to spend the rest of your life with your urinary condition just the way it is now how would you feel about that?  Pleased       Score:  1-7 Mild 8-19 Moderate 20-35 Severe  History of hematuria Patient was found to have 3-10 RBC's on a routine physical exam on 03/25/2016 and advised of the risk of malignancies.  Subsequent UA's did not demonstrate AMH and patient did not want to pursue hematuria workup.  He is a former smoker, with a 1 ppd history.  Quit 30 years ago.  He is not exposed to secondhand smoke.  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  No reports of gross hematuria.  Today, his UA 03/09/2019 3-10 RBC's.   CT with contrast 12/2018 for elevated WBC count and fatigue noted The adrenal glands are unremarkable. There is no  hydronephrosis on either side. There is symmetric enhancement and excretion of contrast by both kidneys. Subcentimeter bilateral renal hypodense foci are too small to characterize. The visualized ureters appear unremarkable. The urinary bladder is collapsed. There is apparent diffuse thickening of the bladder wall which may be partly related to underdistention. Cystitis is not excluded. Correlation with urinalysis recommended.  Partially visualized loculated appearing moderate-sized left pleural effusion with findings concerning for empyema - for which he was hospitalized   PMH: Past Medical History:  Diagnosis Date  . Anemia   . BPH (benign prostatic hypertrophy)   . Cancer ([place])   . Chronic GI bleeding   . COPD (chronic obstructive pulmonary disease) ([place])   . Hyperlipidemia   . Hypertension   . Prostate nodule   . PUD (peptic ulcer disease)   . Rising PSA level     Surgical  History: Past Surgical History:  Procedure Laterality Date  . COLONOSCOPY  2015  . ESOPHAGOGASTRODUODENOSCOPY  2015  . FLEXIBLE BRONCHOSCOPY Left 01/20/2019   Procedure: FLEXIBLE BRONCHOSCOPY;  Surgeon: Nestor Lewandowsky, MD;  Location: [facility] ORS;  Service: Thoracic;  Laterality: Left;  . THORACOTOMY Left 01/20/2019   Procedure: CONVERTED TO THORACOTOMY MAJOR;  Surgeon: Nestor Lewandowsky, MD;  Location: [facility] ORS;  Service: Thoracic;  Laterality: Left;  Marland Kitchen VIDEO ASSISTED THORACOSCOPY (VATS)/THOROCOTOMY Left 01/20/2019   Procedure: VIDEO ASSISTED THORACOSCOPY (VATS)/THOROCOTOMY;  Surgeon: Nestor Lewandowsky, MD;  Location: [facility] ORS;  Service: Thoracic;  Laterality: Left;    Home Medications:  Allergies as of 03/09/2019   No Known Allergies     Medication List       Accurate as of March 09, 2019  9:16 AM. If you have any questions, ask your nurse or doctor.        amLODipine 10 MG tablet Commonly known as: NORVASC Take 1 tablet (10 mg total) by mouth daily.   amoxicillin-clavulanate 875-125 MG tablet Commonly known as: Augmentin Take 1 tablet by mouth 2 (two) times daily for 7 days.   baclofen 10 MG tablet Commonly known as: LIORESAL Take 1 tablet (10 mg total) by mouth at bedtime.   carvedilol 25 MG tablet Commonly known as: COREG Take 1 tablet (25 mg total) by mouth 2 (two) times daily with a meal.   cefUROXime 500 MG tablet Commonly known as: CEFTIN Take 1 tablet (500 mg total) by mouth 2 (two) times daily with a meal.   Iron 325 (65 Fe) MG Tabs Take 1 tablet by mouth daily.   pantoprazole 40 MG tablet Commonly known as: PROTONIX Take 1 tablet (40 mg total) by mouth daily.   saccharomyces boulardii 250 MG capsule Commonly known as: FLORASTOR Take 1 capsule (250 mg total) by mouth 2 (two) times daily.   tamsulosin 0.4 MG Caps capsule Commonly known as: FLOMAX Take 1 capsule (0.4 mg total) by mouth daily.   traMADol 50 MG tablet Commonly known as: ULTRAM Take 2 tablets (100  mg total) by mouth 4 (four) times daily.       Allergies: No Known Allergies  Family History: Family History  Problem Relation Age of Onset  . Cancer Mother        breast  . Diabetes Mother   . Aneurysm Father   . Kidney disease Neg Hx   . Prostate cancer Neg Hx   . Kidney cancer Neg Hx     Social History:  reports that he quit smoking about 32 years ago. His smoking use included cigarettes. He has a 30.00  pack-year smoking history. He has never used smokeless tobacco. He reports current alcohol use. He reports that he does not use drugs.  ROS: UROLOGY Frequent Urination?: No Hard to postpone urination?: No Burning/pain with urination?: No Get up at night to urinate?: No Leakage of urine?: No Urine stream starts and stops?: No Trouble starting stream?: No Do you have to strain to urinate?: No Blood in urine?: No Urinary tract infection?: No Sexually transmitted disease?: No Injury to kidneys or bladder?: No Painful intercourse?: No Weak stream?: No Erection problems?: No Penile pain?: No  Gastrointestinal Nausea?: No Vomiting?: No Indigestion/heartburn?: No Diarrhea?: No Constipation?: No  Constitutional Fever: No Night sweats?: No Weight loss?: No Fatigue?: No  Skin Skin rash/lesions?: No Itching?: No  Eyes Blurred vision?: No Double vision?: No  Ears/Nose/Throat Sore throat?: No Sinus problems?: No  Hematologic/Lymphatic Swollen glands?: No Easy bruising?: No  Cardiovascular Leg swelling?: No Chest pain?: No  Respiratory Cough?: No Shortness of breath?: No  Endocrine Excessive thirst?: No  Musculoskeletal Back pain?: No Joint pain?: No  Neurological Headaches?: No Dizziness?: No  Psychologic Depression?: No Anxiety?: No  Physical Exam: BP 118/72   Pulse 82   Ht _0  (1.727 m)   Wt 190 lb 14.4 oz (86.6 kg)   BMI 29.03 kg/m   Constitutional:  Well nourished. Alert and oriented, No acute distress. HEENT: [place] AT, mask  in  place.  Trachea midline, no masses. Cardiovascular: No clubbing, cyanosis, or edema. Respiratory: Normal respiratory effort, no increased work of breathing. GI: Abdomen is soft, non tender, non distended, no abdominal masses. Liver and spleen not palpable.  No hernias appreciated.  Stool sample for occult testing is not indicated.   GU: No CVA tenderness.  No bladder fullness or masses.  Patient with circumcised phallus.  Urethral meatus is patent.  No penile discharge. No penile lesions or rashes. Scrotum without lesions, cysts, rashes and/or edema.  Testicles are located scrotally bilaterally. No masses are appreciated in the testicles. Left and right epididymis are normal. Rectal: Not indicated. Skin: No rashes, bruises or suspicious lesions. Neurologic: Grossly intact, no focal deficits, moving all 4 extremities. Psychiatric: Normal mood and affect.  Laboratory Data:  Lab Results  Component Value Date   WBC 13.4 (H) 01/27/2019   HGB 11.8 (L) 01/27/2019   HCT 34.1 (L) 01/27/2019   MCV 87.2 01/27/2019   PLT 482 (H) 01/27/2019    Lab Results  Component Value Date   CREATININE 0.59 (L) 01/28/2019   Lab Results  Component Value Date   TSH 3.110 08/25/2018      Component Value Date/Time   CHOL 191 08/25/2018 0839   CHOL 202 (H) 12/02/2017 0830   HDL 53 08/25/2018 0839   CHOLHDL 3.6 08/25/2018 0839   VLDL 32 (H) 12/02/2017 0830   LDLCALC 110 (H) 08/25/2018 0839   Lab Results  Component Value Date   AST 34 01/13/2019   Lab Results  Component Value Date   ALT 54 (H) 01/13/2019   PSA History  0.01 ng/mL on 12/15/2017  6.0 ng/mL on 04/10/2017  3.1 ng/mL on 04/05/2016  3.4 ng/mL on 03/25/2016  2.2 ng/mL on 04/07/2015  2.8 ng/mL on 02/23/2015  2.2 ng/mL on 10/10/2014  3.2 ng/mL on 12/15  2.3 ng/mL on 12/14  2.0 ng/mL on 12/13   2.3 ng/mL on 11/12             1.03 ng/mL on 11/10  <0.1 ng/mL on 11/03/2018 I have reviewed the labs.  Assessment & Plan:    1.  Prostate cancer - Status post IR MT radiation therapy to both his prostate and pelvic nodes for stage IIB Gleason 8 adenocarcinoma for the prostate - continue ADT - Eligard 45 mg given today  - PSA pending - RTC in 6 months for PSA and Eligard injection   2. BPH with LUTS IPSS score is 5/1, it is worsening Continue conservative management, avoiding bladder irritants and timed voiding's Continue tamsulosin 0.4 mg daily - refills given RTC in 6 months for IPSS, PSA and exam - if PSA remains undetectable   3. Microscopic hematuria (high risk) - No reports of gross hematuria  - Patient would like to recheck his UA in a few weeks, but I encouraged him to undergo a cystoscopy at this time to be evaluated for a possible bladder cancer and he understands the concern and wishes to proceed.  I have explained to the patient that they will  be scheduled for a cystoscopy in our office to evaluate their bladder.  The cystoscopy consists of passing a tube with a lens up through their urethra and into their urinary bladder.   We will inject the urethra with a lidocaine gel prior to introducing the cystoscope to help with any discomfort during the procedure.   After the procedure, they might experience blood in the urine and discomfort with urination.  This will abate after the first few voids.  I have  encouraged the patient to increase water intake  during this time.  Patient denies any allergies to lidocaine.      Return for cystoscopy with Dr. Bernardo Heater for microscopic hematuria .  Zara Council, PA-C  [facility] [address] [place], [place] [address] [phone]

## 2019-03-09 ENCOUNTER — Encounter: Payer: Self-pay | Admitting: Urology

## 2019-03-09 ENCOUNTER — Other Ambulatory Visit: Payer: Self-pay

## 2019-03-09 ENCOUNTER — Ambulatory Visit (INDEPENDENT_AMBULATORY_CARE_PROVIDER_SITE_OTHER): Payer: Medicare Other | Admitting: Urology

## 2019-03-09 VITALS — BP 118/72 | HR 82 | Ht 68.0 in | Wt 190.9 lb

## 2019-03-09 DIAGNOSIS — N401 Enlarged prostate with lower urinary tract symptoms: Secondary | ICD-10-CM | POA: Diagnosis not present

## 2019-03-09 DIAGNOSIS — C61 Malignant neoplasm of prostate: Secondary | ICD-10-CM | POA: Diagnosis not present

## 2019-03-09 DIAGNOSIS — Z87448 Personal history of other diseases of urinary system: Secondary | ICD-10-CM | POA: Diagnosis not present

## 2019-03-09 DIAGNOSIS — R3129 Other microscopic hematuria: Secondary | ICD-10-CM

## 2019-03-09 DIAGNOSIS — N138 Other obstructive and reflux uropathy: Secondary | ICD-10-CM

## 2019-03-09 LAB — URINALYSIS, COMPLETE
Bilirubin, UA: NEGATIVE
Glucose, UA: NEGATIVE
Ketones, UA: NEGATIVE
Leukocytes,UA: NEGATIVE
Nitrite, UA: NEGATIVE
Specific Gravity, UA: 1.025 (ref 1.005–1.030)
Urobilinogen, Ur: 0.2 mg/dL (ref 0.2–1.0)
pH, UA: 6 (ref 5.0–7.5)

## 2019-03-09 LAB — MICROSCOPIC EXAMINATION: Bacteria, UA: NONE SEEN

## 2019-03-09 MED ORDER — LEUPROLIDE ACETATE (6 MONTH) 45 MG ~~LOC~~ KIT: 45.0000 mg | PACK | SUBCUTANEOUS | 1 refills | Status: DC

## 2019-03-09 MED ORDER — TAMSULOSIN HCL 0.4 MG PO CAPS: 0.4000 mg | ORAL_CAPSULE | Freq: Every day | ORAL | 3 refills | Status: DC

## 2019-03-09 MED ORDER — LEUPROLIDE ACETATE (6 MONTH) 45 MG ~~LOC~~ KIT
45.0000 mg | PACK | Freq: Once | SUBCUTANEOUS | Status: AC
  Administered 2019-03-09: 10:00:00 45 mg via SUBCUTANEOUS

## 2019-03-09 NOTE — Progress Notes (Signed)
Eligard SubQ Injection   Due to Prostate Cancer patient is present today for a Eligard Injection.  Medication: Eligard 6 month Dose: 45 mg  Location: right  Lot: [id number] Exp: 10/2020  Patient tolerated well, no complications were noted  Performed by: Elberta Leatherwood, CMA  Per Zara Council patient is to continue therapy for for the rest of life. Patient's next follow up was scheduled for September 15 2019. This appointment was scheduled using wheel and given to patient today along with reminder continue on Vitamin D 800-1000iu and Calium 1000-1200mg  daily while on Androgen Deprivation Therapy.

## 2019-03-09 NOTE — Addendum Note (Signed)
Addended by: Kyra Manges on: 03/09/2019 09:46 AM   Modules accepted: Orders

## 2019-03-10 ENCOUNTER — Encounter: Payer: Self-pay | Admitting: Family Medicine

## 2019-03-10 ENCOUNTER — Ambulatory Visit: Payer: Medicare Other | Admitting: Family Medicine

## 2019-03-10 ENCOUNTER — Telehealth (INDEPENDENT_AMBULATORY_CARE_PROVIDER_SITE_OTHER): Payer: Medicare Other | Admitting: Family Medicine

## 2019-03-10 DIAGNOSIS — N401 Enlarged prostate with lower urinary tract symptoms: Secondary | ICD-10-CM

## 2019-03-10 DIAGNOSIS — I10 Essential (primary) hypertension: Secondary | ICD-10-CM

## 2019-03-10 DIAGNOSIS — E785 Hyperlipidemia, unspecified: Secondary | ICD-10-CM

## 2019-03-10 DIAGNOSIS — N138 Other obstructive and reflux uropathy: Secondary | ICD-10-CM

## 2019-03-10 DIAGNOSIS — C61 Malignant neoplasm of prostate: Secondary | ICD-10-CM | POA: Diagnosis not present

## 2019-03-10 DIAGNOSIS — J869 Pyothorax without fistula: Secondary | ICD-10-CM

## 2019-03-10 LAB — PSA: Prostate Specific Ag, Serum: <0.1 ng/mL (ref 0.0–4.0)

## 2019-03-10 NOTE — Assessment & Plan Note (Signed)
Under good control on current regimen. Continue current regimen. Continue to monitor. Call with any concerns. Refills up to date.   

## 2019-03-10 NOTE — Progress Notes (Signed)
There were no vitals taken for this visit.   Subjective:    Patient ID: Jonathan Burns, male    DOB: April 05, 1945, 74 y.o.   MRN: [id number]  HPI: Jonathan Burns is a 74 y.o. male  Chief Complaint  Patient presents with  . Hypertension   Released from the hospital after a long stay for empyema including chest tube and debridement  HYPERTENSION / [place] Satisfied with current treatment? yes Duration of hypertension: chronic BP monitoring frequency: not checking BP medication side effects: no Past BP meds: carvedilol, amlodipine Duration of hyperlipidemia: chronic Cholesterol medication side effects: not on anything Cholesterol supplements: none Past cholesterol medications: none Medication compliance: excellent compliance Aspirin: no Recent stressors: yes Recurrent headaches: no Visual changes: no Palpitations: no Dyspnea: no Chest pain: no Lower extremity edema: no Dizzy/lightheaded: no  Relevant past medical, surgical, family and social history reviewed and updated as indicated. Interim medical history since our last visit reviewed. Allergies and medications reviewed and updated.  Review of Systems  Constitutional: Positive for fatigue. Negative for activity change, appetite change, chills, diaphoresis, fever and unexpected weight change.  Respiratory: Negative.   Cardiovascular: Negative.   Genitourinary: Negative.   Psychiatric/Behavioral: Negative.     Per HPI unless specifically indicated above     Objective:    There were no vitals taken for this visit.  Wt Readings from Last 3 Encounters:  03/09/19 190 lb 14.4 oz (86.6 kg)  03/04/19 195 lb 3.2 oz (88.5 kg)  03/02/19 193 lb 9.6 oz (87.8 kg)    Physical Exam Vitals and nursing note reviewed.  Constitutional:      General: He is not in acute distress.    Appearance: Normal appearance. He is not ill-appearing, toxic-appearing or diaphoretic.  HENT:     Head: Normocephalic and atraumatic.   Right Ear: External ear normal.     Left Ear: External ear normal.     Nose: Nose normal.     Mouth/Throat:     Mouth: Mucous membranes are moist.     Pharynx: Oropharynx is clear.  Eyes:     General: No scleral icterus.       Right eye: No discharge.        Left eye: No discharge.     Conjunctiva/sclera: Conjunctivae normal.     Pupils: Pupils are equal, round, and reactive to light.  Pulmonary:     Effort: Pulmonary effort is normal. No respiratory distress.     Comments: Speaking in full sentences Musculoskeletal:        General: Normal range of motion.     Cervical back: Normal range of motion.  Skin:    Coloration: Skin is not jaundiced or pale.     Findings: No bruising, erythema, lesion or rash.  Neurological:     Mental Status: He is alert and oriented to person, place, and time. Mental status is at baseline.  Psychiatric:        Mood and Affect: Mood normal.        Behavior: Behavior normal.        Thought Content: Thought content normal.        Judgment: Judgment normal.     Results for orders placed or performed in visit on 03/09/19  Microscopic Examination   URINE  Result Value Ref Range   WBC, UA 0-5 0 - 5 /hpf   RBC 3-10 (A) 0 - 2 /hpf   Epithelial Cells (non renal) 0-10 0 - 10 /hpf  Casts Present (A) None seen /lpf   Cast Type Hyaline casts N/A   Crystals Present (A) N/A   Crystal Type Calcium Oxalate N/A   Bacteria, UA None seen None seen/Few  Urinalysis, Complete  Result Value Ref Range   Specific Gravity, UA 1.025 1.005 - 1.030   pH, UA 6.0 5.0 - 7.5   Color, UA Yellow Yellow   Appearance Ur Hazy (A) Clear   Leukocytes,UA Negative Negative   Protein,UA 2+ (A) Negative/Trace   Glucose, UA Negative Negative   Ketones, UA Negative Negative   RBC, UA 1+ (A) Negative   Bilirubin, UA Negative Negative   Urobilinogen, Ur 0.2 0.2 - 1.0 mg/dL   Nitrite, UA Negative Negative   Microscopic Examination See below:   PSA  Result Value Ref Range    Prostate Specific Ag, Serum <0.1 0.0 - 4.0 ng/mL      Assessment & Plan:   Problem List Items Addressed This Visit      Cardiovascular and Mediastinum   HTN (hypertension), benign    Under good control at the urologist yesterday. Continue current regimen. Continue to monitor. Up to date on medications. Call with any concerns. Labs checked recently and normal.         Genitourinary   BPH with obstruction/lower urinary tract symptoms    Following with urology. Continue to monitor. Call with any concerns.       Prostate cancer [facility])    Following with urology. Continue to monitor. Call with any concerns.         Other   Hyperlipidemia    Under good control on current regimen. Continue current regimen. Continue to monitor. Call with any concerns. Refills up to date.        Empyema ([place])    Doing better every day. Continue to follow with ID and cardiothoracic surgery. Call with any concerns.           Follow up plan: Return in about 6 months (around 09/07/2019).   . This visit was completed via mychart due to the restrictions of the COVID-19 pandemic. All issues as above were discussed and addressed. Physical exam was done as above through visual confirmation on mychart. If it was felt that the patient should be evaluated in the office, they were directed there. The patient verbally consented to this visit. . Location of the patient: home . Location of the provider: home . Those involved with this call:  . Provider: Park Liter, DO . CMA: Tiffany Reel, CMA . Front Desk/Registration: Don Perking  . Time spent on call: 25 minutes with patient face to face via video conference. More than 50% of this time was spent in counseling and coordination of care. 40 minutes total spent in review of patient's record and preparation of their chart.

## 2019-03-10 NOTE — Assessment & Plan Note (Signed)
Doing better every day. Continue to follow with ID and cardiothoracic surgery. Call with any concerns.

## 2019-03-10 NOTE — Assessment & Plan Note (Signed)
Under good control at the urologist yesterday. Continue current regimen. Continue to monitor. Up to date on medications. Call with any concerns. Labs checked recently and normal.

## 2019-03-10 NOTE — Assessment & Plan Note (Signed)
Following with urology. Continue to monitor. Call with any concerns.

## 2019-03-12 ENCOUNTER — Other Ambulatory Visit: Payer: Self-pay

## 2019-03-12 ENCOUNTER — Ambulatory Visit (INDEPENDENT_AMBULATORY_CARE_PROVIDER_SITE_OTHER): Payer: Self-pay | Admitting: Cardiothoracic Surgery

## 2019-03-12 ENCOUNTER — Encounter: Payer: Self-pay | Admitting: Cardiothoracic Surgery

## 2019-03-12 VITALS — BP 126/80 | HR 74 | Temp 97.9°F | Resp 15 | Ht 68.0 in | Wt 193.6 lb

## 2019-03-12 DIAGNOSIS — J869 Pyothorax without fistula: Secondary | ICD-10-CM

## 2019-03-12 DIAGNOSIS — C3490 Malignant neoplasm of unspecified part of unspecified bronchus or lung: Secondary | ICD-10-CM

## 2019-03-12 NOTE — Progress Notes (Signed)
He returns today in follow-up.  They have continued packing his wounds daily.  All for the holes are now less than 0.5 cm deep.  There is no erythema.  His thoracotomy wound is healing as expected.  His lungs are clear.  I told him that he could stop his packing.  I would like to bring him back again in 2 weeks with a CT scan of the chest with contrast.  His first scan was performed when he had an extensive left-sided pleural effusion and the lung parenchyma was difficult to evaluate.  He also would like to have his oxygen picked up and removed.  He states that when he is exercising his oxygen levels will drop down to 92 or 93.  He does not use it and has not for the last 3 weeks.

## 2019-03-12 NOTE — Telephone Encounter (Signed)
error 

## 2019-03-12 NOTE — Patient Instructions (Addendum)
No need for further packing. Just keep a dry dressing over area and change daily until areas close fully.  We will call and discontinue your Oxygen.  We will schedule you for a CT of your chest and follow up here in 2 weeks.  You are scheduled for a CT chest with contrast at [facility] outpatient imaging for 03/26/19 at 8:30 am, please arrive by 8:15 am Prep: NPO 4 hours prior. Follow up here after.

## 2019-03-22 ENCOUNTER — Telehealth: Payer: Self-pay | Admitting: Cardiothoracic Surgery

## 2019-03-22 NOTE — Telephone Encounter (Signed)
Spoke with Pamala Hurry and let her know that the oxygen therapy was through [facility] and I had spoken with them and have faxed over another request for discontinuation of oxygen therapy.

## 2019-03-22 NOTE — Telephone Encounter (Signed)
Patient's spouse, Pamala Hurry, called in an attempt to see if the orders to d/c O2 were sent over to [facility] yet.  She states neither she nor the patient have received any communication from [facility] & wanted to make sure it was completed on our end before reaching out to them directly.  Please advise by calling back @ [phone].  Thank you

## 2019-03-26 ENCOUNTER — Other Ambulatory Visit: Payer: Self-pay

## 2019-03-26 ENCOUNTER — Encounter: Payer: Medicare Other | Admitting: Cardiothoracic Surgery

## 2019-03-26 ENCOUNTER — Ambulatory Visit (INDEPENDENT_AMBULATORY_CARE_PROVIDER_SITE_OTHER): Payer: Self-pay | Admitting: Cardiothoracic Surgery

## 2019-03-26 ENCOUNTER — Encounter: Payer: Self-pay | Admitting: Cardiothoracic Surgery

## 2019-03-26 ENCOUNTER — Ambulatory Visit
Admission: RE | Admit: 2019-03-26 | Discharge: 2019-03-26 | Disposition: A | Discharge status: Home / Self Care | Payer: Medicare Other | Source: Ambulatory Visit | Attending: Cardiothoracic Surgery | Admitting: Cardiothoracic Surgery

## 2019-03-26 VITALS — BP 130/74 | HR 80 | Temp 97.9°F | Resp 14 | Ht 68.0 in | Wt 203.4 lb

## 2019-03-26 DIAGNOSIS — C3490 Malignant neoplasm of unspecified part of unspecified bronchus or lung: Secondary | ICD-10-CM | POA: Diagnosis not present

## 2019-03-26 DIAGNOSIS — J869 Pyothorax without fistula: Secondary | ICD-10-CM

## 2019-03-26 LAB — POCT I-STAT CREATININE: Creatinine, Ser: 0.6 mg/dL — ABNORMAL LOW (ref 0.61–1.24)

## 2019-03-26 MED ORDER — IOHEXOL 300 MG/ML  SOLN
75.0000 mL | Freq: Once | INTRAMUSCULAR | Status: AC | PRN
  Administered 2019-03-26: 75 mL via INTRAVENOUS

## 2019-03-26 NOTE — Patient Instructions (Addendum)
We will see you in one month for a follow up with Dr. Genevive Bi. You will need to get an Xray prior to this appointment.   See your follow up appointment below.  Please call the office if you have any questions or concerns.

## 2019-03-26 NOTE — Progress Notes (Signed)
He returns today in follow-up.  He is now 2 months out from his left thoracotomy and decortication.  Overall he states that he has been doing quite well.  He is not short of breath.  He has no fevers.  He has no pain.  On physical exam his thoracotomy wounds are all well-healed.  He is lungs are clear bilaterally.  His heart is regular.  He did get a chest CT which I have examined today.  I see no obvious lung mass but there is extensive scarring in the pleural space.  I would like to see him back again in 4 weeks which would be 3 months postop.  At that point we will get a chest x-ray and if it looks okay we can discharge him from our care.

## 2019-03-29 ENCOUNTER — Ambulatory Visit (INDEPENDENT_AMBULATORY_CARE_PROVIDER_SITE_OTHER): Payer: Medicare Other | Admitting: Urology

## 2019-03-29 ENCOUNTER — Other Ambulatory Visit: Payer: Self-pay

## 2019-03-29 ENCOUNTER — Encounter: Payer: Self-pay | Admitting: Urology

## 2019-03-29 VITALS — BP 108/71 | HR 80 | Ht 68.0 in | Wt 194.0 lb

## 2019-03-29 DIAGNOSIS — R3129 Other microscopic hematuria: Secondary | ICD-10-CM | POA: Diagnosis not present

## 2019-03-29 LAB — URINALYSIS, COMPLETE
Bilirubin, UA: NEGATIVE
Glucose, UA: NEGATIVE
Ketones, UA: NEGATIVE
Leukocytes,UA: NEGATIVE
Nitrite, UA: NEGATIVE
Protein,UA: NEGATIVE
RBC, UA: NEGATIVE
Specific Gravity, UA: 1.025 (ref 1.005–1.030)
Urobilinogen, Ur: 0.2 mg/dL (ref 0.2–1.0)
pH, UA: 5.5 (ref 5.0–7.5)

## 2019-03-29 LAB — MICROSCOPIC EXAMINATION
Bacteria, UA: NONE SEEN
RBC, Urine: NONE SEEN /hpf (ref 0–2)

## 2019-03-29 NOTE — Progress Notes (Signed)
   03/29/19  CC:  Chief Complaint  Patient presents with  . Cysto    HPI: Asymptomatic microhematuria.  Refer to Shannon's note 03/09/2019.  Urinalysis today dipstick/microscopy negative  Blood pressure 108/71, pulse 80, height 5\' 8"  (1.727 m), weight 194 lb (88 kg).   Cystoscopy Procedure Note  Patient identification was confirmed, informed consent was obtained, and patient was prepped using Betadine solution.  Lidocaine jelly was administered per urethral meatus.     Pre-Procedure: - Inspection reveals a normal caliber urethral meatus.  Procedure: The flexible cystoscope was introduced without difficulty - No urethral strictures/lesions are present. - Mild lateral lobe enlargement prostate, mild hypervascularity prostate - Normal bladder neck - Bilateral ureteral orifices identified - Bladder mucosa  reveals no ulcers, tumors, or lesions - No bladder stones - No trabeculation  Retroflexion shows hypervascularity bladder neck   Post-Procedure: - Patient tolerated the procedure well  Assessment/ Plan: - No significant abnormalities on cystoscopy. -He has follow-up scheduled with [facility] July 2021   Abbie Sons, MD

## 2019-04-22 ENCOUNTER — Ambulatory Visit
Admission: RE | Admit: 2019-04-22 | Discharge: 2019-04-22 | Disposition: A | Discharge status: Home / Self Care | Payer: Medicare Other | Source: Ambulatory Visit | Attending: Cardiothoracic Surgery | Admitting: Cardiothoracic Surgery

## 2019-04-22 ENCOUNTER — Ambulatory Visit
Admission: RE | Admit: 2019-04-22 | Discharge: 2019-04-22 | Disposition: A | Discharge status: Home / Self Care | Payer: Medicare Other | Attending: Cardiothoracic Surgery | Admitting: Cardiothoracic Surgery

## 2019-04-22 ENCOUNTER — Other Ambulatory Visit: Payer: Self-pay

## 2019-04-22 DIAGNOSIS — J869 Pyothorax without fistula: Secondary | ICD-10-CM | POA: Diagnosis not present

## 2019-04-22 DIAGNOSIS — J929 Pleural plaque without asbestos: Secondary | ICD-10-CM | POA: Diagnosis not present

## 2019-04-23 ENCOUNTER — Encounter: Payer: Self-pay | Admitting: Cardiothoracic Surgery

## 2019-04-23 ENCOUNTER — Ambulatory Visit (INDEPENDENT_AMBULATORY_CARE_PROVIDER_SITE_OTHER): Payer: Medicare Other | Admitting: Cardiothoracic Surgery

## 2019-04-23 VITALS — BP 116/73 | HR 70 | Temp 97.3°F | Resp 14 | Ht 68.0 in | Wt 208.0 lb

## 2019-04-23 DIAGNOSIS — J869 Pyothorax without fistula: Secondary | ICD-10-CM

## 2019-04-23 NOTE — Progress Notes (Signed)
  Patient ID: BEACHER EVERY, male   DOB: 1945/07/24, 74 y.o.   MRN: [id number]  HISTORY: He returns today in follow-up.  He states that he has been doing very well.  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  He is not short of breath.   Vitals:   04/23/19 0857  BP: 116/73  Pulse: 70  Resp: 14  Temp: (!) 97.3 F (36.3 C)  SpO2: 98%     EXAM:    Resp: Lungs are clear bilaterally.  No respiratory distress, normal effort. Heart:  Regular without murmurs Abd:  Abdomen is soft, non distended and non tender. No masses are palpable.  There is no rebound and no guarding.  Neurological: Alert and oriented to person, place, and time. Coordination normal.  Skin: Skin is warm and dry. No rash noted. No diaphoretic. No erythema. No pallor.  Psychiatric: Normal mood and affect. Normal behavior. Judgment and thought content normal.    ASSESSMENT: He did have a chest x-ray made today.  Have independently reviewed that.  There is some stable pleural-parenchymal scarring.   PLAN:   He seems to recover nicely from his thoracotomy and decortication.  I did not make return visit form but would be happy to see him should the need arise.    Nestor Lewandowsky, MD

## 2019-04-23 NOTE — Patient Instructions (Signed)
   Follow-up with our office as needed.  Please call and ask to speak with a nurse if you develop questions or concerns.  

## 2019-05-24 ENCOUNTER — Ambulatory Visit (INDEPENDENT_AMBULATORY_CARE_PROVIDER_SITE_OTHER): Payer: Medicare Other

## 2019-05-24 ENCOUNTER — Other Ambulatory Visit: Payer: Self-pay

## 2019-05-24 VITALS — BP 136/62 | HR 67 | Temp 97.7°F | Ht 68.0 in | Wt 212.9 lb

## 2019-05-24 DIAGNOSIS — Z0000 Encounter for general adult medical examination without abnormal findings: Secondary | ICD-10-CM

## 2019-05-24 NOTE — Patient Instructions (Signed)
[name] , Thank you for taking time to come for your Medicare Wellness Visit. I appreciate your ongoing commitment to your health goals. Please review the following plan we discussed and let me know if I can assist you in the future.   Screening recommendations/referrals: Colonoscopy: completed 2015, no longer required  Recommended yearly ophthalmology/optometry visit for glaucoma screening and checkup Recommended yearly dental visit for hygiene and checkup  Vaccinations: Influenza vaccine: up to date  Pneumococcal vaccine: up to date  Tdap vaccine: up to date Shingles vaccine: shingrix eligible    Covid-19: completed series   Advanced directives: copy on file   Conditions/risks identified: none   Next appointment: Follow up in one year for your annual wellness visit   Preventive Care 76 Years and Older, Male Preventive care refers to lifestyle choices and visits with your health care provider that can promote health and wellness. What does preventive care include?  A yearly physical exam. This is also called an annual well check.  Dental exams once or twice a year.  Routine eye exams. Ask your health care provider how often you should have your eyes checked.  Personal lifestyle choices, including:  Daily care of your teeth and gums.  Regular physical activity.  Eating a healthy diet.  Avoiding tobacco and drug use.  Limiting alcohol use.  Practicing safe sex.  Taking low doses of aspirin every day.  Taking vitamin and mineral supplements as recommended by your health care provider. What happens during an annual well check? The services and screenings done by your health care provider during your annual well check will depend on your age, overall health, lifestyle risk factors, and family history of disease. Counseling  Your health care provider may ask you questions about your:  Alcohol use.  Tobacco use.  Drug use.  Emotional well-being.  Home and  relationship well-being.  Sexual activity.  Eating habits.  History of falls.  Memory and ability to understand (cognition).  Work and work Statistician. Screening  You may have the following tests or measurements:  Height, weight, and BMI.  Blood pressure.  Lipid and cholesterol levels. These may be checked every 5 years, or more frequently if you are over 76 years old.  Skin check.  Lung cancer screening. You may have this screening every year starting at age 76 if you have a 30-pack-year history of smoking and currently smoke or have quit within the past 15 years.  Fecal occult blood test (FOBT) of the stool. You may have this test every year starting at age 76.  Flexible sigmoidoscopy or colonoscopy. You may have a sigmoidoscopy every 5 years or a colonoscopy every 10 years starting at age 76.  Prostate cancer screening. Recommendations will vary depending on your family history and other risks.  Hepatitis C blood test.  Hepatitis B blood test.  Sexually transmitted disease (STD) testing.  Diabetes screening. This is done by checking your blood sugar (glucose) after you have not eaten for a while (fasting). You may have this done every 1-3 years.  Abdominal aortic aneurysm (AAA) screening. You may need this if you are a current or former smoker.  Osteoporosis. You may be screened starting at age 76 if you are at high risk. Talk with your health care provider about your test results, treatment options, and if necessary, the need for more tests. Vaccines  Your health care provider may recommend certain vaccines, such as:  Influenza vaccine. This is recommended every year.  Tetanus, diphtheria,  and acellular pertussis (Tdap, Td) vaccine. You may need a Td booster every 10 years.  Zoster vaccine. You may need this after age 76.  Pneumococcal 13-valent conjugate (PCV13) vaccine. One dose is recommended after age 76.  Pneumococcal polysaccharide (PPSV23) vaccine. One  dose is recommended after age 76. Talk to your health care provider about which screenings and vaccines you need and how often you need them. This information is not intended to replace advice given to you by your health care provider. Make sure you discuss any questions you have with your health care provider. Document Released: 03/03/2015 Document Revised: 10/25/2015 Document Reviewed: 12/06/2014 Elsevier Interactive Patient Education  2017 [place] Prevention in the Home Falls can cause injuries. They can happen to people of all ages. There are many things you can do to make your home safe and to help prevent falls. What can I do on the outside of my home?  Regularly fix the edges of walkways and driveways and fix any cracks.  Remove anything that might make you trip as you walk through a door, such as a raised step or threshold.  Trim any bushes or trees on the path to your home.  Use bright outdoor lighting.  Clear any walking paths of anything that might make someone trip, such as rocks or tools.  Regularly check to see if handrails are loose or broken. Make sure that both sides of any steps have handrails.  Any raised decks and porches should have guardrails on the edges.  Have any leaves, snow, or ice cleared regularly.  Use sand or salt on walking paths during winter.  Clean up any spills in your garage right away. This includes oil or grease spills. What can I do in the bathroom?  Use night lights.  Install grab bars by the toilet and in the tub and shower. Do not use towel bars as grab bars.  Use non-skid mats or decals in the tub or shower.  If you need to sit down in the shower, use a plastic, non-slip stool.  Keep the floor dry. Clean up any water that spills on the floor as soon as it happens.  Remove soap buildup in the tub or shower regularly.  Attach bath mats securely with double-sided non-slip rug tape.  Do not have throw rugs and other  things on the floor that can make you trip. What can I do in the bedroom?  Use night lights.  Make sure that you have a light by your bed that is easy to reach.  Do not use any sheets or blankets that are too big for your bed. They should not hang down onto the floor.  Have a firm chair that has side arms. You can use this for support while you get dressed.  Do not have throw rugs and other things on the floor that can make you trip. What can I do in the kitchen?  Clean up any spills right away.  Avoid walking on wet floors.  Keep items that you use a lot in easy-to-reach places.  If you need to reach something above you, use a strong step stool that has a grab bar.  Keep electrical cords out of the way.  Do not use floor polish or wax that makes floors slippery. If you must use wax, use non-skid floor wax.  Do not have throw rugs and other things on the floor that can make you trip. What can I do with my  stairs?  Do not leave any items on the stairs.  Make sure that there are handrails on both sides of the stairs and use them. Fix handrails that are broken or loose. Make sure that handrails are as long as the stairways.  Check any carpeting to make sure that it is firmly attached to the stairs. Fix any carpet that is loose or worn.  Avoid having throw rugs at the top or bottom of the stairs. If you do have throw rugs, attach them to the floor with carpet tape.  Make sure that you have a light switch at the top of the stairs and the bottom of the stairs. If you do not have them, ask someone to add them for you. What else can I do to help prevent falls?  Wear shoes that:  Do not have high heels.  Have rubber bottoms.  Are comfortable and fit you well.  Are closed at the toe. Do not wear sandals.  If you use a stepladder:  Make sure that it is fully opened. Do not climb a closed stepladder.  Make sure that both sides of the stepladder are locked into place.  Ask  someone to hold it for you, if possible.  Clearly mark and make sure that you can see:  Any grab bars or handrails.  First and last steps.  Where the edge of each step is.  Use tools that help you move around (mobility aids) if they are needed. These include:  Canes.  Walkers.  Scooters.  Crutches.  Turn on the lights when you go into a dark area. Replace any light bulbs as soon as they burn out.  Set up your furniture so you have a clear path. Avoid moving your furniture around.  If any of your floors are uneven, fix them.  If there are any pets around you, be aware of where they are.  Review your medicines with your doctor. Some medicines can make you feel dizzy. This can increase your chance of falling. Ask your doctor what other things that you can do to help prevent falls. This information is not intended to replace advice given to you by your health care provider. Make sure you discuss any questions you have with your health care provider. Document Released: 12/01/2008 Document Revised: 07/13/2015 Document Reviewed: 03/11/2014 Elsevier Interactive Patient Education  2017 [employer].

## 2019-05-24 NOTE — Progress Notes (Signed)
Subjective:   Jonathan Burns is a 74 y.o. male who presents for Medicare Annual/Subsequent preventive examination.  Review of Systems:   Cardiac Risk Factors include: advanced age (>72mn, >>62women);dyslipidemia;hypertension;male gender     Objective:    Vitals: BP 136/62 (BP Location: Left Arm, Patient Position: Sitting, Cuff Size: Normal)   Pulse 67   Temp 97.7 F (36.5 C) (Temporal)   Ht _0  (1.727 m)   Wt 212 lb 14.4 oz (96.6 kg)   SpO2 94%   BMI 32.37 kg/m   Body mass index is 32.37 kg/m.  Advanced Directives 05/24/2019 01/20/2019 01/18/2019 01/13/2019 09/03/2018 04/22/2018 12/22/2017  Does Patient Have a Medical Advance Directive? Yes No - No No Yes No  Type of Advance Directive Living will;Healthcare Power of [place] will -  Does patient want to make changes to medical advance directive? - - - - - - -  Copy of [place] Chart? Yes - validated most recent copy scanned in chart (See row information) - - - - Yes - validated most recent copy scanned in chart (See row information) -  Would patient like information on creating a medical advance directive? - No - Patient declined No - Patient declined - No - Patient declined - No - Patient declined    Tobacco Social History   Tobacco Use  Smoking Status Former Smoker  . Packs/day: 1.00  . Years: 30.00  . Pack years: 30.00  . Types: Cigarettes  . Quit date: 08/17/1986  . Years since quitting: 32.7  Smokeless Tobacco Never Used     Counseling given: Not Answered   Clinical Intake:  Pre-visit preparation completed: Yes  Pain : No/denies pain     Nutritional Risks: None Diabetes: No  How often do you need to have someone help you when you read instructions, pamphlets, or other written materials from your doctor or pharmacy?: 1 - Never  Interpreter Needed?: No  Information entered by :: Orlinda Slomski,LPN  Past Medical History:  Diagnosis Date  .  Anemia   . BPH (benign prostatic hypertrophy)   . Cancer ([place]   . Chronic GI bleeding   . [place] disease) [place]   . Hyperlipidemia   . Hypertension   . Prostate nodule   . PUD (peptic ulcer disease)   . Rising PSA level    Past Surgical History:  Procedure Laterality Date  . COLONOSCOPY  2015  . ESOPHAGOGASTRODUODENOSCOPY  2015  . FLEXIBLE BRONCHOSCOPY Left 01/20/2019   Procedure: FLEXIBLE BRONCHOSCOPY;  Surgeon: ONestor Lewandowsky MD;  Location: [facility] ORS;  Service: Thoracic;  Laterality: Left;  . THORACOTOMY Left 01/20/2019   Procedure: CONVERTED TO THORACOTOMY MAJOR;  Surgeon: ONestor Lewandowsky MD;  Location: [facility] ORS;  Service: Thoracic;  Laterality: Left;  .Marland KitchenVIDEO ASSISTED THORACOSCOPY (VATS)/THOROCOTOMY Left 01/20/2019   Procedure: VIDEO ASSISTED THORACOSCOPY (VATS)/THOROCOTOMY;  Surgeon: ONestor Lewandowsky MD;  Location: [facility] ORS;  Service: Thoracic;  Laterality: Left;   Family History  Problem Relation Age of Onset  . Cancer Mother        breast  . Diabetes Mother   . Aneurysm Father   . Kidney disease Neg Hx   . Prostate cancer Neg Hx   . Kidney cancer Neg Hx    Social History   Socioeconomic History  . Marital status: Married    Spouse name: Not on file  . Number of children: Not on file  .  Years of education: Not on file  . Highest education level: Associate degree: academic program  Occupational History  . Not on file  Tobacco Use  . Smoking status: Former Smoker    Packs/day: 1.00    Years: 30.00    Pack years: 30.00    Types: Cigarettes    Quit date: 08/17/1986    Years since quitting: 32.7  . Smokeless tobacco: Never Used  Substance and Sexual Activity  . Alcohol use: Yes    Alcohol/week: 2.0 standard drinks    Types: 2 Standard drinks or equivalent per week    Comment: twice a week, mixes up between drinks   . Drug use: No  . Sexual activity: Not on file  Other Topics Concern  . Not on file  Social History Narrative   Owns  Music therapist    Involved in [place] activities    Social Determinants of Health   Financial Resource Strain: Low Risk   . Difficulty of Paying Living Expenses: Not hard at all  Food Insecurity: No Food Insecurity  . Worried About Charity fundraiser in the Last Year: Never true  . Ran Out of Food in the Last Year: Never true  Transportation Needs: No Transportation Needs  . Lack of Transportation (Medical): No  . Lack of Transportation (Non-Medical): No  Physical Activity: Insufficiently Active  . Days of Exercise per Week: 5 days  . Minutes of Exercise per Session: 20 min  Stress:   . Feeling of Stress :   Social Connections: Not Isolated  . Frequency of Communication with Friends and Family: More than three times a week  . Frequency of Social Gatherings with Friends and Family: More than three times a week  . Attends Religious Services: More than 4 times per year  . Active Member of Clubs or Organizations: Yes  . Attends Archivist Meetings: More than 4 times per year  . Marital Status: Married    Outpatient Encounter Medications as of 05/24/2019  Medication Sig  . amLODipine (NORVASC) 10 MG tablet Take 1 tablet (10 mg total) by mouth daily.  . baclofen (LIORESAL) 10 MG tablet Take 1 tablet (10 mg total) by mouth at bedtime.  . carvedilol (COREG) 25 MG tablet Take 1 tablet (25 mg total) by mouth 2 (two) times daily with a meal.  . Ferrous Sulfate (IRON) 325 (65 FE) MG TABS Take 1 tablet by mouth daily.  Marland Kitchen losartan (COZAAR) 100 MG tablet Take 100 mg by mouth daily.  . pantoprazole (PROTONIX) 40 MG tablet Take 1 tablet (40 mg total) by mouth daily.  Marland Kitchen saccharomyces boulardii (FLORASTOR) 250 MG capsule Take 1 capsule (250 mg total) by mouth 2 (two) times daily.  . tamsulosin (FLOMAX) 0.4 MG CAPS capsule Take 1 capsule (0.4 mg total) by mouth daily.  . valACYclovir (VALTREX) 500 MG tablet Take 500 mg by mouth daily.   No facility-administered encounter  medications on file as of 05/24/2019.    Activities of Daily Living In your present state of health, do you have any difficulty performing the following activities: 05/24/2019 01/17/2019  Hearing? N N  Comment no hearing aids -  Vision? N N  Comment eyeglasses, Dr.Nice annually -  Difficulty concentrating or making decisions? N N  Walking or climbing stairs? N N  Dressing or bathing? N N  Doing errands, shopping? N N  Preparing Food and eating ? N -  Using the Toilet? N -  In the past six  months, have you accidently leaked urine? N -  Do you have problems with loss of bowel control? N -  Managing your Medications? N -  Managing your Finances? N -  Housekeeping or managing your Housekeeping? N -  Some recent data might be hidden    Patient Care Team: Valerie Roys, DO as PCP - General (Family Medicine) Hollice Espy, MD as Consulting Physician (Urology) Josefine Class, MD as Referring Physician (Gastroenterology) Laneta Simmers as Physician Assistant (Urology) [name], MD as Referring Physician (Radiation Oncology)   Assessment:   This is a routine wellness examination for Jonathan Burns.  Exercise Activities and Dietary recommendations Current Exercise Habits: Home exercise routine(also works part time), Type of exercise: strength training/weights, Time (Minutes): 15, Frequency (Times/Week): 5, Weekly Exercise (Minutes/Week): 75, Intensity: Mild, Exercise limited by: None identified  Goals Addressed   None     Fall Risk: Fall Risk  03/26/2019 03/04/2019 03/02/2019 02/26/2019 02/22/2019  Falls in the past year? 0 0 0 0 0  Number falls in past yr: - - 0 - -  Injury with Fall? - - 0 - -  Follow up - - - - -    [place]:  Any stairs in or around the home? Yes  If so, are there any without handrails? No   Home free of loose throw rugs in walkways, pet beds, electrical cords, etc? Yes  Adequate lighting in your home to reduce  risk of falls? Yes   ASSISTIVE DEVICES UTILIZED TO PREVENT FALLS:  Life alert? No  Use of a cane, walker or w/c? No  Grab bars in the bathroom? Yes  Shower chair or bench in shower? Yes  Elevated toilet seat or a handicapped toilet? Yes   TIMED UP AND GO:  Was the test performed? Yes .  Length of time to ambulate 10 feet: 8 sec.   GAIT:  Appearance of gait: Gait steady and fast without the use of an assistive device.  Education: Fall risk prevention has been discussed.  Intervention(s) required? No  DME/home health order needed?  No   Depression Screen PHQ 2/9 Scores 05/24/2019 08/25/2018 04/22/2018 09/17/2017  PHQ - 2 Score 0 0 0 0  PHQ- 9 Score - 0 - -    Cognitive Function     6CIT Screen 05/24/2019 04/22/2018 04/14/2017  What Year? 0 points 0 points 0 points  What month? 0 points 0 points 0 points  What time? 0 points 0 points 0 points  Count back from 20 0 points 0 points 0 points  Months in reverse 0 points 0 points 0 points  Repeat phrase 0 points 0 points 0 points  Total Score 0 0 0    Immunization History  Administered Date(s) Administered  . Influenza,inj,Quad PF,6+ Mos 11/22/2016  . Influenza-Unspecified 02/15/2014, 11/29/2014, 12/11/2015, 11/26/2017, 10/20/2018  . Moderna SARS-COVID-2 Vaccination 03/23/2019, 04/20/2019  . Pneumococcal Conjugate-13 02/15/2014  . Pneumococcal Polysaccharide-23 12/19/2005, 09/20/2015  . Td 11/20/2004  . Tdap 09/20/2015  . Zoster 08/06/2011    Qualifies for Shingles Vaccine? Yes  Zostavax completed 2013. Due for Shingrix. Education has been provided regarding the importance of this vaccine. Pt has been advised to call insurance company to determine out of pocket expense. Advised may also receive vaccine at local pharmacy or Health Dept. Verbalized acceptance and understanding.  Tdap: up to date   Flu Vaccine: up to date   Pneumococcal Vaccine: up to date   Covid-19 Vaccine:  Completed vaccines  Screening Tests Health  Maintenance  Topic Date Due  . INFLUENZA VACCINE  09/19/2019  . COLONOSCOPY  03/17/2023  . TETANUS/TDAP  09/19/2025  . Hepatitis C Screening  Completed  . PNA vac Low Risk Adult  Completed   Cancer Screenings:  Colorectal Screening: Completed 2015 Repeat every 10 years  Lung Cancer Screening: (Low Dose CT Chest recommended if Age 50-80 years, 30 pack-year currently smoking OR have quit w/in 15years.) does not qualify.     Additional Screening:  Hepatitis C Screening: does qualify; Completed 2017  Vision Screening: Recommended annual ophthalmology exams for early detection of glaucoma and other disorders of the eye. Is the patient up to date with their annual eye exam?  Yes  Who is the provider or what is the name of the office in which the pt attends annual eye exams? Dr.Nice   Dental Screening: Recommended annual dental exams for proper oral hygiene  Community Resource Referral:  CRR required this visit?  No        Plan:  I have personally reviewed and addressed the Medicare Annual Wellness questionnaire and have noted the following in the patient's chart:  A. Medical and social history B. Use of alcohol, tobacco or illicit drugs  C. Current medications and supplements D. Functional ability and status E.  Nutritional status F.  Physical activity G. Advance directives H. List of other physicians I.  Hospitalizations, surgeries, and ER visits in previous 12 months J.  [place] such as hearing and vision if needed, cognitive and depression L. Referrals and appointments   In addition, I have reviewed and discussed with patient certain preventive protocols, quality metrics, and best practice recommendations. A written personalized care plan for preventive services as well as general preventive health recommendations were provided to patient.   Signed,   Bevelyn Ngo, LPN  05/22/345 Nurse Health Advisor   Nurse Notes: none

## 2019-06-07 DIAGNOSIS — H353131 Nonexudative age-related macular degeneration, bilateral, early dry stage: Secondary | ICD-10-CM | POA: Diagnosis not present

## 2019-06-07 DIAGNOSIS — H33312 Horseshoe tear of retina without detachment, left eye: Secondary | ICD-10-CM | POA: Diagnosis not present

## 2019-06-07 DIAGNOSIS — B0050 Herpesviral ocular disease, unspecified: Secondary | ICD-10-CM | POA: Diagnosis not present

## 2019-06-07 DIAGNOSIS — H43813 Vitreous degeneration, bilateral: Secondary | ICD-10-CM | POA: Diagnosis not present

## 2019-06-16 DIAGNOSIS — M65332 Trigger finger, left middle finger: Secondary | ICD-10-CM | POA: Insufficient documentation

## 2019-06-16 DIAGNOSIS — M79642 Pain in left hand: Secondary | ICD-10-CM | POA: Diagnosis not present

## 2019-06-16 DIAGNOSIS — M65342 Trigger finger, left ring finger: Secondary | ICD-10-CM | POA: Diagnosis not present

## 2019-06-16 DIAGNOSIS — M65331 Trigger finger, right middle finger: Secondary | ICD-10-CM | POA: Diagnosis not present

## 2019-06-16 DIAGNOSIS — M79641 Pain in right hand: Secondary | ICD-10-CM | POA: Diagnosis not present

## 2019-06-16 DIAGNOSIS — M65341 Trigger finger, right ring finger: Secondary | ICD-10-CM | POA: Diagnosis not present

## 2019-07-14 DIAGNOSIS — M720 Palmar fascial fibromatosis [Dupuytren]: Secondary | ICD-10-CM | POA: Diagnosis not present

## 2019-07-14 DIAGNOSIS — M65341 Trigger finger, right ring finger: Secondary | ICD-10-CM | POA: Diagnosis not present

## 2019-07-14 DIAGNOSIS — M65331 Trigger finger, right middle finger: Secondary | ICD-10-CM | POA: Diagnosis not present

## 2019-08-08 ENCOUNTER — Other Ambulatory Visit: Payer: Self-pay | Admitting: Family Medicine

## 2019-08-08 DIAGNOSIS — K922 Gastrointestinal hemorrhage, unspecified: Secondary | ICD-10-CM

## 2019-08-08 NOTE — Telephone Encounter (Signed)
Requested Prescriptions  Pending Prescriptions Disp Refills  . pantoprazole (PROTONIX) 40 MG tablet [Pharmacy Med Name: PANTOPRAZOLE SOD DR 40 MG TAB] 90 tablet 0    Sig: Take 1 tablet (40 mg total) by mouth daily.     Gastroenterology: Proton Pump Inhibitors Passed - 08/08/2019  1:25 PM      Passed - Valid encounter within last 12 months    Recent Outpatient Visits          5 months ago HTN (hypertension), benign   [place], Megan P, DO   7 months ago Fatigue, unspecified type   [employer], Megan P, DO   11 months ago Annual physical exam   [place] Marnee Guarneri T, NP   1 year ago Trigger finger, right middle finger   [place], Jeannette How, MD   1 year ago Essential hypertension   [place], Jeannette How, MD      Future Appointments            In 1 month [place], Hunt Oris, PA-C [facility]   In 1 month [place], [place], DO [employer], [place]   In 9 months  [employer], [place]

## 2019-08-17 ENCOUNTER — Telehealth: Payer: Self-pay

## 2019-08-17 ENCOUNTER — Telehealth: Payer: Self-pay | Admitting: Urology

## 2019-08-17 NOTE — Telephone Encounter (Signed)
NO PA FOR [facility] FOR BCBS 08-17-19 [name]

## 2019-08-17 NOTE — Telephone Encounter (Signed)
Incoming fax from [place], stating that NO PA is needed for Eligard as it is on his formulary.

## 2019-09-10 ENCOUNTER — Other Ambulatory Visit: Payer: Self-pay

## 2019-09-10 ENCOUNTER — Inpatient Hospital Stay: Payer: Medicare Other | Attending: Radiation Oncology

## 2019-09-10 DIAGNOSIS — Z923 Personal history of irradiation: Secondary | ICD-10-CM | POA: Insufficient documentation

## 2019-09-10 DIAGNOSIS — C61 Malignant neoplasm of prostate: Secondary | ICD-10-CM | POA: Insufficient documentation

## 2019-09-10 LAB — PSA: Prostatic Specific Antigen: <0.01 ng/mL (ref 0.00–4.00)

## 2019-09-14 NOTE — Progress Notes (Signed)
05/06/2018 8:53 AM   [place] 1945-12-13 [id number]  Referring provider: Valerie Roys, DO [place],  [place] [id number]  Chief Complaint  Patient presents with  . Prostate Cancer    HPI: Jonathan Burns is a 74 y.o. male with prostate cancer, BPH with LUTS and hematuria who presents today for a 6 month follow up.    Prostate cancer Patient was originally referred to [place] for a possible prostate nodule found by his primary care physician.  It had not been appreciated on subsequent exams until 04/10/2017.  He does not have a family history of prostate cancer.  On 04/15/2017, patient underwent a biopsy for an elevated PSA of 6.0, and a right-sided prostate nodule. TRUS volume 33 cc.  Prostate biopsy revealed 12 of 12 cores positive including lesions up to Gleason 4+4 involving 80% of the tissue on the right lateral mid core.  Patient completed 07/2017 IM RT radiation therapy to both his prostate and pelvic nodes for stage IIB Gleason 8 adenocarcinoma for the prostate.  He is currently on ADT therapy- last injection 02/2019.  His most recent PSA was < 0.01 ng/mL on 08/2019.   He has infrequent hot flashes.  He is taking his calcium and Vitamin D.    BPH WITH LUTS  (prostate and/or bladder) IPSS score: 4/1  Previous score: 5/1  Previous PVR: 0 mL   Major complaint(s):  Patient denies any gross hematuria, dysuria or suprapubic/flank pain.  Patient denies any fevers, chills, nausea or vomiting.      Currently taking: Tamsulosin.   IPSS    Row Name 09/15/19 0800         International Prostate Symptom Score   How often have you had the sensation of not emptying your bladder? Less than 1 in 5     How often have you had to urinate less than every two hours? Less than 1 in 5 times     How often have you found you stopped and started again several times when you urinated? Not at All     How often have you found it difficult to postpone urination? Not at All     How often have you had a  weak urinary stream? Not at All     How often have you had to strain to start urination? Not at All     How many times did you typically get up at night to urinate? 2 Times     Total IPSS Score 4       Quality of Life due to urinary symptoms   If you were to spend the rest of your life with your urinary condition just the way it is now how would you feel about that? Pleased           Score:  1-7 Mild 8-19 Moderate 20-35 Severe  High risk hematuria Former smoker.  CT with contrast 12/2018  noted The adrenal glands are unremarkable. There is no hydronephrosis on either side. There is symmetric enhancement and excretion of contrast by both kidneys. Subcentimeter bilateral renal hypodense foci are too small to characterize. The visualized ureters appear unremarkable. The urinary bladder is collapsed. There is apparent diffuse thickening of the bladder wall which may be partly related to underdistention. Cystitis is not excluded. Correlation with urinalysis recommended.  Partially visualized loculated appearing moderate-sized left pleural effusion with findings concerning for empyema - for which he was hospitalized.  Cysto 03/29/2019 with Dr. Bernardo Heater  Mild lateral lobe enlargement prostate, mild hypervascularity prostate.  No reports of gross hematuria.    PMH: Past Medical History:  Diagnosis Date  . Anemia   . BPH (benign prostatic hypertrophy)   . Cancer ([place])   . Chronic GI bleeding   . COPD (chronic obstructive pulmonary disease) ([place])   . Hyperlipidemia   . Hypertension   . Prostate nodule   . PUD (peptic ulcer disease)   . Rising PSA level     Surgical History: Past Surgical History:  Procedure Laterality Date  . COLONOSCOPY  2015  . ESOPHAGOGASTRODUODENOSCOPY  2015  . FLEXIBLE BRONCHOSCOPY Left 01/20/2019   Procedure: FLEXIBLE BRONCHOSCOPY;  Surgeon: Nestor Lewandowsky, MD;  Location: [facility] ORS;  Service: Thoracic;  Laterality: Left;  . THORACOTOMY Left 01/20/2019   Procedure:  CONVERTED TO THORACOTOMY MAJOR;  Surgeon: Nestor Lewandowsky, MD;  Location: [facility] ORS;  Service: Thoracic;  Laterality: Left;  Marland Kitchen VIDEO ASSISTED THORACOSCOPY (VATS)/THOROCOTOMY Left 01/20/2019   Procedure: VIDEO ASSISTED THORACOSCOPY (VATS)/THOROCOTOMY;  Surgeon: Nestor Lewandowsky, MD;  Location: [facility] ORS;  Service: Thoracic;  Laterality: Left;    Home Medications:  Allergies as of 09/15/2019   No Known Allergies     Medication List       Accurate as of September 15, 2019  8:53 AM. If you have any questions, ask your nurse or doctor.        amLODipine 10 MG tablet Commonly known as: NORVASC Take 1 tablet (10 mg total) by mouth daily.   baclofen 10 MG tablet Commonly known as: LIORESAL Take 1 tablet (10 mg total) by mouth at bedtime.   carvedilol 25 MG tablet Commonly known as: COREG Take 1 tablet (25 mg total) by mouth 2 (two) times daily with a meal.   hydrochlorothiazide 25 MG tablet Commonly known as: HYDRODIURIL hydrochlorothiazide 25 mg tablet   Iron 325 (65 Fe) MG Tabs Take 1 tablet by mouth daily.   losartan 100 MG tablet Commonly known as: COZAAR Take 100 mg by mouth daily.   pantoprazole 40 MG tablet Commonly known as: PROTONIX Take 1 tablet (40 mg total) by mouth daily.   saccharomyces boulardii 250 MG capsule Commonly known as: FLORASTOR Take 1 capsule (250 mg total) by mouth 2 (two) times daily.   tamsulosin 0.4 MG Caps capsule Commonly known as: FLOMAX Take 1 capsule (0.4 mg total) by mouth daily.   trifluridine 1 % ophthalmic solution Commonly known as: VIROPTIC trifluridine 1 % eye drops   valACYclovir 500 MG tablet Commonly known as: VALTREX Take 500 mg by mouth daily.       Allergies: No Known Allergies  Family History: Family History  Problem Relation Age of Onset  . Cancer Mother        breast  . Diabetes Mother   . Aneurysm Father   . Kidney disease Neg Hx   . Prostate cancer Neg Hx   . Kidney cancer Neg Hx     Social History:  reports  that he quit smoking about 33 years ago. His smoking use included cigarettes. He has a 30.00 pack-year smoking history. He has never used smokeless tobacco. He reports current alcohol use of about 2.0 standard drinks of alcohol per week. He reports that he does not use drugs.  ROS: For pertinent review of systems please refer to history of present illness  Physical Exam: BP (!) 146/79 (BP Location: Left Arm, Patient Position: Sitting, Cuff Size: Normal)   Pulse 64   Ht _0  (1.778  m)   Wt (!) 212 lb (96.2 kg)   BMI 30.42 kg/m   Constitutional:  Well nourished. Alert and oriented, No acute distress. HEENT: [place] AT, mask in place.  Trachea midline Cardiovascular: No clubbing, cyanosis, or edema. Respiratory: Normal respiratory effort, no increased work of breathing. Neurologic: Grossly intact, no focal deficits, moving all 4 extremities. Psychiatric: Normal mood and affect.   Laboratory Data: Lab Results  Component Value Date   WBC 13.4 (H) 01/27/2019   HGB 11.8 (L) 01/27/2019   HCT 34.1 (L) 01/27/2019   MCV 87.2 01/27/2019   PLT 482 (H) 01/27/2019    Lab Results  Component Value Date   CREATININE 0.60 (L) 03/26/2019   Lab Results  Component Value Date   TSH 3.110 08/25/2018      Component Value Date/Time   CHOL 191 08/25/2018 0839   CHOL 202 (H) 12/02/2017 0830   HDL 53 08/25/2018 0839   CHOLHDL 3.6 08/25/2018 0839   VLDL 32 (H) 12/02/2017 0830   LDLCALC 110 (H) 08/25/2018 0839   Lab Results  Component Value Date   AST 34 01/13/2019   Lab Results  Component Value Date   ALT 54 (H) 01/13/2019   PSA History  0.01 ng/mL on 12/15/2017  6.0 ng/mL on 04/10/2017  3.1 ng/mL on 04/05/2016  3.4 ng/mL on 03/25/2016  2.2 ng/mL on 04/07/2015  2.8 ng/mL on 02/23/2015  2.2 ng/mL on 10/10/2014  3.2 ng/mL on 12/15  2.3 ng/mL on 12/14  2.0 ng/mL on 12/13   2.3 ng/mL on 11/12             1.03 ng/mL on 11/10  <0.1 ng/mL on 11/03/2018 Component     Latest Ref Rng &  Units 12/15/2017 08/27/2018 09/10/2019  Prostatic Specific Antigen     0.00 - 4.00 ng/mL 0.01 <0.01 <0.01   I have reviewed the labs.  Assessment & Plan:    1. Prostate cancer - Status post IR MT radiation therapy to both his prostate and pelvic nodes for stage IIB Gleason 8 adenocarcinoma for the prostate - continue ADT - Eligard 45 mg given today  - RTC in 6 months for PSA and Eligard injection - end date 10/2020  2. BPH with LUTS IPSS score is 4/1, it is slightly improved Continue conservative management, avoiding bladder irritants and timed voiding's Continue tamsulosin 0.4 mg daily - refills given RTC in 6 months for IPSS, PSA and exam   3. Microscopic hematuria (high risk) No reports of gross hematuria  Will check UA at his next appointment    Return in about 6 months (around 03/17/2020) for I PSS, PSA and ADT.  Zara Council, PA-C  [facility] [address] [place], [place] [address] [phone]

## 2019-09-15 ENCOUNTER — Ambulatory Visit (INDEPENDENT_AMBULATORY_CARE_PROVIDER_SITE_OTHER): Payer: Medicare Other | Admitting: Urology

## 2019-09-15 ENCOUNTER — Encounter: Payer: Self-pay | Admitting: Radiation Oncology

## 2019-09-15 ENCOUNTER — Encounter: Payer: Self-pay | Admitting: Urology

## 2019-09-15 ENCOUNTER — Other Ambulatory Visit: Payer: Self-pay | Admitting: Family Medicine

## 2019-09-15 ENCOUNTER — Other Ambulatory Visit: Payer: Self-pay

## 2019-09-15 VITALS — BP 146/79 | HR 64 | Ht 70.0 in | Wt 212.0 lb

## 2019-09-15 DIAGNOSIS — R3129 Other microscopic hematuria: Secondary | ICD-10-CM

## 2019-09-15 DIAGNOSIS — N401 Enlarged prostate with lower urinary tract symptoms: Secondary | ICD-10-CM

## 2019-09-15 DIAGNOSIS — N138 Other obstructive and reflux uropathy: Secondary | ICD-10-CM | POA: Diagnosis not present

## 2019-09-15 DIAGNOSIS — C61 Malignant neoplasm of prostate: Secondary | ICD-10-CM | POA: Diagnosis not present

## 2019-09-15 MED ORDER — LEUPROLIDE ACETATE (6 MONTH) 45 MG ~~LOC~~ KIT
45.0000 mg | PACK | Freq: Once | SUBCUTANEOUS | Status: AC
  Administered 2019-09-15: 45 mg via SUBCUTANEOUS

## 2019-09-15 NOTE — Progress Notes (Signed)
Eligard SubQ Injection   Due to Prostate Cancer patient is present today for a Eligard Injection.  Medication: Eligard 6 month Dose: 45 mg  Location: left upper outer buttocks Lot: [id number] Exp: 02/2021  Patient tolerated well, no complications were noted  Performed by: Bradly Bienenstock CMA  Per Zara Council, patient is to continue therapy for the rest of his life. Patient's next follow up was scheduled for 03/17/2020. This appointment was scheduled using wheel and given to patient today along with reminder continue on Vitamin D 800-1000iu and Calium 1000-1200mg  daily while on Androgen Deprivation Therapy.

## 2019-09-16 ENCOUNTER — Encounter: Payer: Self-pay | Admitting: Radiation Oncology

## 2019-09-16 ENCOUNTER — Ambulatory Visit
Admission: RE | Admit: 2019-09-16 | Discharge: 2019-09-16 | Disposition: A | Discharge status: Home / Self Care | Payer: Medicare Other | Source: Ambulatory Visit | Attending: Radiation Oncology | Admitting: Radiation Oncology

## 2019-09-16 VITALS — BP 133/68 | HR 64 | Temp 97.8°F | Wt 211.0 lb

## 2019-09-16 DIAGNOSIS — C61 Malignant neoplasm of prostate: Secondary | ICD-10-CM | POA: Diagnosis not present

## 2019-09-16 DIAGNOSIS — Z923 Personal history of irradiation: Secondary | ICD-10-CM | POA: Insufficient documentation

## 2019-09-16 NOTE — Progress Notes (Signed)
Radiation Oncology Follow up Note  Name: Jonathan Burns   Date:   09/16/2019 MRN:  [id number] DOB: [birth year]    This 74 y.o. male presents to the clinic today for 2-year follow-up status post IMRT radiation therapy for stage IIb Gleason 8 adenocarcinoma.Marland Kitchen  REFERRING PROVIDER: Guadalupe Maple, MD  HPI: Patient is a 74 year old male now at 2 years having completed IMRT radiation therapy to prostate and pelvic nodes for stage IIb Gleason 8 (4+4) adenocarcinoma the prostate seen today in routine follow-up he is doing well specifically denies any increased lower urinary tract symptoms diarrhea or fatigue.  Most recent PSA is 1.01..  He is currently on Eligard tolerating that well.  COMPLICATIONS OF TREATMENT: none  FOLLOW UP COMPLIANCE: keeps appointments   PHYSICAL EXAM:  BP (!) 133/68   Pulse 64   Temp 97.8 F (36.6 C) (Tympanic)   Wt (!) 211 lb (95.7 kg)   BMI 30.28 kg/m  Well-developed well-nourished patient in NAD. HEENT reveals PERLA, EOMI, discs not visualized.  Oral cavity is clear. No oral mucosal lesions are identified. Neck is clear without evidence of cervical or supraclavicular adenopathy. Lungs are clear to A&P. Cardiac examination is essentially unremarkable with regular rate and rhythm without murmur rub or thrill. Abdomen is benign with no organomegaly or masses noted. Motor sensory and DTR levels are equal and symmetric in the upper and lower extremities. Cranial nerves II through XII are grossly intact. Proprioception is intact. No peripheral adenopathy or edema is identified. No motor or sensory levels are noted. Crude visual fields are within normal range.  RADIOLOGY RESULTS: No current films to review  PLAN: Present time patient is doing well under excellent biochemical control of his stage IIb adenocarcinoma the prostate.  He is currently on Eligard tolerating that well.  I have asked to see him back in 1 year for follow-up he continues close follow-up care with urology.   Patient knows to call with any concerns.  I would like to take this opportunity to thank you for allowing me to participate in the care of your patient.[name], MD

## 2019-09-17 ENCOUNTER — Ambulatory Visit: Payer: Self-pay | Admitting: Family Medicine

## 2019-09-24 ENCOUNTER — Other Ambulatory Visit: Payer: Self-pay | Admitting: Family Medicine

## 2019-09-24 DIAGNOSIS — I10 Essential (primary) hypertension: Secondary | ICD-10-CM

## 2019-10-04 ENCOUNTER — Ambulatory Visit: Payer: Medicare Other | Admitting: Family Medicine

## 2019-10-13 DIAGNOSIS — Z23 Encounter for immunization: Secondary | ICD-10-CM | POA: Diagnosis not present

## 2019-10-15 ENCOUNTER — Other Ambulatory Visit: Payer: Self-pay

## 2019-10-15 ENCOUNTER — Encounter: Payer: Self-pay | Admitting: Family Medicine

## 2019-10-15 ENCOUNTER — Other Ambulatory Visit: Payer: Self-pay | Admitting: Family Medicine

## 2019-10-15 ENCOUNTER — Ambulatory Visit (INDEPENDENT_AMBULATORY_CARE_PROVIDER_SITE_OTHER): Payer: Medicare Other | Admitting: Family Medicine

## 2019-10-15 VITALS — BP 138/81 | HR 65 | Temp 98.4°F | Ht 68.0 in | Wt 212.0 lb

## 2019-10-15 DIAGNOSIS — C61 Malignant neoplasm of prostate: Secondary | ICD-10-CM

## 2019-10-15 DIAGNOSIS — K922 Gastrointestinal hemorrhage, unspecified: Secondary | ICD-10-CM | POA: Diagnosis not present

## 2019-10-15 DIAGNOSIS — D508 Other iron deficiency anemias: Secondary | ICD-10-CM

## 2019-10-15 DIAGNOSIS — J42 Unspecified chronic bronchitis: Secondary | ICD-10-CM

## 2019-10-15 DIAGNOSIS — E785 Hyperlipidemia, unspecified: Secondary | ICD-10-CM | POA: Diagnosis not present

## 2019-10-15 DIAGNOSIS — F419 Anxiety disorder, unspecified: Secondary | ICD-10-CM

## 2019-10-15 DIAGNOSIS — K219 Gastro-esophageal reflux disease without esophagitis: Secondary | ICD-10-CM

## 2019-10-15 DIAGNOSIS — I10 Essential (primary) hypertension: Secondary | ICD-10-CM

## 2019-10-15 LAB — URINALYSIS, ROUTINE W REFLEX MICROSCOPIC
Bilirubin, UA: NEGATIVE
Glucose, UA: NEGATIVE
Ketones, UA: NEGATIVE
Leukocytes,UA: NEGATIVE
Nitrite, UA: NEGATIVE
Protein,UA: NEGATIVE
RBC, UA: NEGATIVE
Specific Gravity, UA: 1.015 (ref 1.005–1.030)
Urobilinogen, Ur: 0.2 mg/dL (ref 0.2–1.0)
pH, UA: 7 (ref 5.0–7.5)

## 2019-10-15 LAB — MICROALBUMIN, URINE WAIVED
Creatinine, Urine Waived: 200 mg/dL (ref 10–300)
Microalb, Ur Waived: 10 mg/L (ref 0–19)
Microalb/Creat Ratio: <30 mg/g (ref <30)

## 2019-10-15 MED ORDER — IRON 325 (65 FE) MG PO TABS: 1.0000 | ORAL_TABLET | Freq: Every day | ORAL | 3 refills | Status: DC

## 2019-10-15 MED ORDER — CARVEDILOL 25 MG PO TABS: 25.0000 mg | ORAL_TABLET | Freq: Two times a day (BID) | ORAL | 1 refills | Status: DC

## 2019-10-15 MED ORDER — AMLODIPINE BESYLATE 10 MG PO TABS: 10.0000 mg | ORAL_TABLET | Freq: Every day | ORAL | 1 refills | Status: DC

## 2019-10-15 MED ORDER — LORAZEPAM 0.5 MG PO TABS: 0.5000 mg | ORAL_TABLET | Freq: Every day | ORAL | 0 refills | Status: DC | PRN

## 2019-10-15 MED ORDER — LOSARTAN POTASSIUM 100 MG PO TABS: ORAL_TABLET | ORAL | 1 refills | Status: DC

## 2019-10-15 MED ORDER — PANTOPRAZOLE SODIUM 40 MG PO TBEC: 40.0000 mg | DELAYED_RELEASE_TABLET | Freq: Every day | ORAL | 1 refills | Status: DC

## 2019-10-15 NOTE — Progress Notes (Signed)
BP 138/81 (BP Location: Left Arm, Patient Position: Sitting, Cuff Size: Normal)   Pulse 65   Temp 98.4 F (36.9 C) (Oral)   Ht 5\' 8"  (1.727 m)   Wt 212 lb (96.2 kg)   SpO2 96%   BMI 32.23 kg/m    Subjective:    Patient ID: Jonathan Burns, male    DOB: Jan 23, 1946, 74 y.o.   MRN: [id number]  HPI: Jonathan Burns is a 74 y.o. male  Chief Complaint  Patient presents with  . Hypertension  . Hyperlipidemia  . Anemia   HYPERTENSION / [place] Satisfied with current treatment? yes Duration of hypertension: chronic BP monitoring frequency: not checking BP medication side effects: no Duration of hyperlipidemia: chronic Cholesterol medication side effects: not on anything Cholesterol supplements: none Medication compliance: excellent compliance Aspirin: no Recent stressors: no Recurrent headaches: no Visual changes: no Palpitations: no Dyspnea: no Chest pain: no Lower extremity edema: no Dizzy/lightheaded: no  ANXIETY/STRESS- occasionally takes lorazepam for flights.  Duration:stable Anxious mood: no  Excessive worrying: no Irritability: no  Sweating: no Nausea: no Palpitations:no Hyperventilation: no Panic attacks: no Agoraphobia: no  Obscessions/compulsions: no Depressed mood: no Depression screen [facility] 2/9 05/24/2019 08/25/2018 04/22/2018 09/17/2017 05/27/2017  Decreased Interest 0 0 0 0 0  Down, Depressed, Hopeless 0 0 0 0 0  PHQ - 2 Score 0 0 0 0 0  Altered sleeping - 0 - - -  Tired, decreased energy - 0 - - -  Change in appetite - 0 - - -  Feeling bad or failure about yourself  - 0 - - -  Trouble concentrating - 0 - - -  Moving slowly or fidgety/restless - 0 - - -  Suicidal thoughts - 0 - - -  PHQ-9 Score - 0 - - -  Difficult doing work/chores - Not difficult at all - - -   Anhedonia: no Weight changes: no Insomnia: no   Hypersomnia: no Fatigue/loss of energy: no Feelings of worthlessness: no Feelings of guilt: no Impaired concentration/indecisiveness:  no Suicidal ideations: no  Crying spells: no Recent Stressors/Life Changes: no   Relationship problems: no   Family stress: no     Financial stress: no    Job stress: no    Recent death/loss: no  ANEMIA Anemia status: stable Etiology of anemia: Duration of anemia treatment:  Compliance with treatment: excellent compliance Iron supplementation side effects: no Severity of anemia: mild Fatigue: no Decreased exercise tolerance: no  Dyspnea on exertion: no Palpitations: no Bleeding: no Pica: no  Relevant past medical, surgical, family and social history reviewed and updated as indicated. Interim medical history since our last visit reviewed. Allergies and medications reviewed and updated.  Review of Systems  Constitutional: Negative.   Respiratory: Negative.   Cardiovascular: Negative.   Gastrointestinal: Negative.   Musculoskeletal: Negative.   Skin: Negative.   Psychiatric/Behavioral: Negative for agitation, behavioral problems, confusion, decreased concentration, dysphoric mood, hallucinations, self-injury, sleep disturbance and suicidal ideas. The patient is nervous/anxious. The patient is not hyperactive.     Per HPI unless specifically indicated above     Objective:    BP 138/81 (BP Location: Left Arm, Patient Position: Sitting, Cuff Size: Normal)   Pulse 65   Temp 98.4 F (36.9 C) (Oral)   Ht 5\' 8"  (1.727 m)   Wt 212 lb (96.2 kg)   SpO2 96%   BMI 32.23 kg/m   Wt Readings from Last 3 Encounters:  10/15/19 212 lb (96.2 kg)  09/16/19 Marland Kitchen)  211 lb (95.7 kg)  09/15/19 (!) 212 lb (96.2 kg)    Physical Exam Vitals and nursing note reviewed.  Constitutional:      General: He is not in acute distress.    Appearance: Normal appearance. He is not ill-appearing, toxic-appearing or diaphoretic.  HENT:     Head: Normocephalic and atraumatic.     Right Ear: External ear normal.     Left Ear: External ear normal.     Nose: Nose normal.     Mouth/Throat:     Mouth:  Mucous membranes are moist.     Pharynx: Oropharynx is clear.  Eyes:     General: No scleral icterus.       Right eye: No discharge.        Left eye: No discharge.     Extraocular Movements: Extraocular movements intact.     Conjunctiva/sclera: Conjunctivae normal.     Pupils: Pupils are equal, round, and reactive to light.  Cardiovascular:     Rate and Rhythm: Normal rate and regular rhythm.     Pulses: Normal pulses.     Heart sounds: Normal heart sounds. No murmur heard.  No friction rub. No gallop.   Pulmonary:     Effort: Pulmonary effort is normal. No respiratory distress.     Breath sounds: Normal breath sounds. No stridor. No wheezing, rhonchi or rales.  Chest:     Chest wall: No tenderness.  Musculoskeletal:        General: Normal range of motion.     Cervical back: Normal range of motion and neck supple.  Skin:    General: Skin is warm and dry.     Capillary Refill: Capillary refill takes less than 2 seconds.     Coloration: Skin is not jaundiced or pale.     Findings: No bruising, erythema, lesion or rash.  Neurological:     General: No focal deficit present.     Mental Status: He is alert and oriented to person, place, and time. Mental status is at baseline.  Psychiatric:        Mood and Affect: Mood normal.        Behavior: Behavior normal.        Thought Content: Thought content normal.        Judgment: Judgment normal.     Results for orders placed or performed in visit on 10/15/19  CBC with Differential/Platelet  Result Value Ref Range   WBC 4.6 3.4 - 10.8 x10E3/uL   RBC 4.44 4.14 - 5.80 x10E6/uL   Hemoglobin 14.4 13.0 - 17.7 g/dL   Hematocrit 42.1 37.5 - 51.0 %   MCV 95 79 - 97 fL   MCH 32.4 26.6 - 33.0 pg   MCHC 34.2 31 - 35 g/dL   RDW 12.8 11.6 - 15.4 %   Platelets 222 150 - 450 x10E3/uL   Neutrophils 65 Not Estab. %   Lymphs 15 Not Estab. %   Monocytes 15 Not Estab. %   Eos 5 Not Estab. %   Basos 0 Not Estab. %   Neutrophils Absolute 3.0 1 -  7 x10E3/uL   Lymphocytes Absolute 0.7 0 - 3 x10E3/uL   Monocytes Absolute 0.7 0 - 0 x10E3/uL   EOS (ABSOLUTE) 0.2 0.0 - 0.4 x10E3/uL   Basophils Absolute 0.0 0 - 0 x10E3/uL   Immature Granulocytes 0 Not Estab. %   Immature Grans (Abs) 0.0 0.0 - 0.1 x10E3/uL  Comprehensive metabolic panel  Result Value Ref Range  Glucose 99 65 - 99 mg/dL   BUN 11 8 - 27 mg/dL   Creatinine, Ser 0.92 0.76 - 1.27 mg/dL   GFR calc non Af Amer 81 >59 mL/min/1.73   GFR calc Af Amer 94 >59 mL/min/1.73   BUN/Creatinine Ratio 12 10 - 24   Sodium 140 134 - 144 mmol/L   Potassium 5.2 3.5 - 5.2 mmol/L   Chloride 101 96 - 106 mmol/L   CO2 27 20 - 29 mmol/L   Calcium 9.7 8.6 - 10.2 mg/dL   Total Protein 6.9 6.0 - 8.5 g/dL   Albumin 4.3 3.7 - 4.7 g/dL   Globulin, Total 2.6 1.5 - 4.5 g/dL   Albumin/Globulin Ratio 1.7 1.2 - 2.2   Bilirubin Total 0.4 0.0 - 1.2 mg/dL   Alkaline Phosphatase 120 48 - 121 IU/L   AST 24 0 - 40 IU/L   ALT 23 0 - 44 IU/L  Lipid Panel w/o Chol/HDL Ratio  Result Value Ref Range   Cholesterol, Total 211 (H) 100 - 199 mg/dL   Triglycerides 140 0 - 149 mg/dL   HDL 55 >39 mg/dL   VLDL Cholesterol Cal 25 5 - 40 mg/dL   LDL Chol Calc (NIH) 131 (H) 0 - 99 mg/dL  Iron and TIBC  Result Value Ref Range   Total Iron Binding Capacity 302 250 - 450 ug/dL   UIBC 230 111 - 343 ug/dL   Iron 72 38 - 169 ug/dL   Iron Saturation 24 15 - 55 %  Microalbumin, Urine Waived  Result Value Ref Range   Microalb, Ur Waived 10 0 - 19 mg/L   Creatinine, Urine Waived 200 10 - 300 mg/dL   Microalb/Creat Ratio <30 <30 mg/g  PSA  Result Value Ref Range   Prostate Specific Ag, Serum <0.1 0.0 - 4.0 ng/mL  TSH  Result Value Ref Range   TSH 2.660 0.450 - 4.500 uIU/mL  Urinalysis, Routine w reflex microscopic  Result Value Ref Range   Specific Gravity, UA 1.015 1.005 - 1.030   pH, UA 7.0 5.0 - 7.5   Color, UA Yellow Yellow   Appearance Ur Clear Clear   Leukocytes,UA Negative Negative   Protein,UA Negative  Negative/Trace   Glucose, UA Negative Negative   Ketones, UA Negative Negative   RBC, UA Negative Negative   Bilirubin, UA Negative Negative   Urobilinogen, Ur 0.2 0.2 - 1.0 mg/dL   Nitrite, UA Negative Negative  Ferritin  Result Value Ref Range   Ferritin 103 30.0 - 400.0 ng/mL      Assessment & Plan:   Problem List Items Addressed This Visit      Cardiovascular and Mediastinum   HTN (hypertension), benign - Primary    Under good control on current regimen. Continue current regimen. Continue to monitor. Call with any concerns. Refills given. Labs drawn today.       Relevant Medications   losartan (COZAAR) 100 MG tablet   carvedilol (COREG) 25 MG tablet   amLODipine (NORVASC) 10 MG tablet   Other Relevant Orders   CBC with Differential/Platelet (Completed)   Comprehensive metabolic panel (Completed)   Microalbumin, Urine Waived (Completed)   TSH (Completed)   Urinalysis, Routine w reflex microscopic (Completed)     Respiratory   COPD (chronic obstructive pulmonary disease) (HCC)    Under good control on current regimen. Continue current regimen. Continue to monitor. Call with any concerns. Refills given. Labs drawn today.       Relevant Orders  CBC with Differential/Platelet (Completed)   Comprehensive metabolic panel (Completed)     Digestive   Chronic GI bleeding    Rechecking labs today. Await results. Treat as needed.       Relevant Medications   pantoprazole (PROTONIX) 40 MG tablet   GERD (gastroesophageal reflux disease)    Under good control on current regimen. Continue current regimen. Continue to monitor. Call with any concerns. Refills given. Labs drawn today.       Relevant Medications   pantoprazole (PROTONIX) 40 MG tablet   Other Relevant Orders   CBC with Differential/Platelet (Completed)   Comprehensive metabolic panel (Completed)     Genitourinary   Prostate cancer ([place])    Continue to follow with urology. Call with any concerns.         Relevant Medications   LORazepam (ATIVAN) 0.5 MG tablet   Other Relevant Orders   CBC with Differential/Platelet (Completed)   Comprehensive metabolic panel (Completed)   PSA (Completed)   Urinalysis, Routine w reflex microscopic (Completed)     Other   Hyperlipidemia    Under good control on current regimen. Continue current regimen. Continue to monitor. Call with any concerns. Refills given. Labs drawn today.       Relevant Medications   losartan (COZAAR) 100 MG tablet   carvedilol (COREG) 25 MG tablet   amLODipine (NORVASC) 10 MG tablet   Other Relevant Orders   CBC with Differential/Platelet (Completed)   Comprehensive metabolic panel (Completed)   Lipid Panel w/o Chol/HDL Ratio (Completed)   Acute anxiety    Refills of lorazepam given today. 30 pills should last a year. Call with any concerns. Continue to monitor.       Relevant Medications   LORazepam (ATIVAN) 0.5 MG tablet   Other Relevant Orders   CBC with Differential/Platelet (Completed)   Comprehensive metabolic panel (Completed)   TSH (Completed)   Iron (Fe) deficiency anemia    Rechecking levels today. Await results. Treat as needed.       Relevant Medications   Ferrous Sulfate (IRON) 325 (65 Fe) MG TABS   Other Relevant Orders   CBC with Differential/Platelet (Completed)   Comprehensive metabolic panel (Completed)   Iron and TIBC (Completed)   Ferritin (Completed)    Other Visit Diagnoses    Essential hypertension       Relevant Medications   losartan (COZAAR) 100 MG tablet   carvedilol (COREG) 25 MG tablet   amLODipine (NORVASC) 10 MG tablet       Follow up plan: Return in about 6 months (around 04/16/2020).

## 2019-10-16 LAB — COMPREHENSIVE METABOLIC PANEL
ALT: 23 IU/L (ref 0–44)
AST: 24 IU/L (ref 0–40)
Albumin/Globulin Ratio: 1.7 (ref 1.2–2.2)
Albumin: 4.3 g/dL (ref 3.7–4.7)
Alkaline Phosphatase: 120 IU/L (ref 48–121)
BUN/Creatinine Ratio: 12 (ref 10–24)
BUN: 11 mg/dL (ref 8–27)
Bilirubin Total: 0.4 mg/dL (ref 0.0–1.2)
CO2: 27 mmol/L (ref 20–29)
Calcium: 9.7 mg/dL (ref 8.6–10.2)
Chloride: 101 mmol/L (ref 96–106)
Creatinine, Ser: 0.92 mg/dL (ref 0.76–1.27)
GFR calc Af Amer: 94 mL/min/{1.73_m2} (ref >59)
GFR calc non Af Amer: 81 mL/min/{1.73_m2} (ref >59)
Globulin, Total: 2.6 g/dL (ref 1.5–4.5)
Glucose: 99 mg/dL (ref 65–99)
Potassium: 5.2 mmol/L (ref 3.5–5.2)
Sodium: 140 mmol/L (ref 134–144)
Total Protein: 6.9 g/dL (ref 6.0–8.5)

## 2019-10-16 LAB — IRON AND TIBC
Iron Saturation: 24 % (ref 15–55)
Iron: 72 ug/dL (ref 38–169)
Total Iron Binding Capacity: 302 ug/dL (ref 250–450)
UIBC: 230 ug/dL (ref 111–343)

## 2019-10-16 LAB — CBC WITH DIFFERENTIAL/PLATELET
Basophils Absolute: 0 10*3/uL (ref 0.0–0.2)
Basos: 0 %
EOS (ABSOLUTE): 0.2 10*3/uL (ref 0.0–0.4)
Eos: 5 %
Hematocrit: 42.1 % (ref 37.5–51.0)
Hemoglobin: 14.4 g/dL (ref 13.0–17.7)
Immature Grans (Abs): 0 10*3/uL (ref 0.0–0.1)
Immature Granulocytes: 0 %
Lymphocytes Absolute: 0.7 10*3/uL (ref 0.7–3.1)
Lymphs: 15 %
MCH: 32.4 pg (ref 26.6–33.0)
MCHC: 34.2 g/dL (ref 31.5–35.7)
MCV: 95 fL (ref 79–97)
Monocytes Absolute: 0.7 10*3/uL (ref 0.1–0.9)
Monocytes: 15 %
Neutrophils Absolute: 3 10*3/uL (ref 1.4–7.0)
Neutrophils: 65 %
Platelets: 222 10*3/uL (ref 150–450)
RBC: 4.44 x10E6/uL (ref 4.14–5.80)
RDW: 12.8 % (ref 11.6–15.4)
WBC: 4.6 10*3/uL (ref 3.4–10.8)

## 2019-10-16 LAB — TSH: TSH: 2.66 u[IU]/mL (ref 0.450–4.500)

## 2019-10-16 LAB — PSA: Prostate Specific Ag, Serum: <0.1 ng/mL (ref 0.0–4.0)

## 2019-10-16 LAB — LIPID PANEL W/O CHOL/HDL RATIO
Cholesterol, Total: 211 mg/dL — ABNORMAL HIGH (ref 100–199)
HDL: 55 mg/dL (ref >39)
LDL Chol Calc (NIH): 131 mg/dL — ABNORMAL HIGH (ref 0–99)
Triglycerides: 140 mg/dL (ref 0–149)
VLDL Cholesterol Cal: 25 mg/dL (ref 5–40)

## 2019-10-16 LAB — FERRITIN: Ferritin: 103 ng/mL (ref 30–400)

## 2019-10-17 NOTE — Assessment & Plan Note (Signed)
Under good control on current regimen. Continue current regimen. Continue to monitor. Call with any concerns. Refills given. Labs drawn today.   

## 2019-10-17 NOTE — Assessment & Plan Note (Signed)
Rechecking labs today. Await results. Treat as needed.  °

## 2019-10-17 NOTE — Assessment & Plan Note (Signed)
Refills of lorazepam given today. 30 pills should last a year. Call with any concerns. Continue to monitor.

## 2019-10-17 NOTE — Assessment & Plan Note (Signed)
Rechecking levels today. Await results. Treat as needed.  

## 2019-10-17 NOTE — Assessment & Plan Note (Signed)
Continue to follow with urology. Call with any concerns.

## 2019-10-18 ENCOUNTER — Telehealth: Payer: Self-pay

## 2019-10-18 NOTE — Telephone Encounter (Signed)
PA for Lorazepam initiated and submitted via Cover My Meds. Key: [id number]

## 2019-10-19 NOTE — Telephone Encounter (Signed)
PA approved.

## 2019-11-17 DIAGNOSIS — L570 Actinic keratosis: Secondary | ICD-10-CM | POA: Diagnosis not present

## 2019-11-17 DIAGNOSIS — Z86018 Personal history of other benign neoplasm: Secondary | ICD-10-CM | POA: Diagnosis not present

## 2019-11-17 DIAGNOSIS — Z872 Personal history of diseases of the skin and subcutaneous tissue: Secondary | ICD-10-CM | POA: Diagnosis not present

## 2019-11-17 DIAGNOSIS — L578 Other skin changes due to chronic exposure to nonionizing radiation: Secondary | ICD-10-CM | POA: Diagnosis not present

## 2019-12-06 DIAGNOSIS — B0050 Herpesviral ocular disease, unspecified: Secondary | ICD-10-CM | POA: Diagnosis not present

## 2019-12-06 DIAGNOSIS — H43813 Vitreous degeneration, bilateral: Secondary | ICD-10-CM | POA: Diagnosis not present

## 2019-12-06 DIAGNOSIS — H33312 Horseshoe tear of retina without detachment, left eye: Secondary | ICD-10-CM | POA: Diagnosis not present

## 2019-12-06 DIAGNOSIS — H353131 Nonexudative age-related macular degeneration, bilateral, early dry stage: Secondary | ICD-10-CM | POA: Diagnosis not present

## 2019-12-07 DIAGNOSIS — Z23 Encounter for immunization: Secondary | ICD-10-CM | POA: Diagnosis not present

## 2019-12-13 DIAGNOSIS — H43813 Vitreous degeneration, bilateral: Secondary | ICD-10-CM | POA: Diagnosis not present

## 2019-12-13 DIAGNOSIS — H33312 Horseshoe tear of retina without detachment, left eye: Secondary | ICD-10-CM | POA: Diagnosis not present

## 2019-12-13 DIAGNOSIS — H353131 Nonexudative age-related macular degeneration, bilateral, early dry stage: Secondary | ICD-10-CM | POA: Diagnosis not present

## 2019-12-13 DIAGNOSIS — B0050 Herpesviral ocular disease, unspecified: Secondary | ICD-10-CM | POA: Diagnosis not present

## 2019-12-20 ENCOUNTER — Ambulatory Visit (INDEPENDENT_AMBULATORY_CARE_PROVIDER_SITE_OTHER): Payer: Medicare Other | Admitting: Internal Medicine

## 2019-12-20 VITALS — BP 146/83 | HR 64 | Ht 70.0 in | Wt 221.0 lb

## 2019-12-20 DIAGNOSIS — G4733 Obstructive sleep apnea (adult) (pediatric): Secondary | ICD-10-CM

## 2019-12-20 DIAGNOSIS — K219 Gastro-esophageal reflux disease without esophagitis: Secondary | ICD-10-CM | POA: Diagnosis not present

## 2019-12-20 DIAGNOSIS — Z7189 Other specified counseling: Secondary | ICD-10-CM | POA: Insufficient documentation

## 2019-12-20 DIAGNOSIS — Z9989 Dependence on other enabling machines and devices: Secondary | ICD-10-CM

## 2019-12-20 DIAGNOSIS — J42 Unspecified chronic bronchitis: Secondary | ICD-10-CM | POA: Diagnosis not present

## 2019-12-20 NOTE — Patient Instructions (Signed)

## 2019-12-20 NOTE — Progress Notes (Signed)
[facility] [place], [place] [address]  Pulmonary Sleep Medicine   Office Visit Note  Patient Name: Jonathan Burns DOB: Jul 26, 1945 MRN [id number]    Chief Complaint: Obstructive Sleep Apnea visit  Brief History:  Jonathan Burns is seen today for follow up The patient has a 5 year history of sleep apnea. Patient is using PAP nightly.  The patient feels much more rested after sleeping with PAP.  The patient reports benefiting from PAP use. His acid reflux is gone Reported sleepiness is  improved and the Epworth Sleepiness Score is 3 out of 24. The patient doe take a brief nap after lunch. The patient complains of the following: occasional dry mouth but will adjust the humidity setting  The compliance download shows very good compliance with an average use time of 6.9 hours. The AHI is 1.2  The patient does not complain of RLS as long as he stays on the supplemental iron.    ROS  General: (-) fever, (-) chills, (-) night sweat Nose and Sinuses: (-) nasal stuffiness or itchiness, (-) postnasal drip, (-) nosebleeds, (-) sinus trouble. Mouth and Throat: (-) sore throat, (-) hoarseness. Neck: (-) swollen glands, (-) enlarged thyroid, (-) neck pain. Respiratory: - cough, - shortness of breath, - wheezing. Neurologic: - numbness, - tingling. Psychiatric: - anxiety, - depression   Current Medication: Outpatient Encounter Medications as of 12/20/2019  Medication Sig Note  . amLODipine (NORVASC) 10 MG tablet Take 1 tablet (10 mg total) by mouth daily.   . baclofen (LIORESAL) 10 MG tablet Take 1 tablet (10 mg total) by mouth at bedtime.   . carvedilol (COREG) 25 MG tablet Take 1 tablet (25 mg total) by mouth 2 (two) times daily with a meal.   . Ferrous Sulfate (IRON) 325 (65 Fe) MG TABS Take 1 tablet (325 mg total) by mouth daily.   . hydrochlorothiazide (HYDRODIURIL) 25 MG tablet hydrochlorothiazide 25 mg tablet   . LORazepam (ATIVAN) 0.5 MG tablet Take 1 tablet (0.5 mg  total) by mouth daily as needed for anxiety.   Marland Kitchen losartan (COZAAR) 100 MG tablet Take 1 tablet (100 mg total) by mouth daily.   . pantoprazole (PROTONIX) 40 MG tablet Take 1 tablet (40 mg total) by mouth daily.   Marland Kitchen saccharomyces boulardii (FLORASTOR) 250 MG capsule Take 1 capsule (250 mg total) by mouth 2 (two) times daily.   . tamsulosin (FLOMAX) 0.4 MG CAPS capsule Take 1 capsule (0.4 mg total) by mouth daily.   Marland Kitchen trifluridine (VIROPTIC) 1 % ophthalmic solution trifluridine 1 % eye drops   . valACYclovir (VALTREX) 500 MG tablet Take 500 mg by mouth daily. 05/24/2019: As needed    No facility-administered encounter medications on file as of 12/20/2019.    Surgical History: Past Surgical History:  Procedure Laterality Date  . COLONOSCOPY  2015  . ESOPHAGOGASTRODUODENOSCOPY  2015  . FLEXIBLE BRONCHOSCOPY Left 01/20/2019   Procedure: FLEXIBLE BRONCHOSCOPY;  Surgeon: Nestor Lewandowsky, MD;  Location: [facility] ORS;  Service: Thoracic;  Laterality: Left;  . THORACOTOMY Left 01/20/2019   Procedure: CONVERTED TO THORACOTOMY MAJOR;  Surgeon: Nestor Lewandowsky, MD;  Location: [facility] ORS;  Service: Thoracic;  Laterality: Left;  Marland Kitchen VIDEO ASSISTED THORACOSCOPY (VATS)/THOROCOTOMY Left 01/20/2019   Procedure: VIDEO ASSISTED THORACOSCOPY (VATS)/THOROCOTOMY;  Surgeon: Nestor Lewandowsky, MD;  Location: [facility] ORS;  Service: Thoracic;  Laterality: Left;    Medical History: Past Medical History:  Diagnosis Date  . Anemia   . BPH (benign prostatic hypertrophy)   .  Cancer ([place])   . Chronic GI bleeding   . COPD (chronic obstructive pulmonary disease) ([place])   . Hyperlipidemia   . Hypertension   . Prostate nodule   . PUD (peptic ulcer disease)   . Rising PSA level     Family History: Non contributory to the present illness  Social History: Social History   Socioeconomic History  . Marital status: Married    Spouse name: Not on file  . Number of children: Not on file  . Years of education: Not on file  . Highest  education level: Associate degree: academic program  Occupational History  . Not on file  Tobacco Use  . Smoking status: Former Smoker    Packs/day: 1.00    Years: 30.00    Pack years: 30.00    Types: Cigarettes    Quit date: 08/17/1986    Years since quitting: 33.3  . Smokeless tobacco: Never Used  Vaping Use  . Vaping Use: Never used  Substance and Sexual Activity  . Alcohol use: Yes    Alcohol/week: 2.0 standard drinks    Types: 2 Standard drinks or equivalent per week    Comment: twice a week, mixes up between drinks   . Drug use: No  . Sexual activity: Not on file  Other Topics Concern  . Not on file  Social History Narrative   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    Involved in [place] activities    Social Determinants of Health   Financial Resource Strain: Low Risk   . Difficulty of Paying Living Expenses: Not hard at all  Food Insecurity: No Food Insecurity  . Worried About Charity fundraiser in the Last Year: Never true  . Ran Out of Food in the Last Year: Never true  Transportation Needs: No Transportation Needs  . Lack of Transportation (Medical): No  . Lack of Transportation (Non-Medical): No  Physical Activity: Insufficiently Active  . Days of Exercise per Week: 5 days  . Minutes of Exercise per Session: 20 min  Stress:   . Feeling of Stress : Not on file  Social Connections: Socially Integrated  . Frequency of Communication with Friends and Family: More than three times a week  . Frequency of Social Gatherings with Friends and Family: More than three times a week  . Attends Religious Services: More than 4 times per year  . Active Member of Clubs or Organizations: Yes  . Attends Archivist Meetings: More than 4 times per year  . Marital Status: Married  Human resources officer Violence:   . Fear of Current or Ex-Partner: Not on file  . Emotionally Abused: Not on file  . Physically Abused: Not on file  . Sexually Abused: Not on file    Vital  Signs: Blood pressure (!) 146/83, pulse 64, height _0  (1.778 m), weight 221 lb (100.2 kg), SpO2 98 %.  Examination: General Appearance: The patient is well-developed, well-nourished, and in no distress. Neck Circumference: 42 Skin: Gross inspection of skin unremarkable. Head: normocephalic, no gross deformities. Eyes: no gross deformities noted. ENT: ears appear grossly normal Neurologic: Alert and oriented. No involuntary movements.    EPWORTH SLEEPINESS SCALE:  Scale:  (0)= no chance of dozing; (1)= slight chance of dozing; (2)= moderate chance of dozing; (3)= high chance of dozing  Chance  Situtation    Sitting and reading: 1    Watching TV: 0    Sitting Inactive in public: 0    As a passenger in  car: 0      Lying down to rest: 1    Sitting and talking: 0    Sitting quielty after lunch: 1    In a car, stopped in traffic: 0   TOTAL SCORE:   3 out of 24    SLEEP STUDIES:  1. PSG7/2016 AHI 8 SpO6mn 87%   CPAP COMPLIANCE DATA:  Date Range: 12/16/18-12/15/19  Average Daily Use: 6.9 hours  Median Use: 6.9  Compliance for > 4 Hours: 86 %  AHI: 1.2 respiratory events per hour  Days Used: 326/365  Mask Leak: 2.5  95th Percentile Pressure: 15  LABS: Recent Results (from the past 2160 hour(s))  Microalbumin, Urine Waived     Status: None   Collection Time: 10/15/19  9:45 AM  Result Value Ref Range   Microalb, Ur Waived 10 0 - 19 mg/L   Creatinine, Urine Waived 200 10 - 300 mg/dL   Microalb/Creat Ratio <30 <30 mg/g    Comment:                              Abnormal:       30 - 300                         High Abnormal:           >300   Urinalysis, Routine w reflex microscopic     Status: None   Collection Time: 10/15/19  9:45 AM  Result Value Ref Range   Specific Gravity, UA 1.015 1.005 - 1.030   pH, UA 7.0 5.0 - 7.5   Color, UA Yellow Yellow   Appearance Ur Clear Clear   Leukocytes,UA Negative Negative   Protein,UA Negative  Negative/Trace   Glucose, UA Negative Negative   Ketones, UA Negative Negative   RBC, UA Negative Negative   Bilirubin, UA Negative Negative   Urobilinogen, Ur 0.2 0.2 - 1.0 mg/dL   Nitrite, UA Negative Negative  CBC with Differential/Platelet     Status: None   Collection Time: 10/15/19  9:47 AM  Result Value Ref Range   WBC 4.6 3.4 - 10.8 x10E3/uL   RBC 4.44 4.14 - 5.80 x10E6/uL   Hemoglobin 14.4 13.0 - 17.7 g/dL   Hematocrit 42.1 37.5 - 51.0 %   MCV 95 79 - 97 fL   MCH 32.4 26.6 - 33.0 pg   MCHC 34.2 31 - 35 g/dL   RDW 12.8 11.6 - 15.4 %   Platelets 222 150 - 450 x10E3/uL   Neutrophils 65 Not Estab. %   Lymphs 15 Not Estab. %   Monocytes 15 Not Estab. %   Eos 5 Not Estab. %   Basos 0 Not Estab. %   Neutrophils Absolute 3.0 1.40 - 7.00 x10E3/uL   Lymphocytes Absolute 0.7 0 - 3 x10E3/uL   Monocytes Absolute 0.7 0 - 0 x10E3/uL   EOS (ABSOLUTE) 0.2 0.0 - 0.4 x10E3/uL   Basophils Absolute 0.0 0 - 0 x10E3/uL   Immature Granulocytes 0 Not Estab. %   Immature Grans (Abs) 0.0 0.0 - 0.1 x10E3/uL  Comprehensive metabolic panel     Status: None   Collection Time: 10/15/19  9:47 AM  Result Value Ref Range   Glucose 99 65 - 99 mg/dL   BUN 11 8 - 27 mg/dL   Creatinine, Ser 0.92 0.76 - 1.27 mg/dL   GFR calc non Af Amer 81 >59  mL/min/1.73   GFR calc Af Amer 94 >59 mL/min/1.73    Comment: **[facility] currently reports eGFR in compliance with the current**   recommendations of the [employer]. [facility] will   update reporting as new guidelines are published from the NKF-ASN   Task force.    BUN/Creatinine Ratio 12 10 - 24   Sodium 140 134 - 144 mmol/L   Potassium 5.2 3.5 - 5.2 mmol/L   Chloride 101 96 - 106 mmol/L   CO2 27 20 - 29 mmol/L   Calcium 9.7 8.6 - 10.2 mg/dL   Total Protein 6.9 6.0 - 8.5 g/dL   Albumin 4.3 3.7 - 4.7 g/dL   Globulin, Total 2.6 1.5 - 4.5 g/dL   Albumin/Globulin Ratio 1.7 1.2 - 2.2   Bilirubin Total 0.4 0.0 - 1.2 mg/dL   Alkaline Phosphatase  120 48 - 121 IU/L   AST 24 0 - 40 IU/L   ALT 23 0 - 44 IU/L  Lipid Panel w/o Chol/HDL Ratio     Status: Abnormal   Collection Time: 10/15/19  9:47 AM  Result Value Ref Range   Cholesterol, Total 211 (H) 100 - 199 mg/dL   Triglycerides 140 0 - 149 mg/dL   HDL 55 >39 mg/dL   VLDL Cholesterol Cal 25 5 - 40 mg/dL   LDL Chol Calc (NIH) 131 (H) 0 - 99 mg/dL  Iron and TIBC     Status: None   Collection Time: 10/15/19  9:47 AM  Result Value Ref Range   Total Iron Binding Capacity 302 250 - 450 ug/dL   UIBC 230 111 - 343 ug/dL   Iron 72 38 - 169 ug/dL   Iron Saturation 24 15 - 55 %  PSA     Status: None   Collection Time: 10/15/19  9:47 AM  Result Value Ref Range   Prostate Specific Ag, Serum <0.1 0.0 - 4.0 ng/mL    Comment: Roche ECLIA methodology. According to the American Urological Association, Serum PSA should decrease and remain at undetectable levels after radical prostatectomy. The AUA defines biochemical recurrence as an initial PSA value 0.2 ng/mL or greater followed by a subsequent confirmatory PSA value 0.2 ng/mL or greater. Values obtained with different assay methods or kits cannot be used interchangeably. Results cannot be interpreted as absolute evidence of the presence or absence of malignant disease.   TSH     Status: None   Collection Time: 10/15/19  9:47 AM  Result Value Ref Range   TSH 2.660 0.450 - 4.500 uIU/mL  Ferritin     Status: None   Collection Time: 10/15/19  9:47 AM  Result Value Ref Range   Ferritin 103 30.0 - 400.0 ng/mL    Radiology: No results found.  No results found.  No results found.    Assessment and Plan: Patient Active Problem List   Diagnosis Date Noted  . Acquired trigger finger of left middle finger 06/16/2019  . PUD (peptic ulcer disease)   . Empyema ([place]) 01/20/2019  . Hypokalemia   . Empyema of left pleural space ([place]) 01/13/2019  . GERD (gastroesophageal reflux disease) 08/25/2018  . Iron (Fe) deficiency anemia  08/25/2018  . Trigger finger, right middle finger 02/02/2018  . Advanced care planning/counseling discussion 05/27/2017  . Prostate cancer ([place]) 05/27/2017  . BPH with obstruction/lower urinary tract symptoms 04/15/2015  . Acute anxiety 02/23/2015  . Prostate nodule 10/10/2014  . OSA on CPAP 08/31/2014  . Chronic GI bleeding 08/17/2014  . Hyperlipidemia   .  HTN (hypertension), benign   . COPD (chronic obstructive pulmonary disease) ([place])       The patient does tolerate PAP and reports significant benefit from PAP use. The patient was reminded how to clean his machine and advised to stop using the [place]. The patient was also counselled on importance of regular exercise and watching his diet. The compliance is very good. The very well controlled. He missed month of use following lung surgery.  1. OSA- continue nightly use of CPAP. 2. CPAP couseling-Discussed importance of adequate CPAP use as well as proper care and cleaning techniques of machine and all supplies. 3. GERD-Reports symptoms are stable at this time 4. COPD-Breathing well controlled, not currently on daily inhaler therapy  General Counseling: I have discussed the findings of the evaluation and examination with Winfred.  I have also discussed any further diagnostic evaluation thatmay be needed or ordered today. Anais verbalizes understanding of the findings of todays visit. We also reviewed his medications today and discussed drug interactions and side effects including but not limited excessive drowsiness and altered mental states. We also discussed that there is always a risk not just to him but also people around him. he has been encouraged to call the office with any questions or concerns that should arise related to todays visit.   I have personally obtained a history, examined the patient, evaluated laboratory and imaging results, formulated the assessment and plan and placed orders.  This patient was seen by Luiz Ochoa, AGNP-C in collaboration with Dr. Devona Konig as a part of collaborative care agreement.  Richelle Ito Saunders Glance, PhD, FAASM  Diplomate, American Board of Sleep Medicine    Allyne Gee, MD [facility] Diplomate ABMS Pulmonary and Critical Care Medicine Sleep medicine

## 2020-01-04 IMAGING — CT CT ABD-PELV W/ CM
2 of 5 series · 16 of 46 positions shown, 18 images · IV contrast (iopamidol)
Comparison: None.

CLINICAL DATA: Patient with history of prostate cancer. Staging
evaluation.

EXAM:
CT ABDOMEN AND PELVIS WITH CONTRAST
TECHNIQUE: Multidetector CT imaging of the abdomen and pelvis was performed
using the standard protocol following bolus administration of
intravenous contrast.
CONTRAST:  100mL RMVPE5-KTT IOPAMIDOL (RMVPE5-KTT) INJECTION 61%

[Series 2: axial st · axial · 0.89mm/px · z∈[-496,-71]mm · 13 of 95 slices shown, 15 images]
[im 5/95  soft-tissue]
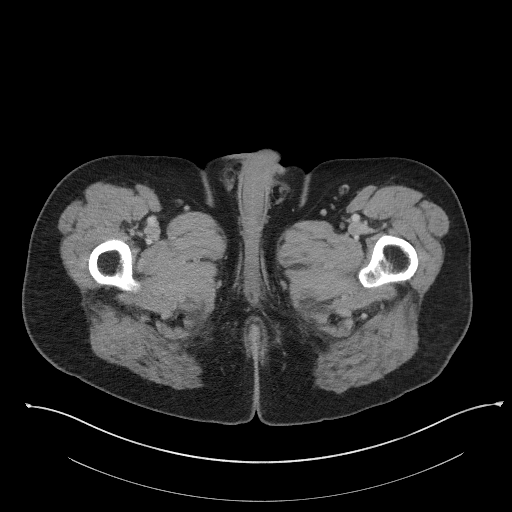
[im 5/95  bone]
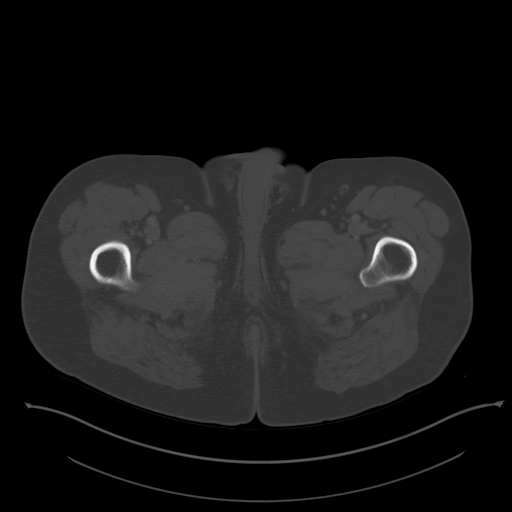
[im 15/95  soft-tissue]
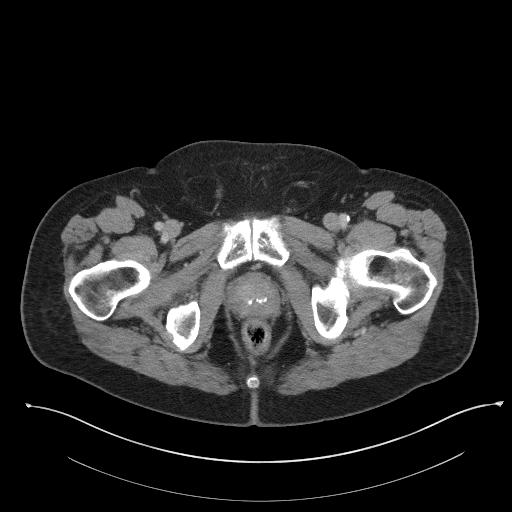
[im 19/95  soft-tissue]
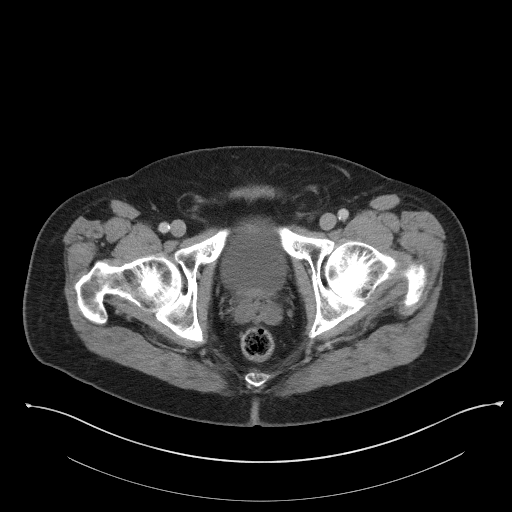
[im 29/95  soft-tissue]
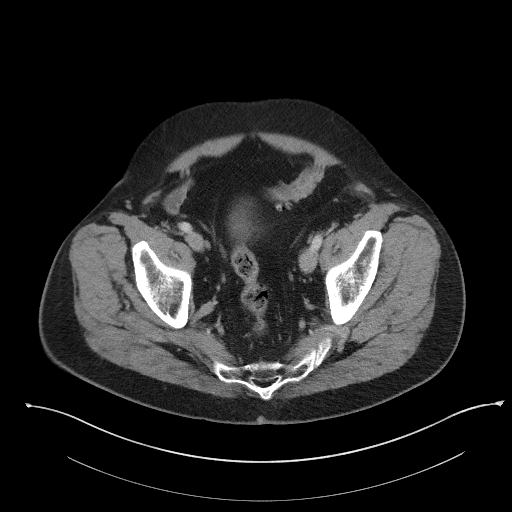
[im 33/95  soft-tissue]
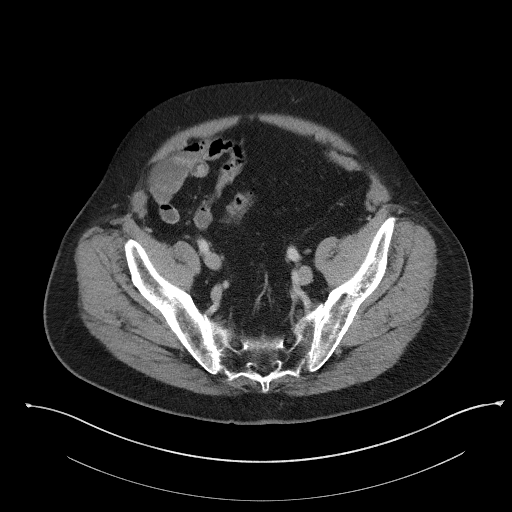
[im 43/95  soft-tissue]
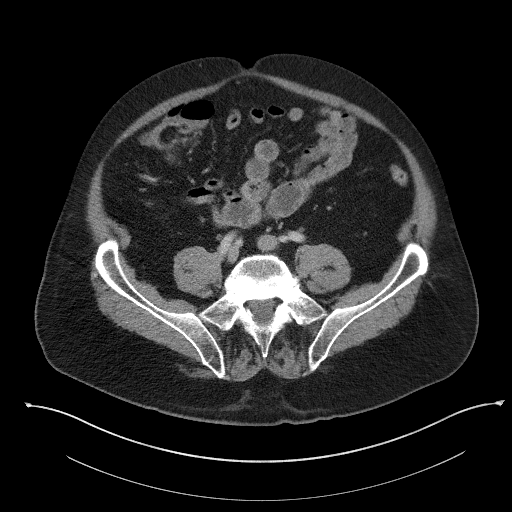
[im 48/95  soft-tissue]
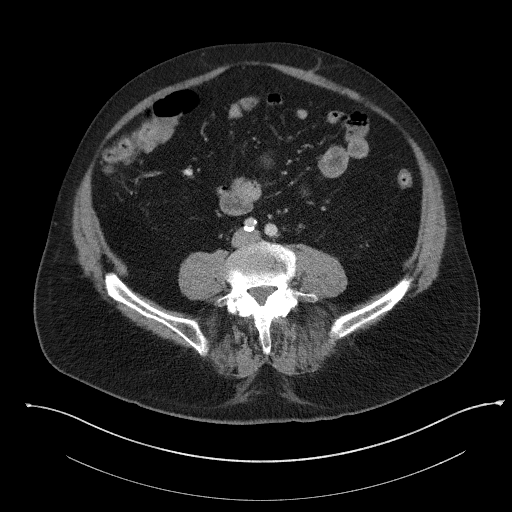
[im 52/95  soft-tissue]
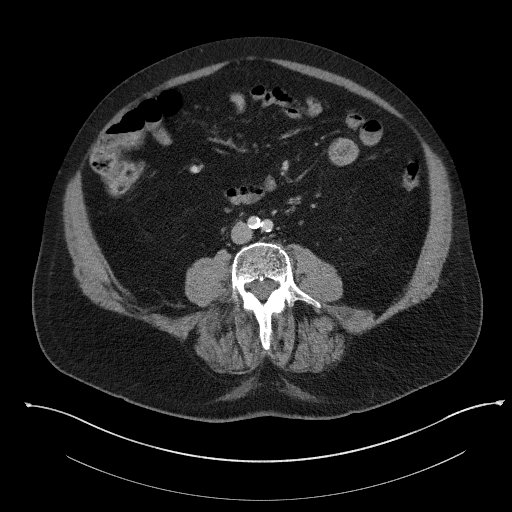
[im 62/95  soft-tissue]
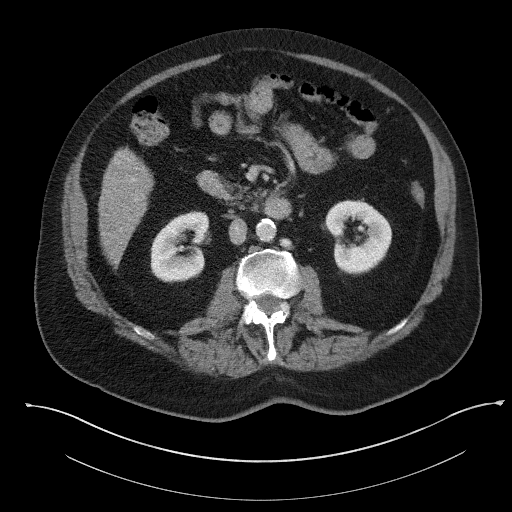
[im 62/95  bone]
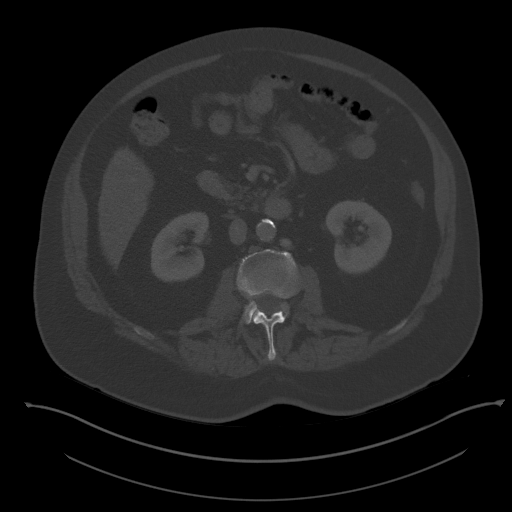
[im 66/95  soft-tissue]
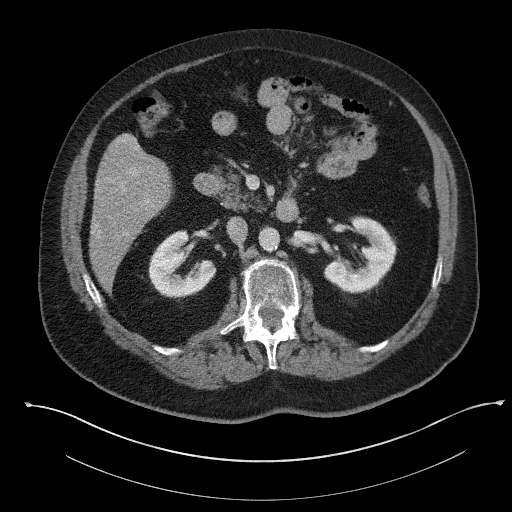
[im 76/95  soft-tissue]
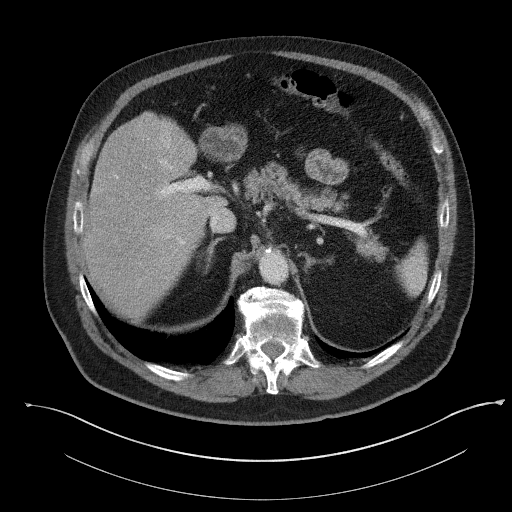
[im 80/95  soft-tissue]
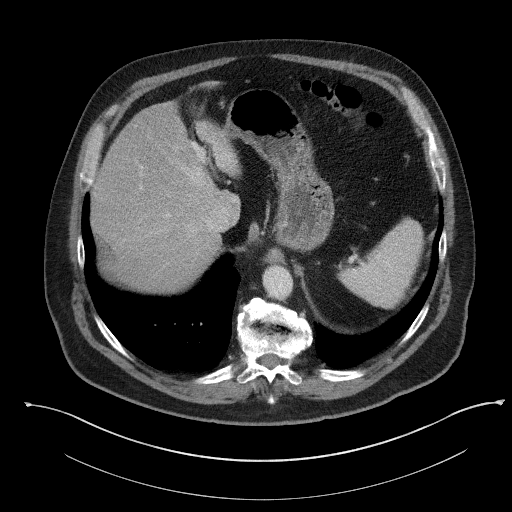
[im 90/95  soft-tissue]
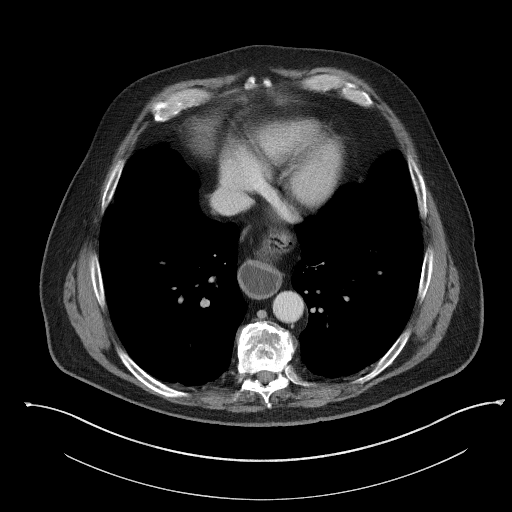

[Series 6: coronal st · coronal · 0.84mm/px · 3 of 113 slices shown]
[im 38/113  soft-tissue]
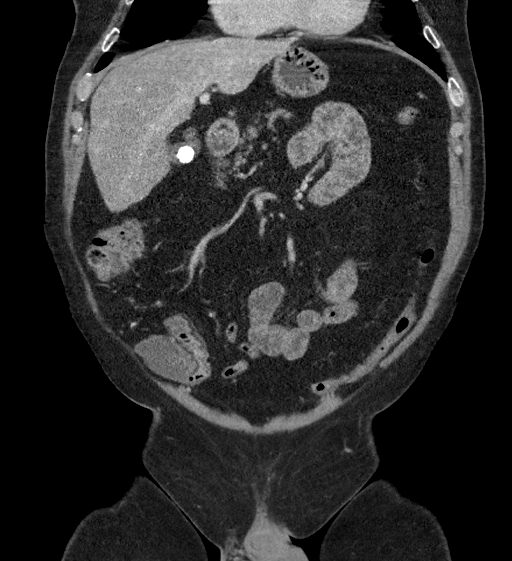
[im 50/113  soft-tissue]
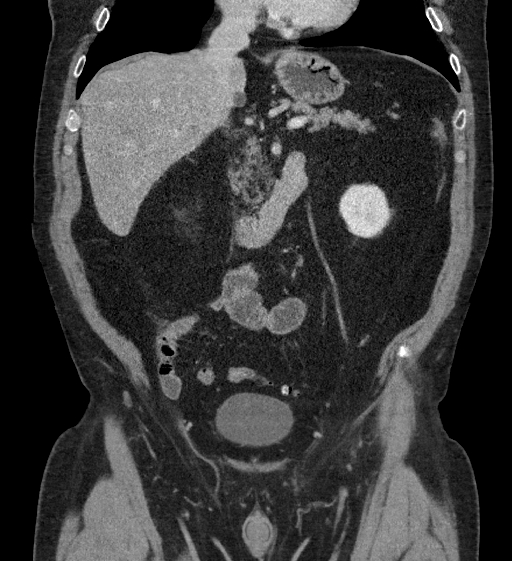
[im 63/113  soft-tissue]
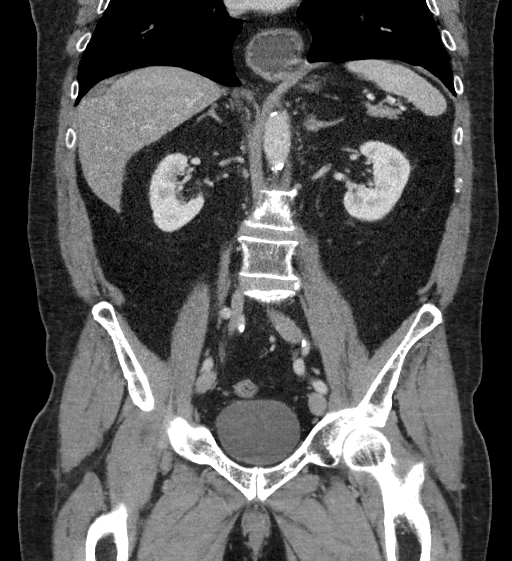

[16 of 46 positions shown; findings below may reference images not displayed]

FINDINGS: Lower chest: Normal heart size. Dependent atelectasis within the
bilateral lower lobes.

Hepatobiliary: Within the left hepatic lobe there is a 2.8 x 2.3 cm
fluid attenuation structure most compatible with hepatic cyst (image
14; series 2). Within the caudate lobe there is a 1.4 cm fluid
attenuation structure most compatible with a cyst (image 18; series
2). Mild patchy attenuation of the hepatic parenchyma. Transient
hepatic perfusion right hepatic lobe. Stone within the gallbladder
lumen. No gallbladder wall thickening or pericholecystic fluid. No
intrahepatic or extrahepatic biliary ductal dilatation.

Pancreas: Unremarkable

Spleen: Unremarkable

Adrenals/Urinary Tract: Adrenal glands are normal. Kidneys enhance
symmetrically with contrast. Bilateral subcentimeter low-attenuation
renal lesions are demonstrated and are too small to characterize. No
hydronephrosis. Urinary bladder is unremarkable.

Stomach/Bowel: Sigmoid colonic diverticulosis. No CT evidence for
acute diverticulitis. Moderate-sized hiatal hernia. Normal
morphology of the stomach. No evidence for small bowel obstruction.
No free fluid or free intraperitoneal air.

Vascular/Lymphatic: Normal caliber abdominal aorta. Peripheral
calcified atherosclerotic plaque. Retroaortic left renal vein.
Prominent subcentimeter retroperitoneal and pelvic lymph nodes. No
enlarged adenopathy.

Reproductive: Central dystrophic calcifications in the prostate.

Other: Small bilateral fat containing inguinal hernias.

Musculoskeletal: Lumbar spine degenerative changes. No aggressive or
acute appearing osseous lesions.
IMPRESSION: 1. No evidence for metastatic disease within the abdomen or pelvis.
2. Cholelithiasis.
3. Bilateral fat containing inguinal hernias.

## 2020-01-04 IMAGING — NM NM BONE WHOLE BODY
2 series · 10 of 10 positions shown · non-contrast
Comparison: CT abdomen and pelvis May 08, 2017

CLINICAL DATA: Prostate carcinoma

EXAM:
NUCLEAR MEDICINE WHOLE BODY BONE SCAN
TECHNIQUE: Whole body anterior and posterior images were obtained approximately
3 hours after intravenous injection of radiopharmaceutical.
RADIOPHARMACEUTICALS:  23.2 mCi Rechnetium-ZZm MDP IV

[Series 1000: statics · 2.40mm/px · 4 acquisitions, 8 frames shown]
[im 1/4]
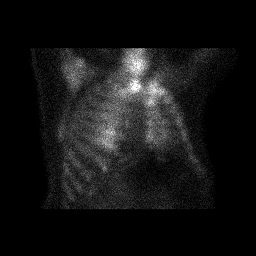
[im 1/4]
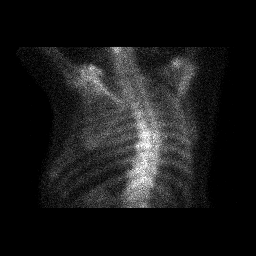
[im 2/4]
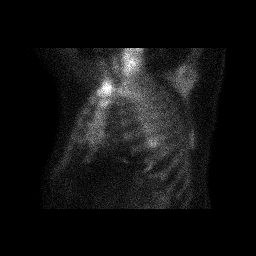
[im 2/4]
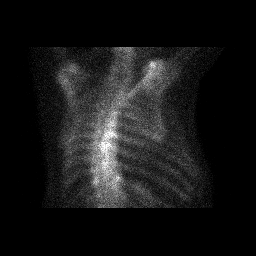
[im 3/4]
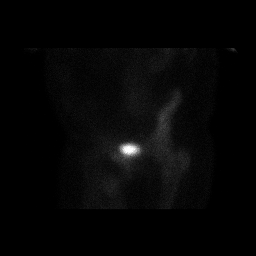
[im 3/4]
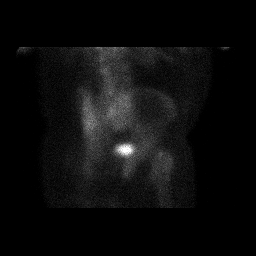
[im 4/4]
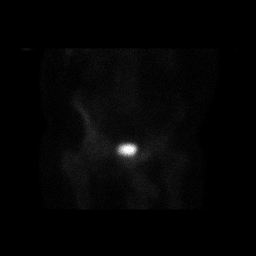
[im 4/4]
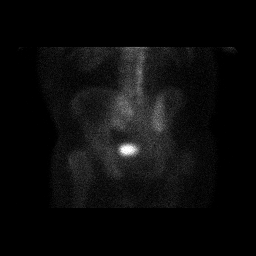

[Series 1000: 3 hr wholebody · 2.40mm/px · 2 of 2 frames shown]
[frame 1/2]
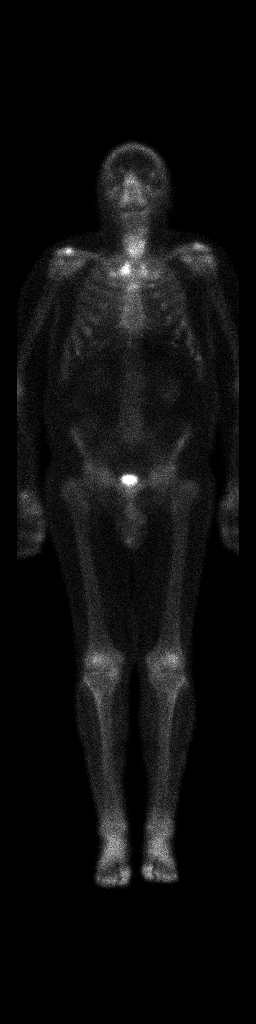
[frame 2/2]
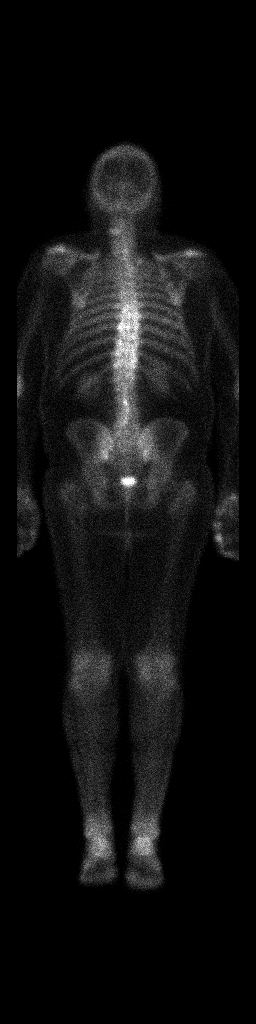

[10 of 10 positions shown; findings below may reference images not displayed]

FINDINGS: There are areas of increased radiotracer uptake throughout the mid
to distal thoracic spine, likely due to arthropathic change.
Increased uptake in both shoulders and right supraclavicular joint
likely represent arthropathy. Milder increased uptake in the wrists
and knees is likely of arthropathic etiology. Kidneys are noted in
the flank positions bilaterally.
IMPRESSION: Areas of abnormal uptake, most likely of arthropathic etiology. No
findings are evident which are felt to be suspicious for bony
metastatic disease.

## 2020-03-13 ENCOUNTER — Other Ambulatory Visit: Payer: Self-pay | Admitting: Family Medicine

## 2020-03-13 DIAGNOSIS — C61 Malignant neoplasm of prostate: Secondary | ICD-10-CM

## 2020-03-15 ENCOUNTER — Other Ambulatory Visit: Payer: Medicare Other

## 2020-03-15 ENCOUNTER — Other Ambulatory Visit: Payer: Self-pay

## 2020-03-15 DIAGNOSIS — C61 Malignant neoplasm of prostate: Secondary | ICD-10-CM | POA: Diagnosis not present

## 2020-03-16 LAB — PSA: Prostate Specific Ag, Serum: <0.1 ng/mL (ref 0.0–4.0)

## 2020-03-16 NOTE — Progress Notes (Signed)
05/06/2018 11:55 AM   Jonathan Burns Jonathan Burns 08/01/1945 [id number]  Referring provider: Valerie Roys, DO [place],  [place] [address]  Chief Complaint  Patient presents with  . Prostate Cancer   Urological history 1.  Prostate cancer - underwent a biopsy for an elevated PSA of 6.0, and a right-sided prostate nodule. TRUS volume 33 cc.  Prostate biopsy revealed 12 of 12 cores positive including lesions up to Gleason 4+4 involving 80% of the tissue on the right lateral mid core 03/2017 - completed 07/2017 IM RT radiation therapy to both his prostate and pelvic nodes - currently on ADT - taking calcium and Vitamin D - PSA <0.1 ng/mL in 02/2020  2. LU TS - I PSS 5/1 - managed with tamsulosin 0.4 mg daily   3.  High risk hematuria  -  former smoker - CT with contrast 12/2018  noted The adrenal glands are unremarkable. There is no hydronephrosis on either side. There is symmetric enhancement and excretion of contrast by both kidneys. Subcentimeter bilateral renal hypodense foci are too small to characterize. The visualized ureters appear unremarkable. The urinary bladder is collapsed. There is apparent diffuse thickening of the bladder wall which may be partly related to underdistention. Cystitis is not excluded. Correlation with urinalysis recommended.  Partially visualized loculated appearing moderate-sized left pleural effusion with findings concerning for empyema - for which he was hospitalized - Cysto 03/29/2019 with Dr. Bernardo Heater Mild lateral lobe enlargement prostate, mild hypervascularity prostate - UA 3- 10 RBC's    HPI: Jonathan Burns is a 75 y.o. male who presents today for 55-monthfollow-up.  He has nocturia x 2.  Patient denies any modifying or aggravating factors.  Patient denies any gross hematuria, dysuria or suprapubic/flank pain.  Patient denies any fevers, chills, nausea or vomiting.    IPSS    Row Name 03/17/20 0800         International Prostate Symptom Score    How often have you had the sensation of not emptying your bladder? Less than half the time     How often have you had to urinate less than every two hours? Not at All     How often have you found you stopped and started again several times when you urinated? Not at All     How often have you found it difficult to postpone urination? Less than 1 in 5 times     How often have you had a weak urinary stream? Not at All     How often have you had to strain to start urination? Not at All     How many times did you typically get up at night to urinate? 2 Times     Total IPSS Score 5           Quality of Life due to urinary symptoms   If you were to spend the rest of your life with your urinary condition just the way it is now how would you feel about that? Pleased           Score:  1-7 Mild 8-19 Moderate 20-35 Severe   PMH: Past Medical History:  Diagnosis Date  . Anemia   . BPH (benign prostatic hypertrophy)   . Cancer ([place]   . Chronic GI bleeding   . COPD (chronic obstructive pulmonary disease) ([place]   . Hyperlipidemia   . Hypertension   . Prostate nodule   . PUD (peptic ulcer disease)   .  Rising PSA level     Surgical History: Past Surgical History:  Procedure Laterality Date  . COLONOSCOPY  2015  . ESOPHAGOGASTRODUODENOSCOPY  2015  . FLEXIBLE BRONCHOSCOPY Left 01/20/2019   Procedure: FLEXIBLE BRONCHOSCOPY;  Surgeon: Nestor Lewandowsky, MD;  Location: [facility] ORS;  Service: Thoracic;  Laterality: Left;  . THORACOTOMY Left 01/20/2019   Procedure: CONVERTED TO THORACOTOMY MAJOR;  Surgeon: Nestor Lewandowsky, MD;  Location: [facility] ORS;  Service: Thoracic;  Laterality: Left;  Marland Kitchen VIDEO ASSISTED THORACOSCOPY (VATS)/THOROCOTOMY Left 01/20/2019   Procedure: VIDEO ASSISTED THORACOSCOPY (VATS)/THOROCOTOMY;  Surgeon: Nestor Lewandowsky, MD;  Location: [facility] ORS;  Service: Thoracic;  Laterality: Left;    Home Medications:  Allergies as of 03/17/2020   No Known Allergies     Medication List        Accurate as of March 17, 2020 11:59 PM. If you have any questions, ask your nurse or doctor.        amLODipine 10 MG tablet Commonly known as: NORVASC Take 1 tablet (10 mg total) by mouth daily.   baclofen 10 MG tablet Commonly known as: LIORESAL Take 1 tablet (10 mg total) by mouth at bedtime.   carvedilol 25 MG tablet Commonly known as: COREG Take 1 tablet (25 mg total) by mouth 2 (two) times daily with a meal.   hydrochlorothiazide 25 MG tablet Commonly known as: HYDRODIURIL hydrochlorothiazide 25 mg tablet   Iron 325 (65 Fe) MG Tabs Take 1 tablet (325 mg total) by mouth daily.   LORazepam 0.5 MG tablet Commonly known as: ATIVAN Take 1 tablet (0.5 mg total) by mouth daily as needed for anxiety.   losartan 100 MG tablet Commonly known as: COZAAR Take 1 tablet (100 mg total) by mouth daily.   pantoprazole 40 MG tablet Commonly known as: PROTONIX Take 1 tablet (40 mg total) by mouth daily.   saccharomyces boulardii 250 MG capsule Commonly known as: FLORASTOR Take 1 capsule (250 mg total) by mouth 2 (two) times daily.   tamsulosin 0.4 MG Caps capsule Commonly known as: FLOMAX Take 1 capsule (0.4 mg total) by mouth daily.   trifluridine 1 % ophthalmic solution Commonly known as: VIROPTIC trifluridine 1 % eye drops   valACYclovir 500 MG tablet Commonly known as: VALTREX Take 500 mg by mouth daily.       Allergies: No Known Allergies  Family History: Family History  Problem Relation Age of Onset  . Cancer Mother        breast  . Diabetes Mother   . Aneurysm Father   . Kidney disease Neg Hx   . Prostate cancer Neg Hx   . Kidney cancer Neg Hx     Social History:  reports that he quit smoking about 33 years ago. His smoking use included cigarettes. He has a 30.00 pack-year smoking history. He has never used smokeless tobacco. He reports current alcohol use of about 2.0 standard drinks of alcohol per week. He reports that he does not use  drugs.  ROS: For pertinent review of systems please refer to history of present illness  Physical Exam: BP 139/79   Pulse 66   Ht _0  (1.778 m)   Wt 210 lb (95.3 kg)   BMI 30.13 kg/m   Constitutional:  Well nourished. Alert and oriented, No acute distress. HEENT: [place] AT, mask in place.  Trachea midline Cardiovascular: No clubbing, cyanosis, or edema. Respiratory: Normal respiratory effort, no increased work of breathing. Neurologic: Grossly intact, no focal deficits, moving all 4 extremities. Psychiatric:  Normal mood and affect.   Laboratory Data: Lab Results  Component Value Date   WBC 4.6 10/15/2019   HGB 14.4 10/15/2019   HCT 42.1 10/15/2019   MCV 95 10/15/2019   PLT 222 10/15/2019    Lab Results  Component Value Date   CREATININE 0.92 10/15/2019   Lab Results  Component Value Date   TSH 2.660 10/15/2019      Component Value Date/Time   CHOL 211 (H) 10/15/2019 0947   CHOL 202 (H) 12/02/2017 0830   HDL 55 10/15/2019 0947   CHOLHDL 3.6 08/25/2018 0839   VLDL 32 (H) 12/02/2017 0830   LDLCALC 131 (H) 10/15/2019 0947   Lab Results  Component Value Date   AST 24 10/15/2019   Lab Results  Component Value Date   ALT 23 10/15/2019   Component     Latest Ref Rng & Units 04/07/2015 03/25/2016 04/05/2016 04/10/2017  Prostate Specific Ag, Serum     0.0 - 4.0 ng/mL 2.2 3.4 3.1 6.0 (H)   Component     Latest Ref Rng & Units 05/06/2018 11/03/2018 03/09/2019 10/15/2019  Prostate Specific Ag, Serum     0.0 - 4.0 ng/mL <0.1 <0.1 <0.1 <0.1   Component     Latest Ref Rng & Units 03/15/2020  Prostate Specific Ag, Serum     0.0 - 4.0 ng/mL <0.1   I have reviewed the labs.  Assessment & Plan:    1. Prostate cancer - PSA undetectable - Status post IR MT radiation therapy to both his prostate and pelvic nodes for stage IIB Gleason 8 adenocarcinoma for the prostate - continue ADT - Eligard 45 mg given today  - RTC in 6 months for PSA and Eligard injection - end date  10/2020  2. BPH with LUTS IPSS score is 4/1, it is slightly improved Continue conservative management, avoiding bladder irritants and timed voiding's Continue tamsulosin 0.4 mg daily - refills given RTC in 6 months for IPSS, PSA and exam   3.  High risk hematuria - work up completed 03/2019  - UA 3-10 RBC's  - No reports of gross hematuria  - continue to monitor - if micro heme persists at next visit will offer CTU     Return in about 6 months (around 09/14/2020) for IPSS, UA, PSA and exam.  Zara Council, [facility]  [facility] [address] [place], [place] [address] [phone]

## 2020-03-17 ENCOUNTER — Encounter: Payer: Self-pay | Admitting: Urology

## 2020-03-17 ENCOUNTER — Ambulatory Visit (INDEPENDENT_AMBULATORY_CARE_PROVIDER_SITE_OTHER): Payer: Medicare Other | Admitting: Urology

## 2020-03-17 ENCOUNTER — Other Ambulatory Visit: Payer: Self-pay

## 2020-03-17 VITALS — BP 139/79 | HR 66 | Ht 70.0 in | Wt 210.0 lb

## 2020-03-17 DIAGNOSIS — R3129 Other microscopic hematuria: Secondary | ICD-10-CM

## 2020-03-17 DIAGNOSIS — R319 Hematuria, unspecified: Secondary | ICD-10-CM | POA: Diagnosis not present

## 2020-03-17 DIAGNOSIS — N401 Enlarged prostate with lower urinary tract symptoms: Secondary | ICD-10-CM

## 2020-03-17 DIAGNOSIS — N138 Other obstructive and reflux uropathy: Secondary | ICD-10-CM | POA: Diagnosis not present

## 2020-03-17 DIAGNOSIS — C61 Malignant neoplasm of prostate: Secondary | ICD-10-CM | POA: Diagnosis not present

## 2020-03-17 LAB — MICROSCOPIC EXAMINATION
Bacteria, UA: NONE SEEN
Epithelial Cells (non renal): NONE SEEN /hpf (ref 0–10)
WBC, UA: NONE SEEN /hpf (ref 0–5)

## 2020-03-17 LAB — URINALYSIS, COMPLETE
Bilirubin, UA: NEGATIVE
Glucose, UA: NEGATIVE
Ketones, UA: NEGATIVE
Leukocytes,UA: NEGATIVE
Nitrite, UA: NEGATIVE
Protein,UA: NEGATIVE
Specific Gravity, UA: 1.02 (ref 1.005–1.030)
Urobilinogen, Ur: 0.2 mg/dL (ref 0.2–1.0)
pH, UA: 6 (ref 5.0–7.5)

## 2020-03-17 MED ORDER — TAMSULOSIN HCL 0.4 MG PO CAPS: 0.4000 mg | ORAL_CAPSULE | Freq: Every day | ORAL | 3 refills | Status: DC

## 2020-03-17 MED ORDER — LEUPROLIDE ACETATE (6 MONTH) 45 MG ~~LOC~~ KIT
45.0000 mg | PACK | Freq: Once | SUBCUTANEOUS | Status: AC
  Administered 2020-03-17: 45 mg via SUBCUTANEOUS

## 2020-03-17 NOTE — Progress Notes (Signed)
Eligard SubQ Injection   Due to Prostate Cancer patient is present today for a Eligard Injection.  Medication: Eligard 6 month Dose: 45 mg  Location: right  Lot: [id number] Exp: 06/2021  Patient tolerated well, no complications were noted  Performed by: Elberta Leatherwood, CMA  Per Larene Beach patient is to continue therapy until September 2022. Patient's next follow up was scheduled for 09/20/2020 for last Eligard. This appointment was scheduled using wheel and given to patient today along with reminder continue on Vitamin D 800-1000iu and Calium 1000-1200mg  daily while on Androgen Deprivation Therapy.  PA approval dates: No PA needed

## 2020-03-23 ENCOUNTER — Other Ambulatory Visit: Payer: Self-pay | Admitting: Family Medicine

## 2020-03-23 DIAGNOSIS — I10 Essential (primary) hypertension: Secondary | ICD-10-CM

## 2020-04-17 ENCOUNTER — Ambulatory Visit (INDEPENDENT_AMBULATORY_CARE_PROVIDER_SITE_OTHER): Payer: Medicare Other | Admitting: Family Medicine

## 2020-04-17 ENCOUNTER — Encounter: Payer: Self-pay | Admitting: Family Medicine

## 2020-04-17 ENCOUNTER — Other Ambulatory Visit: Payer: Self-pay

## 2020-04-17 VITALS — BP 137/76 | HR 62 | Temp 97.7°F | Wt 221.0 lb

## 2020-04-17 DIAGNOSIS — N401 Enlarged prostate with lower urinary tract symptoms: Secondary | ICD-10-CM | POA: Diagnosis not present

## 2020-04-17 DIAGNOSIS — I10 Essential (primary) hypertension: Secondary | ICD-10-CM | POA: Diagnosis not present

## 2020-04-17 DIAGNOSIS — E785 Hyperlipidemia, unspecified: Secondary | ICD-10-CM

## 2020-04-17 DIAGNOSIS — F419 Anxiety disorder, unspecified: Secondary | ICD-10-CM | POA: Diagnosis not present

## 2020-04-17 DIAGNOSIS — N138 Other obstructive and reflux uropathy: Secondary | ICD-10-CM | POA: Diagnosis not present

## 2020-04-17 DIAGNOSIS — J42 Unspecified chronic bronchitis: Secondary | ICD-10-CM

## 2020-04-17 DIAGNOSIS — D508 Other iron deficiency anemias: Secondary | ICD-10-CM | POA: Diagnosis not present

## 2020-04-17 DIAGNOSIS — K219 Gastro-esophageal reflux disease without esophagitis: Secondary | ICD-10-CM

## 2020-04-17 DIAGNOSIS — K922 Gastrointestinal hemorrhage, unspecified: Secondary | ICD-10-CM

## 2020-04-17 MED ORDER — HYDROCHLOROTHIAZIDE 25 MG PO TABS: ORAL_TABLET | ORAL | 1 refills | Status: DC

## 2020-04-17 MED ORDER — LORAZEPAM 0.5 MG PO TABS: 0.5000 mg | ORAL_TABLET | Freq: Every day | ORAL | 0 refills | Status: DC | PRN

## 2020-04-17 MED ORDER — PANTOPRAZOLE SODIUM 40 MG PO TBEC: 40.0000 mg | DELAYED_RELEASE_TABLET | Freq: Every day | ORAL | 1 refills | Status: DC

## 2020-04-17 MED ORDER — LOSARTAN POTASSIUM 100 MG PO TABS: ORAL_TABLET | ORAL | 1 refills | Status: DC

## 2020-04-17 MED ORDER — AMLODIPINE BESYLATE 10 MG PO TABS: 10.0000 mg | ORAL_TABLET | Freq: Every day | ORAL | 1 refills | Status: DC

## 2020-04-17 MED ORDER — TAMSULOSIN HCL 0.4 MG PO CAPS: 0.4000 mg | ORAL_CAPSULE | Freq: Every day | ORAL | 3 refills | Status: DC

## 2020-04-17 MED ORDER — CARVEDILOL 25 MG PO TABS
25.0000 mg | ORAL_TABLET | Freq: Two times a day (BID) | ORAL | 1 refills | Status: DC
Start: 2020-04-17 — End: 2020-10-16

## 2020-04-17 NOTE — Assessment & Plan Note (Signed)
Under good control on current regimen. Continue current regimen. Continue to monitor. Call with any concerns. Refills given. Labs drawn today.   

## 2020-04-17 NOTE — Progress Notes (Signed)
BP 137/76   Pulse 62   Temp 97.7 F (36.5 C)   Wt 221 lb (100.2 kg)   SpO2 97%   BMI 31.71 kg/m    Subjective:    Patient ID: Jonathan Burns, male    DOB: 05/04/1945, 75 y.o.   MRN: [id number]  HPI: ELHADJI PECORE is a 75 y.o. male  Chief Complaint  Patient presents with  . Hyperlipidemia  . COPD  . acute anxiety  . Hypertension  . Gastroesophageal Reflux   HYPERTENSION / [place] Satisfied with current treatment? yes Duration of hypertension: chronic BP monitoring frequency: not checking BP medication side effects: no Past BP meds: losartan, carvedilol, HCTZ, amlodpine Duration of hyperlipidemia: chronic Cholesterol medication side effects: not on anything Cholesterol supplements: none Medication compliance: excellent compliance Aspirin: no Recent stressors: no Recurrent headaches: no Visual changes: no Palpitations: no Dyspnea: no Chest pain: no Lower extremity edema: no Dizzy/lightheaded: no  GERD GERD control status: controlled  Satisfied with current treatment? no Heartburn frequency: rarely Medication side effects: no  Medication compliance: excellent Previous GERD medications: omeprazole Dysphagia: no Odynophagia:  no Hematemesis: no Blood in stool: no EGD: no  ANXIETY/STRESS- takes it 1-2x a month Duration: status Chronic:controlled Anxious mood: yes  Excessive worrying: yes Irritability: no  Sweating: no Nausea: no Palpitations:no Hyperventilation: no Panic attacks: no Agoraphobia: no  Obscessions/compulsions: no Depressed mood: no Depression screen [facility] 2/9 05/24/2019 08/25/2018 04/22/2018 09/17/2017 05/27/2017  Decreased Interest 0 0 0 0 0  Down, Depressed, Hopeless 0 0 0 0 0  PHQ - 2 Score 0 0 0 0 0  Altered sleeping - 0 - - -  Tired, decreased energy - 0 - - -  Change in appetite - 0 - - -  Feeling bad or failure about yourself  - 0 - - -  Trouble concentrating - 0 - - -  Moving slowly or fidgety/restless - 0 - - -  Suicidal  thoughts - 0 - - -  PHQ-9 Score - 0 - - -  Difficult doing work/chores - Not difficult at all - - -   Anhedonia: no Weight changes: no Insomnia: no   Hypersomnia: no Fatigue/loss of energy: no Feelings of worthlessness: no Feelings of guilt: no Impaired concentration/indecisiveness: no Suicidal ideations: no  Crying spells: no Recent Stressors/Life Changes: no   Relationship problems: no   Family stress: no     Financial stress: no    Job stress: no    Recent death/loss: no   Relevant past medical, surgical, family and social history reviewed and updated as indicated. Interim medical history since our last visit reviewed. Allergies and medications reviewed and updated.  Review of Systems  Constitutional: Negative.   Respiratory: Negative.   Cardiovascular: Negative.   Gastrointestinal: Negative.   Musculoskeletal: Negative.   Neurological: Negative.   Psychiatric/Behavioral: Negative.     Per HPI unless specifically indicated above     Objective:    BP 137/76   Pulse 62   Temp 97.7 F (36.5 C)   Wt 221 lb (100.2 kg)   SpO2 97%   BMI 31.71 kg/m   Wt Readings from Last 3 Encounters:  04/17/20 221 lb (100.2 kg)  03/17/20 210 lb (95.3 kg)  12/20/19 221 lb (100.2 kg)    Physical Exam Vitals and nursing note reviewed.  Constitutional:      General: He is not in acute distress.    Appearance: Normal appearance. He is not ill-appearing, toxic-appearing or diaphoretic.  HENT:     Head: Normocephalic and atraumatic.     Right Ear: External ear normal.     Left Ear: External ear normal.     Nose: Nose normal.     Mouth/Throat:     Mouth: Mucous membranes are moist.     Pharynx: Oropharynx is clear.  Eyes:     General: No scleral icterus.       Right eye: No discharge.        Left eye: No discharge.     Extraocular Movements: Extraocular movements intact.     Conjunctiva/sclera: Conjunctivae normal.     Pupils: Pupils are equal, round, and reactive to  light.  Cardiovascular:     Rate and Rhythm: Normal rate and regular rhythm.     Pulses: Normal pulses.     Heart sounds: Normal heart sounds. No murmur heard. No friction rub. No gallop.   Pulmonary:     Effort: Pulmonary effort is normal. No respiratory distress.     Breath sounds: Normal breath sounds. No stridor. No wheezing, rhonchi or rales.  Chest:     Chest wall: No tenderness.  Musculoskeletal:        General: Normal range of motion.     Cervical back: Normal range of motion and neck supple.  Skin:    General: Skin is warm and dry.     Capillary Refill: Capillary refill takes less than 2 seconds.     Coloration: Skin is not jaundiced or pale.     Findings: No bruising, erythema, lesion or rash.  Neurological:     General: No focal deficit present.     Mental Status: He is alert and oriented to person, place, and time. Mental status is at baseline.  Psychiatric:        Mood and Affect: Mood normal.        Behavior: Behavior normal.        Thought Content: Thought content normal.        Judgment: Judgment normal.     Results for orders placed or performed in visit on 03/17/20  Microscopic Examination   Urine  Result Value Ref Range   WBC, UA None seen 0 - 5 /hpf   RBC 3-10 (A) 0 - 2 /hpf   Epithelial Cells (non renal) None seen 0 - 10 /hpf   Bacteria, UA None seen None seen/Few  Urinalysis, Complete  Result Value Ref Range   Specific Gravity, UA 1.020 1.005 - 1.030   pH, UA 6.0 5.0 - 7.5   Color, UA Yellow Yellow   Appearance Ur Clear Clear   Leukocytes,UA Negative Negative   Protein,UA Negative Negative/Trace   Glucose, UA Negative Negative   Ketones, UA Negative Negative   RBC, UA 1+ (A) Negative   Bilirubin, UA Negative Negative   Urobilinogen, Ur 0.2 0.2 - 1.0 mg/dL   Nitrite, UA Negative Negative   Microscopic Examination See below:       Assessment & Plan:   Problem List Items Addressed This Visit      Cardiovascular and Mediastinum   HTN  (hypertension), benign - Primary    Under good control on current regimen. Continue current regimen. Continue to monitor. Call with any concerns. Refills given. Labs drawn today.        Relevant Medications   hydrochlorothiazide (HYDRODIURIL) 25 MG tablet   carvedilol (COREG) 25 MG tablet   amLODipine (NORVASC) 10 MG tablet   losartan (COZAAR) 100 MG tablet  Other Relevant Orders   CBC with Differential/Platelet   Comprehensive metabolic panel     Respiratory   COPD (chronic obstructive pulmonary disease) ([place])    Under good control on current regimen. Continue current regimen. Continue to monitor. Call with any concerns. Refills given. Labs drawn today.          Digestive   Chronic GI bleeding    Under good control on current regimen. Continue current regimen. Continue to monitor. Call with any concerns. Refills given. Labs drawn today.        Relevant Medications   pantoprazole (PROTONIX) 40 MG tablet   GERD (gastroesophageal reflux disease)    Under good control on current regimen. Continue current regimen. Continue to monitor. Call with any concerns. Refills given. Labs drawn today.        Relevant Medications   pantoprazole (PROTONIX) 40 MG tablet   Other Relevant Orders   CBC with Differential/Platelet   Comprehensive metabolic panel     Genitourinary   BPH with obstruction/lower urinary tract symptoms    Under good control on current regimen. Continue current regimen. Continue to monitor. Call with any concerns. Refills given.        Relevant Medications   tamsulosin (FLOMAX) 0.4 MG CAPS capsule   Other Relevant Orders   CBC with Differential/Platelet   Comprehensive metabolic panel     Other   Hyperlipidemia    Under good control on current regimen. Continue current regimen. Continue to monitor. Call with any concerns. Refills given. Labs drawn today.        Relevant Medications   hydrochlorothiazide (HYDRODIURIL) 25 MG tablet   carvedilol (COREG)  25 MG tablet   amLODipine (NORVASC) 10 MG tablet   losartan (COZAAR) 100 MG tablet   Other Relevant Orders   CBC with Differential/Platelet   Comprehensive metabolic panel   Lipid Panel w/o Chol/HDL Ratio   Acute anxiety    Under good control on current regimen. Continue current regimen. Continue to monitor. Call with any concerns. Refills given. 30 tabs of lorazepam given- should last for 6 months.        Relevant Medications   LORazepam (ATIVAN) 0.5 MG tablet   Other Relevant Orders   CBC with Differential/Platelet   Comprehensive metabolic panel   Iron (Fe) deficiency anemia    Under good control on current regimen. Continue current regimen. Continue to monitor. Call with any concerns. Refills given. Labs drawn today.         Other Visit Diagnoses    Essential hypertension       Relevant Medications   hydrochlorothiazide (HYDRODIURIL) 25 MG tablet   carvedilol (COREG) 25 MG tablet   amLODipine (NORVASC) 10 MG tablet   losartan (COZAAR) 100 MG tablet       Follow up plan: Return in about 6 months (around 10/15/2020).

## 2020-04-17 NOTE — Assessment & Plan Note (Signed)
Under good control on current regimen. Continue current regimen. Continue to monitor. Call with any concerns. Refills given. 30 tabs of lorazepam given- should last for 6 months.

## 2020-04-17 NOTE — Assessment & Plan Note (Signed)
Under good control on current regimen. Continue current regimen. Continue to monitor. Call with any concerns. Refills given.   

## 2020-04-18 LAB — CBC WITH DIFFERENTIAL/PLATELET
Basophils Absolute: 0 10*3/uL (ref 0.0–0.2)
Basos: 0 %
EOS (ABSOLUTE): 0.2 10*3/uL (ref 0.0–0.4)
Eos: 5 %
Hematocrit: 41.7 % (ref 37.5–51.0)
Hemoglobin: 14.2 g/dL (ref 13.0–17.7)
Immature Grans (Abs): 0 10*3/uL (ref 0.0–0.1)
Immature Granulocytes: 0 %
Lymphocytes Absolute: 0.6 10*3/uL — ABNORMAL LOW (ref 0.7–3.1)
Lymphs: 14 %
MCH: 32.6 pg (ref 26.6–33.0)
MCHC: 34.1 g/dL (ref 31.5–35.7)
MCV: 96 fL (ref 79–97)
Monocytes Absolute: 0.5 10*3/uL (ref 0.1–0.9)
Monocytes: 11 %
Neutrophils Absolute: 3.2 10*3/uL (ref 1.4–7.0)
Neutrophils: 70 %
Platelets: 230 10*3/uL (ref 150–450)
RBC: 4.35 x10E6/uL (ref 4.14–5.80)
RDW: 13.4 % (ref 11.6–15.4)
WBC: 4.6 10*3/uL (ref 3.4–10.8)

## 2020-04-18 LAB — LIPID PANEL W/O CHOL/HDL RATIO
Cholesterol, Total: 211 mg/dL — ABNORMAL HIGH (ref 100–199)
HDL: 61 mg/dL (ref >39)
LDL Chol Calc (NIH): 121 mg/dL — ABNORMAL HIGH (ref 0–99)
Triglycerides: 164 mg/dL — ABNORMAL HIGH (ref 0–149)
VLDL Cholesterol Cal: 29 mg/dL (ref 5–40)

## 2020-04-18 LAB — COMPREHENSIVE METABOLIC PANEL
ALT: 21 IU/L (ref 0–44)
AST: 20 IU/L (ref 0–40)
Albumin/Globulin Ratio: 1.6 (ref 1.2–2.2)
Albumin: 4.1 g/dL (ref 3.7–4.7)
Alkaline Phosphatase: 102 IU/L (ref 44–121)
BUN/Creatinine Ratio: 16 (ref 10–24)
BUN: 13 mg/dL (ref 8–27)
Bilirubin Total: 0.4 mg/dL (ref 0.0–1.2)
CO2: 22 mmol/L (ref 20–29)
Calcium: 9.3 mg/dL (ref 8.6–10.2)
Chloride: 103 mmol/L (ref 96–106)
Creatinine, Ser: 0.81 mg/dL (ref 0.76–1.27)
Globulin, Total: 2.6 g/dL (ref 1.5–4.5)
Glucose: 109 mg/dL — ABNORMAL HIGH (ref 65–99)
Potassium: 4.3 mmol/L (ref 3.5–5.2)
Sodium: 140 mmol/L (ref 134–144)
Total Protein: 6.7 g/dL (ref 6.0–8.5)
eGFR: 91 mL/min/{1.73_m2} (ref >59)

## 2020-05-09 DIAGNOSIS — I700 Atherosclerosis of aorta: Secondary | ICD-10-CM | POA: Insufficient documentation

## 2020-05-29 ENCOUNTER — Ambulatory Visit (INDEPENDENT_AMBULATORY_CARE_PROVIDER_SITE_OTHER): Payer: Medicare Other

## 2020-05-29 VITALS — Ht 70.0 in | Wt 215.0 lb

## 2020-05-29 DIAGNOSIS — Z0000 Encounter for general adult medical examination without abnormal findings: Secondary | ICD-10-CM

## 2020-05-29 NOTE — Progress Notes (Addendum)
I connected with Jonathan Burns today by telephone and verified that I am speaking with the correct person using two identifiers. Location patient: home Location provider: work Persons participating in the virtual visit: Reo, Portela LPN.   I discussed the limitations, risks, security and privacy concerns of performing an evaluation and management service by telephone and the availability of in person appointments. I also discussed with the patient that there may be a patient responsible charge related to this service. The patient expressed understanding and verbally consented to this telephonic visit.    Interactive audio and video telecommunications were attempted between this provider and patient, however failed, due to patient having technical difficulties OR patient did not have access to video capability.  We continued and completed visit with audio only.     Vital signs may be patient reported or missing.  Subjective:   Jonathan Burns is a 75 y.o. male who presents for Medicare Annual/Subsequent preventive examination.  Review of Systems     Cardiac Risk Factors include: advanced age (>40mn, >>59women);hypertension;male gender;obesity (BMI >30kg/m2)     Objective:    Today's Vitals   05/29/20 0811  Weight: 215 lb (97.5 kg)  Height: _0  (1.778 m)   Body mass index is 30.85 kg/m.  Advanced Directives 05/29/2020 09/15/2019 05/24/2019 01/20/2019 01/18/2019 01/13/2019 09/03/2018  Does Patient Have a Medical Advance Directive? Yes Yes Yes No - No No  Type of AParamedicof AShady Dale[place] will [place]if[place]Living will Living will;Healthcare Power of Attorney - - - -  Does patient want to make changes to medical advance directive? - No - Patient declined - - - - -  Copy of [place] Chart? No - copy requested No - copy requested Yes - validated most recent copy scanned in chart (See row information) - - - -  Would  patient like information on creating a medical advance directive? - - - No - Patient declined No - Patient declined - No - Patient declined    Current Medications (verified)  Allergies (verified) Patient has no known allergies.   History: Past Medical History:  Diagnosis Date  . Anemia   . BPH (benign prostatic hypertrophy)   . [place]   . Chronic GI bleeding   . [place]   . Hyperlipidemia   . Hypertension   . Prostate nodule   . PUD (peptic ulcer disease)   . Rising PSA level    Past Surgical History:  Procedure Laterality Date  . COLONOSCOPY  2015  . ESOPHAGOGASTRODUODENOSCOPY  2015  . FLEXIBLE BRONCHOSCOPY Left 01/20/2019   Procedure: FLEXIBLE BRONCHOSCOPY;  Surgeon: ONestor Lewandowsky MD;  Location: [facility] ORS;  Service: Thoracic;  Laterality: Left;  . THORACOTOMY Left 01/20/2019   Procedure: CONVERTED TO THORACOTOMY MAJOR;  Surgeon: ONestor Lewandowsky MD;  Location: [facility] ORS;  Service: Thoracic;  Laterality: Left;  .Marland KitchenVIDEO ASSISTED THORACOSCOPY (VATS)/THOROCOTOMY Left 01/20/2019   Procedure: VIDEO ASSISTED THORACOSCOPY (VATS)/THOROCOTOMY;  Surgeon: ONestor Lewandowsky MD;  Location: [facility] ORS;  Service: Thoracic;  Laterality: Left;   Family History  Problem Relation Age of Onset  . Cancer Mother        breast  . Diabetes Mother   . Aneurysm Father   . Kidney disease Neg Hx   . Prostate cancer Neg Hx   . Kidney cancer Neg Hx    Social History   Socioeconomic History  . Marital status: Married  Spouse name: Not on file  . Number of children: Not on file  . Years of education: Not on file  . Highest education level: Associate degree: academic program  Occupational History  . Not on file  Tobacco Use  . Smoking status: Former Smoker    Packs/day: 1.00    Years: 30.00    Pack years: 30.00    Types: Cigarettes    Quit date: 08/17/1986    Years since quitting: 33.8  . Smokeless tobacco: Never Used  Vaping Use  . Vaping Use:  Never used  Substance and Sexual Activity  . Alcohol use: Yes    Alcohol/week: 2.0 standard drinks    Types: 2 Standard drinks or equivalent per week    Comment: twice a week, mixes up between drinks   . Drug use: No  . Sexual activity: Not on file  Other Topics Concern  . Not on file  Social History Narrative   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    Involved in [place] activities    Social Determinants of Health   Financial Resource Strain: Low Risk   . Difficulty of Paying Living Expenses: Not hard at all  Food Insecurity: No Food Insecurity  . Worried About Charity fundraiser in the Last Year: Never true  . Ran Out of Food in the Last Year: Never true  Transportation Needs: No Transportation Needs  . Lack of Transportation (Medical): No  . Lack of Transportation (Non-Medical): No  Physical Activity: Inactive  . Days of Exercise per Week: 0 days  . Minutes of Exercise per Session: 0 min  Stress: No Stress Concern Present  . Feeling of Stress : Not at all  Social Connections: Not on file    Tobacco Counseling Counseling given: Not Answered   Clinical Intake:  Pre-visit preparation completed: Yes  Pain : No/denies pain     Nutritional Status: BMI > 30  Obese Nutritional Risks: None Diabetes: No  How often do you need to have someone help you when you read instructions, pamphlets, or other written materials from your doctor or pharmacy?: 1 - Never What is the last grade level you completed in school?: 68yr college  Diabetic? no  Interpreter Needed?: No  Information entered by :: NAllen LPN   Activities of Daily Living In your present state of health, do you have any difficulty performing the following activities: 05/29/2020  Hearing? N  Vision? N  Difficulty concentrating or making decisions? N  Walking or climbing stairs? N  Dressing or bathing? N  Doing errands, shopping? N  Preparing Food and eating ? N  Using the Toilet? N  In the past six months,  have you accidently leaked urine? N  Do you have problems with loss of bowel control? N  Managing your Medications? N  Managing your Finances? N  Housekeeping or managing your Housekeeping? N  Some recent data might be hidden    Patient Care Team: JValerie Roys DO as PCP - General (Family Medicine) BHollice Espy MD as Consulting Physician (Urology) RJosefine Class MD as Referring Physician (Gastroenterology) MLaneta Simmersas Physician Assistant (Urology) CNoreene Filbert MD as Referring Physician (Radiation Oncology)  Indicate any recent Medical Services you may have received from other than [facility] providers in the past year (date may be approximate).     Assessment:   This is a routine wellness examination for Jonathan Burns.  Hearing/Vision screen  Hearing Screening   _0  _1  _2  _3  _4  _5  _6   _0  _1   Right ear:           Left ear:           Vision Screening Comments: Regular eye exams, Dr. Matilde Sprang  Dietary issues and exercise activities discussed: Current Exercise Habits: The patient has a physically strenuous job, but has no regular exercise apart from work.  Goals    . DIET - INCREASE WATER INTAKE     Recommend drinking at least 6-8 glasses of water a day    . Patient Stated     05/29/2020, no goals      Depression Screen PHQ 2/9 Scores 05/29/2020 05/24/2019 08/25/2018 04/22/2018 09/17/2017 05/27/2017 04/14/2017  PHQ - 2 Score 0 0 0 0 0 0 0  PHQ- 9 Score - - 0 - - - 1    Fall Risk Fall Risk  05/29/2020 04/17/2020 03/26/2019 03/04/2019 03/02/2019  Falls in the past year? 0 0 0 0 0  Number falls in past yr: - 0 - - 0  Injury with Fall? - 0 - - 0  Risk for fall due to : Medication side effect No Fall Risks - - -  Follow up Education provided;Falls evaluation completed;Falls prevention discussed Falls evaluation completed - - -    FALL RISK PREVENTION PERTAINING TO THE HOME:  Any stairs in or around the home? Yes  If so, are there any  without handrails? No  Home free of loose throw rugs in walkways, pet beds, electrical cords, etc? Yes  Adequate lighting in your home to reduce risk of falls? Yes   ASSISTIVE DEVICES UTILIZED TO PREVENT FALLS:  Life alert? No  Use of a cane, walker or w/c? No  Grab bars in the bathroom? Yes  Shower chair or bench in shower? Yes  Elevated toilet seat or a handicapped toilet? Yes   TIMED UP AND GO:  Was the test performed? No .  Cognitive Function:     6CIT Screen 05/29/2020 05/24/2019 04/22/2018 04/14/2017  What Year? 0 points 0 points 0 points 0 points  What month? 0 points 0 points 0 points 0 points  What time? 0 points 0 points 0 points 0 points  Count back from 20 0 points 0 points 0 points 0 points  Months in reverse 2 points 0 points 0 points 0 points  Repeat phrase 0 points 0 points 0 points 0 points  Total Score 2 0 0 0    Immunizations Immunization History  Administered Date(s) Administered  . Influenza,inj,Quad PF,6+ Mos 11/22/2016  . Influenza-Unspecified 02/15/2014, 11/29/2014, 12/11/2015, 11/26/2017, 10/20/2018  . PFIZER(Purple Top)SARS-COV-2 Vaccination 03/18/2019, 04/08/2019, 10/13/2019  . Pneumococcal Conjugate-13 02/15/2014  . Pneumococcal Polysaccharide-23 12/19/2005, 09/20/2015  . Td 11/20/2004  . Tdap 09/20/2015  . Zoster 08/06/2011    TDAP status: Up to date  Flu Vaccine status: Up to date  Pneumococcal vaccine status: Up to date  Covid-19 vaccine status: Completed vaccines  Qualifies for Shingles Vaccine? Yes   Zostavax completed Yes   Shingrix Completed?: No.    Education has been provided regarding the importance of this vaccine. Patient has been advised to call insurance company to determine out of pocket expense if they have not yet received this vaccine. Advised may also receive vaccine at local pharmacy or Health Dept. Verbalized acceptance and understanding.  Screening Tests Health Maintenance  Topic Date Due  . COVID-19 Vaccine (4 -  Booster for Pfizer series) 04/14/2020  . INFLUENZA VACCINE  09/18/2020  . TETANUS/TDAP  09/19/2025  . Hepatitis  C Screening  Completed  . PNA vac Low Risk Adult  Completed  . HPV VACCINES  Aged Out    Health Maintenance  Health Maintenance Due  Topic Date Due  . COVID-19 Vaccine (4 - Booster for Pfizer series) 04/14/2020    Colorectal cancer screening: No longer required.   Lung Cancer Screening: (Low Dose CT Chest recommended if Age 88-80 years, 30 pack-year currently smoking OR have quit w/in 15years.) does not qualify.   Lung Cancer Screening Referral: no  Additional Screening:  Hepatitis C Screening: does qualify; Completed 02/23/2015  Vision Screening: Recommended annual ophthalmology exams for early detection of glaucoma and other disorders of the eye. Is the patient up to date with their annual eye exam?  Yes  Who is the provider or what is the name of the office in which the patient attends annual eye exams?  Dr. Matilde Sprang If pt is not established with a provider, would they like to be referred to a provider to establish care? No .   Dental Screening: Recommended annual dental exams for proper oral hygiene  Community Resource Referral / Chronic Care Management: CRR required this visit?  No   CCM required this visit?  No      Plan:     I have personally reviewed and noted the following in the patient's chart:   . Medical and social history . Use of alcohol, tobacco or illicit drugs  . Current medications and supplements . Functional ability and status . Nutritional status . Physical activity . Advanced directives . List of other physicians . Hospitalizations, surgeries, and ER visits in previous 12 months . Vitals . Screenings to include cognitive, depression, and falls . Referrals and appointments  In addition, I have reviewed and discussed with patient certain preventive protocols, quality metrics, and best practice recommendations. A written personalized  care plan for preventive services as well as general preventive health recommendations were provided to patient.     Kellie Simmering, LPN   1/[id number]/[id number]   Nurse Notes:

## 2020-05-29 NOTE — Patient Instructions (Signed)
Jonathan Burns , Thank you for taking time to come for your Medicare Wellness Visit. I appreciate your ongoing commitment to your health goals. Please review the following plan we discussed and let me know if I can assist you in the future.   Screening recommendations/referrals: Colonoscopy: not required Recommended yearly ophthalmology/optometry visit for glaucoma screening and checkup Recommended yearly dental visit for hygiene and checkup  Vaccinations: Influenza vaccine: completed 12/07/2019, due 09/18/2020 Pneumococcal vaccine: completed 02/15/2014 Tdap vaccine: 09/20/2015, due 09/19/2025 Shingles vaccine: discussed   Covid-19:  10/13/2019, 04/08/2019, 03/18/2019  Advanced directives: Please bring a copy of your POA (Power of Attorney) and/or Living Will to your next appointment.   Conditions/risks identified: none  Next appointment: Follow up in one year for your annual wellness visit.   Preventive Care 31 Years and Older, Male Preventive care refers to lifestyle choices and visits with your health care provider that can promote health and wellness. What does preventive care include?  A yearly physical exam. This is also called an annual well check.  Dental exams once or twice a year.  Routine eye exams. Ask your health care provider how often you should have your eyes checked.  Personal lifestyle choices, including:  Daily care of your teeth and gums.  Regular physical activity.  Eating a healthy diet.  Avoiding tobacco and drug use.  Limiting alcohol use.  Practicing safe sex.  Taking low doses of aspirin every day.  Taking vitamin and mineral supplements as recommended by your health care provider. What happens during an annual well check? The services and screenings done by your health care provider during your annual well check will depend on your age, overall health, lifestyle risk factors, and family history of disease. Counseling  Your health care provider may ask  you questions about your:  Alcohol use.  Tobacco use.  Drug use.  Emotional well-being.  Home and relationship well-being.  Sexual activity.  Eating habits.  History of falls.  Memory and ability to understand (cognition).  Work and work Statistician. Screening  You may have the following tests or measurements:  Height, weight, and BMI.  Blood pressure.  Lipid and cholesterol levels. These may be checked every 5 years, or more frequently if you are over 75 years old.  Skin check.  Lung cancer screening. You may have this screening every year starting at age 26 if you have a 30-pack-year history of smoking and currently smoke or have quit within the past 15 years.  Fecal occult blood test (FOBT) of the stool. You may have this test every year starting at age 51.  Flexible sigmoidoscopy or colonoscopy. You may have a sigmoidoscopy every 5 years or a colonoscopy every 10 years starting at age 33.  Prostate cancer screening. Recommendations will vary depending on your family history and other risks.  Hepatitis C blood test.  Hepatitis B blood test.  Sexually transmitted disease (STD) testing.  Diabetes screening. This is done by checking your blood sugar (glucose) after you have not eaten for a while (fasting). You may have this done every 1-3 years.  Abdominal aortic aneurysm (AAA) screening. You may need this if you are a current or former smoker.  Osteoporosis. You may be screened starting at age 52 if you are at high risk. Talk with your health care provider about your test results, treatment options, and if necessary, the need for more tests. Vaccines  Your health care provider may recommend certain vaccines, such as:  Influenza vaccine. This is  recommended every year.  Tetanus, diphtheria, and acellular pertussis (Tdap, Td) vaccine. You may need a Td booster every 10 years.  Zoster vaccine. You may need this after age 68.  Pneumococcal 13-valent conjugate  (PCV13) vaccine. One dose is recommended after age 63.  Pneumococcal polysaccharide (PPSV23) vaccine. One dose is recommended after age 9. Talk to your health care provider about which screenings and vaccines you need and how often you need them. This information is not intended to replace advice given to you by your health care provider. Make sure you discuss any questions you have with your health care provider. Document Released: 03/03/2015 Document Revised: 10/25/2015 Document Reviewed: 12/06/2014 Elsevier Interactive Patient Education  2017 [place] Prevention in the Home Falls can cause injuries. They can happen to people of all ages. There are many things you can do to make your home safe and to help prevent falls. What can I do on the outside of my home?  Regularly fix the edges of walkways and driveways and fix any cracks.  Remove anything that might make you trip as you walk through a door, such as a raised step or threshold.  Trim any bushes or trees on the path to your home.  Use bright outdoor lighting.  Clear any walking paths of anything that might make someone trip, such as rocks or tools.  Regularly check to see if handrails are loose or broken. Make sure that both sides of any steps have handrails.  Any raised decks and porches should have guardrails on the edges.  Have any leaves, snow, or ice cleared regularly.  Use sand or salt on walking paths during winter.  Clean up any spills in your garage right away. This includes oil or grease spills. What can I do in the bathroom?  Use night lights.  Install grab bars by the toilet and in the tub and shower. Do not use towel bars as grab bars.  Use non-skid mats or decals in the tub or shower.  If you need to sit down in the shower, use a plastic, non-slip stool.  Keep the floor dry. Clean up any water that spills on the floor as soon as it happens.  Remove soap buildup in the tub or shower  regularly.  Attach bath mats securely with double-sided non-slip rug tape.  Do not have throw rugs and other things on the floor that can make you trip. What can I do in the bedroom?  Use night lights.  Make sure that you have a light by your bed that is easy to reach.  Do not use any sheets or blankets that are too big for your bed. They should not hang down onto the floor.  Have a firm chair that has side arms. You can use this for support while you get dressed.  Do not have throw rugs and other things on the floor that can make you trip. What can I do in the kitchen?  Clean up any spills right away.  Avoid walking on wet floors.  Keep items that you use a lot in easy-to-reach places.  If you need to reach something above you, use a strong step stool that has a grab bar.  Keep electrical cords out of the way.  Do not use floor polish or wax that makes floors slippery. If you must use wax, use non-skid floor wax.  Do not have throw rugs and other things on the floor that can make you trip.  What can I do with my stairs?  Do not leave any items on the stairs.  Make sure that there are handrails on both sides of the stairs and use them. Fix handrails that are broken or loose. Make sure that handrails are as long as the stairways.  Check any carpeting to make sure that it is firmly attached to the stairs. Fix any carpet that is loose or worn.  Avoid having throw rugs at the top or bottom of the stairs. If you do have throw rugs, attach them to the floor with carpet tape.  Make sure that you have a light switch at the top of the stairs and the bottom of the stairs. If you do not have them, ask someone to add them for you. What else can I do to help prevent falls?  Wear shoes that:  Do not have high heels.  Have rubber bottoms.  Are comfortable and fit you well.  Are closed at the toe. Do not wear sandals.  If you use a stepladder:  Make sure that it is fully  opened. Do not climb a closed stepladder.  Make sure that both sides of the stepladder are locked into place.  Ask someone to hold it for you, if possible.  Clearly mark and make sure that you can see:  Any grab bars or handrails.  First and last steps.  Where the edge of each step is.  Use tools that help you move around (mobility aids) if they are needed. These include:  Canes.  Walkers.  Scooters.  Crutches.  Turn on the lights when you go into a dark area. Replace any light bulbs as soon as they burn out.  Set up your furniture so you have a clear path. Avoid moving your furniture around.  If any of your floors are uneven, fix them.  If there are any pets around you, be aware of where they are.  Review your medicines with your doctor. Some medicines can make you feel dizzy. This can increase your chance of falling. Ask your doctor what other things that you can do to help prevent falls. This information is not intended to replace advice given to you by your health care provider. Make sure you discuss any questions you have with your health care provider. Document Released: 12/01/2008 Document Revised: 07/13/2015 Document Reviewed: 03/11/2014 Elsevier Interactive Patient Education  2017 [employer].

## 2020-06-06 DIAGNOSIS — H33312 Horseshoe tear of retina without detachment, left eye: Secondary | ICD-10-CM | POA: Diagnosis not present

## 2020-06-06 DIAGNOSIS — B0050 Herpesviral ocular disease, unspecified: Secondary | ICD-10-CM | POA: Diagnosis not present

## 2020-06-06 DIAGNOSIS — H2513 Age-related nuclear cataract, bilateral: Secondary | ICD-10-CM | POA: Diagnosis not present

## 2020-06-06 DIAGNOSIS — H353131 Nonexudative age-related macular degeneration, bilateral, early dry stage: Secondary | ICD-10-CM | POA: Diagnosis not present

## 2020-06-06 DIAGNOSIS — H43813 Vitreous degeneration, bilateral: Secondary | ICD-10-CM | POA: Diagnosis not present

## 2020-07-28 ENCOUNTER — Other Ambulatory Visit: Payer: Self-pay | Admitting: *Deleted

## 2020-09-08 ENCOUNTER — Inpatient Hospital Stay: Payer: Medicare Other | Attending: Radiation Oncology

## 2020-09-08 DIAGNOSIS — C61 Malignant neoplasm of prostate: Secondary | ICD-10-CM | POA: Diagnosis not present

## 2020-09-08 LAB — PSA: Prostatic Specific Antigen: <0.01 ng/mL (ref 0.00–4.00)

## 2020-09-13 ENCOUNTER — Other Ambulatory Visit: Payer: Self-pay

## 2020-09-15 ENCOUNTER — Other Ambulatory Visit: Payer: Self-pay

## 2020-09-15 ENCOUNTER — Encounter: Payer: Self-pay | Admitting: Radiation Oncology

## 2020-09-15 ENCOUNTER — Ambulatory Visit
Admission: RE | Admit: 2020-09-15 | Discharge: 2020-09-15 | Disposition: A | Discharge status: Home / Self Care | Payer: Medicare Other | Source: Ambulatory Visit | Attending: Radiation Oncology | Admitting: Radiation Oncology

## 2020-09-15 VITALS — BP 121/59 | HR 59 | Temp 97.1°F | Wt 220.0 lb

## 2020-09-15 DIAGNOSIS — C61 Malignant neoplasm of prostate: Secondary | ICD-10-CM | POA: Diagnosis not present

## 2020-09-15 DIAGNOSIS — Z923 Personal history of irradiation: Secondary | ICD-10-CM | POA: Diagnosis not present

## 2020-09-15 DIAGNOSIS — R918 Other nonspecific abnormal finding of lung field: Secondary | ICD-10-CM | POA: Diagnosis not present

## 2020-09-15 NOTE — Progress Notes (Signed)
Radiation Oncology Follow up Note  Name: Jonathan Burns   Date:   09/15/2020 MRN:  [id number] DOB: Oct 01, 1945    This 75 y.o. male presents to the clinic today for 3-year follow-up status post IMRT radiation therapy for stage IIb Gleason 8 adenocarcinoma the prostate.  REFERRING PROVIDER: Valerie Roys, DO  HPI: Patient is a 75 year old male now about 3 years having completed IMRT radiation therapy to his prostate for Gleason 8 adenocarcinoma.  Seen today in routine follow-up he is doing well specifically denies any increased lower urinary tract symptoms diarrhea or fatigue.  His PSA is less than 0.01.Marland Kitchen  He is still on Eligard through urology last injection was back in January.  Patient had a CT scan back in February showing improved appearance of left hemithorax with significantly decreased fluid and persistent left-sided pleural thickening no evidence of disease suggestive of malignancy is noted.  COMPLICATIONS OF TREATMENT: none  FOLLOW UP COMPLIANCE: keeps appointments   PHYSICAL EXAM:  BP (!) 121/59   Pulse (!) 59   Temp (!) 97.1 F (36.2 C) (Tympanic)   Wt 220 lb (99.8 kg)   BMI 31.57 kg/m  Well-developed well-nourished patient in NAD. HEENT reveals PERLA, EOMI, discs not visualized.  Oral cavity is clear. No oral mucosal lesions are identified. Neck is clear without evidence of cervical or supraclavicular adenopathy. Lungs are clear to A&P. Cardiac examination is essentially unremarkable with regular rate and rhythm without murmur rub or thrill. Abdomen is benign with no organomegaly or masses noted. Motor sensory and DTR levels are equal and symmetric in the upper and lower extremities. Cranial nerves II through XII are grossly intact. Proprioception is intact. No peripheral adenopathy or edema is identified. No motor or sensory levels are noted. Crude visual fields are within normal range.  RADIOLOGY RESULTS: CT scan of the chest reviewed compatible with above-stated  findings  PLAN: Present time patient continues to do well excellent biochemical control of his prostate cancer.  And pleased with his overall progress.  Of asked to see him back in 1 year for follow-up.  He continues close follow-up care with urology.  Patient knows to call with any concerns.  I would like to take this opportunity to thank you for allowing me to participate in the care of your patient.[name], MD

## 2020-09-18 ENCOUNTER — Ambulatory Visit: Payer: Self-pay

## 2020-09-19 NOTE — Progress Notes (Signed)
05/06/2018 9:27 AM   Jonathan Burns Jonathan Burns January 08, 1946 [id number]  Referring provider: Valerie Roys, DO [place],  [place] [address]  Urological history 1.  Prostate cancer - underwent a biopsy for an elevated PSA of 6.0, and a right-sided prostate nodule. TRUS volume 33 cc.  Prostate biopsy revealed 12 of 12 cores positive including lesions up to Gleason 4+4 involving 80% of the tissue on the right lateral mid core 03/2017 - completed 07/2017 IM RT radiation therapy to both his prostate and pelvic nodes - currently on ADT - taking calcium and Vitamin D - PSA <0.01 ng/mL in 08/2020  2. LU TS - I PSS 5/1 - managed with tamsulosin 0.4 mg daily   3.  High risk hematuria  - former smoker - CT with contrast 12/2018  noted The adrenal glands are unremarkable. There is no hydronephrosis on either side. There is symmetric enhancement and excretion of contrast by both kidneys. Subcentimeter bilateral renal hypodense foci are too small to characterize. The visualized ureters appear unremarkable. The urinary bladder is collapsed. There is apparent diffuse thickening of the bladder wall which may be partly related to underdistention. Cystitis is not excluded. Correlation with urinalysis recommended.  Partially visualized loculated appearing moderate-sized left pleural effusion with findings concerning for empyema - for which he was hospitalized - Cysto 03/29/2019 with Dr. Bernardo Heater Mild lateral lobe enlargement prostate, mild hypervascularity prostate -no reports of gross heme - UA neg for micro heme  Chief Complaint  Patient presents with   Prostate Cancer   Benign Prostatic Hypertrophy     HPI: Jonathan Burns is a 75 y.o. male who presents today for 60-monthfollow-up.  He has no complaints at this visit as he just lost his wife 1 month ago.  Patient denies any modifying or aggravating factors.  Patient denies any gross hematuria, dysuria or suprapubic/flank pain.  Patient denies any fevers,  chills, nausea or vomiting.      IPSS     Row Name 09/20/20 0800         International Prostate Symptom Score   How often have you had the sensation of not emptying your bladder? Less than 1 in 5     How often have you had to urinate less than every two hours? Not at All     How often have you found you stopped and started again several times when you urinated? Not at All     How often have you found it difficult to postpone urination? Less than 1 in 5 times     How often have you had a weak urinary stream? Less than 1 in 5 times     How often have you had to strain to start urination? Not at All     How many times did you typically get up at night to urinate? 2 Times     Total IPSS Score 5           Quality of Life due to urinary symptoms     If you were to spend the rest of your life with your urinary condition just the way it is now how would you feel about that? Pleased             Score:  1-7 Mild 8-19 Moderate 20-35 Severe   PMH: Past Medical History:  Diagnosis Date   Anemia    BPH (benign prostatic hypertrophy)    Cancer (HCC)    Chronic GI bleeding  COPD (chronic obstructive pulmonary disease) (HCC)    Hyperlipidemia    Hypertension    Prostate nodule    PUD (peptic ulcer disease)    Rising PSA level     Surgical History: Past Surgical History:  Procedure Laterality Date   COLONOSCOPY  2015   ESOPHAGOGASTRODUODENOSCOPY  2015   FLEXIBLE BRONCHOSCOPY Left 01/20/2019   Procedure: FLEXIBLE BRONCHOSCOPY;  Surgeon: Nestor Lewandowsky, MD;  Location: [facility];  Service: Thoracic;  Laterality: Left;   THORACOTOMY Left 01/20/2019   Procedure: CONVERTED TO THORACOTOMY MAJOR;  Surgeon: Nestor Lewandowsky, MD;  Location: [facility];  Service: Thoracic;  Laterality: Left;   VIDEO ASSISTED THORACOSCOPY (VATS)/THOROCOTOMY Left 01/20/2019   Procedure: VIDEO ASSISTED THORACOSCOPY (VATS)/THOROCOTOMY;  Surgeon: Nestor Lewandowsky, MD;  Location: [facility];  Service: Thoracic;   Laterality: Left;    Home Medications:  Current Outpatient Medications on File Prior to Visit  Medication Sig Dispense Refill   amLODipine (NORVASC) 10 MG tablet Take 1 tablet (10 mg total) by mouth daily. 90 tablet 1   baclofen (LIORESAL) 10 MG tablet Take 1 tablet (10 mg total) by mouth at bedtime. 30 tablet 0   carvedilol (COREG) 25 MG tablet Take 1 tablet (25 mg total) by mouth 2 (two) times daily with a meal. 180 tablet 1   Ferrous Sulfate (IRON) 325 (65 Fe) MG TABS Take 1 tablet (325 mg total) by mouth daily. 90 tablet 3   hydrochlorothiazide (HYDRODIURIL) 25 MG tablet 1 tab daily 90 tablet 1   LORazepam (ATIVAN) 0.5 MG tablet Take 1 tablet (0.5 mg total) by mouth daily as needed for anxiety. 30 tablet 0   losartan (COZAAR) 100 MG tablet Take 1 tablet (100 mg total) by mouth daily. 90 tablet 1   Multiple Vitamin (MULTIVITAMIN) tablet Take 1 tablet by mouth daily.     Multiple Vitamins-Minerals (PRESERVISION AREDS PO) Take 1 tablet by mouth 2 (two) times daily.     Omega-3 1000 MG CAPS Take 1 capsule by mouth daily.     pantoprazole (PROTONIX) 40 MG tablet Take 1 tablet (40 mg total) by mouth daily. 90 tablet 1   saccharomyces boulardii (FLORASTOR) 250 MG capsule Take 1 capsule (250 mg total) by mouth 2 (two) times daily. 50 capsule 0   tamsulosin (FLOMAX) 0.4 MG CAPS capsule Take 1 capsule (0.4 mg total) by mouth daily. 90 capsule 3   valACYclovir (VALTREX) 500 MG tablet Take 500 mg by mouth daily.     No current facility-administered medications on file prior to visit.      Allergies: No Known Allergies  Family History: Family History  Problem Relation Age of Onset   Cancer Mother        breast   Diabetes Mother    Aneurysm Father    Kidney disease Neg Hx    Prostate cancer Neg Hx    Kidney cancer Neg Hx     Social History:  reports that he quit smoking about 34 years ago. His smoking use included cigarettes. He has a 30.00 pack-year smoking history. He has never used  smokeless tobacco. He reports current alcohol use of about 2.0 standard drinks of alcohol per week. He reports that he does not use drugs.  ROS: For pertinent review of systems please refer to history of present illness  Physical Exam: BP 125/74   Pulse 69   Ht _0  (1.753 m)   Wt 217 lb (98.4 kg)   BMI 32.05 kg/m   Constitutional:  Well nourished.  Alert and oriented, No acute distress. HEENT: [place] AT, mask in place  Trachea midline Cardiovascular: No clubbing, cyanosis, or edema. Respiratory: Normal respiratory effort, no increased work of breathing. Neurologic: Grossly intact, no focal deficits, moving all 4 extremities. Psychiatric: Normal mood and affect.   Laboratory Data: Component     Latest Ref Rng & Units 04/07/2015 03/25/2016 04/05/2016 04/10/2017  Prostate Specific Ag, Serum     0.0 - 4.0 ng/mL 2.2 3.4 3.1 6.0 (H)   Component     Latest Ref Rng & Units 05/06/2018 11/03/2018 03/09/2019 10/15/2019  Prostate Specific Ag, Serum     0.0 - 4.0 ng/mL <0.1 <0.1 <0.1 <0.1   Component     Latest Ref Rng & Units 03/15/2020  Prostate Specific Ag, Serum     0.0 - 4.0 ng/mL <0.1   Component     Latest Ref Rng & Units 12/15/2017 08/27/2018 09/10/2019 09/08/2020  Prostatic Specific Antigen     0.00 - 4.00 ng/mL 0.01 <0.01 <0.01 <0.01  I have reviewed the labs.  Assessment & Plan:    1. Prostate cancer - PSA undetectable - Status post IR MT radiation therapy to both his prostate and pelvic nodes for stage IIB Gleason 8 adenocarcinoma for the prostate - continue ADT - Eligard 45 mg given today  - RTC in 6 months for PSA and Eligard injection - end date 10/2020  2. BPH with LUTS -Continue tamsulosin 0.4 mg daily - refills given  3.  High risk hematuria - work up completed 03/2019 -hypervascular prostate - No reports of gross hematuria  - UA neg for micro heme     Return in about 6 months (around 03/23/2021) for IPSS and PSA .  Zara Council, PA-C  [facility] [address] [place], [place] [address] [phone]

## 2020-09-20 ENCOUNTER — Other Ambulatory Visit: Payer: Self-pay

## 2020-09-20 ENCOUNTER — Ambulatory Visit (INDEPENDENT_AMBULATORY_CARE_PROVIDER_SITE_OTHER): Payer: Medicare Other | Admitting: Urology

## 2020-09-20 ENCOUNTER — Encounter: Payer: Self-pay | Admitting: Urology

## 2020-09-20 VITALS — BP 125/74 | HR 69 | Ht 69.0 in | Wt 217.0 lb

## 2020-09-20 DIAGNOSIS — N401 Enlarged prostate with lower urinary tract symptoms: Secondary | ICD-10-CM

## 2020-09-20 DIAGNOSIS — R3129 Other microscopic hematuria: Secondary | ICD-10-CM | POA: Diagnosis not present

## 2020-09-20 DIAGNOSIS — N138 Other obstructive and reflux uropathy: Secondary | ICD-10-CM | POA: Diagnosis not present

## 2020-09-20 DIAGNOSIS — C61 Malignant neoplasm of prostate: Secondary | ICD-10-CM

## 2020-09-20 LAB — URINALYSIS, COMPLETE
Bilirubin, UA: NEGATIVE
Glucose, UA: NEGATIVE
Ketones, UA: NEGATIVE
Leukocytes,UA: NEGATIVE
Nitrite, UA: NEGATIVE
Protein,UA: NEGATIVE
Specific Gravity, UA: 1.02 (ref 1.005–1.030)
Urobilinogen, Ur: 0.2 mg/dL (ref 0.2–1.0)
pH, UA: 6.5 (ref 5.0–7.5)

## 2020-09-20 LAB — MICROSCOPIC EXAMINATION
Bacteria, UA: NONE SEEN
Epithelial Cells (non renal): NONE SEEN /hpf (ref 0–10)
WBC, UA: NONE SEEN /hpf (ref 0–5)

## 2020-09-20 MED ORDER — LEUPROLIDE ACETATE (6 MONTH) 45 MG ~~LOC~~ KIT
45.0000 mg | PACK | Freq: Once | SUBCUTANEOUS | Status: AC
Start: 2020-09-20 — End: 2020-09-20
  Administered 2020-09-20: 45 mg via SUBCUTANEOUS

## 2020-09-20 NOTE — Addendum Note (Signed)
Addended by: Evelina Bucy on: 09/20/2020 09:42 AM   Modules accepted: Orders

## 2020-09-20 NOTE — Progress Notes (Signed)
Eligard SubQ Injection   Due to Prostate Cancer patient is present today for a Eligard Injection.  Medication: Eligard 6 month Dose: 45 mg  Location: right upper outer buttocks Lot: [id number] Exp: 04/17/2022  Patient tolerated well, no complications were noted  Performed by: Bradly Bienenstock, CMA  Per Zara Council patient is to continue therapy for 6 months . Patient given reminder to continue on Vitamin D 800-1000iu and Calium 1000-1200mg  daily while on Androgen Deprivation Therapy.  PA approval dates: No PA required.

## 2020-10-16 ENCOUNTER — Other Ambulatory Visit: Payer: Self-pay

## 2020-10-16 ENCOUNTER — Encounter: Payer: Self-pay | Admitting: Family Medicine

## 2020-10-16 ENCOUNTER — Ambulatory Visit (INDEPENDENT_AMBULATORY_CARE_PROVIDER_SITE_OTHER): Payer: Medicare Other | Admitting: Family Medicine

## 2020-10-16 VITALS — BP 120/71 | HR 60 | Temp 98.3°F | Ht 67.9 in | Wt 219.6 lb

## 2020-10-16 DIAGNOSIS — K219 Gastro-esophageal reflux disease without esophagitis: Secondary | ICD-10-CM | POA: Diagnosis not present

## 2020-10-16 DIAGNOSIS — D508 Other iron deficiency anemias: Secondary | ICD-10-CM | POA: Diagnosis not present

## 2020-10-16 DIAGNOSIS — K922 Gastrointestinal hemorrhage, unspecified: Secondary | ICD-10-CM | POA: Diagnosis not present

## 2020-10-16 DIAGNOSIS — F419 Anxiety disorder, unspecified: Secondary | ICD-10-CM

## 2020-10-16 DIAGNOSIS — E785 Hyperlipidemia, unspecified: Secondary | ICD-10-CM

## 2020-10-16 DIAGNOSIS — J42 Unspecified chronic bronchitis: Secondary | ICD-10-CM

## 2020-10-16 DIAGNOSIS — I10 Essential (primary) hypertension: Secondary | ICD-10-CM

## 2020-10-16 DIAGNOSIS — I700 Atherosclerosis of aorta: Secondary | ICD-10-CM | POA: Diagnosis not present

## 2020-10-16 DIAGNOSIS — C61 Malignant neoplasm of prostate: Secondary | ICD-10-CM

## 2020-10-16 LAB — URINALYSIS, ROUTINE W REFLEX MICROSCOPIC
Bilirubin, UA: NEGATIVE
Glucose, UA: NEGATIVE
Ketones, UA: NEGATIVE
Leukocytes,UA: NEGATIVE
Nitrite, UA: NEGATIVE
Protein,UA: NEGATIVE
RBC, UA: NEGATIVE
Specific Gravity, UA: 1.015 (ref 1.005–1.030)
Urobilinogen, Ur: 0.2 mg/dL (ref 0.2–1.0)
pH, UA: 6 (ref 5.0–7.5)

## 2020-10-16 LAB — MICROALBUMIN, URINE WAIVED
Creatinine, Urine Waived: 200 mg/dL (ref 10–300)
Microalb, Ur Waived: 10 mg/L (ref 0–19)
Microalb/Creat Ratio: <30 mg/g (ref <30)

## 2020-10-16 MED ORDER — HYDROCHLOROTHIAZIDE 25 MG PO TABS: ORAL_TABLET | ORAL | 1 refills | Status: DC

## 2020-10-16 MED ORDER — LOSARTAN POTASSIUM 100 MG PO TABS: ORAL_TABLET | ORAL | 1 refills | Status: DC

## 2020-10-16 MED ORDER — BACLOFEN 10 MG PO TABS: 10.0000 mg | ORAL_TABLET | Freq: Every day | ORAL | 0 refills | Status: DC

## 2020-10-16 MED ORDER — LORAZEPAM 0.5 MG PO TABS: 0.5000 mg | ORAL_TABLET | Freq: Every day | ORAL | 0 refills | Status: DC | PRN

## 2020-10-16 MED ORDER — IRON 325 (65 FE) MG PO TABS: 1.0000 | ORAL_TABLET | Freq: Every day | ORAL | 3 refills | Status: DC

## 2020-10-16 MED ORDER — PANTOPRAZOLE SODIUM 40 MG PO TBEC: 40.0000 mg | DELAYED_RELEASE_TABLET | Freq: Every day | ORAL | 1 refills | Status: DC

## 2020-10-16 MED ORDER — CARVEDILOL 25 MG PO TABS: 25.0000 mg | ORAL_TABLET | Freq: Two times a day (BID) | ORAL | 1 refills | Status: DC

## 2020-10-16 MED ORDER — AMLODIPINE BESYLATE 10 MG PO TABS: 10.0000 mg | ORAL_TABLET | Freq: Every day | ORAL | 1 refills | Status: DC

## 2020-10-16 NOTE — Assessment & Plan Note (Signed)
Under good control on current regimen. Continue current regimen. Continue to monitor. Call with any concerns. Refills given. Labs drawn today. Rx for lorazepam should last 6 months.

## 2020-10-16 NOTE — Assessment & Plan Note (Signed)
Rechecking labs today. Await results. Continue protonix. Call with any concerns.

## 2020-10-16 NOTE — Assessment & Plan Note (Signed)
Rechecking labs today. Await results.  

## 2020-10-16 NOTE — Assessment & Plan Note (Signed)
Rechecking labs today. Await results. Treat as needed.  °

## 2020-10-16 NOTE — Progress Notes (Signed)
BP 120/71   Pulse 60   Temp 98.3 F (36.8 C) (Oral)   Ht 5' 7.9" (1.725 m)   Wt 219 lb 9.6 oz (99.6 kg)   SpO2 97%   BMI 33.49 kg/m    Subjective:    Patient ID: Jonathan Burns, male    DOB: 07-12-1945, 75 y.o.   MRN: [id number]  HPI: Jonathan Burns is a 75 y.o. male  Chief Complaint  Patient presents with   Anxiety   Hyperlipidemia   Hypertension   HYPERTENSION / HYPERLIPIDEMIA Satisfied with current treatment? no Duration of hypertension: chronic BP monitoring frequency: not checking BP medication side effects: no Past BP meds: carvedilol, amlodipine, HCTZ, losartan Duration of hyperlipidemia: chronic Cholesterol medication side effects: not on anything Cholesterol supplements: fish oil Past cholesterol medications: none Medication compliance: excellent compliance Aspirin: no Recent stressors: no Recurrent headaches: no Visual changes: no Palpitations: no Dyspnea: no Chest pain: no Lower extremity edema: no Dizzy/lightheaded: no  ANXIETY/STRESS Duration: chronic Status:stable Anxious mood: no  Excessive worrying: no Irritability: no  Sweating: no Nausea: no Palpitations:no Hyperventilation: no Panic attacks: no Agoraphobia: no  Obscessions/compulsions: no Depressed mood: no Depression screen [facility] 2/9 10/16/2020 05/29/2020 05/24/2019 08/25/2018 04/22/2018  Decreased Interest 0 0 0 0 0  Down, Depressed, Hopeless 0 0 0 0 0  PHQ - 2 Score 0 0 0 0 0  Altered sleeping 0 - - 0 -  Tired, decreased energy 0 - - 0 -  Change in appetite 0 - - 0 -  Feeling bad or failure about yourself  0 - - 0 -  Trouble concentrating 0 - - 0 -  Moving slowly or fidgety/restless 0 - - 0 -  Suicidal thoughts 0 - - 0 -  PHQ-9 Score 0 - - 0 -  Difficult doing work/chores Not difficult at all - - Not difficult at all -   Anhedonia: no Weight changes: no Insomnia: no   Hypersomnia: no Fatigue/loss of energy: no Feelings of worthlessness: no Feelings of guilt: no Impaired  concentration/indecisiveness: no Suicidal ideations: no  Crying spells: no Recent Stressors/Life Changes: yes   Relationship problems: no   Family stress: no     Financial stress: no    Job stress: no    Recent death/loss: yes   Relevant past medical, surgical, family and social history reviewed and updated as indicated. Interim medical history since our last visit reviewed. Allergies and medications reviewed and updated.  Review of Systems  Constitutional: Negative.   Respiratory: Negative.    Cardiovascular: Negative.   Gastrointestinal: Negative.   Musculoskeletal: Negative.   Neurological: Negative.   Psychiatric/Behavioral: Negative.     Per HPI unless specifically indicated above     Objective:    BP 120/71   Pulse 60   Temp 98.3 F (36.8 C) (Oral)   Ht 5' 7.9" (1.725 m)   Wt 219 lb 9.6 oz (99.6 kg)   SpO2 97%   BMI 33.49 kg/m   Wt Readings from Last 3 Encounters:  10/16/20 219 lb 9.6 oz (99.6 kg)  09/20/20 217 lb (98.4 kg)  09/15/20 220 lb (99.8 kg)    Physical Exam Vitals and nursing note reviewed.  Constitutional:      General: He is not in acute distress.    Appearance: Normal appearance. He is not ill-appearing, toxic-appearing or diaphoretic.  HENT:     Head: Normocephalic and atraumatic.     Right Ear: External ear normal.  Left Ear: External ear normal.     Nose: Nose normal.     Mouth/Throat:     Mouth: Mucous membranes are moist.     Pharynx: Oropharynx is clear.  Eyes:     General: No scleral icterus.       Right eye: No discharge.        Left eye: No discharge.     Extraocular Movements: Extraocular movements intact.     Conjunctiva/sclera: Conjunctivae normal.     Pupils: Pupils are equal, round, and reactive to light.  Cardiovascular:     Rate and Rhythm: Normal rate and regular rhythm.     Pulses: Normal pulses.     Heart sounds: Normal heart sounds. No murmur heard.   No friction rub. No gallop.  Pulmonary:     Effort:  Pulmonary effort is normal. No respiratory distress.     Breath sounds: Normal breath sounds. No stridor. No wheezing, rhonchi or rales.  Chest:     Chest wall: No tenderness.  Musculoskeletal:        General: Normal range of motion.     Cervical back: Normal range of motion and neck supple.  Skin:    General: Skin is warm and dry.     Capillary Refill: Capillary refill takes less than 2 seconds.     Coloration: Skin is not jaundiced or pale.     Findings: No bruising, erythema, lesion or rash.  Neurological:     General: No focal deficit present.     Mental Status: He is alert and oriented to person, place, and time. Mental status is at baseline.  Psychiatric:        Mood and Affect: Mood normal.        Behavior: Behavior normal.        Thought Content: Thought content normal.        Judgment: Judgment normal.    Results for orders placed or performed in visit on 09/20/20  Microscopic Examination   Urine  Result Value Ref Range   WBC, UA None seen 0 - 5 /hpf   RBC 0-2 0 - 2 /hpf   Epithelial Cells (non renal) None seen 0 - 10 /hpf   Bacteria, UA None seen None seen/Few  Urinalysis, Complete  Result Value Ref Range   Specific Gravity, UA 1.020 1.005 - 1.030   pH, UA 6.5 5.0 - 7.5   Color, UA Yellow Yellow   Appearance Ur Clear Clear   Leukocytes,UA Negative Negative   Protein,UA Negative Negative/Trace   Glucose, UA Negative Negative   Ketones, UA Negative Negative   RBC, UA Trace (A) Negative   Bilirubin, UA Negative Negative   Urobilinogen, Ur 0.2 0.2 - 1.0 mg/dL   Nitrite, UA Negative Negative   Microscopic Examination See below:       Assessment & Plan:   Problem List Items Addressed This Visit       Cardiovascular and Mediastinum   HTN (hypertension), benign - Primary    Under good control on current regimen. Continue current regimen. Continue to monitor. Call with any concerns. Refills given. Labs drawn today.        Relevant Medications   losartan  (COZAAR) 100 MG tablet   hydrochlorothiazide (HYDRODIURIL) 25 MG tablet   carvedilol (COREG) 25 MG tablet   amLODipine (NORVASC) 10 MG tablet   Other Relevant Orders   CBC with Differential/Platelet   Comprehensive metabolic panel   Microalbumin, Urine Waived   TSH  Aortic atherosclerosis (HCC)    Declined statin. Rechecking labs today. Await results.       Relevant Medications   losartan (COZAAR) 100 MG tablet   hydrochlorothiazide (HYDRODIURIL) 25 MG tablet   carvedilol (COREG) 25 MG tablet   amLODipine (NORVASC) 10 MG tablet   Other Relevant Orders   CBC with Differential/Platelet   Comprehensive metabolic panel     Respiratory   COPD (chronic obstructive pulmonary disease) (HCC)    Under good control not on any medicine. Continue to monitor. Call with any concerns.        Relevant Orders   CBC with Differential/Platelet   Comprehensive metabolic panel     Digestive   Chronic GI bleeding    Rechecking labs today. Await results. Continue protonix. Call with any concerns.       Relevant Medications   pantoprazole (PROTONIX) 40 MG tablet   GERD (gastroesophageal reflux disease)    Under good control on current regimen. Continue current regimen. Continue to monitor. Call with any concerns. Refills given. Labs drawn today.        Relevant Medications   pantoprazole (PROTONIX) 40 MG tablet   Other Relevant Orders   CBC with Differential/Platelet   Comprehensive metabolic panel     Genitourinary   Prostate cancer ([place])    Stable. Continue to follow with urology. Call with any concerns.       Relevant Medications   LORazepam (ATIVAN) 0.5 MG tablet   Other Relevant Orders   CBC with Differential/Platelet   Comprehensive metabolic panel   PSA   Urinalysis, Routine w reflex microscopic     Other   Hyperlipidemia    Rechecking labs today. Await results.        Relevant Medications   losartan (COZAAR) 100 MG tablet   hydrochlorothiazide (HYDRODIURIL) 25  MG tablet   carvedilol (COREG) 25 MG tablet   amLODipine (NORVASC) 10 MG tablet   Other Relevant Orders   CBC with Differential/Platelet   Comprehensive metabolic panel   Lipid Panel w/o Chol/HDL Ratio   Acute anxiety    Under good control on current regimen. Continue current regimen. Continue to monitor. Call with any concerns. Refills given. Labs drawn today. Rx for lorazepam should last 6 months.        Relevant Medications   LORazepam (ATIVAN) 0.5 MG tablet   Iron (Fe) deficiency anemia    Rechecking labs today. Await results. Treat as needed.       Relevant Medications   Ferrous Sulfate (IRON) 325 (65 Fe) MG TABS   Other Relevant Orders   CBC with Differential/Platelet   Comprehensive metabolic panel   Iron and TIBC   Ferritin   Other Visit Diagnoses     Essential hypertension       Relevant Medications   losartan (COZAAR) 100 MG tablet   hydrochlorothiazide (HYDRODIURIL) 25 MG tablet   carvedilol (COREG) 25 MG tablet   amLODipine (NORVASC) 10 MG tablet        Follow up plan: Return in about 6 months (around 04/17/2021).

## 2020-10-16 NOTE — Assessment & Plan Note (Signed)
Declined statin. Rechecking labs today. Await results.

## 2020-10-16 NOTE — Assessment & Plan Note (Signed)
Stable. Continue to follow with urology. Call with any concerns.

## 2020-10-16 NOTE — Assessment & Plan Note (Signed)
Under good control on current regimen. Continue current regimen. Continue to monitor. Call with any concerns. Refills given. Labs drawn today.   

## 2020-10-16 NOTE — Assessment & Plan Note (Signed)
Under good control not on any medicine. Continue to monitor. Call with any concerns.

## 2020-10-17 LAB — COMPREHENSIVE METABOLIC PANEL
ALT: 19 IU/L (ref 0–44)
AST: 17 IU/L (ref 0–40)
Albumin/Globulin Ratio: 2.1 (ref 1.2–2.2)
Albumin: 4.5 g/dL (ref 3.7–4.7)
Alkaline Phosphatase: 77 IU/L (ref 44–121)
BUN/Creatinine Ratio: 19 (ref 10–24)
BUN: 17 mg/dL (ref 8–27)
Bilirubin Total: 0.3 mg/dL (ref 0.0–1.2)
CO2: 23 mmol/L (ref 20–29)
Calcium: 9.2 mg/dL (ref 8.6–10.2)
Chloride: 99 mmol/L (ref 96–106)
Creatinine, Ser: 0.88 mg/dL (ref 0.76–1.27)
Globulin, Total: 2.1 g/dL (ref 1.5–4.5)
Glucose: 107 mg/dL — ABNORMAL HIGH (ref 65–99)
Potassium: 4.1 mmol/L (ref 3.5–5.2)
Sodium: 136 mmol/L (ref 134–144)
Total Protein: 6.6 g/dL (ref 6.0–8.5)
eGFR: 89 mL/min/{1.73_m2} (ref >59)

## 2020-10-17 LAB — IRON AND TIBC
Iron Saturation: 39 % (ref 15–55)
Iron: 110 ug/dL (ref 38–169)
Total Iron Binding Capacity: 284 ug/dL (ref 250–450)
UIBC: 174 ug/dL (ref 111–343)

## 2020-10-17 LAB — CBC WITH DIFFERENTIAL/PLATELET
Basophils Absolute: 0 10*3/uL (ref 0.0–0.2)
Basos: 1 %
EOS (ABSOLUTE): 0.2 10*3/uL (ref 0.0–0.4)
Eos: 4 %
Hematocrit: 42.3 % (ref 37.5–51.0)
Hemoglobin: 14.1 g/dL (ref 13.0–17.7)
Immature Grans (Abs): 0 10*3/uL (ref 0.0–0.1)
Immature Granulocytes: 0 %
Lymphocytes Absolute: 0.7 10*3/uL (ref 0.7–3.1)
Lymphs: 13 %
MCH: 32.5 pg (ref 26.6–33.0)
MCHC: 33.3 g/dL (ref 31.5–35.7)
MCV: 98 fL — ABNORMAL HIGH (ref 79–97)
Monocytes Absolute: 0.7 10*3/uL (ref 0.1–0.9)
Monocytes: 12 %
Neutrophils Absolute: 3.9 10*3/uL (ref 1.4–7.0)
Neutrophils: 70 %
Platelets: 203 10*3/uL (ref 150–450)
RBC: 4.34 x10E6/uL (ref 4.14–5.80)
RDW: 13.4 % (ref 11.6–15.4)
WBC: 5.6 10*3/uL (ref 3.4–10.8)

## 2020-10-17 LAB — LIPID PANEL W/O CHOL/HDL RATIO
Cholesterol, Total: 209 mg/dL — ABNORMAL HIGH (ref 100–199)
HDL: 60 mg/dL (ref >39)
LDL Chol Calc (NIH): 122 mg/dL — ABNORMAL HIGH (ref 0–99)
Triglycerides: 153 mg/dL — ABNORMAL HIGH (ref 0–149)
VLDL Cholesterol Cal: 27 mg/dL (ref 5–40)

## 2020-10-17 LAB — FERRITIN: Ferritin: 87 ng/mL (ref 30–400)

## 2020-10-17 LAB — TSH: TSH: 3.44 u[IU]/mL (ref 0.450–4.500)

## 2020-10-17 LAB — PSA: Prostate Specific Ag, Serum: <0.1 ng/mL (ref 0.0–4.0)

## 2020-11-28 DIAGNOSIS — H353131 Nonexudative age-related macular degeneration, bilateral, early dry stage: Secondary | ICD-10-CM | POA: Diagnosis not present

## 2020-11-28 DIAGNOSIS — H2513 Age-related nuclear cataract, bilateral: Secondary | ICD-10-CM | POA: Diagnosis not present

## 2020-11-28 DIAGNOSIS — H33312 Horseshoe tear of retina without detachment, left eye: Secondary | ICD-10-CM | POA: Diagnosis not present

## 2020-11-28 DIAGNOSIS — B0050 Herpesviral ocular disease, unspecified: Secondary | ICD-10-CM | POA: Diagnosis not present

## 2020-11-28 DIAGNOSIS — H43813 Vitreous degeneration, bilateral: Secondary | ICD-10-CM | POA: Diagnosis not present

## 2020-12-06 DIAGNOSIS — Z23 Encounter for immunization: Secondary | ICD-10-CM | POA: Diagnosis not present

## 2020-12-25 ENCOUNTER — Other Ambulatory Visit: Payer: Self-pay

## 2020-12-25 ENCOUNTER — Ambulatory Visit (INDEPENDENT_AMBULATORY_CARE_PROVIDER_SITE_OTHER): Payer: Medicare Other | Admitting: Internal Medicine

## 2020-12-25 VITALS — BP 122/63 | HR 49 | Resp 18 | Ht 70.0 in | Wt 215.0 lb

## 2020-12-25 DIAGNOSIS — Z9989 Dependence on other enabling machines and devices: Secondary | ICD-10-CM | POA: Diagnosis not present

## 2020-12-25 DIAGNOSIS — I10 Essential (primary) hypertension: Secondary | ICD-10-CM | POA: Diagnosis not present

## 2020-12-25 DIAGNOSIS — J42 Unspecified chronic bronchitis: Secondary | ICD-10-CM | POA: Diagnosis not present

## 2020-12-25 DIAGNOSIS — Z7189 Other specified counseling: Secondary | ICD-10-CM

## 2020-12-25 DIAGNOSIS — G4733 Obstructive sleep apnea (adult) (pediatric): Secondary | ICD-10-CM | POA: Diagnosis not present

## 2020-12-25 NOTE — Patient Instructions (Signed)

## 2020-12-25 NOTE — Progress Notes (Signed)
[facility] [place], [place] [address]  Pulmonary Sleep Medicine   Office Visit Note  Patient Name: Jonathan Burns DOB: [birth year] MRN [id number]    Chief Complaint: Obstructive Sleep Apnea visit  Brief History:  Jonathan Burns is seen today for 1 year follow up The patient has a 6 years history of sleep apnea. Patient is using PAP nightly @ 12 - 17 cmH2O.  The patient feels good after sleeping with PAP.  The patient reports benefitting from PAP use. Reported sleepiness is  resolved and the Epworth Sleepiness Score is 1 out of 24. The patient does not take naps. The patient complains of the following: no problems  The compliance download shows  compliance with an average use time of 7:08 hours @ 99%. The AHI is 2.1  The patient does complain of limb movements disrupting sleep but it is controlled with iron supplements.   ROS  General: (-) fever, (-) chills, (-) night sweat Nose and Sinuses: (-) nasal stuffiness or itchiness, (-) postnasal drip, (-) nosebleeds, (-) sinus trouble. Mouth and Throat: (-) sore throat, (-) hoarseness. Neck: (-) swollen glands, (-) enlarged thyroid, (-) neck pain. Respiratory: - cough, - shortness of breath, - wheezing. Neurologic: - numbness, - tingling. Psychiatric: + anxiety, - depression   Current Medication: Outpatient Encounter Medications as of 12/25/2020  Medication Sig   amLODipine (NORVASC) 10 MG tablet Take 1 tablet (10 mg total) by mouth daily.   baclofen (LIORESAL) 10 MG tablet Take 1 tablet (10 mg total) by mouth at bedtime.   carvedilol (COREG) 25 MG tablet Take 1 tablet (25 mg total) by mouth 2 (two) times daily with a meal.   Ferrous Sulfate (IRON) 325 (65 Fe) MG TABS Take 1 tablet (325 mg total) by mouth daily.   hydrochlorothiazide (HYDRODIURIL) 25 MG tablet 1 tab daily   LORazepam (ATIVAN) 0.5 MG tablet Take 1 tablet (0.5 mg total) by mouth daily as needed for anxiety.   losartan (COZAAR) 100 MG tablet Take 1 tablet  (100 mg total) by mouth daily.   Multiple Vitamin (MULTIVITAMIN) tablet Take 1 tablet by mouth daily.   Multiple Vitamins-Minerals (PRESERVISION AREDS PO) Take 1 tablet by mouth 2 (two) times daily.   Omega-3 1000 MG CAPS Take 1 capsule by mouth daily.   pantoprazole (PROTONIX) 40 MG tablet Take 1 tablet (40 mg total) by mouth daily.   saccharomyces boulardii (FLORASTOR) 250 MG capsule Take 1 capsule (250 mg total) by mouth 2 (two) times daily.   tamsulosin (FLOMAX) 0.4 MG CAPS capsule Take 1 capsule (0.4 mg total) by mouth daily.   valACYclovir (VALTREX) 500 MG tablet Take 500 mg by mouth daily.   No facility-administered encounter medications on file as of 12/25/2020.    Surgical History: Past Surgical History:  Procedure Laterality Date   COLONOSCOPY  2015   ESOPHAGOGASTRODUODENOSCOPY  2015   FLEXIBLE BRONCHOSCOPY Left 01/20/2019   Procedure: FLEXIBLE BRONCHOSCOPY;  Surgeon: Nestor Lewandowsky, MD;  Location: [facility] ORS;  Service: Thoracic;  Laterality: Left;   THORACOTOMY Left 01/20/2019   Procedure: CONVERTED TO THORACOTOMY MAJOR;  Surgeon: Nestor Lewandowsky, MD;  Location: [facility] ORS;  Service: Thoracic;  Laterality: Left;   VIDEO ASSISTED THORACOSCOPY (VATS)/THOROCOTOMY Left 01/20/2019   Procedure: VIDEO ASSISTED THORACOSCOPY (VATS)/THOROCOTOMY;  Surgeon: Nestor Lewandowsky, MD;  Location: [facility] ORS;  Service: Thoracic;  Laterality: Left;    Medical History: Past Medical History:  Diagnosis Date   Anemia    BPH (benign prostatic hypertrophy)  Cancer (HCC)    Chronic GI bleeding    COPD (chronic obstructive pulmonary disease) (HCC)    Hyperlipidemia    Hypertension    Prostate nodule    PUD (peptic ulcer disease)    Rising PSA level     Family History: Non contributory to the present illness  Social History: Social History   Socioeconomic History   Marital status: Married    Spouse name: Not on file   Number of children: Not on file   Years of education: Not on file   Highest  education level: Associate degree: academic program  Occupational History   Not on file  Tobacco Use   Smoking status: Former    Packs/day: 1.00    Years: 30.00    Pack years: 30.00    Types: Cigarettes    Quit date: 08/17/1986    Years since quitting: 34.3   Smokeless tobacco: Never  Vaping Use   Vaping Use: Never used  Substance and Sexual Activity   Alcohol use: Yes    Alcohol/week: 2.0 standard drinks    Types: 2 Standard drinks or equivalent per week    Comment: twice a week, mixes up between drinks    Drug use: No   Sexual activity: Not on file  Other Topics Concern   Not on file  Social History Narrative   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    Involved in [place] activities    Social Determinants of Radio broadcast assistant Strain: Low Risk    Difficulty of Paying Living Expenses: Not hard at all  Food Insecurity: No Food Insecurity   Worried About Charity fundraiser in the Last Year: Never true   Arboriculturist in the Last Year: Never true  Transportation Needs: No Transportation Needs   Lack of Transportation (Medical): No   Lack of Transportation (Non-Medical): No  Physical Activity: Inactive   Days of Exercise per Week: 0 days   Minutes of Exercise per Session: 0 min  Stress: No Stress Concern Present   Feeling of Stress : Not at all  Social Connections: Not on file  Intimate Partner Violence: Not on file    Vital Signs: There were no vitals taken for this visit.  Examination: General Appearance: The patient is well-developed, well-nourished, and in no distress. Neck Circumference: 42 Skin: Gross inspection of skin unremarkable. Head: normocephalic, no gross deformities. Eyes: no gross deformities noted. ENT: ears appear grossly normal Neurologic: Alert and oriented. No involuntary movements.    EPWORTH SLEEPINESS SCALE:  Scale:  (0)= no chance of dozing; (1)= slight chance of dozing; (2)= moderate chance of dozing; (3)= high chance of  dozing  Chance  Situtation    Sitting and reading: 0    Watching TV: 0    Sitting Inactive in public: 0    As a passenger in car: 1      Lying down to rest: 0    Sitting and talking: 0    Sitting quielty after lunch: 0    In a car, stopped in traffic: 0   TOTAL SCORE:   1  out of 24    SLEEP STUDIES:  PSG 08/31/2014 - AHI 8,  SpO2 min @ 87%,    CPAP COMPLIANCE DATA:  Date Range: 12/26/19 - 12/24/20   Average Daily Use: 7:08 hours  Median Use: 7:13  Compliance for > 4 Hours: 99% days  AHI: 2.1 respiratory events per hour  Days Used: 364/365  Mask  Leak: 1.4 lpm  95th Percentile Pressure: 16.2 lpm cmH2O         LABS: Recent Results (from the past 2160 hour(s))  Microalbumin, Urine Waived     Status: None   Collection Time: 10/16/20  8:20 AM  Result Value Ref Range   Microalb, Ur Waived 10 0 - 19 mg/L   Creatinine, Urine Waived 200 10 - 300 mg/dL   Microalb/Creat Ratio <30 <30 mg/g    Comment:                              Abnormal:       30 - 300                         High Abnormal:           >300   Urinalysis, Routine w reflex microscopic     Status: None   Collection Time: 10/16/20  8:20 AM  Result Value Ref Range   Specific Gravity, UA 1.015 1.005 - 1.030   pH, UA 6.0 5.0 - 7.5   Color, UA Yellow Yellow   Appearance Ur Clear Clear   Leukocytes,UA Negative Negative   Protein,UA Negative Negative/Trace   Glucose, UA Negative Negative   Ketones, UA Negative Negative   RBC, UA Negative Negative   Bilirubin, UA Negative Negative   Urobilinogen, Ur 0.2 0.2 - 1.0 mg/dL   Nitrite, UA Negative Negative  CBC with Differential/Platelet     Status: Abnormal   Collection Time: 10/16/20  8:24 AM  Result Value Ref Range   WBC 5.6 3.4 - 10.8 x10E3/uL   RBC 4.34 4.14 - 5.80 x10E6/uL   Hemoglobin 14.1 13.0 - 17.7 g/dL   Hematocrit 42.3 37.5 - 51.0 %   MCV 98 (H) 79 - 97 fL   MCH 32.5 26.6 - 33.0 pg   MCHC 33.3 31.5 - 35.7 g/dL   RDW 13.4 11.6 -  15.4 %   Platelets 203 150 - 450 x10E3/uL   Neutrophils 70 Not Estab. %   Lymphs 13 Not Estab. %   Monocytes 12 Not Estab. %   Eos 4 Not Estab. %   Basos 1 Not Estab. %   Neutrophils Absolute 3.9 1.4 - 7.0 x10E3/uL   Lymphocytes Absolute 0.7 0.7 - 3.1 x10E3/uL   Monocytes Absolute 0.7 0.1 - 0.9 x10E3/uL   EOS (ABSOLUTE) 0.2 0.0 - 0.4 x10E3/uL   Basophils Absolute 0.0 0.0 - 0.2 x10E3/uL   Immature Granulocytes 0 Not Estab. %   Immature Grans (Abs) 0.0 0.0 - 0.1 x10E3/uL  Comprehensive metabolic panel     Status: Abnormal   Collection Time: 10/16/20  8:24 AM  Result Value Ref Range   Glucose 107 (H) 65 - 99 mg/dL   BUN 17 8 - 27 mg/dL   Creatinine, Ser 0.88 0.76 - 1.27 mg/dL   eGFR 89 >59 mL/min/1.73   BUN/Creatinine Ratio 19 10 - 24   Sodium 136 134 - 144 mmol/L   Potassium 4.1 3.5 - 5.2 mmol/L   Chloride 99 96 - 106 mmol/L   CO2 23 20 - 29 mmol/L   Calcium 9.2 8.6 - 10.2 mg/dL   Total Protein 6.6 6.0 - 8.5 g/dL   Albumin 4.5 3.7 - 4.7 g/dL   Globulin, Total 2.1 1.5 - 4.5 g/dL   Albumin/Globulin Ratio 2.1 1.2 - 2.2   Bilirubin Total 0.3 0.0 - 1.2  mg/dL   Alkaline Phosphatase 77 44 - 121 IU/L   AST 17 0 - 40 IU/L   ALT 19 0 - 44 IU/L  Lipid Panel w/o Chol/HDL Ratio     Status: Abnormal   Collection Time: 10/16/20  8:24 AM  Result Value Ref Range   Cholesterol, Total 209 (H) 100 - 199 mg/dL   Triglycerides 153 (H) 0 - 149 mg/dL   HDL 60 >39 mg/dL   VLDL Cholesterol Cal 27 5 - 40 mg/dL   LDL Chol Calc (NIH) 122 (H) 0 - 99 mg/dL  PSA     Status: None   Collection Time: 10/16/20  8:24 AM  Result Value Ref Range   Prostate Specific Ag, Serum <0.1 0.0 - 4.0 ng/mL    Comment: **Verified by repeat analysis** Roche ECLIA methodology. According to the American Urological Association, Serum PSA should decrease and remain at undetectable levels after radical prostatectomy. The AUA defines biochemical recurrence as an initial PSA value 0.2 ng/mL or greater followed by a  subsequent confirmatory PSA value 0.2 ng/mL or greater. Values obtained with different assay methods or kits cannot be used interchangeably. Results cannot be interpreted as absolute evidence of the presence or absence of malignant disease.   TSH     Status: None   Collection Time: 10/16/20  8:24 AM  Result Value Ref Range   TSH 3.440 0.450 - 4.500 uIU/mL  Iron and TIBC     Status: None   Collection Time: 10/16/20  8:24 AM  Result Value Ref Range   Total Iron Binding Capacity 284 250 - 450 ug/dL   UIBC 174 111 - 343 ug/dL   Iron 110 38 - 169 ug/dL   Iron Saturation 39 15 - 55 %  Ferritin     Status: None   Collection Time: 10/16/20  8:24 AM  Result Value Ref Range   Ferritin 87 30 - 400 ng/mL    Radiology: No results found.  No results found.  No results found.    Assessment and Plan: Patient Active Problem List   Diagnosis Date Noted   Aortic atherosclerosis ([place]) 05/09/2020   CPAP use counseling 12/20/2019   Acquired trigger finger of left middle finger 06/16/2019   PUD (peptic ulcer disease)    Empyema ([place]) 01/20/2019   Hypokalemia    Empyema of left pleural space ([place]) 01/13/2019   GERD (gastroesophageal reflux disease) 08/25/2018   Iron (Fe) deficiency anemia 08/25/2018   Trigger finger, right middle finger 02/02/2018   Advanced care planning/counseling discussion 05/27/2017   Prostate cancer ([place]) 05/27/2017   BPH with obstruction/lower urinary tract symptoms 04/15/2015   Acute anxiety 02/23/2015   Prostate nodule 10/10/2014   OSA on CPAP 08/31/2014   Chronic GI bleeding 08/17/2014   Hyperlipidemia    HTN (hypertension), benign    COPD (chronic obstructive pulmonary disease) ([place])    1. OSA on CPAP The patient does tolerate PAP and reports  benefit from PAP use. The patient was reminded how to clean equipment and advised to replace supplies routinely. The patient was also counselled on weight loss. The compliance is excellent. The AHI is 2.1.   OSA-  continue with excellent compliance with pap. F/u one year.    2. CPAP use counseling CPAP Counseling: had a lengthy discussion with the patient regarding the importance of PAP therapy in management of the sleep apnea. Patient appears to understand the risk factor reduction and also understands the risks associated with untreated sleep apnea. Patient  will try to make a good faith effort to remain compliant with therapy. Also instructed the patient on proper cleaning of the device including the water must be changed daily if possible and use of distilled water is preferred. Patient understands that the machine should be regularly cleaned with appropriate recommended cleaning solutions that do not damage the PAP machine for example given white vinegar and water rinses. Other methods such as ozone treatment may not be as good as these simple methods to achieve cleaning.   3. HTN (hypertension), benign Hypertension Counseling:   The following hypertensive lifestyle modification were recommended and discussed:  1. Limiting alcohol intake to less than 1 oz/day of ethanol:(24 oz of beer or 8 oz of wine or 2 oz of 100-proof whiskey). 2. Take baby ASA 81 mg daily. 3. Importance of regular aerobic exercise and losing weight. 4. Reduce dietary saturated fat and cholesterol intake for overall cardiovascular health. 5. Maintaining adequate dietary potassium, calcium, and magnesium intake. 6. Regular monitoring of the blood pressure. 7. Reduce sodium intake to less than 100 mmol/day (less than 2.3 gm of sodium or less than 6 gm of sodium choride)    4. Chronic bronchitis, unspecified chronic bronchitis type ([place]) Denies symptoms. Stable.      General Counseling: I have discussed the findings of the evaluation and examination with Jonathan Burns.  I have also discussed any further diagnostic evaluation thatmay be needed or ordered today. Jonathan Burns verbalizes understanding of the findings of todays visit. We also reviewed his  medications today and discussed drug interactions and side effects including but not limited excessive drowsiness and altered mental states. We also discussed that there is always a risk not just to him but also people around him. he has been encouraged to call the office with any questions or concerns that should arise related to todays visit.  No orders of the defined types were placed in this encounter.       I have personally obtained a history, examined the patient, evaluated laboratory and imaging results, formulated the assessment and plan and placed orders. This patient was seen today by Tressie Ellis, PA-C in collaboration with Dr. Devona Konig.   Allyne Gee, MD [facility] Diplomate ABMS Pulmonary Critical Care Medicine and Sleep Medicine

## 2021-02-07 DIAGNOSIS — Z86018 Personal history of other benign neoplasm: Secondary | ICD-10-CM | POA: Diagnosis not present

## 2021-02-07 DIAGNOSIS — L578 Other skin changes due to chronic exposure to nonionizing radiation: Secondary | ICD-10-CM | POA: Diagnosis not present

## 2021-02-07 DIAGNOSIS — Z872 Personal history of diseases of the skin and subcutaneous tissue: Secondary | ICD-10-CM | POA: Diagnosis not present

## 2021-02-07 DIAGNOSIS — L570 Actinic keratosis: Secondary | ICD-10-CM | POA: Diagnosis not present

## 2021-03-22 ENCOUNTER — Other Ambulatory Visit: Payer: Self-pay

## 2021-03-22 ENCOUNTER — Other Ambulatory Visit: Payer: Medicare Other

## 2021-03-22 DIAGNOSIS — C61 Malignant neoplasm of prostate: Secondary | ICD-10-CM

## 2021-03-23 LAB — PSA: Prostate Specific Ag, Serum: <0.1 ng/mL (ref 0.0–4.0)

## 2021-03-26 NOTE — Progress Notes (Signed)
05/06/2018 9:37 AM   [name] 1945-09-19 [id number]  Referring provider: Valerie Roys, DO [place],  [place] [address]  Chief Complaint  Patient presents with   Prostate Cancer    Urological history 1.  Prostate cancer -PSA <0.1 03/2021 -high risk prostate cancer - underwent a biopsy for an elevated PSA of 6.0, and a right-sided prostate nodule. TRUS volume 33 cc.  Prostate biopsy revealed 12 of 12 cores positive including lesions up to Gleason 4+4 involving 80% of the tissue on the right lateral mid core 03/2017 - completed 07/2017 IM RT radiation therapy to both his prostate and pelvic nodes - currently on ADT - taking calcium and Vitamin D  2. LU TS -I PSS 5/1 -managed with tamsulosin 0.4 mg daily   3.  High risk hematuria - former smoker - CT with contrast 12/2018  noted The adrenal glands are unremarkable. There is no hydronephrosis on either side. There is symmetric enhancement and excretion of contrast by both kidneys. Subcentimeter bilateral renal hypodense foci are too small to characterize. The visualized ureters appear unremarkable. The urinary bladder is collapsed. There is apparent diffuse thickening of the bladder wall which may be partly related to underdistention. Cystitis is not excluded. Correlation with urinalysis recommended.  Partially visualized loculated appearing moderate-sized left pleural effusion with findings concerning for empyema - for which he was hospitalized -Cysto 03/29/2019 with Dr. Bernardo Heater Mild lateral lobe enlargement prostate, mild hypervascularity prostate -no reports of gross heme -UA negative for micro heme  Chief Complaint  Patient presents with   Prostate Cancer    HPI: Jonathan Burns is a 76 y.o. male who presents today for 16-monthfollow-up.  He has no urinary complaints at this time.  Patient denies any modifying or aggravating factors.  Patient denies any gross hematuria, dysuria or suprapubic/flank pain.  Patient  denies any fevers, chills, nausea or vomiting.     IPSS     Row Name 03/27/21 0800         International Prostate Symptom Score   How often have you had the sensation of not emptying your bladder? Less than 1 in 5     How often have you had to urinate less than every two hours? Less than 1 in 5 times     How often have you found you stopped and started again several times when you urinated? Not at All     How often have you found it difficult to postpone urination? Not at All     How often have you had a weak urinary stream? Less than 1 in 5 times     How often have you had to strain to start urination? Not at All     How many times did you typically get up at night to urinate? 2 Times     Total IPSS Score 5       Quality of Life due to urinary symptoms   If you were to spend the rest of your life with your urinary condition just the way it is now how would you feel about that? Pleased               Score:  1-7 Mild 8-19 Moderate 20-35 Severe   PMH: Past Medical History:  Diagnosis Date   Anemia    BPH (benign prostatic hypertrophy)    Cancer (HCC)    Chronic GI bleeding    COPD (chronic obstructive pulmonary disease) ([place]  Hyperlipidemia    Hypertension    Prostate nodule    PUD (peptic ulcer disease)    Rising PSA level     Surgical History: Past Surgical History:  Procedure Laterality Date   COLONOSCOPY  2015   ESOPHAGOGASTRODUODENOSCOPY  2015   FLEXIBLE BRONCHOSCOPY Left 01/20/2019   Procedure: FLEXIBLE BRONCHOSCOPY;  Surgeon: Nestor Lewandowsky, MD;  Location: [facility];  Service: Thoracic;  Laterality: Left;   THORACOTOMY Left 01/20/2019   Procedure: CONVERTED TO THORACOTOMY MAJOR;  Surgeon: Nestor Lewandowsky, MD;  Location: [facility];  Service: Thoracic;  Laterality: Left;   VIDEO ASSISTED THORACOSCOPY (VATS)/THOROCOTOMY Left 01/20/2019   Procedure: VIDEO ASSISTED THORACOSCOPY (VATS)/THOROCOTOMY;  Surgeon: Nestor Lewandowsky, MD;  Location: [facility];  Service:  Thoracic;  Laterality: Left;    Home Medications:  Current Outpatient Medications on File Prior to Visit  Medication Sig Dispense Refill   amLODipine (NORVASC) 10 MG tablet Take 1 tablet (10 mg total) by mouth daily. 90 tablet 1   baclofen (LIORESAL) 10 MG tablet Take 1 tablet (10 mg total) by mouth at bedtime. 30 tablet 0   carvedilol (COREG) 25 MG tablet Take 1 tablet (25 mg total) by mouth 2 (two) times daily with a meal. 180 tablet 1   Ferrous Sulfate (IRON) 325 (65 Fe) MG TABS Take 1 tablet (325 mg total) by mouth daily. 90 tablet 3   hydrochlorothiazide (HYDRODIURIL) 25 MG tablet 1 tab daily 90 tablet 1   LORazepam (ATIVAN) 0.5 MG tablet Take 1 tablet (0.5 mg total) by mouth daily as needed for anxiety. 30 tablet 0   losartan (COZAAR) 100 MG tablet Take 1 tablet (100 mg total) by mouth daily. 90 tablet 1   Multiple Vitamin (MULTIVITAMIN) tablet Take 1 tablet by mouth daily.     Multiple Vitamins-Minerals (PRESERVISION AREDS PO) Take 1 tablet by mouth 2 (two) times daily.     Omega-3 1000 MG CAPS Take 1 capsule by mouth daily.     pantoprazole (PROTONIX) 40 MG tablet Take 1 tablet (40 mg total) by mouth daily. 90 tablet 1   valACYclovir (VALTREX) 500 MG tablet Take 500 mg by mouth daily.     No current facility-administered medications on file prior to visit.      Allergies: No Known Allergies  Family History: Family History  Problem Relation Age of Onset   Cancer Mother        breast   Diabetes Mother    Aneurysm Father    Kidney disease Neg Hx    Prostate cancer Neg Hx    Kidney cancer Neg Hx     Social History:  reports that he quit smoking about 34 years ago. His smoking use included cigarettes. He has a 30.00 pack-year smoking history. He has never used smokeless tobacco. He reports current alcohol use of about 2.0 standard drinks per week. He reports that he does not use drugs.  ROS: For pertinent review of systems please refer to history of present  illness  Physical Exam: BP 117/72    Pulse (!) 59    Ht _0  (1.778 m)    Wt 207 lb (93.9 kg)    BMI 29.70 kg/m   Constitutional:  Well nourished. Alert and oriented, No acute distress. HEENT: [place] AT, mask in place.  Trachea midline Cardiovascular: No clubbing, cyanosis, or edema. Respiratory: Normal respiratory effort, no increased work of breathing. Neurologic: Grossly intact, no focal deficits, moving all 4 extremities. Psychiatric: Normal mood and affect.   Laboratory Data:  Component     Latest Ref Rng & Units 03/22/2021  Prostate Specific Ag, Serum     0.0 - 4.0 ng/mL <0.1   Component     Latest Ref Rng & Units 10/16/2020  TSH     0.450 - 4.500 uIU/mL 3.440   Urinalysis Component     Latest Ref Rng & Units 03/27/2021  Specific Gravity, UA     1.005 - 1.030 1.020  pH, UA     5.0 - 7.5 6.5  Color, UA     Yellow Yellow  Appearance Ur     Clear Clear  Leukocytes,UA     Negative Negative  Protein,UA     Negative/Trace Negative  Glucose, UA     Negative Negative  Ketones, UA     Negative Negative  RBC, UA     Negative Negative  Bilirubin, UA     Negative Negative  Urobilinogen, Ur     0.2 - 1.0 mg/dL 0.2  Nitrite, UA     Negative Negative  Microscopic Examination      See below:   Component     Latest Ref Rng & Units 03/27/2021          WBC, UA     0 - 5 /hpf 0-5  RBC     0 - 2 /hpf 0-2  Epithelial Cells (non renal)     0 - 10 /hpf None seen  Bacteria, UA     None seen/Few None seen   I have reviewed the labs.  Pertinent Imaging N/A  Assessment & Plan:    1. Prostate cancer - PSA undetectable - Status post IR MT radiation therapy to both his prostate and pelvic nodes for stage IIB Gleason 8 adenocarcinoma for the prostate - continue ADT - Eligard 45 mg given today  - RTC in 6 months for PSA and Eligard injection - end date 10/2020  2. BPH with LUTS -Continue tamsulosin 0.4 mg daily - refills given  3.  High risk hematuria - work up completed  03/2019 -hypervascular prostate - No reports of gross hematuria  - UA neg for micro heme     Return in about 6 months (around 09/24/2021) for PSA, I PSS and symptoms recheck .  Zara Council, PA-C  [facility] [address] [place], [place] [address] [phone]

## 2021-03-27 ENCOUNTER — Ambulatory Visit (INDEPENDENT_AMBULATORY_CARE_PROVIDER_SITE_OTHER): Payer: Medicare Other | Admitting: Urology

## 2021-03-27 ENCOUNTER — Encounter: Payer: Self-pay | Admitting: Urology

## 2021-03-27 ENCOUNTER — Other Ambulatory Visit: Payer: Self-pay

## 2021-03-27 VITALS — BP 117/72 | HR 59 | Ht 70.0 in | Wt 207.0 lb

## 2021-03-27 DIAGNOSIS — C61 Malignant neoplasm of prostate: Secondary | ICD-10-CM | POA: Diagnosis not present

## 2021-03-27 DIAGNOSIS — N401 Enlarged prostate with lower urinary tract symptoms: Secondary | ICD-10-CM | POA: Diagnosis not present

## 2021-03-27 DIAGNOSIS — N138 Other obstructive and reflux uropathy: Secondary | ICD-10-CM | POA: Diagnosis not present

## 2021-03-27 DIAGNOSIS — R319 Hematuria, unspecified: Secondary | ICD-10-CM

## 2021-03-27 LAB — URINALYSIS, COMPLETE
Bilirubin, UA: NEGATIVE
Glucose, UA: NEGATIVE
Ketones, UA: NEGATIVE
Leukocytes,UA: NEGATIVE
Nitrite, UA: NEGATIVE
Protein,UA: NEGATIVE
RBC, UA: NEGATIVE
Specific Gravity, UA: 1.02 (ref 1.005–1.030)
Urobilinogen, Ur: 0.2 mg/dL (ref 0.2–1.0)
pH, UA: 6.5 (ref 5.0–7.5)

## 2021-03-27 LAB — MICROSCOPIC EXAMINATION
Bacteria, UA: NONE SEEN
Epithelial Cells (non renal): NONE SEEN /hpf (ref 0–10)

## 2021-03-27 MED ORDER — TAMSULOSIN HCL 0.4 MG PO CAPS: 0.4000 mg | ORAL_CAPSULE | Freq: Every day | ORAL | 3 refills | Status: DC

## 2021-04-17 ENCOUNTER — Ambulatory Visit (INDEPENDENT_AMBULATORY_CARE_PROVIDER_SITE_OTHER): Payer: Medicare Other | Admitting: Family Medicine

## 2021-04-17 ENCOUNTER — Other Ambulatory Visit: Payer: Self-pay

## 2021-04-17 ENCOUNTER — Encounter: Payer: Self-pay | Admitting: Family Medicine

## 2021-04-17 VITALS — BP 114/67 | HR 64 | Temp 98.7°F | Wt 217.0 lb

## 2021-04-17 DIAGNOSIS — J42 Unspecified chronic bronchitis: Secondary | ICD-10-CM

## 2021-04-17 DIAGNOSIS — N401 Enlarged prostate with lower urinary tract symptoms: Secondary | ICD-10-CM

## 2021-04-17 DIAGNOSIS — D508 Other iron deficiency anemias: Secondary | ICD-10-CM

## 2021-04-17 DIAGNOSIS — I10 Essential (primary) hypertension: Secondary | ICD-10-CM

## 2021-04-17 DIAGNOSIS — E785 Hyperlipidemia, unspecified: Secondary | ICD-10-CM

## 2021-04-17 DIAGNOSIS — K219 Gastro-esophageal reflux disease without esophagitis: Secondary | ICD-10-CM

## 2021-04-17 DIAGNOSIS — I700 Atherosclerosis of aorta: Secondary | ICD-10-CM

## 2021-04-17 DIAGNOSIS — N138 Other obstructive and reflux uropathy: Secondary | ICD-10-CM

## 2021-04-17 DIAGNOSIS — K922 Gastrointestinal hemorrhage, unspecified: Secondary | ICD-10-CM

## 2021-04-17 LAB — MICROALBUMIN, URINE WAIVED
Creatinine, Urine Waived: 100 mg/dL (ref 10–300)
Microalb, Ur Waived: 10 mg/L (ref 0–19)
Microalb/Creat Ratio: <30 mg/g (ref <30)

## 2021-04-17 MED ORDER — IRON 325 (65 FE) MG PO TABS: 1.0000 | ORAL_TABLET | Freq: Every day | ORAL | 3 refills | Status: DC

## 2021-04-17 MED ORDER — BACLOFEN 10 MG PO TABS
10.0000 mg | ORAL_TABLET | Freq: Every day | ORAL | 0 refills | Status: DC
Start: 2021-04-17 — End: 2021-10-15

## 2021-04-17 MED ORDER — HYDROCHLOROTHIAZIDE 25 MG PO TABS: ORAL_TABLET | ORAL | 1 refills | Status: DC

## 2021-04-17 MED ORDER — CARVEDILOL 25 MG PO TABS: 25.0000 mg | ORAL_TABLET | Freq: Two times a day (BID) | ORAL | 1 refills | Status: DC

## 2021-04-17 MED ORDER — TAMSULOSIN HCL 0.4 MG PO CAPS: 0.4000 mg | ORAL_CAPSULE | Freq: Every day | ORAL | 3 refills | Status: DC

## 2021-04-17 MED ORDER — AMLODIPINE BESYLATE 10 MG PO TABS: 10.0000 mg | ORAL_TABLET | Freq: Every day | ORAL | 1 refills | Status: DC

## 2021-04-17 MED ORDER — LOSARTAN POTASSIUM 100 MG PO TABS
ORAL_TABLET | ORAL | 1 refills | Status: DC
Start: 2021-04-17 — End: 2021-10-15

## 2021-04-17 MED ORDER — PANTOPRAZOLE SODIUM 40 MG PO TBEC: 40.0000 mg | DELAYED_RELEASE_TABLET | Freq: Every day | ORAL | 1 refills | Status: DC

## 2021-04-17 NOTE — Assessment & Plan Note (Signed)
Under good control on current regimen. Continue current regimen. Continue to monitor. Call with any concerns. Refills given. Labs drawn today.   

## 2021-04-17 NOTE — Assessment & Plan Note (Signed)
Rechecking labs today. Await results. Treat as needed. Continue to monitor.

## 2021-04-17 NOTE — Assessment & Plan Note (Signed)
Will keep BP and cholesterol under good control. Continue diet and exercise. Call with any concerns.

## 2021-04-17 NOTE — Progress Notes (Signed)
BP 114/67    Pulse 64    Temp 98.7 F (37.1 C) (Oral)    Wt 217 lb (98.4 kg)    SpO2 97%    BMI 31.14 kg/m    Subjective:    Patient ID: Jonathan Burns, male    DOB: 08/09/1945, 76 y.o.   MRN: [id number]  HPI: Jonathan Burns is a 76 y.o. male  Chief Complaint  Patient presents with   Hypertension   HYPERTENSION / HYPERLIPIDEMIA Satisfied with current treatment? yes Duration of hypertension: chronic BP monitoring frequency: not checking BP medication side effects: no Past BP meds: HCTZ, carvedilol, amlodipine, losartan Duration of hyperlipidemia: chronic Cholesterol medication side effects: no Cholesterol supplements: fish oil Past cholesterol medications: none Medication compliance: excellent compliance Aspirin: no Recent stressors: no Recurrent headaches: no Visual changes: no Palpitations: no Dyspnea: no Chest pain: no Lower extremity edema: no Dizzy/lightheaded: no  BPH BPH status: controlled Satisfied with current treatment?: yes Medication side effects: no Medication compliance: excellent compliance Duration: chronic Nocturia: 1/night Urinary frequency:no Incomplete voiding: no Urgency: no Weak urinary stream: no Straining to start stream: no Dysuria: no Onset: gradual Severity: mild  ANEMIA Anemia status: controlled Compliance with treatment: excellent compliance Iron supplementation side effects: no Severity of anemia: mild Fatigue: no Decreased exercise tolerance: no  Dyspnea on exertion: no Palpitations: no Bleeding: no Pica: no   Relevant past medical, surgical, family and social history reviewed and updated as indicated. Interim medical history since our last visit reviewed. Allergies and medications reviewed and updated.  Review of Systems  Constitutional: Negative.   Respiratory: Negative.    Cardiovascular: Negative.   Gastrointestinal: Negative.   Musculoskeletal: Negative.   Neurological: Negative.   Psychiatric/Behavioral:  Negative.     Per HPI unless specifically indicated above     Objective:    BP 114/67    Pulse 64    Temp 98.7 F (37.1 C) (Oral)    Wt 217 lb (98.4 kg)    SpO2 97%    BMI 31.14 kg/m   Wt Readings from Last 3 Encounters:  04/17/21 217 lb (98.4 kg)  03/27/21 207 lb (93.9 kg)  12/25/20 215 lb (97.5 kg)    Physical Exam Vitals and nursing note reviewed.  Constitutional:      General: He is not in acute distress.    Appearance: Normal appearance. He is not ill-appearing, toxic-appearing or diaphoretic.  HENT:     Head: Normocephalic and atraumatic.     Right Ear: External ear normal.     Left Ear: External ear normal.     Nose: Nose normal.     Mouth/Throat:     Mouth: Mucous membranes are moist.     Pharynx: Oropharynx is clear.  Eyes:     General: No scleral icterus.       Right eye: No discharge.        Left eye: No discharge.     Extraocular Movements: Extraocular movements intact.     Conjunctiva/sclera: Conjunctivae normal.     Pupils: Pupils are equal, round, and reactive to light.  Cardiovascular:     Rate and Rhythm: Normal rate and regular rhythm.     Pulses: Normal pulses.     Heart sounds: Normal heart sounds. No murmur heard.   No friction rub. No gallop.  Pulmonary:     Effort: Pulmonary effort is normal. No respiratory distress.     Breath sounds: Normal breath sounds. No stridor. No wheezing, rhonchi or  rales.  Chest:     Chest wall: No tenderness.  Musculoskeletal:        General: Normal range of motion.     Cervical back: Normal range of motion and neck supple.  Skin:    General: Skin is warm and dry.     Capillary Refill: Capillary refill takes less than 2 seconds.     Coloration: Skin is not jaundiced or pale.     Findings: No bruising, erythema, lesion or rash.  Neurological:     General: No focal deficit present.     Mental Status: He is alert and oriented to person, place, and time. Mental status is at baseline.  Psychiatric:        Mood and  Affect: Mood normal.        Behavior: Behavior normal.        Thought Content: Thought content normal.        Judgment: Judgment normal.    Results for orders placed or performed in visit on 03/27/21  Microscopic Examination   Urine  Result Value Ref Range   WBC, UA 0-5 0 - 5 /hpf   RBC 0-2 0 - 2 /hpf   Epithelial Cells (non renal) None seen 0 - 10 /hpf   Bacteria, UA None seen None seen/Few  Urinalysis, Complete  Result Value Ref Range   Specific Gravity, UA 1.020 1.005 - 1.030   pH, UA 6.5 5.0 - 7.5   Color, UA Yellow Yellow   Appearance Ur Clear Clear   Leukocytes,UA Negative Negative   Protein,UA Negative Negative/Trace   Glucose, UA Negative Negative   Ketones, UA Negative Negative   RBC, UA Negative Negative   Bilirubin, UA Negative Negative   Urobilinogen, Ur 0.2 0.2 - 1.0 mg/dL   Nitrite, UA Negative Negative   Microscopic Examination See below:       Assessment & Plan:   Problem List Items Addressed This Visit       Cardiovascular and Mediastinum   HTN (hypertension), benign - Primary    Under good control on current regimen. Continue current regimen. Continue to monitor. Call with any concerns. Refills given. Labs drawn today.        Relevant Medications   amLODipine (NORVASC) 10 MG tablet   carvedilol (COREG) 25 MG tablet   hydrochlorothiazide (HYDRODIURIL) 25 MG tablet   losartan (COZAAR) 100 MG tablet   Other Relevant Orders   Comprehensive metabolic panel   Microalbumin, Urine Waived   Aortic atherosclerosis (HCC)    Will keep BP and cholesterol under good control. Continue diet and exercise. Call with any concerns.       Relevant Medications   amLODipine (NORVASC) 10 MG tablet   carvedilol (COREG) 25 MG tablet   hydrochlorothiazide (HYDRODIURIL) 25 MG tablet   losartan (COZAAR) 100 MG tablet   Other Relevant Orders   Comprehensive metabolic panel     Respiratory   COPD (chronic obstructive pulmonary disease) (HCC)    Under good control on  current regimen. Continue current regimen. Continue to monitor. Call with any concerns. Refills given. Labs drawn today.          Digestive   Chronic GI bleeding   Relevant Medications   pantoprazole (PROTONIX) 40 MG tablet   GERD (gastroesophageal reflux disease)    Under good control on current regimen. Continue current regimen. Continue to monitor. Call with any concerns. Refills given. Labs drawn today.        Relevant Medications  pantoprazole (PROTONIX) 40 MG tablet     Genitourinary   BPH with obstruction/lower urinary tract symptoms    Under good control on current regimen. Continue current regimen. Continue to monitor. Call with any concerns. Refills given. Labs drawn today.        Relevant Medications   tamsulosin (FLOMAX) 0.4 MG CAPS capsule     Other   Hyperlipidemia    Rechecking labs today. Await results. Treat as needed. Continue to monitor.       Relevant Medications   amLODipine (NORVASC) 10 MG tablet   carvedilol (COREG) 25 MG tablet   hydrochlorothiazide (HYDRODIURIL) 25 MG tablet   losartan (COZAAR) 100 MG tablet   Other Relevant Orders   Comprehensive metabolic panel   Lipid Panel w/o Chol/HDL Ratio   Iron (Fe) deficiency anemia    Rechecking labs today. Await results. Treat as needed. Continue to monitor.       Relevant Medications   Ferrous Sulfate (IRON) 325 (65 Fe) MG TABS   Other Relevant Orders   Comprehensive metabolic panel   CBC with Differential/Platelet   Other Visit Diagnoses     Essential hypertension       Relevant Medications   amLODipine (NORVASC) 10 MG tablet   carvedilol (COREG) 25 MG tablet   hydrochlorothiazide (HYDRODIURIL) 25 MG tablet   losartan (COZAAR) 100 MG tablet        Follow up plan: Return in about 6 months (around 10/15/2021).

## 2021-04-18 LAB — COMPREHENSIVE METABOLIC PANEL
ALT: 21 IU/L (ref 0–44)
AST: 18 IU/L (ref 0–40)
Albumin/Globulin Ratio: 1.5 (ref 1.2–2.2)
Albumin: 4 g/dL (ref 3.7–4.7)
Alkaline Phosphatase: 77 IU/L (ref 44–121)
BUN/Creatinine Ratio: 11 (ref 10–24)
BUN: 10 mg/dL (ref 8–27)
Bilirubin Total: 0.4 mg/dL (ref 0.0–1.2)
CO2: 24 mmol/L (ref 20–29)
Calcium: 9.4 mg/dL (ref 8.6–10.2)
Chloride: 97 mmol/L (ref 96–106)
Creatinine, Ser: 0.87 mg/dL (ref 0.76–1.27)
Globulin, Total: 2.6 g/dL (ref 1.5–4.5)
Glucose: 107 mg/dL — ABNORMAL HIGH (ref 70–99)
Potassium: 4.2 mmol/L (ref 3.5–5.2)
Sodium: 137 mmol/L (ref 134–144)
Total Protein: 6.6 g/dL (ref 6.0–8.5)
eGFR: 89 mL/min/{1.73_m2} (ref >59)

## 2021-04-18 LAB — LIPID PANEL W/O CHOL/HDL RATIO
Cholesterol, Total: 188 mg/dL (ref 100–199)
HDL: 57 mg/dL (ref >39)
LDL Chol Calc (NIH): 109 mg/dL — ABNORMAL HIGH (ref 0–99)
Triglycerides: 127 mg/dL (ref 0–149)
VLDL Cholesterol Cal: 22 mg/dL (ref 5–40)

## 2021-04-18 LAB — CBC WITH DIFFERENTIAL/PLATELET
Basophils Absolute: 0 10*3/uL (ref 0.0–0.2)
Basos: 0 %
EOS (ABSOLUTE): 0.2 10*3/uL (ref 0.0–0.4)
Eos: 3 %
Hematocrit: 42.4 % (ref 37.5–51.0)
Hemoglobin: 14.6 g/dL (ref 13.0–17.7)
Immature Grans (Abs): 0 10*3/uL (ref 0.0–0.1)
Immature Granulocytes: 0 %
Lymphocytes Absolute: 0.6 10*3/uL — ABNORMAL LOW (ref 0.7–3.1)
Lymphs: 9 %
MCH: 33.6 pg — ABNORMAL HIGH (ref 26.6–33.0)
MCHC: 34.4 g/dL (ref 31.5–35.7)
MCV: 98 fL — ABNORMAL HIGH (ref 79–97)
Monocytes Absolute: 0.7 10*3/uL (ref 0.1–0.9)
Monocytes: 12 %
Neutrophils Absolute: 4.6 10*3/uL (ref 1.4–7.0)
Neutrophils: 76 %
Platelets: 204 10*3/uL (ref 150–450)
RBC: 4.35 x10E6/uL (ref 4.14–5.80)
RDW: 13.1 % (ref 11.6–15.4)
WBC: 6 10*3/uL (ref 3.4–10.8)

## 2021-05-10 DIAGNOSIS — Z20822 Contact with and (suspected) exposure to covid-19: Secondary | ICD-10-CM | POA: Diagnosis not present

## 2021-05-29 DIAGNOSIS — H353131 Nonexudative age-related macular degeneration, bilateral, early dry stage: Secondary | ICD-10-CM | POA: Diagnosis not present

## 2021-05-29 DIAGNOSIS — H33312 Horseshoe tear of retina without detachment, left eye: Secondary | ICD-10-CM | POA: Diagnosis not present

## 2021-05-29 DIAGNOSIS — B0050 Herpesviral ocular disease, unspecified: Secondary | ICD-10-CM | POA: Diagnosis not present

## 2021-05-29 DIAGNOSIS — H5213 Myopia, bilateral: Secondary | ICD-10-CM | POA: Diagnosis not present

## 2021-05-29 DIAGNOSIS — H52223 Regular astigmatism, bilateral: Secondary | ICD-10-CM | POA: Diagnosis not present

## 2021-05-29 DIAGNOSIS — H43813 Vitreous degeneration, bilateral: Secondary | ICD-10-CM | POA: Diagnosis not present

## 2021-05-29 DIAGNOSIS — H524 Presbyopia: Secondary | ICD-10-CM | POA: Diagnosis not present

## 2021-05-29 DIAGNOSIS — H2513 Age-related nuclear cataract, bilateral: Secondary | ICD-10-CM | POA: Diagnosis not present

## 2021-06-01 ENCOUNTER — Ambulatory Visit: Payer: Medicare Other

## 2021-06-07 DIAGNOSIS — Z20822 Contact with and (suspected) exposure to covid-19: Secondary | ICD-10-CM | POA: Diagnosis not present

## 2021-06-12 ENCOUNTER — Ambulatory Visit (INDEPENDENT_AMBULATORY_CARE_PROVIDER_SITE_OTHER): Payer: Medicare Other | Admitting: *Deleted

## 2021-06-12 DIAGNOSIS — Z0000 Encounter for general adult medical examination without abnormal findings: Secondary | ICD-10-CM

## 2021-06-12 NOTE — Patient Instructions (Signed)
Jonathan Burns , ?Thank you for taking time to come for your Medicare Wellness Visit. I appreciate your ongoing commitment to your health goals. Please review the following plan we discussed and let me know if I can assist you in the future.  ? ?Screening recommendations/referrals: ?Colonoscopy: no longer indicated ?Recommended yearly ophthalmology/optometry visit for glaucoma screening and checkup ?Recommended yearly dental visit for hygiene and checkup ? ?Vaccinations: ?Influenza vaccine: up to date ?Pneumococcal vaccine: up to date ?Tdap vaccine: up to date ?Shingles vaccine: up to date   ? ?Advanced directives: on file ? ?Conditions/risks identified:  ? ?Next appointment: 10-15-2021 @ 8:40  Jonathan Burns ? ?Preventive Care 78 Years and Older, Male ?Preventive care refers to lifestyle choices and visits with your health care provider that can promote health and wellness. ?What does preventive care include? ?A yearly physical exam. This is also called an annual well check. ?Dental exams once or twice a year. ?Routine eye exams. Ask your health care provider how often you should have your eyes checked. ?Personal lifestyle choices, including: ?Daily care of your teeth and gums. ?Regular physical activity. ?Eating a healthy diet. ?Avoiding tobacco and drug use. ?Limiting alcohol use. ?Practicing safe sex. ?Taking low doses of aspirin every day. ?Taking vitamin and mineral supplements as recommended by your health care provider. ?What happens during an annual well check? ?The services and screenings done by your health care provider during your annual well check will depend on your age, overall health, lifestyle risk factors, and family history of disease. ?Counseling  ?Your health care provider may ask you questions about your: ?Alcohol use. ?Tobacco use. ?Drug use. ?Emotional well-being. ?Home and relationship well-being. ?Sexual activity. ?Eating habits. ?History of falls. ?Memory and ability to understand (cognition). ?Work  and work Statistician. ?Screening  ?You may have the following tests or measurements: ?Height, weight, and BMI. ?Blood pressure. ?Lipid and cholesterol levels. These may be checked every 5 years, or more frequently if you are over 47 years old. ?Skin check. ?Lung cancer screening. You may have this screening every year starting at age 45 if you have a 30-pack-year history of smoking and currently smoke or have quit within the past 15 years. ?Fecal occult blood test (FOBT) of the stool. You may have this test every year starting at age 78. ?Flexible sigmoidoscopy or colonoscopy. You may have a sigmoidoscopy every 5 years or a colonoscopy every 10 years starting at age 78. ?Prostate cancer screening. Recommendations will vary depending on your family history and other risks. ?Hepatitis C blood test. ?Hepatitis B blood test. ?Sexually transmitted disease (STD) testing. ?Diabetes screening. This is done by checking your blood sugar (glucose) after you have not eaten for a while (fasting). You may have this done every 1-3 years. ?Abdominal aortic aneurysm (AAA) screening. You may need this if you are a current or former smoker. ?Osteoporosis. You may be screened starting at age 55 if you are at high risk. ?Talk with your health care provider about your test results, treatment options, and if necessary, the need for more tests. ?Vaccines  ?Your health care provider may recommend certain vaccines, such as: ?Influenza vaccine. This is recommended every year. ?Tetanus, diphtheria, and acellular pertussis (Tdap, Td) vaccine. You may need a Td booster every 10 years. ?Zoster vaccine. You may need this after age 78. ?Pneumococcal 13-valent conjugate (PCV13) vaccine. One dose is recommended after age 78. ?Pneumococcal polysaccharide (PPSV23) vaccine. One dose is recommended after age 78. ?Talk to your health care provider about which screenings  and vaccines you need and how often you need them. ?This information is not intended  to replace advice given to you by your health care provider. Make sure you discuss any questions you have with your health care provider. ?Document Released: 03/03/2015 Document Revised: 10/25/2015 Document Reviewed: 12/06/2014 ?Elsevier Interactive Patient Education ? 2017 [place]. ? ?Fall Prevention in the Home ?Falls can cause injuries. They can happen to people of all ages. There are many things you can do to make your home safe and to help prevent falls. ?What can I do on the outside of my home? ?Regularly fix the edges of walkways and driveways and fix any cracks. ?Remove anything that might make you trip as you walk through a door, such as a raised step or threshold. ?Trim any bushes or trees on the path to your home. ?Use bright outdoor lighting. ?Clear any walking paths of anything that might make someone trip, such as rocks or tools. ?Regularly check to see if handrails are loose or broken. Make sure that both sides of any steps have handrails. ?Any raised decks and porches should have guardrails on the edges. ?Have any leaves, snow, or ice cleared regularly. ?Use sand or salt on walking paths during winter. ?Clean up any spills in your garage right away. This includes oil or grease spills. ?What can I do in the bathroom? ?Use night lights. ?Install grab bars by the toilet and in the tub and shower. Do not use towel bars as grab bars. ?Use non-skid mats or decals in the tub or shower. ?If you need to sit down in the shower, use a plastic, non-slip stool. ?Keep the floor dry. Clean up any water that spills on the floor as soon as it happens. ?Remove soap buildup in the tub or shower regularly. ?Attach bath mats securely with double-sided non-slip rug tape. ?Do not have throw rugs and other things on the floor that can make you trip. ?What can I do in the bedroom? ?Use night lights. ?Make sure that you have a light by your bed that is easy to reach. ?Do not use any sheets or blankets that are too big  for your bed. They should not hang down onto the floor. ?Have a firm chair that has side arms. You can use this for support while you get dressed. ?Do not have throw rugs and other things on the floor that can make you trip. ?What can I do in the kitchen? ?Clean up any spills right away. ?Avoid walking on wet floors. ?Keep items that you use a lot in easy-to-reach places. ?If you need to reach something above you, use a strong step stool that has a grab bar. ?Keep electrical cords out of the way. ?Do not use floor polish or wax that makes floors slippery. If you must use wax, use non-skid floor wax. ?Do not have throw rugs and other things on the floor that can make you trip. ?What can I do with my stairs? ?Do not leave any items on the stairs. ?Make sure that there are handrails on both sides of the stairs and use them. Fix handrails that are broken or loose. Make sure that handrails are as long as the stairways. ?Check any carpeting to make sure that it is firmly attached to the stairs. Fix any carpet that is loose or worn. ?Avoid having throw rugs at the top or bottom of the stairs. If you do have throw rugs, attach them to the floor with carpet tape. ?  Make sure that you have a light switch at the top of the stairs and the bottom of the stairs. If you do not have them, ask someone to add them for you. ?What else can I do to help prevent falls? ?Wear shoes that: ?Do not have high heels. ?Have rubber bottoms. ?Are comfortable and fit you well. ?Are closed at the toe. Do not wear sandals. ?If you use a stepladder: ?Make sure that it is fully opened. Do not climb a closed stepladder. ?Make sure that both sides of the stepladder are locked into place. ?Ask someone to hold it for you, if possible. ?Clearly mark and make sure that you can see: ?Any grab bars or handrails. ?First and last steps. ?Where the edge of each step is. ?Use tools that help you move around (mobility aids) if they are needed. These  include: ?Canes. ?Walkers. ?Scooters. ?Crutches. ?Turn on the lights when you go into a dark area. Replace any light bulbs as soon as they burn out. ?Set up your furniture so you have a clear path. Avoid moving your fur

## 2021-06-12 NOTE — Progress Notes (Signed)
? ?Subjective:  ? Jonathan Burns is a 76 y.o. male who presents for Medicare Annual/Subsequent preventive examination. ? ?I connected with  Jonathan Burns on 06/12/21 by a telephone enabled telemedicine application and verified that I am speaking with the correct person using two identifiers. ?  ?I discussed the limitations of evaluation and management by telemedicine. The patient expressed understanding and agreed to proceed. ? ?Patient location: home ? ?Provider location: Tele-Health not in office ? ? ? ?Review of Systems    ? ?Cardiac Risk Factors include: advanced age (>37mn, >>80women);hypertension;male gender;obesity (BMI >30kg/m2) ? ?   ?Objective:  ?  ?Today's Vitals  ? ?There is no height or weight on file to calculate BMI. ? ? ?  06/12/2021  ?  8:35 AM 05/29/2020  ?  8:21 AM 09/15/2019  ?  9:27 AM 05/24/2019  ?  8:22 AM 01/20/2019  ? 12:23 PM 01/18/2019  ?  1:12 AM 01/13/2019  ? 12:00 PM  ?Advanced Directives  ?Does Patient Have a Medical Advance Directive? Yes Yes Yes Yes No  No  ?Type of [place]of [place] will [place] will Living will;Healthcare Power of Attorney     ?Does patient want to make changes to medical advance directive?   No - Patient declined      ?Copy of [place] Chart? Yes - validated most recent copy scanned in chart (See row information) No - copy requested No - copy requested Yes - validated most recent copy scanned in chart (See row information)     ?Would patient like information on creating a medical advance directive?     No - Patient declined No - Patient declined   ? ? ?Current Medications (verified) ?Outpatient Encounter Medications as of 06/12/2021  ?Medication Sig  ? amLODipine (NORVASC) 10 MG tablet Take 1 tablet (10 mg total) by mouth daily.  ? baclofen (LIORESAL) 10 MG tablet Take 1 tablet (10 mg total) by mouth at bedtime.  ? carvedilol (COREG) 25 MG tablet Take 1  tablet (25 mg total) by mouth 2 (two) times daily with a meal.  ? Ferrous Sulfate (IRON) 325 (65 Fe) MG TABS Take 1 tablet (325 mg total) by mouth daily.  ? hydrochlorothiazide (HYDRODIURIL) 25 MG tablet 1 tab daily  ? LORazepam (ATIVAN) 0.5 MG tablet Take 1 tablet (0.5 mg total) by mouth daily as needed for anxiety.  ? losartan (COZAAR) 100 MG tablet Take 1 tablet (100 mg total) by mouth daily.  ? Multiple Vitamin (MULTIVITAMIN) tablet Take 1 tablet by mouth daily.  ? Multiple Vitamins-Minerals (PRESERVISION AREDS PO) Take 1 tablet by mouth 2 (two) times daily.  ? Omega-3 1000 MG CAPS Take 1 capsule by mouth daily.  ? pantoprazole (PROTONIX) 40 MG tablet Take 1 tablet (40 mg total) by mouth daily.  ? valACYclovir (VALTREX) 500 MG tablet Take 500 mg by mouth daily.  ? tamsulosin (FLOMAX) 0.4 MG CAPS capsule Take 1 capsule (0.4 mg total) by mouth daily. (Patient not taking: Reported on 06/12/2021)  ? ?No facility-administered encounter medications on file as of 06/12/2021.  ? ? ?Allergies (verified) ?Patient has no known allergies.  ? ?History: ?Past Medical History:  ?Diagnosis Date  ? Anemia   ? BPH (benign prostatic hypertrophy)   ? Cancer ([facility]   ? Chronic GI bleeding   ? [place] obstructive [place] disease) ([place]   ? Hyperlipidemia   ? Hypertension   ?  Prostate nodule   ? PUD (peptic ulcer disease)   ? Rising PSA level   ? ?Past Surgical History:  ?Procedure Laterality Date  ? COLONOSCOPY  2015  ? ESOPHAGOGASTRODUODENOSCOPY  2015  ? FLEXIBLE BRONCHOSCOPY Left 01/20/2019  ? Procedure: FLEXIBLE BRONCHOSCOPY;  Surgeon: Nestor Lewandowsky, MD;  Location: [facility] ORS;  Service: Thoracic;  Laterality: Left;  ? THORACOTOMY Left 01/20/2019  ? Procedure: CONVERTED TO THORACOTOMY MAJOR;  Surgeon: Nestor Lewandowsky, MD;  Location: [facility] ORS;  Service: Thoracic;  Laterality: Left;  ? VIDEO ASSISTED THORACOSCOPY (VATS)/THOROCOTOMY Left 01/20/2019  ? Procedure: VIDEO ASSISTED THORACOSCOPY (VATS)/THOROCOTOMY;  Surgeon: Nestor Lewandowsky,  MD;  Location: [facility] ORS;  Service: Thoracic;  Laterality: Left;  ? ?Family History  ?Problem Relation Age of Onset  ? Cancer Mother   ?     breast  ? Diabetes Mother   ? Aneurysm Father   ? Kidney disease Neg Hx   ? Prostate cancer Neg Hx   ? Kidney cancer Neg Hx   ? ?Social History  ? ?Socioeconomic History  ? Marital status: Widowed  ?  Spouse name: Not on file  ? Number of children: Not on file  ? Years of education: Not on file  ? Highest education level: Associate degree: academic program  ?Occupational History  ? Not on file  ?Tobacco Use  ? Smoking status: Former  ?  Packs/day: 1.00  ?  Years: 30.00  ?  Pack years: 30.00  ?  Types: Cigarettes  ?  Quit date: 08/17/1986  ?  Years since quitting: 34.8  ? Smokeless tobacco: Never  ?Vaping Use  ? Vaping Use: Never used  ?Substance and Sexual Activity  ? Alcohol use: Yes  ?  Alcohol/week: 2.0 standard drinks  ?  Types: 2 Standard drinks or equivalent per week  ?  Comment: twice a week, mixes up between drinks   ? Drug use: No  ? Sexual activity: Not on file  ?Other Topics Concern  ? Not on file  ?Social History Narrative  ? [place]   ? Involved in [place] activities   ? ?Social Determinants of Health  ? ?Financial Resource Strain: Low Risk   ? Difficulty of Paying Living Expenses: Not hard at all  ?Food Insecurity: No Food Insecurity  ? Worried About Charity fundraiser in the Last Year: Never true  ? Ran Out of Food in the Last Year: Never true  ?Transportation Needs: No Transportation Needs  ? Lack of Transportation (Medical): No  ? Lack of Transportation (Non-Medical): No  ?Physical Activity: Sufficiently Active  ? Days of Exercise per Week: 4 days  ? Minutes of Exercise per Session: 40 min  ?Stress: No Stress Concern Present  ? Feeling of Stress : Not at all  ?Social Connections: Moderately Integrated  ? Frequency of Communication with Friends and Family: More than three times a week  ? Frequency of Social Gatherings with Friends and  Family: More than three times a week  ? Attends Religious Services: More than 4 times per year  ? Active Member of Clubs or Organizations: Yes  ? Attends Archivist Meetings: More than 4 times per year  ? Marital Status: Widowed  ? ? ?Tobacco Counseling ?Counseling given: Not Answered ? ? ?Clinical Intake: ? ?Pre-visit preparation completed: Yes ? ?Pain : No/denies pain ? ?  ? ?Nutritional Risks: None ?Diabetes: No ? ?How often do you need to have someone help you when you read instructions, pamphlets, or  other written materials from your doctor or pharmacy?: 1 - Never ? ?Diabetic?  no ? ?Interpreter Needed?: No ? ?Information entered by :: Leroy Kennedy LPN ? ? ?Activities of Daily Living ? ?  06/12/2021  ?  8:42 AM  ?In your present state of health, do you have any difficulty performing the following activities:  ?Hearing? 0  ?Vision? 0  ?Difficulty concentrating or making decisions? 0  ?Walking or climbing stairs? 0  ?Dressing or bathing? 0  ?Doing errands, shopping? 0  ?Preparing Food and eating ? N  ?Using the Toilet? N  ?In the past six months, have you accidently leaked urine? N  ?Do you have problems with loss of bowel control? N  ?Managing your Medications? N  ?Managing your Finances? N  ?Housekeeping or managing your Housekeeping? N  ? ? ?Patient Care Team: ?Valerie Roys, DO as PCP - General (Family Medicine) ?Hollice Espy, MD as Consulting Physician (Urology) ?Josefine Class, MD as Referring Physician (Gastroenterology) ?Laneta Simmers as Physician Assistant (Urology) ?[name], MD as Referring Physician (Radiation Oncology) ? ?Indicate any recent Medical Services you may have received from other than [facility] providers in the past year (date may be approximate). ? ?   ?Assessment:  ? This is a routine wellness examination for Elex. ? ?Hearing/Vision screen ?Hearing Screening - Comments:: No trouble hearing ?Vision Screening - Comments:: Dr. Matilde Sprang ?Up to date ? ?Dietary  issues and exercise activities discussed: ?Current Exercise Habits: Home exercise routine, Type of exercise: walking;strength training/weights, Time (Minutes): 30, Frequency (Times/Week): 4, Weekly Exercise (Min

## 2021-06-23 DIAGNOSIS — Z20822 Contact with and (suspected) exposure to covid-19: Secondary | ICD-10-CM | POA: Diagnosis not present

## 2021-06-25 DIAGNOSIS — Z20822 Contact with and (suspected) exposure to covid-19: Secondary | ICD-10-CM | POA: Diagnosis not present

## 2021-09-06 ENCOUNTER — Other Ambulatory Visit: Payer: Self-pay | Admitting: *Deleted

## 2021-09-07 DIAGNOSIS — H524 Presbyopia: Secondary | ICD-10-CM | POA: Diagnosis not present

## 2021-09-07 DIAGNOSIS — H43813 Vitreous degeneration, bilateral: Secondary | ICD-10-CM | POA: Diagnosis not present

## 2021-09-07 DIAGNOSIS — H353131 Nonexudative age-related macular degeneration, bilateral, early dry stage: Secondary | ICD-10-CM | POA: Diagnosis not present

## 2021-09-07 DIAGNOSIS — H2513 Age-related nuclear cataract, bilateral: Secondary | ICD-10-CM | POA: Diagnosis not present

## 2021-09-07 DIAGNOSIS — H5213 Myopia, bilateral: Secondary | ICD-10-CM | POA: Diagnosis not present

## 2021-09-07 DIAGNOSIS — H52223 Regular astigmatism, bilateral: Secondary | ICD-10-CM | POA: Diagnosis not present

## 2021-09-07 DIAGNOSIS — B0050 Herpesviral ocular disease, unspecified: Secondary | ICD-10-CM | POA: Diagnosis not present

## 2021-09-07 DIAGNOSIS — H33312 Horseshoe tear of retina without detachment, left eye: Secondary | ICD-10-CM | POA: Diagnosis not present

## 2021-09-11 ENCOUNTER — Other Ambulatory Visit: Payer: Medicare Other

## 2021-09-11 DIAGNOSIS — H52223 Regular astigmatism, bilateral: Secondary | ICD-10-CM | POA: Diagnosis not present

## 2021-09-11 DIAGNOSIS — H524 Presbyopia: Secondary | ICD-10-CM | POA: Diagnosis not present

## 2021-09-11 DIAGNOSIS — H353131 Nonexudative age-related macular degeneration, bilateral, early dry stage: Secondary | ICD-10-CM | POA: Diagnosis not present

## 2021-09-11 DIAGNOSIS — H2513 Age-related nuclear cataract, bilateral: Secondary | ICD-10-CM | POA: Diagnosis not present

## 2021-09-11 DIAGNOSIS — B0050 Herpesviral ocular disease, unspecified: Secondary | ICD-10-CM | POA: Diagnosis not present

## 2021-09-11 DIAGNOSIS — H5213 Myopia, bilateral: Secondary | ICD-10-CM | POA: Diagnosis not present

## 2021-09-11 DIAGNOSIS — H43813 Vitreous degeneration, bilateral: Secondary | ICD-10-CM | POA: Diagnosis not present

## 2021-09-11 DIAGNOSIS — H33312 Horseshoe tear of retina without detachment, left eye: Secondary | ICD-10-CM | POA: Diagnosis not present

## 2021-09-13 DIAGNOSIS — H52223 Regular astigmatism, bilateral: Secondary | ICD-10-CM | POA: Diagnosis not present

## 2021-09-13 DIAGNOSIS — B0050 Herpesviral ocular disease, unspecified: Secondary | ICD-10-CM | POA: Diagnosis not present

## 2021-09-13 DIAGNOSIS — H43813 Vitreous degeneration, bilateral: Secondary | ICD-10-CM | POA: Diagnosis not present

## 2021-09-13 DIAGNOSIS — H353131 Nonexudative age-related macular degeneration, bilateral, early dry stage: Secondary | ICD-10-CM | POA: Diagnosis not present

## 2021-09-13 DIAGNOSIS — H5213 Myopia, bilateral: Secondary | ICD-10-CM | POA: Diagnosis not present

## 2021-09-13 DIAGNOSIS — H524 Presbyopia: Secondary | ICD-10-CM | POA: Diagnosis not present

## 2021-09-13 DIAGNOSIS — H2513 Age-related nuclear cataract, bilateral: Secondary | ICD-10-CM | POA: Diagnosis not present

## 2021-09-13 DIAGNOSIS — H33312 Horseshoe tear of retina without detachment, left eye: Secondary | ICD-10-CM | POA: Diagnosis not present

## 2021-09-17 ENCOUNTER — Ambulatory Visit: Payer: Medicare Other | Admitting: Radiation Oncology

## 2021-09-24 ENCOUNTER — Other Ambulatory Visit: Payer: Medicare Other

## 2021-09-24 DIAGNOSIS — C61 Malignant neoplasm of prostate: Secondary | ICD-10-CM

## 2021-09-25 LAB — PSA: Prostate Specific Ag, Serum: <0.1 ng/mL (ref 0.0–4.0)

## 2021-09-25 IMAGING — CR DG CHEST 2V
1 series · 2 of 2 positions shown · non-contrast
Comparison: Chest radiograph 01/26/2019.

CLINICAL DATA: Postop check. Additional provided: Postop check from
thoracotomy on 01/20/2019.

EXAM:
CHEST - 2 VIEW

[Series 1: dg chest 2 view · 0.14mm/px · 2 of 2 slices shown]
[im 1/2]
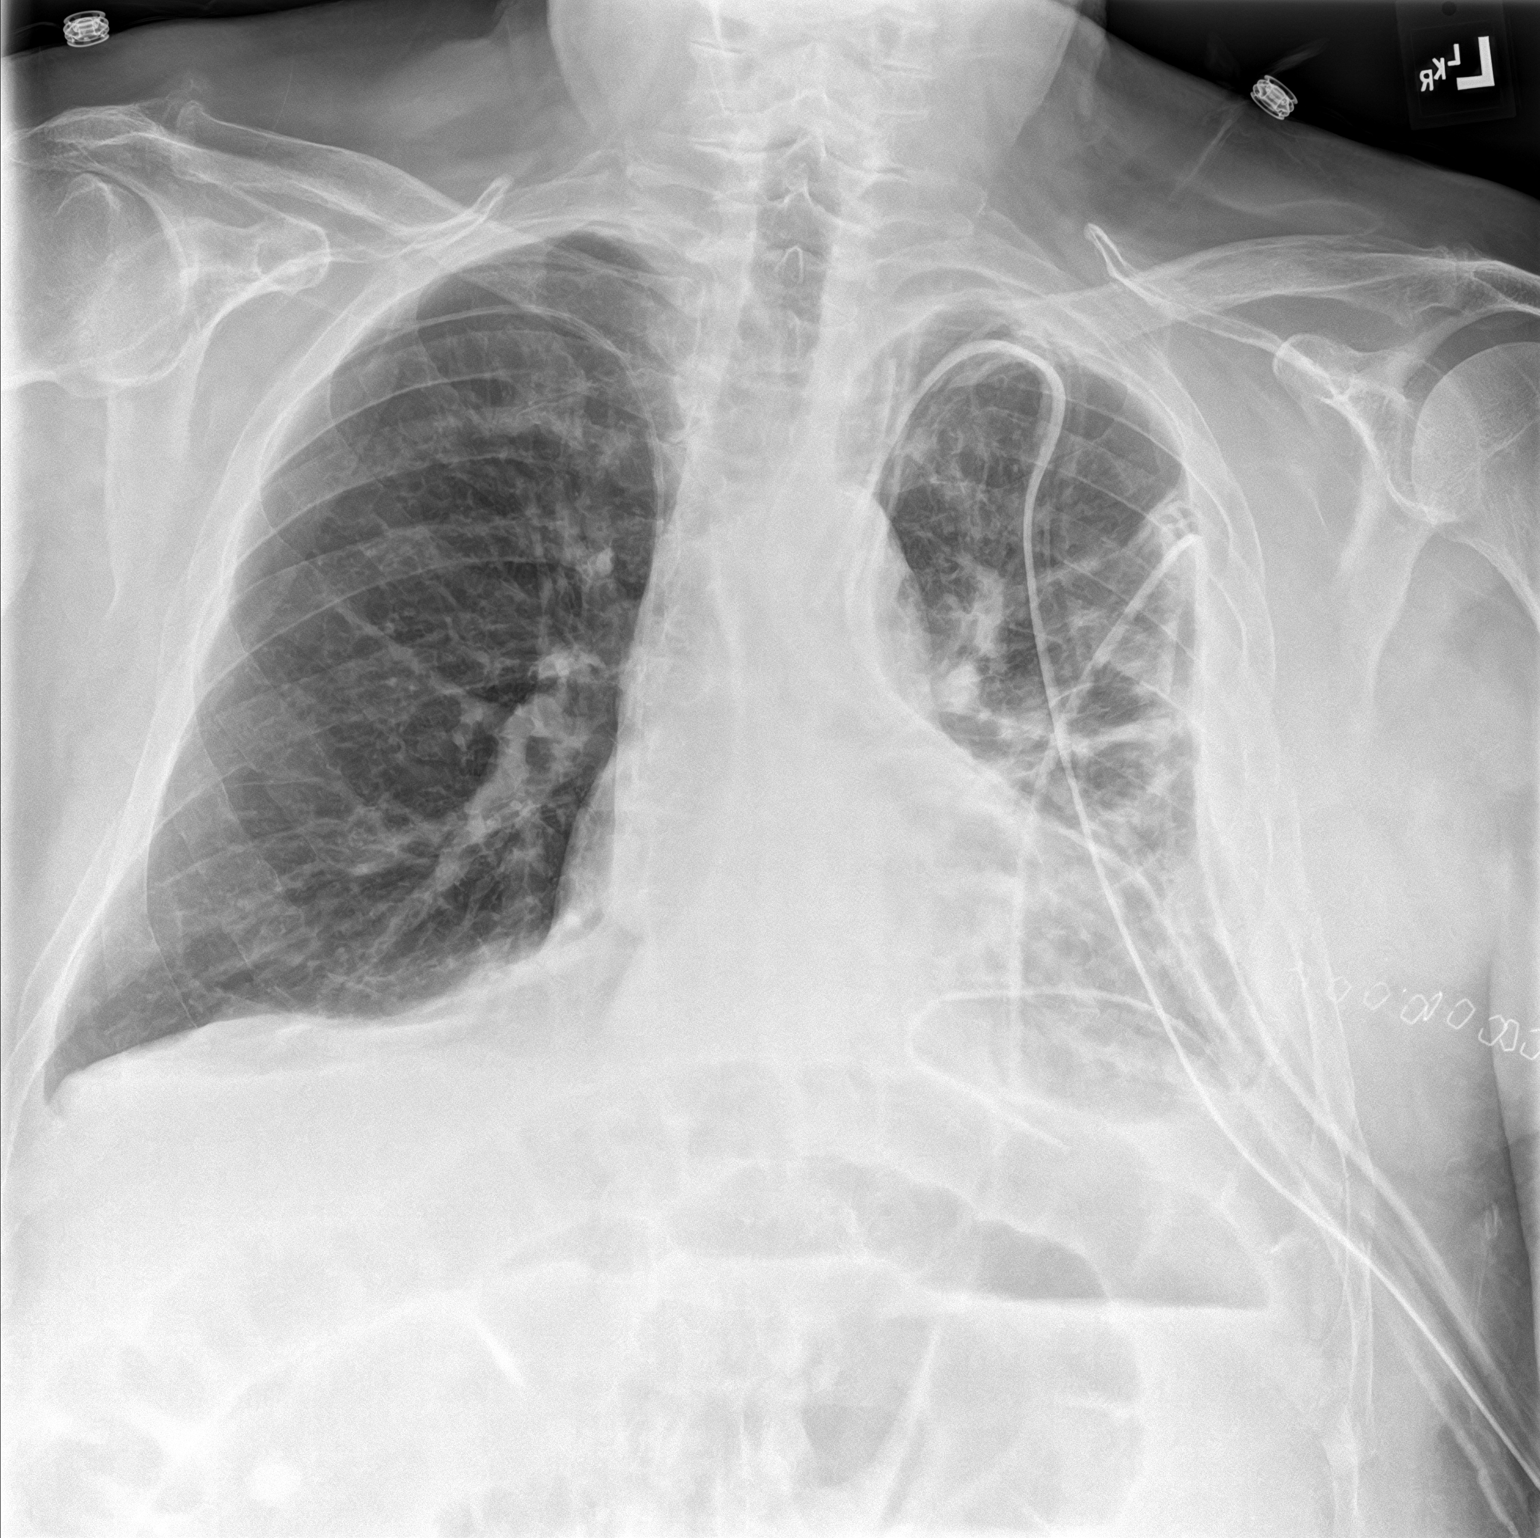
[im 2/2]
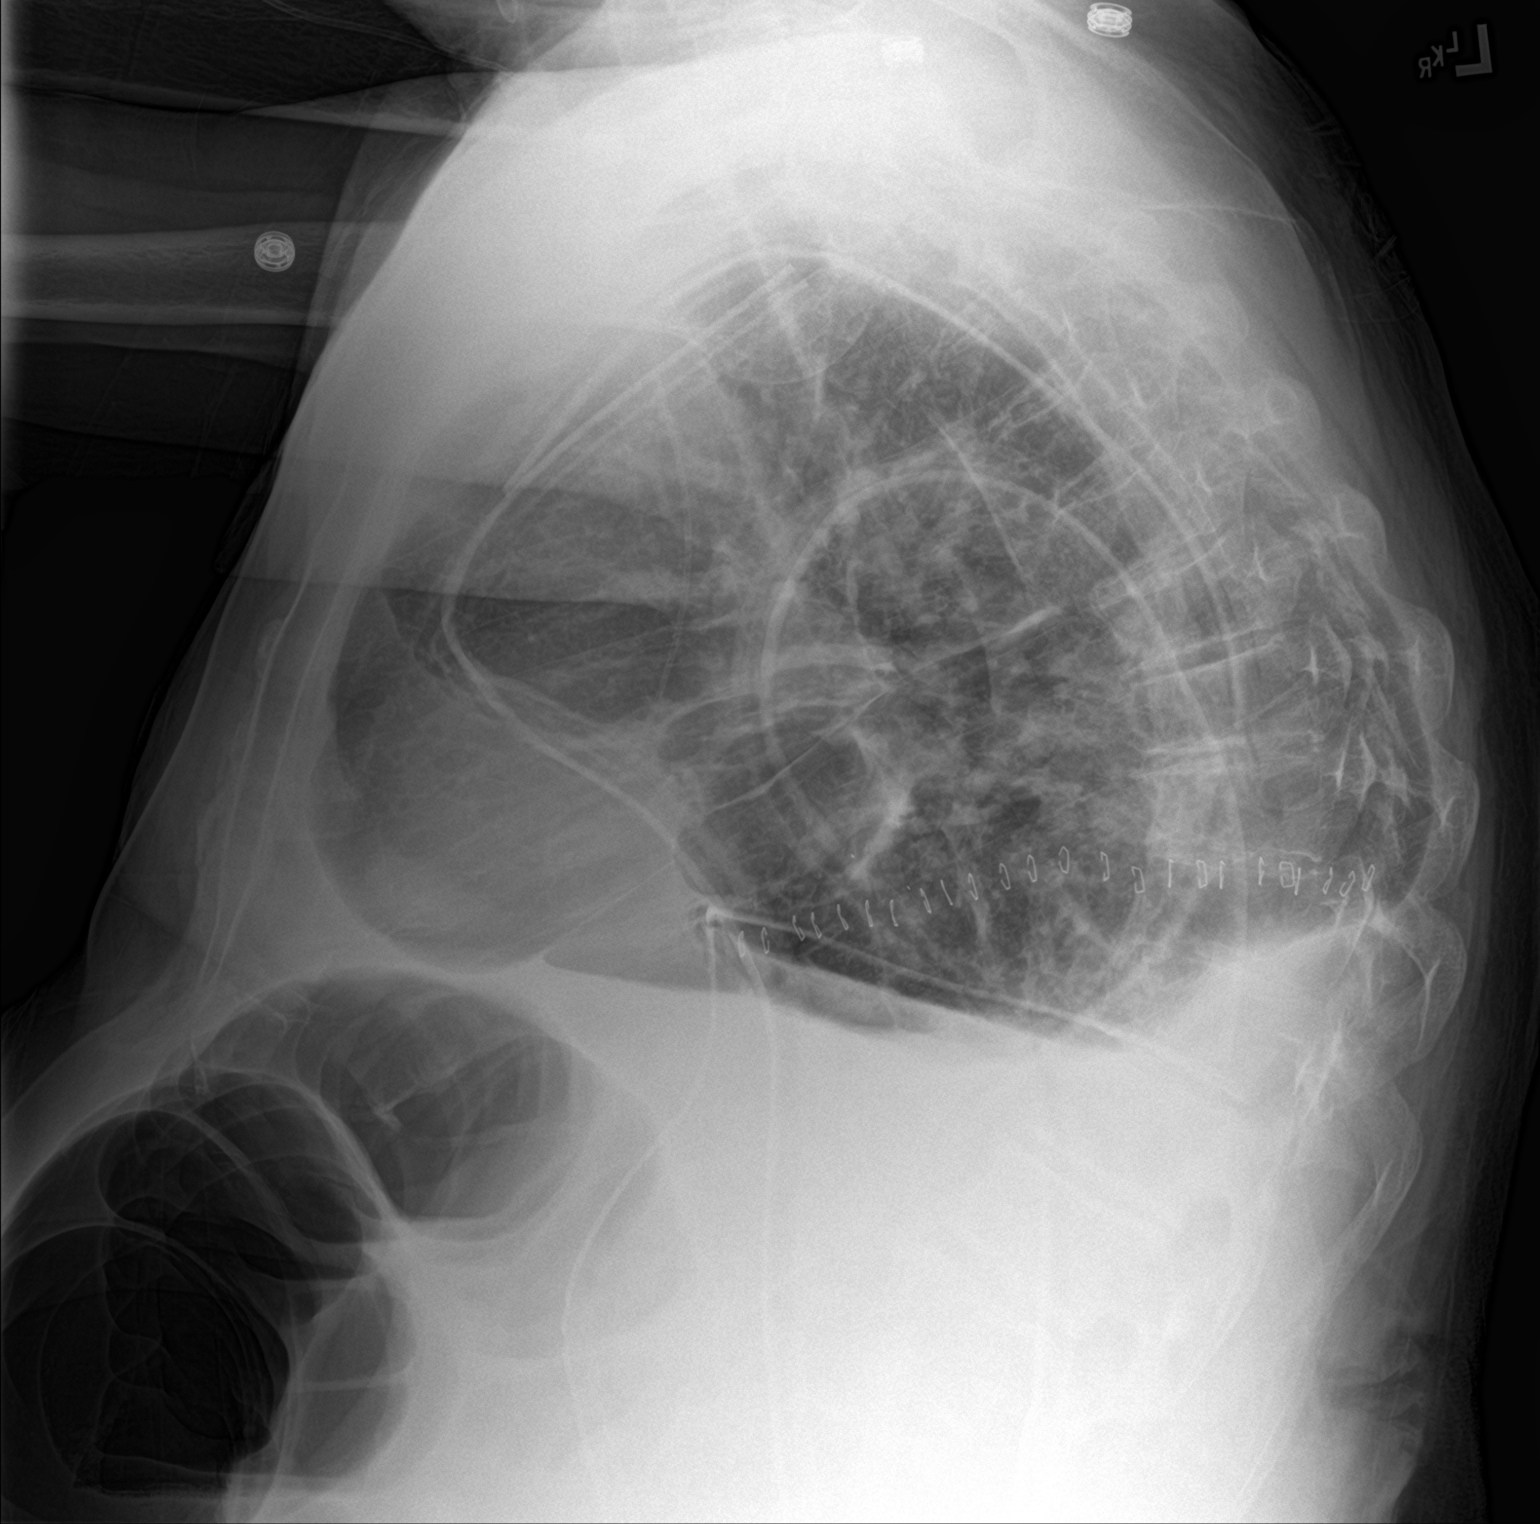

[2 of 2 positions shown; findings below may reference images not displayed]

FINDINGS: Again demonstrated are multiple left-sided thoracostomy tube. There
is no appreciable pneumothorax. Unchanged pleural fluid in the
posterior costophrenic angle.

Ill-defined opacities within the left lung are similar to prior
examination may reflect incomplete atelectasis.

No airspace consolidation within the right lung. Heart size within
normal limits.

Unchanged position of a right-sided PICC with tip projecting in the
region of the upper right atrium.

Air distention of multiple bowel loops within the partially imaged
upper abdomen.
IMPRESSION: No significant interval change as compared to chest radiograph
01/26/2019,

Multiple left-sided thoracostomy tubes. No appreciable pneumothorax.
Persistent pleural fluid in the left posterior costophrenic angle.

Ill-defined opacities within the left lung likely reflecting
incomplete atelectasis.

Air distention of multiple bowel loops within the partially imaged
upper abdomen. Findings may reflect postoperative ileus. Consider
dedicated abdominal radiographs, if clinically warranted.

## 2021-09-26 ENCOUNTER — Ambulatory Visit: Payer: Medicare Other | Admitting: Urology

## 2021-10-01 NOTE — Progress Notes (Unsigned)
05/06/2018 8:59 AM   Jonathan Burns [name] 01-30-1946 [id number]  Referring provider: Valerie Roys, DO [place],  [place] [address]  Urological history 1.  Prostate cancer -PSA <0.1 09/2021 -high risk prostate cancer - underwent a biopsy for an elevated PSA of 6.0, and a right-sided prostate nodule. TRUS volume 33 cc.  Prostate biopsy revealed 12 of 12 cores positive including lesions up to Gleason 4+4 involving 80% of the tissue on the right lateral mid core 03/2017 - completed 07/2017 IM RT radiation therapy to both his prostate and pelvic nodes - taking calcium and Vitamin D  2. LU TS -I PSS *** -tamsulosin 0.4 mg daily   3.  High risk hematuria - former smoker - CT with contrast 12/2018  noted The adrenal glands are unremarkable. There is no hydronephrosis on either side. There is symmetric enhancement and excretion of contrast by both kidneys. Subcentimeter bilateral renal hypodense foci are too small to characterize. The visualized ureters appear unremarkable. The urinary bladder is collapsed. There is apparent diffuse thickening of the bladder wall which may be partly related to underdistention. Cystitis is not excluded. Correlation with urinalysis recommended.  Partially visualized loculated appearing moderate-sized left pleural effusion with findings concerning for empyema - for which he was hospitalized -Cysto 03/29/2019 with Dr. Bernardo Heater Mild lateral lobe enlargement prostate, mild hypervascularity prostate -no reports of gross heme -UA ***  No chief complaint on file.   HPI: BRONDON WANN is a 76 y.o. male who presents today for 76-monthfollow-up.       Score:  1-7 Mild 8-19 Moderate 20-35 Severe   PMH: Past Medical History:  Diagnosis Date   Anemia    BPH (benign prostatic hypertrophy)    Cancer (HCC)    Chronic GI bleeding    COPD (chronic obstructive pulmonary disease) (HCC)    Hyperlipidemia    Hypertension    Prostate nodule    PUD (peptic ulcer  disease)    Rising PSA level     Surgical History: Past Surgical History:  Procedure Laterality Date   COLONOSCOPY  2015   ESOPHAGOGASTRODUODENOSCOPY  2015   FLEXIBLE BRONCHOSCOPY Left 01/20/2019   Procedure: FLEXIBLE BRONCHOSCOPY;  Surgeon: ONestor Lewandowsky MD;  Location: [facility];  Service: Thoracic;  Laterality: Left;   THORACOTOMY Left 01/20/2019   Procedure: CONVERTED TO THORACOTOMY MAJOR;  Surgeon: ONestor Lewandowsky MD;  Location: [facility] OR[facility];  Service: Thoracic;  Laterality: Left;   VIDEO ASSISTED THORACOSCOPY (VATS)/THOROCOTOMY Left 01/20/2019   Procedure: VIDEO ASSISTED THORACOSCOPY (VATS)/THOROCOTOMY;  Surgeon: ONestor Lewandowsky MD;  Location: [facility];  Service: Thoracic;  Laterality: Left;    Home Medications:  Current Outpatient Medications on File Prior to Visit  Medication Sig Dispense Refill   amLODipine (NORVASC) 10 MG tablet Take 1 tablet (10 mg total) by mouth daily. 90 tablet 1   baclofen (LIORESAL) 10 MG tablet Take 1 tablet (10 mg total) by mouth at bedtime. 30 tablet 0   carvedilol (COREG) 25 MG tablet Take 1 tablet (25 mg total) by mouth 2 (two) times daily with a meal. 180 tablet 1   Ferrous Sulfate (IRON) 325 (65 Fe) MG TABS Take 1 tablet (325 mg total) by mouth daily. 90 tablet 3   hydrochlorothiazide (HYDRODIURIL) 25 MG tablet 1 tab daily 90 tablet 1   LORazepam (ATIVAN) 0.5 MG tablet Take 1 tablet (0.5 mg total) by mouth daily as needed for anxiety. 30 tablet 0   losartan (COZAAR) 100 MG tablet Take 1  tablet (100 mg total) by mouth daily. 90 tablet 1   Multiple Vitamin (MULTIVITAMIN) tablet Take 1 tablet by mouth daily.     Multiple Vitamins-Minerals (PRESERVISION AREDS PO) Take 1 tablet by mouth 2 (two) times daily.     Omega-3 1000 MG CAPS Take 1 capsule by mouth daily.     pantoprazole (PROTONIX) 40 MG tablet Take 1 tablet (40 mg total) by mouth daily. 90 tablet 1   tamsulosin (FLOMAX) 0.4 MG CAPS capsule Take 1 capsule (0.4 mg total) by mouth daily. (Patient not  taking: Reported on 06/12/2021) 90 capsule 3   valACYclovir (VALTREX) 500 MG tablet Take 500 mg by mouth daily.     No current facility-administered medications on file prior to visit.      Allergies: No Known Allergies  Family History: Family History  Problem Relation Age of Onset   Cancer Mother        breast   Diabetes Mother    Aneurysm Father    Kidney disease Neg Hx    Prostate cancer Neg Hx    Kidney cancer Neg Hx     Social History:  reports that he quit smoking about 35 years ago. His smoking use included cigarettes. He has a 30.00 pack-year smoking history. He has never used smokeless tobacco. He reports current alcohol use of about 2.0 standard drinks of alcohol per week. He reports that he does not use drugs.  ROS: For pertinent review of systems please refer to history of present illness  Physical Exam: There were no vitals taken for this visit.  Constitutional:  Well nourished. Alert and oriented, No acute distress. HEENT: [place] AT, moist mucus membranes.  Trachea midline Cardiovascular: No clubbing, cyanosis, or edema. Respiratory: Normal respiratory effort, no increased work of breathing. GU: No CVA tenderness.  No bladder fullness or masses.  Patient with circumcised/uncircumcised phallus. ***Foreskin easily retracted***  Urethral meatus is patent.  No penile discharge. No penile lesions or rashes. Scrotum without lesions, cysts, rashes and/or edema.  Testicles are located scrotally bilaterally. No masses are appreciated in the testicles. Left and right epididymis are normal. Rectal: Patient with  normal sphincter tone. Anus and perineum without scarring or rashes. No rectal masses are appreciated. Prostate is approximately *** grams, *** nodules are appreciated. Seminal vesicles are normal. Neurologic: Grossly intact, no focal deficits, moving all 4 extremities. Psychiatric: Normal mood and affect.   Laboratory Data:  Prostate Specific Ag, Serum  Latest Ref Rng  0.0 - 4.0 ng/mL  04/10/2017 6.0 (H)   05/06/2018 <0.1   11/03/2018 <0.1   03/09/2019 <0.1   10/15/2019 <0.1   03/15/2020 <0.1   10/16/2020 <0.1   03/22/2021 <0.1   09/24/2021 <0.1     Legend: (H) High  Urinalysis ***  I have reviewed the labs.  Pertinent Imaging N/A  Assessment & Plan:    1. Prostate cancer - PSA undetectable - Status post IR MT radiation therapy to both his prostate and pelvic nodes for stage IIB Gleason 8 adenocarcinoma for the prostate  2. BPH with LUTS -Continue tamsulosin 0.4 mg daily   3.  High risk hematuria - work up completed 03/2019 -hypervascular prostate - No reports of gross hematuria  - UA neg for micro heme     No follow-ups on file.  Zara Council, PA-C  [facility] [address] [place], [place] [address] [phone]

## 2021-10-03 ENCOUNTER — Ambulatory Visit (INDEPENDENT_AMBULATORY_CARE_PROVIDER_SITE_OTHER): Payer: Medicare Other | Admitting: Urology

## 2021-10-03 ENCOUNTER — Encounter: Payer: Self-pay | Admitting: Urology

## 2021-10-03 VITALS — BP 131/79 | HR 59 | Ht 70.0 in | Wt 215.0 lb

## 2021-10-03 DIAGNOSIS — Z8546 Personal history of malignant neoplasm of prostate: Secondary | ICD-10-CM

## 2021-10-03 DIAGNOSIS — C61 Malignant neoplasm of prostate: Secondary | ICD-10-CM

## 2021-10-03 DIAGNOSIS — N401 Enlarged prostate with lower urinary tract symptoms: Secondary | ICD-10-CM

## 2021-10-03 DIAGNOSIS — R319 Hematuria, unspecified: Secondary | ICD-10-CM | POA: Diagnosis not present

## 2021-10-03 DIAGNOSIS — R3129 Other microscopic hematuria: Secondary | ICD-10-CM | POA: Diagnosis not present

## 2021-10-03 DIAGNOSIS — N138 Other obstructive and reflux uropathy: Secondary | ICD-10-CM | POA: Diagnosis not present

## 2021-10-03 LAB — MICROSCOPIC EXAMINATION

## 2021-10-03 LAB — URINALYSIS, COMPLETE
Bilirubin, UA: NEGATIVE
Glucose, UA: NEGATIVE
Ketones, UA: NEGATIVE
Leukocytes,UA: NEGATIVE
Nitrite, UA: NEGATIVE
Protein,UA: NEGATIVE
Specific Gravity, UA: 1.02 (ref 1.005–1.030)
Urobilinogen, Ur: 0.2 mg/dL (ref 0.2–1.0)
pH, UA: 5.5 (ref 5.0–7.5)

## 2021-10-15 ENCOUNTER — Ambulatory Visit (INDEPENDENT_AMBULATORY_CARE_PROVIDER_SITE_OTHER): Payer: Medicare Other | Admitting: Family Medicine

## 2021-10-15 ENCOUNTER — Encounter: Payer: Self-pay | Admitting: Family Medicine

## 2021-10-15 VITALS — BP 113/68 | HR 55 | Temp 97.9°F | Wt 216.8 lb

## 2021-10-15 DIAGNOSIS — N401 Enlarged prostate with lower urinary tract symptoms: Secondary | ICD-10-CM | POA: Diagnosis not present

## 2021-10-15 DIAGNOSIS — I10 Essential (primary) hypertension: Secondary | ICD-10-CM

## 2021-10-15 DIAGNOSIS — I700 Atherosclerosis of aorta: Secondary | ICD-10-CM

## 2021-10-15 DIAGNOSIS — K922 Gastrointestinal hemorrhage, unspecified: Secondary | ICD-10-CM

## 2021-10-15 DIAGNOSIS — J42 Unspecified chronic bronchitis: Secondary | ICD-10-CM

## 2021-10-15 DIAGNOSIS — F419 Anxiety disorder, unspecified: Secondary | ICD-10-CM

## 2021-10-15 DIAGNOSIS — R454 Irritability and anger: Secondary | ICD-10-CM | POA: Diagnosis not present

## 2021-10-15 DIAGNOSIS — C61 Malignant neoplasm of prostate: Secondary | ICD-10-CM

## 2021-10-15 DIAGNOSIS — E785 Hyperlipidemia, unspecified: Secondary | ICD-10-CM

## 2021-10-15 DIAGNOSIS — N138 Other obstructive and reflux uropathy: Secondary | ICD-10-CM

## 2021-10-15 LAB — URINALYSIS, ROUTINE W REFLEX MICROSCOPIC
Bilirubin, UA: NEGATIVE
Glucose, UA: NEGATIVE
Ketones, UA: NEGATIVE
Leukocytes,UA: NEGATIVE
Nitrite, UA: NEGATIVE
Protein,UA: NEGATIVE
Specific Gravity, UA: 1.02 (ref 1.005–1.030)
Urobilinogen, Ur: 0.2 mg/dL (ref 0.2–1.0)
pH, UA: 7 (ref 5.0–7.5)

## 2021-10-15 LAB — MICROSCOPIC EXAMINATION
Bacteria, UA: NONE SEEN
Epithelial Cells (non renal): NONE SEEN /hpf (ref 0–10)

## 2021-10-15 MED ORDER — BACLOFEN 10 MG PO TABS: 10.0000 mg | ORAL_TABLET | Freq: Every day | ORAL | 0 refills | Status: DC

## 2021-10-15 MED ORDER — LOSARTAN POTASSIUM 100 MG PO TABS
ORAL_TABLET | ORAL | 1 refills | Status: DC
Start: 2021-10-15 — End: 2022-04-22

## 2021-10-15 MED ORDER — TAMSULOSIN HCL 0.4 MG PO CAPS: 0.4000 mg | ORAL_CAPSULE | Freq: Every day | ORAL | 3 refills | Status: DC

## 2021-10-15 MED ORDER — SERTRALINE HCL 25 MG PO TABS: 25.0000 mg | ORAL_TABLET | Freq: Every day | ORAL | 3 refills | Status: DC

## 2021-10-15 MED ORDER — HYDROCHLOROTHIAZIDE 25 MG PO TABS
ORAL_TABLET | ORAL | 1 refills | Status: DC
Start: 2021-10-15 — End: 2022-04-22

## 2021-10-15 MED ORDER — CARVEDILOL 25 MG PO TABS
25.0000 mg | ORAL_TABLET | Freq: Two times a day (BID) | ORAL | 1 refills | Status: DC
Start: 2021-10-15 — End: 2022-04-22

## 2021-10-15 MED ORDER — PANTOPRAZOLE SODIUM 40 MG PO TBEC: 40.0000 mg | DELAYED_RELEASE_TABLET | Freq: Every day | ORAL | 1 refills | Status: DC

## 2021-10-15 MED ORDER — IRON 325 (65 FE) MG PO TABS
1.0000 | ORAL_TABLET | Freq: Every day | ORAL | 3 refills | Status: DC
Start: 2021-10-15 — End: 2023-05-01

## 2021-10-15 MED ORDER — AMLODIPINE BESYLATE 10 MG PO TABS: 10.0000 mg | ORAL_TABLET | Freq: Every day | ORAL | 1 refills | Status: DC

## 2021-10-15 NOTE — Progress Notes (Unsigned)
BP 113/68   Pulse (!) 55   Temp 97.9 F (36.6 C)   Wt 216 lb 12.8 oz (98.3 kg)   SpO2 98%   BMI 31.11 kg/m    Subjective:    Patient ID: Jonathan Burns, male    DOB: 09-May-1945, 76 y.o.   MRN: [id number]  HPI: Jonathan Burns is a 76 y.o. male  Chief Complaint  Patient presents with   Hypertension   Benign Prostatic Hypertrophy   Aortic atherosclerosis    Hyperlipidemia   Irritable    Patient states he is short fused, lost his wife about a year ago.    ANXIETY/IRRITABILITY Duration: about a year Status:uncontrolled Anxious mood: yes  Excessive worrying: no Irritability: yes  Sweating: no Nausea: no Palpitations:no Hyperventilation: no Panic attacks: no Agoraphobia: no  Obscessions/compulsions: no Depressed mood: no    10/15/2021    8:38 AM 06/12/2021    8:41 AM 04/17/2021    8:03 AM 10/16/2020    8:35 AM 05/29/2020    8:21 AM  Depression screen PHQ 2/9  Decreased Interest 0 0 0 0 0  Down, Depressed, Hopeless 0 0 0 0 0  PHQ - 2 Score 0 0 0 0 0  Altered sleeping 0 0 0 0   Tired, decreased energy 0 0 1 0   Change in appetite 0 0 0 0   Feeling bad or failure about yourself  0 0 0 0   Trouble concentrating 0 0 0 0   Moving slowly or fidgety/restless 0 0 0 0   Suicidal thoughts 0 0 0 0   PHQ-9 Score 0 0 1 0   Difficult doing work/chores Not difficult at all Not difficult at all Not difficult at all Not difficult at all    Anhedonia: no Weight changes: no Insomnia: no   Hypersomnia: no Fatigue/loss of energy: no Feelings of worthlessness: no Feelings of guilt: no Impaired concentration/indecisiveness: no Suicidal ideations: no  Crying spells: no Recent Stressors/Life Changes: yes   Relationship problems: no   Family stress: yes     Financial stress: no    Job stress: no    Recent death/loss: yes  HYPERTENSION / HYPERLIPIDEMIA Satisfied with current treatment? yes Duration of hypertension: chronic BP monitoring frequency: not checking BP medication  side effects: no Past BP meds: losartan, carvedilol, amlodipine, hctz Duration of hyperlipidemia: chronic Cholesterol supplements: none Past cholesterol medications: none Medication compliance: excellent compliance Aspirin: no Recent stressors: no Recurrent headaches: no Visual changes: no Palpitations: no Dyspnea: no Chest pain: no Lower extremity edema: no Dizzy/lightheaded: no  Relevant past medical, surgical, family and social history reviewed and updated as indicated. Interim medical history since our last visit reviewed. Allergies and medications reviewed and updated.  Review of Systems  Constitutional: Negative.   Respiratory: Negative.    Cardiovascular: Negative.   Gastrointestinal: Negative.   Musculoskeletal: Negative.   Skin: Negative.   Neurological: Negative.   Psychiatric/Behavioral:  Positive for agitation. Negative for behavioral problems, confusion, decreased concentration, dysphoric mood, hallucinations, self-injury, sleep disturbance and suicidal ideas. The patient is nervous/anxious. The patient is not hyperactive.     Per HPI unless specifically indicated above     Objective:    BP 113/68   Pulse (!) 55   Temp 97.9 F (36.6 C)   Wt 216 lb 12.8 oz (98.3 kg)   SpO2 98%   BMI 31.11 kg/m   Wt Readings from Last 3 Encounters:  10/15/21 216 lb 12.8 oz (98.3  kg)  10/03/21 215 lb (97.5 kg)  04/17/21 217 lb (98.4 kg)    Physical Exam Vitals and nursing note reviewed.  Constitutional:      General: He is not in acute distress.    Appearance: Normal appearance. He is obese. He is not ill-appearing, toxic-appearing or diaphoretic.  HENT:     Head: Normocephalic and atraumatic.     Right Ear: External ear normal.     Left Ear: External ear normal.     Nose: Nose normal.     Mouth/Throat:     Mouth: Mucous membranes are moist.     Pharynx: Oropharynx is clear.  Eyes:     General: No scleral icterus.       Right eye: No discharge.        Left  eye: No discharge.     Extraocular Movements: Extraocular movements intact.     Conjunctiva/sclera: Conjunctivae normal.     Pupils: Pupils are equal, round, and reactive to light.  Cardiovascular:     Rate and Rhythm: Normal rate and regular rhythm.     Pulses: Normal pulses.     Heart sounds: Normal heart sounds. No murmur heard.    No friction rub. No gallop.  Pulmonary:     Effort: Pulmonary effort is normal. No respiratory distress.     Breath sounds: Normal breath sounds. No stridor. No wheezing, rhonchi or rales.  Chest:     Chest wall: No tenderness.  Musculoskeletal:        General: Normal range of motion.     Cervical back: Normal range of motion and neck supple.  Skin:    General: Skin is warm and dry.     Capillary Refill: Capillary refill takes less than 2 seconds.     Coloration: Skin is not jaundiced or pale.     Findings: No bruising, erythema, lesion or rash.  Neurological:     General: No focal deficit present.     Mental Status: He is alert and oriented to person, place, and time. Mental status is at baseline.  Psychiatric:        Mood and Affect: Mood normal.        Behavior: Behavior normal.        Thought Content: Thought content normal.        Judgment: Judgment normal.     Results for orders placed or performed in visit on 10/15/21  Microscopic Examination   Urine  Result Value Ref Range   WBC, UA 0-5 0 - 5 /hpf   RBC, Urine 0-2 0 - 2 /hpf   Epithelial Cells (non renal) None seen 0 - 10 /hpf   Bacteria, UA None seen None seen/Few  Comprehensive metabolic panel  Result Value Ref Range   Glucose 109 (H) 70 - 99 mg/dL   BUN 16 8 - 27 mg/dL   Creatinine, Ser 0.96 0.76 - 1.27 mg/dL   eGFR 81 >59 mL/min/1.73   BUN/Creatinine Ratio 17 10 - 24   Sodium 138 134 - 144 mmol/L   Potassium 4.1 3.5 - 5.2 mmol/L   Chloride 99 96 - 106 mmol/L   CO2 23 20 - 29 mmol/L   Calcium 9.6 8.6 - 10.2 mg/dL   Total Protein 6.5 6.0 - 8.5 g/dL   Albumin 4.4 3.8 - 4.8  g/dL   Globulin, Total 2.1 1.5 - 4.5 g/dL   Albumin/Globulin Ratio 2.1 1.2 - 2.2   Bilirubin Total 0.3 0.0 - 1.2 mg/dL  Alkaline Phosphatase 65 44 - 121 IU/L   AST 18 0 - 40 IU/L   ALT 17 0 - 44 IU/L  CBC with Differential/Platelet  Result Value Ref Range   WBC 5.6 3.4 - 10.8 x10E3/uL   RBC 4.42 4.14 - 5.80 x10E6/uL   Hemoglobin 14.4 13.0 - 17.7 g/dL   Hematocrit 42.2 37.5 - 51.0 %   MCV 96 79 - 97 fL   MCH 32.6 26.6 - 33.0 pg   MCHC 34.1 31.5 - 35.7 g/dL   RDW 13.2 11.6 - 15.4 %   Platelets 211 150 - 450 x10E3/uL   Neutrophils 74 Not Estab. %   Lymphs 11 Not Estab. %   Monocytes 12 Not Estab. %   Eos 3 Not Estab. %   Basos 0 Not Estab. %   Neutrophils Absolute 4.1 1.4 - 7.0 x10E3/uL   Lymphocytes Absolute 0.6 (L) 0.7 - 3.1 x10E3/uL   Monocytes Absolute 0.7 0.1 - 0.9 x10E3/uL   EOS (ABSOLUTE) 0.2 0.0 - 0.4 x10E3/uL   Basophils Absolute 0.0 0.0 - 0.2 x10E3/uL   Immature Granulocytes 0 Not Estab. %   Immature Grans (Abs) 0.0 0.0 - 0.1 x10E3/uL  Lipid Panel w/o Chol/HDL Ratio  Result Value Ref Range   Cholesterol, Total 196 100 - 199 mg/dL   Triglycerides 97 0 - 149 mg/dL   HDL 77 >39 mg/dL   VLDL Cholesterol Cal 17 5 - 40 mg/dL   LDL Chol Calc (NIH) 102 (H) 0 - 99 mg/dL  PSA  Result Value Ref Range   Prostate Specific Ag, Serum <0.1 0.0 - 4.0 ng/mL  TSH  Result Value Ref Range   TSH 2.910 0.450 - 4.500 uIU/mL  Urinalysis, Routine w reflex microscopic  Result Value Ref Range   Specific Gravity, UA 1.020 1.005 - 1.030   pH, UA 7.0 5.0 - 7.5   Color, UA Yellow Yellow   Appearance Ur Clear Clear   Leukocytes,UA Negative Negative   Protein,UA Negative Negative/Trace   Glucose, UA Negative Negative   Ketones, UA Negative Negative   RBC, UA Trace (A) Negative   Bilirubin, UA Negative Negative   Urobilinogen, Ur 0.2 0.2 - 1.0 mg/dL   Nitrite, UA Negative Negative   Microscopic Examination See below:   Ferritin  Result Value Ref Range   Ferritin 95 30 - 400 ng/mL   Iron Binding Cap (TIBC)([facility])  Result Value Ref Range   Total Iron Binding Capacity 297 250 - 450 ug/dL   UIBC 216 111 - 343 ug/dL   Iron 81 38 - 169 ug/dL   Iron Saturation 27 15 - 55 %      Assessment & Plan:   Problem List Items Addressed This Visit       Cardiovascular and Mediastinum   HTN (hypertension), benign    Under good control on current regimen. Continue current regimen. Continue to monitor. Call with any concerns. Refills given. Labs drawn today.       Relevant Medications   losartan (COZAAR) 100 MG tablet   hydrochlorothiazide (HYDRODIURIL) 25 MG tablet   carvedilol (COREG) 25 MG tablet   amLODipine (NORVASC) 10 MG tablet   Other Relevant Orders   Comprehensive metabolic panel (Completed)   CBC with Differential/Platelet (Completed)   TSH (Completed)   Urinalysis, Routine w reflex microscopic (Completed)   Aortic atherosclerosis (HCC)    Will keep BP and cholesterol under good control. Continue to monitor. Call with any concerns.       Relevant  Medications   losartan (COZAAR) 100 MG tablet   hydrochlorothiazide (HYDRODIURIL) 25 MG tablet   carvedilol (COREG) 25 MG tablet   amLODipine (NORVASC) 10 MG tablet   Other Relevant Orders   Comprehensive metabolic panel (Completed)   CBC with Differential/Platelet (Completed)   Lipid Panel w/o Chol/HDL Ratio (Completed)     Respiratory   COPD (chronic obstructive pulmonary disease) (HCC)    Under good control on current regimen. Continue current regimen. Continue to monitor. Call with any concerns. Refills given. Labs drawn today.       Relevant Orders   Comprehensive metabolic panel (Completed)   CBC with Differential/Platelet (Completed)     Digestive   Chronic GI bleeding    Rechecking labs today. Await results. Treat as needed.       Relevant Medications   pantoprazole (PROTONIX) 40 MG tablet   Other Relevant Orders   Comprehensive metabolic panel (Completed)   CBC with  Differential/Platelet (Completed)   Ferritin (Completed)   Iron Binding Cap (TIBC)([facility]) (Completed)     Genitourinary   BPH with obstruction/lower urinary tract symptoms    Rechecking labs today. Await results. Treat as needed.       Relevant Medications   tamsulosin (FLOMAX) 0.4 MG CAPS capsule   Other Relevant Orders   Comprehensive metabolic panel (Completed)   CBC with Differential/Platelet (Completed)   PSA (Completed)   Urinalysis, Routine w reflex microscopic (Completed)   Prostate cancer ([facility])    Rechecking labs today. Await results. Treat as needed.       Relevant Orders   Comprehensive metabolic panel (Completed)   CBC with Differential/Platelet (Completed)   PSA (Completed)     Other   Hyperlipidemia    Under good control on current regimen. Continue current regimen. Continue to monitor. Call with any concerns. Refills given. Labs drawn today.        Relevant Medications   losartan (COZAAR) 100 MG tablet   hydrochlorothiazide (HYDRODIURIL) 25 MG tablet   carvedilol (COREG) 25 MG tablet   amLODipine (NORVASC) 10 MG tablet   Other Relevant Orders   Comprehensive metabolic panel (Completed)   CBC with Differential/Platelet (Completed)   Lipid Panel w/o Chol/HDL Ratio (Completed)   Acute anxiety    Not doing well. Will start low dose sertraline and recheck 1 month. Call with any concerns.       Relevant Medications   sertraline (ZOLOFT) 25 MG tablet   Other Visit Diagnoses     Irritability    -  Primary   Not doing well. Will start low dose sertraline and recheck 1 month. Call with any concerns.    Essential hypertension       Relevant Medications   losartan (COZAAR) 100 MG tablet   hydrochlorothiazide (HYDRODIURIL) 25 MG tablet   carvedilol (COREG) 25 MG tablet   amLODipine (NORVASC) 10 MG tablet        Follow up plan: Return in about 4 weeks (around 11/12/2021).

## 2021-10-16 ENCOUNTER — Encounter: Payer: Self-pay | Admitting: Family Medicine

## 2021-10-16 LAB — FERRITIN: Ferritin: 95 ng/mL (ref 30–400)

## 2021-10-16 LAB — CBC WITH DIFFERENTIAL/PLATELET
Basophils Absolute: 0 10*3/uL (ref 0.0–0.2)
Basos: 0 %
EOS (ABSOLUTE): 0.2 10*3/uL (ref 0.0–0.4)
Eos: 3 %
Hematocrit: 42.2 % (ref 37.5–51.0)
Hemoglobin: 14.4 g/dL (ref 13.0–17.7)
Immature Grans (Abs): 0 10*3/uL (ref 0.0–0.1)
Immature Granulocytes: 0 %
Lymphocytes Absolute: 0.6 10*3/uL — ABNORMAL LOW (ref 0.7–3.1)
Lymphs: 11 %
MCH: 32.6 pg (ref 26.6–33.0)
MCHC: 34.1 g/dL (ref 31.5–35.7)
MCV: 96 fL (ref 79–97)
Monocytes Absolute: 0.7 10*3/uL (ref 0.1–0.9)
Monocytes: 12 %
Neutrophils Absolute: 4.1 10*3/uL (ref 1.4–7.0)
Neutrophils: 74 %
Platelets: 211 10*3/uL (ref 150–450)
RBC: 4.42 x10E6/uL (ref 4.14–5.80)
RDW: 13.2 % (ref 11.6–15.4)
WBC: 5.6 10*3/uL (ref 3.4–10.8)

## 2021-10-16 LAB — COMPREHENSIVE METABOLIC PANEL
ALT: 17 IU/L (ref 0–44)
AST: 18 IU/L (ref 0–40)
Albumin/Globulin Ratio: 2.1 (ref 1.2–2.2)
Albumin: 4.4 g/dL (ref 3.8–4.8)
Alkaline Phosphatase: 65 IU/L (ref 44–121)
BUN/Creatinine Ratio: 17 (ref 10–24)
BUN: 16 mg/dL (ref 8–27)
Bilirubin Total: 0.3 mg/dL (ref 0.0–1.2)
CO2: 23 mmol/L (ref 20–29)
Calcium: 9.6 mg/dL (ref 8.6–10.2)
Chloride: 99 mmol/L (ref 96–106)
Creatinine, Ser: 0.96 mg/dL (ref 0.76–1.27)
Globulin, Total: 2.1 g/dL (ref 1.5–4.5)
Glucose: 109 mg/dL — ABNORMAL HIGH (ref 70–99)
Potassium: 4.1 mmol/L (ref 3.5–5.2)
Sodium: 138 mmol/L (ref 134–144)
Total Protein: 6.5 g/dL (ref 6.0–8.5)
eGFR: 81 mL/min/{1.73_m2} (ref >59)

## 2021-10-16 LAB — LIPID PANEL W/O CHOL/HDL RATIO
Cholesterol, Total: 196 mg/dL (ref 100–199)
HDL: 77 mg/dL (ref >39)
LDL Chol Calc (NIH): 102 mg/dL — ABNORMAL HIGH (ref 0–99)
Triglycerides: 97 mg/dL (ref 0–149)
VLDL Cholesterol Cal: 17 mg/dL (ref 5–40)

## 2021-10-16 LAB — IRON AND TIBC
Iron Saturation: 27 % (ref 15–55)
Iron: 81 ug/dL (ref 38–169)
Total Iron Binding Capacity: 297 ug/dL (ref 250–450)
UIBC: 216 ug/dL (ref 111–343)

## 2021-10-16 LAB — PSA: Prostate Specific Ag, Serum: <0.1 ng/mL (ref 0.0–4.0)

## 2021-10-16 LAB — TSH: TSH: 2.91 u[IU]/mL (ref 0.450–4.500)

## 2021-10-16 NOTE — Assessment & Plan Note (Signed)
Under good control on current regimen. Continue current regimen. Continue to monitor. Call with any concerns. Refills given. Labs drawn today.   

## 2021-10-16 NOTE — Assessment & Plan Note (Signed)
Rechecking labs today. Await results. Treat as needed.  °

## 2021-10-16 NOTE — Assessment & Plan Note (Signed)
Not doing well. Will start low dose sertraline and recheck 1 month. Call with any concerns.

## 2021-10-16 NOTE — Assessment & Plan Note (Signed)
Will keep BP and cholesterol under good control. Continue to monitor. Call with any concerns.

## 2021-10-17 ENCOUNTER — Ambulatory Visit
Admission: RE | Admit: 2021-10-17 | Discharge: 2021-10-17 | Disposition: A | Discharge status: Home / Self Care | Payer: Medicare Other | Source: Ambulatory Visit | Attending: Urology | Admitting: Urology

## 2021-10-17 DIAGNOSIS — R3129 Other microscopic hematuria: Secondary | ICD-10-CM | POA: Diagnosis not present

## 2021-10-17 DIAGNOSIS — N281 Cyst of kidney, acquired: Secondary | ICD-10-CM | POA: Diagnosis not present

## 2021-10-17 DIAGNOSIS — K8020 Calculus of gallbladder without cholecystitis without obstruction: Secondary | ICD-10-CM | POA: Diagnosis not present

## 2021-10-17 DIAGNOSIS — N2889 Other specified disorders of kidney and ureter: Secondary | ICD-10-CM | POA: Diagnosis not present

## 2021-10-17 MED ORDER — IOHEXOL 300 MG/ML  SOLN: 125.0000 mL | Freq: Once | INTRAMUSCULAR | Status: DC | PRN

## 2021-10-18 ENCOUNTER — Other Ambulatory Visit: Payer: Self-pay

## 2021-10-18 ENCOUNTER — Telehealth: Payer: Self-pay

## 2021-10-18 DIAGNOSIS — N200 Calculus of kidney: Secondary | ICD-10-CM

## 2021-10-18 NOTE — Telephone Encounter (Signed)
-----   Message from Nori Riis, PA-C sent at 10/18/2021  9:20 AM EDT ----- I have sent Mr. Hoogendoorn a MyChart note about his CT scan results.  I need to see him next week for a KUB and symptom recheck.  If he is having  a lot of pain, I need to see him today or tomorrow.

## 2021-10-18 NOTE — Telephone Encounter (Signed)
Pt states he has not experienced any pain at all. Appt made for 10/24/21 with KUB prior. Orders placed and pt advised.

## 2021-10-19 ENCOUNTER — Ambulatory Visit: Payer: Medicare Other | Admitting: Radiation Oncology

## 2021-10-23 ENCOUNTER — Ambulatory Visit
Admission: RE | Admit: 2021-10-23 | Discharge: 2021-10-23 | Disposition: A | Discharge status: Home / Self Care | Payer: Medicare Other | Source: Ambulatory Visit | Attending: Urology | Admitting: Urology

## 2021-10-23 ENCOUNTER — Ambulatory Visit
Admission: RE | Admit: 2021-10-23 | Discharge: 2021-10-23 | Disposition: A | Discharge status: Home / Self Care | Payer: Medicare Other | Attending: Urology | Admitting: Urology

## 2021-10-23 DIAGNOSIS — I878 Other specified disorders of veins: Secondary | ICD-10-CM | POA: Diagnosis not present

## 2021-10-23 DIAGNOSIS — N200 Calculus of kidney: Secondary | ICD-10-CM | POA: Insufficient documentation

## 2021-10-23 DIAGNOSIS — K8020 Calculus of gallbladder without cholecystitis without obstruction: Secondary | ICD-10-CM | POA: Diagnosis not present

## 2021-10-23 DIAGNOSIS — Z87442 Personal history of urinary calculi: Secondary | ICD-10-CM | POA: Diagnosis not present

## 2021-10-23 DIAGNOSIS — M419 Scoliosis, unspecified: Secondary | ICD-10-CM | POA: Diagnosis not present

## 2021-10-23 NOTE — Progress Notes (Unsigned)
05/06/2018 9:43 AM   Jonathan Burns Mar 01, 1945 [id number]  Referring provider: Valerie Roys, DO [place],  [place] [address]  Urological history 1.  Prostate cancer -PSA <0.1 09/2021 -high risk prostate cancer - underwent a biopsy for an elevated PSA of 6.0, and a right-sided prostate nodule. TRUS volume 33 cc.  Prostate biopsy revealed 12 of 12 cores positive including lesions up to Gleason 4+4 involving 80% of the tissue on the right lateral mid core 03/2017 - completed 07/2017 IM RT radiation therapy to both his prostate and pelvic nodes - taking calcium and Vitamin D  2. LU TS -I PSS 1/1 -tamsulosin 0.4 mg prn  3.  High risk hematuria - former smoker - CT with contrast 12/2018  noted The adrenal glands are unremarkable. There is no hydronephrosis on either side. There is symmetric enhancement and excretion of contrast by both kidneys. Subcentimeter bilateral renal hypodense foci are too small to characterize. The visualized ureters appear unremarkable. The urinary bladder is collapsed. There is apparent diffuse thickening of the bladder wall which may be partly related to underdistention. Cystitis is not excluded. Correlation with urinalysis recommended.  Partially visualized loculated appearing moderate-sized left pleural effusion with findings concerning for empyema - for which he was hospitalized -Cysto 03/29/2019 with Dr. Bernardo Heater Mild lateral lobe enlargement prostate, mild hypervascularity prostate -CT urogram (10/2021) - There is a tiny 2 mm stone within the right UVJ without associated hydroureter or ureteral lithiasis.  Bilateral punctate nonobstructing renal calculi. -no reports of gross heme -UA negative for micro heme  Chief Complaint  Patient presents with   Hematuria   Nephrolithiasis    HPI: Jonathan Burns is a 76 y.o. male who presents today for follow up for a right UVJ stone found on recent CT urogram.    CTU (09/2021)- bilateral renal cysts, bilateral  punctate stones and right 2 mm UVJ stone.  In the chest - Indeterminate part solid nodule within the central aspect of the right lower lobe is new from previous exam. This measures 1.9 x 1.2 cm within internal solid component measuring 6 mm  UA (10/2021) negative for micro heme  KUB (10/2021)- 2 mm right UVJ stone not visualized.  He had an episode on Sunday where he had right inguinal discomfort which then traveled to the mid suprapubic region that lasted for few hours.  He then felt a tingling sensation when voiding and he has been pain-free since.  He did not see a distinct stone fragment pass.  Patient denies any modifying or aggravating factors.  Patient denies any gross hematuria, dysuria or suprapubic/flank pain.  Patient denies any fevers, chills, nausea or vomiting.    PMH: Past Medical History:  Diagnosis Date   Anemia    BPH (benign prostatic hypertrophy)    Cancer (HCC)    Chronic GI bleeding    COPD (chronic obstructive pulmonary disease) ([place])    Hyperlipidemia    Hypertension    Prostate nodule    PUD (peptic ulcer disease)    Rising PSA level     Surgical History: Past Surgical History:  Procedure Laterality Date   COLONOSCOPY  2015   ESOPHAGOGASTRODUODENOSCOPY  2015   FLEXIBLE BRONCHOSCOPY Left 01/20/2019   Procedure: FLEXIBLE BRONCHOSCOPY;  Surgeon: Nestor Lewandowsky, MD;  Location: [facility] ORS;  Service: Thoracic;  Laterality: Left;   THORACOTOMY Left 01/20/2019   Procedure: CONVERTED TO THORACOTOMY MAJOR;  Surgeon: Nestor Lewandowsky, MD;  Location: [facility];  Service: Thoracic;  Laterality: Left;   VIDEO ASSISTED THORACOSCOPY (VATS)/THOROCOTOMY Left 01/20/2019   Procedure: VIDEO ASSISTED THORACOSCOPY (VATS)/THOROCOTOMY;  Surgeon: Nestor Lewandowsky, MD;  Location: [facility];  Service: Thoracic;  Laterality: Left;    Home Medications:  Current Outpatient Medications on File Prior to Visit  Medication Sig Dispense Refill   amLODipine (NORVASC) 10 MG tablet Take 1 tablet (10 mg  total) by mouth daily. 90 tablet 1   baclofen (LIORESAL) 10 MG tablet Take 1 tablet (10 mg total) by mouth at bedtime. 30 tablet 0   carvedilol (COREG) 25 MG tablet Take 1 tablet (25 mg total) by mouth 2 (two) times daily with a meal. 180 tablet 1   Ferrous Sulfate (IRON) 325 (65 Fe) MG TABS Take 1 tablet (325 mg total) by mouth daily. 90 tablet 3   hydrochlorothiazide (HYDRODIURIL) 25 MG tablet 1 tab daily 90 tablet 1   LORazepam (ATIVAN) 0.5 MG tablet Take 1 tablet (0.5 mg total) by mouth daily as needed for anxiety. 30 tablet 0   losartan (COZAAR) 100 MG tablet Take 1 tablet (100 mg total) by mouth daily. 90 tablet 1   Multiple Vitamin (MULTIVITAMIN) tablet Take 1 tablet by mouth daily.     Multiple Vitamins-Minerals (PRESERVISION AREDS PO) Take 1 tablet by mouth 2 (two) times daily.     Omega-3 1000 MG CAPS Take 1 capsule by mouth daily.     pantoprazole (PROTONIX) 40 MG tablet Take 1 tablet (40 mg total) by mouth daily. 90 tablet 1   sertraline (ZOLOFT) 25 MG tablet Take 1 tablet (25 mg total) by mouth daily. 30 tablet 3   tamsulosin (FLOMAX) 0.4 MG CAPS capsule Take 1 capsule (0.4 mg total) by mouth daily. 90 capsule 3   valACYclovir (VALTREX) 500 MG tablet Take 500 mg by mouth daily.     No current facility-administered medications on file prior to visit.      Allergies: No Known Allergies  Family History: Family History  Problem Relation Age of Onset   Cancer Mother        breast   Diabetes Mother    Aneurysm Father    Kidney disease Neg Hx    Prostate cancer Neg Hx    Kidney cancer Neg Hx     Social History:  reports that he quit smoking about 35 years ago. His smoking use included cigarettes. He has a 30.00 pack-year smoking history. He has never used smokeless tobacco. He reports current alcohol use of about 2.0 standard drinks of alcohol per week. He reports that he does not use drugs.  ROS: For pertinent review of systems please refer to history of present  illness  Physical Exam: BP 117/70   Pulse (!) 52   Ht _0  (1.778 m)   Wt 210 lb (95.3 kg)   BMI 30.13 kg/m   Constitutional:  Well nourished. Alert and oriented, No acute distress. HEENT: [place] AT, moist mucus membranes.  Trachea midline Cardiovascular: No clubbing, cyanosis, or edema. Respiratory: Normal respiratory effort, no increased work of breathing. Neurologic: Grossly intact, no focal deficits, moving all 4 extremities. Psychiatric: Normal mood and affect.   Laboratory Data: Urinalysis See Epic and HPI I have reviewed the labs.  Pertinent Imaging CLINICAL DATA:  Microscopic hematuria. History of prostate cancer. Former smoker.   EXAM: CT ABDOMEN AND PELVIS WITHOUT AND WITH CONTRAST   TECHNIQUE: Multidetector CT imaging of the abdomen and pelvis was performed following the standard protocol before and following the bolus administration of intravenous  contrast.   RADIATION DOSE REDUCTION: This exam was performed according to the departmental dose-optimization program which includes automated exposure control, adjustment of the mA and/or kV according to patient size and/or use of iterative reconstruction technique.   CONTRAST:  125 cc Omnipaque 300   COMPARISON:  CT AP 01/13/2019   FINDINGS: Lower chest: Changes of centrilobular and paraseptal emphysema identified. Bandlike scarring noted within the posterior right lung base and right middle lobe. Indeterminate part solid nodule within the central aspect of the right lower lobe is new from previous exam. This measures 1.9 x 1.2 cm within internal solid component measuring 6 mm, image 10/3.   Hepatobiliary: Anterior dome of liver cyst measures 3.6 cm, image 13/9. Unchanged from prior exam. Unchanged 5 mm, too small to reliably characterize low-density structure within right lobe of liver, image 37/9.   1.5 cm gallstone identified. No gallbladder wall thickening or inflammation. No bile duct dilatation.    Pancreas: Unremarkable. No pancreatic ductal dilatation or surrounding inflammatory changes.   Spleen: Normal in size without focal abnormality.   Adrenals/Urinary Tract: Normal adrenal glands.   Punctate stone within the interpolar collecting system of the left kidney is identified, image 28/2. Punctate stone within upper pole of the right kidney is noted, image 27/2. No hydronephrosis or hydroureter. Within the right UVJ there is a tiny stone which measures 2 mm without associated hydroureter or ureteral lithiasis, image 71/2. No additional urinary tract calculi identified.   Nonenhancing cyst within the lateral cortex of the interpolar left kidney measures 1.1 cm compatible with a benign Bosniak class 2 kidney lesion, image 29/9. Within the medial cortex of the interpolar right kidney there is a small simple cyst measuring 1.1 cm, image 30/9. This is compatible with a benign Bosniak class 1. No follow-up imaging recommended. No suspicious enhancing kidney lesions. There is symmetric opacification of the collecting systems an ureters on the delayed images. No suspicious filling defects identified. No focal bladder lesion.   Stomach/Bowel: Moderate hiatal hernia. No dilated loops of large or small bowel. Scattered colonic diverticula without signs of acute diverticulitis.   Vascular/Lymphatic: Aortic atherosclerosis. No aneurysm. No abdominopelvic adenopathy.   Reproductive: Prostate gland calcifications. The gland appears normal in size.   Other: No ascites or focal fluid collections. Small fat containing umbilical hernia. There also bilateral fat containing inguinal hernias.   Musculoskeletal: No suspicious bone lesions. Multilevel lumbar degenerative disc disease. The   IMPRESSION: 1. There is a tiny 2 mm stone within the right UVJ without associated hydroureter or ureteral lithiasis. 2. Bilateral punctate nonobstructing renal calculi. 3. Indeterminate part solid  nodule within the central aspect of the right lower lobe is new from previous exam. This measures 1.9 x 1.2 cm within internal solid component measuring 6 mm. Follow-up non-contrast CT recommended at 3-6 months to confirm persistence. If unchanged, and solid component remains <6 mm, annual CT is recommended until 5 years of stability has been established. If persistent these nodules should be considered highly suspicious if the solid component of the nodule is 6 mm or greater in size and enlarging. This recommendation follows the consensus statement: Guidelines for Management of Incidental Pulmonary Nodules Detected on CT Images: From the Fleischner Society 2017; Radiology 2017; 284:228-243. 4. Gallstone. 5. Hiatal hernia. 6. Bilateral fat containing inguinal hernias and umbilical hernia. 7. Aortic Atherosclerosis (ICD10-I70.0) and Emphysema (ICD10-J43.9).     Electronically Signed   By: Kerby Moors M.D.   On: 10/18/2021 09:04  CLINICAL DATA:  Right  kidney stone follow-up.   EXAM: ABDOMEN - 1 VIEW   COMPARISON:  CT abdomen pelvis October 17, 2021   FINDINGS: The bowel gas pattern is normal. Gallstone is identified unchanged. The previous CT noted stone in the right ureteral vesicular junction is not seen on x-ray. Pelvic phleboliths are noted. Degenerative joint changes of the spine and scoliosis of the spine are noted.   IMPRESSION: The previous CT noted stone in the right ureteral vesicular junction is not seen on x-ray.     Electronically Signed   By: Abelardo Diesel M.D.   On: 10/23/2021 12:52 I have independently reviewed the films.  See HPI.    Assessment & Plan:    1. Right UVJ stone -not seen on on recent KUB -UA negative for micro heme -Likely passed the stone on Sunday morning  2.  High risk hematuria - work up completed 03/2019 -hypervascular prostate - CTU (09/2021) - punctate stones bilaterally, benign renal cysts bilaterally and right UVJ stone -  No reports of gross hematuria  - UA negative for micro heme  3. Lung nodule -Explained findings to the patient -Repeat chest CT in 3 months   Return in about 3 months (around 01/23/2022) for chest CT .  Thai Burgueno, [place] [address], [place], [place] [address] [phone]

## 2021-10-24 ENCOUNTER — Ambulatory Visit (INDEPENDENT_AMBULATORY_CARE_PROVIDER_SITE_OTHER): Payer: Medicare Other | Admitting: Urology

## 2021-10-24 ENCOUNTER — Encounter: Payer: Self-pay | Admitting: Urology

## 2021-10-24 VITALS — BP 117/70 | HR 52 | Ht 70.0 in | Wt 210.0 lb

## 2021-10-24 DIAGNOSIS — Z87442 Personal history of urinary calculi: Secondary | ICD-10-CM | POA: Diagnosis not present

## 2021-10-24 DIAGNOSIS — R911 Solitary pulmonary nodule: Secondary | ICD-10-CM | POA: Diagnosis not present

## 2021-10-24 DIAGNOSIS — R319 Hematuria, unspecified: Secondary | ICD-10-CM | POA: Diagnosis not present

## 2021-10-24 DIAGNOSIS — N201 Calculus of ureter: Secondary | ICD-10-CM

## 2021-10-24 LAB — URINALYSIS, COMPLETE
Bilirubin, UA: NEGATIVE
Glucose, UA: NEGATIVE
Ketones, UA: NEGATIVE
Leukocytes,UA: NEGATIVE
Nitrite, UA: NEGATIVE
Protein,UA: NEGATIVE
RBC, UA: NEGATIVE
Specific Gravity, UA: 1.015 (ref 1.005–1.030)
Urobilinogen, Ur: 0.2 mg/dL (ref 0.2–1.0)
pH, UA: 7 (ref 5.0–7.5)

## 2021-10-24 LAB — MICROSCOPIC EXAMINATION
Bacteria, UA: NONE SEEN
Epithelial Cells (non renal): NONE SEEN /hpf (ref 0–10)

## 2021-10-24 MED ORDER — IOHEXOL 300 MG/ML  SOLN
125.0000 mL | Freq: Once | INTRAMUSCULAR | Status: AC | PRN
Start: 2021-10-24 — End: 2021-10-24
  Administered 2021-10-24: 125 mL via INTRAVENOUS

## 2021-11-15 ENCOUNTER — Ambulatory Visit (INDEPENDENT_AMBULATORY_CARE_PROVIDER_SITE_OTHER): Payer: Medicare Other | Admitting: Family Medicine

## 2021-11-15 ENCOUNTER — Encounter: Payer: Self-pay | Admitting: Family Medicine

## 2021-11-15 VITALS — BP 117/72 | HR 61 | Wt 216.8 lb

## 2021-11-15 DIAGNOSIS — F419 Anxiety disorder, unspecified: Secondary | ICD-10-CM

## 2021-11-15 DIAGNOSIS — Z23 Encounter for immunization: Secondary | ICD-10-CM

## 2021-11-15 MED ORDER — SERTRALINE HCL 50 MG PO TABS: 50.0000 mg | ORAL_TABLET | Freq: Every day | ORAL | 2 refills | Status: DC

## 2021-11-15 NOTE — Progress Notes (Signed)
BP 117/72   Pulse 61   Wt 216 lb 12.8 oz (98.3 kg)   SpO2 97%   BMI 31.11 kg/m    Subjective:    Patient ID: Jonathan Burns, male    DOB: September 18, 1945, 76 y.o.   MRN: [id number]  HPI: Jonathan Burns is a 76 y.o. male  Chief Complaint  Patient presents with   Anxiety    Patient states medication is working well for him, ran out 2 days ago and can tell a difference from being off of it.    ANXIETY/STRESS Duration: chronic Status:better Anxious mood: yes  Excessive worrying: no Irritability: yes  Sweating: no Nausea: no Palpitations:no Hyperventilation: no Panic attacks: no Agoraphobia: no  Obscessions/compulsions: no Depressed mood: no    11/15/2021    8:26 AM 10/15/2021    8:38 AM 06/12/2021    8:41 AM 04/17/2021    8:03 AM 10/16/2020    8:35 AM  Depression screen PHQ 2/9  Decreased Interest 0 0 0 0 0  Down, Depressed, Hopeless 1 0 0 0 0  PHQ - 2 Score 1 0 0 0 0  Altered sleeping 0 0 0 0 0  Tired, decreased energy 0 0 0 1 0  Change in appetite 0 0 0 0 0  Feeling bad or failure about yourself  0 0 0 0 0  Trouble concentrating 0 0 0 0 0  Moving slowly or fidgety/restless 0 0 0 0 0  Suicidal thoughts 0 0 0 0 0  PHQ-9 Score 1 0 0 1 0  Difficult doing work/chores Not difficult at all Not difficult at all Not difficult at all Not difficult at all Not difficult at all      11/15/2021    8:26 AM 10/15/2021    8:41 AM 04/17/2021    8:04 AM 10/16/2020    8:35 AM  GAD 7 : Generalized Anxiety Score  Nervous, Anxious, on Edge 1 0 1 0  Control/stop worrying 1 0 0 0  Worry too much - different things 0 0 1 0  Trouble relaxing 0 0 0 0  Restless 0 0 0 0  Easily annoyed or irritable 1 0 0 0  Afraid - awful might happen 0 0 0 0  Total GAD 7 Score 3 0 2 0  Anxiety Difficulty Not difficult at all Not difficult at all Not difficult at all Not difficult at all   Anhedonia: no Weight changes: no Insomnia: no   Hypersomnia: no Fatigue/loss of energy: no Feelings of worthlessness:  no Feelings of guilt: no Impaired concentration/indecisiveness: no Suicidal ideations: no  Crying spells: no Recent Stressors/Life Changes: no   Relationship problems: no   Family stress: no     Financial stress: no    Job stress: no    Recent death/loss: no  Relevant past medical, surgical, family and social history reviewed and updated as indicated. Interim medical history since our last visit reviewed. Allergies and medications reviewed and updated.  Review of Systems  Constitutional: Negative.   Respiratory: Negative.    Cardiovascular: Negative.   Gastrointestinal: Negative.   Musculoskeletal: Negative.   Neurological: Negative.   Psychiatric/Behavioral: Negative.      Per HPI unless specifically indicated above     Objective:    BP 117/72   Pulse 61   Wt 216 lb 12.8 oz (98.3 kg)   SpO2 97%   BMI 31.11 kg/m   Wt Readings from Last 3 Encounters:  11/15/21 216 lb 12.8  oz (98.3 kg)  10/24/21 210 lb (95.3 kg)  10/15/21 216 lb 12.8 oz (98.3 kg)    Physical Exam Vitals and nursing note reviewed.  Constitutional:      General: He is not in acute distress.    Appearance: Normal appearance. He is obese. He is not ill-appearing, toxic-appearing or diaphoretic.  HENT:     Head: Normocephalic and atraumatic.     Right Ear: External ear normal.     Left Ear: External ear normal.     Nose: Nose normal.     Mouth/Throat:     Mouth: Mucous membranes are moist.     Pharynx: Oropharynx is clear.  Eyes:     General: No scleral icterus.       Right eye: No discharge.        Left eye: No discharge.     Extraocular Movements: Extraocular movements intact.     Conjunctiva/sclera: Conjunctivae normal.     Pupils: Pupils are equal, round, and reactive to light.  Cardiovascular:     Rate and Rhythm: Normal rate and regular rhythm.     Pulses: Normal pulses.     Heart sounds: Normal heart sounds. No murmur heard.    No friction rub. No gallop.  Pulmonary:     Effort:  Pulmonary effort is normal. No respiratory distress.     Breath sounds: Normal breath sounds. No stridor. No wheezing, rhonchi or rales.  Chest:     Chest wall: No tenderness.  Musculoskeletal:        General: Normal range of motion.     Cervical back: Normal range of motion and neck supple.  Skin:    General: Skin is warm and dry.     Capillary Refill: Capillary refill takes less than 2 seconds.     Coloration: Skin is not jaundiced or pale.     Findings: No bruising, erythema, lesion or rash.  Neurological:     General: No focal deficit present.     Mental Status: He is alert and oriented to person, place, and time. Mental status is at baseline.  Psychiatric:        Mood and Affect: Mood normal.        Behavior: Behavior normal.        Thought Content: Thought content normal.        Judgment: Judgment normal.     Results for orders placed or performed in visit on 10/24/21  Microscopic Examination   Urine  Result Value Ref Range   WBC, UA 0-5 0 - 5 /hpf   RBC, Urine 0-2 0 - 2 /hpf   Epithelial Cells (non renal) None seen 0 - 10 /hpf   Bacteria, UA None seen None seen/Few  Urinalysis, Complete  Result Value Ref Range   Specific Gravity, UA 1.015 1.005 - 1.030   pH, UA 7.0 5.0 - 7.5   Color, UA Yellow Yellow   Appearance Ur Clear Clear   Leukocytes,UA Negative Negative   Protein,UA Negative Negative/Trace   Glucose, UA Negative Negative   Ketones, UA Negative Negative   RBC, UA Negative Negative   Bilirubin, UA Negative Negative   Urobilinogen, Ur 0.2 0.2 - 1.0 mg/dL   Nitrite, UA Negative Negative   Microscopic Examination See below:       Assessment & Plan:   Problem List Items Addressed This Visit       Other   Acute anxiety    Doing much better, but only 75% there.  Will increase to 50mg  and recheck 1 month. Call with any concerns.       Relevant Medications   sertraline (ZOLOFT) 50 MG tablet   Other Visit Diagnoses     Need for influenza vaccination     -  Primary   Relevant Orders   Flu Vaccine QUAD High Dose(Fluad) (Completed)        Follow up plan: Return in about 4 weeks (around 12/13/2021).

## 2021-11-15 NOTE — Assessment & Plan Note (Signed)
Doing much better, but only 75% there. Will increase to 50mg  and recheck 1 month. Call with any concerns.

## 2021-11-27 DIAGNOSIS — H52223 Regular astigmatism, bilateral: Secondary | ICD-10-CM | POA: Diagnosis not present

## 2021-11-27 DIAGNOSIS — H524 Presbyopia: Secondary | ICD-10-CM | POA: Diagnosis not present

## 2021-11-27 DIAGNOSIS — B0050 Herpesviral ocular disease, unspecified: Secondary | ICD-10-CM | POA: Diagnosis not present

## 2021-11-27 DIAGNOSIS — H353131 Nonexudative age-related macular degeneration, bilateral, early dry stage: Secondary | ICD-10-CM | POA: Diagnosis not present

## 2021-11-27 DIAGNOSIS — H2513 Age-related nuclear cataract, bilateral: Secondary | ICD-10-CM | POA: Diagnosis not present

## 2021-11-27 DIAGNOSIS — H33312 Horseshoe tear of retina without detachment, left eye: Secondary | ICD-10-CM | POA: Diagnosis not present

## 2021-11-27 DIAGNOSIS — H5213 Myopia, bilateral: Secondary | ICD-10-CM | POA: Diagnosis not present

## 2021-11-27 DIAGNOSIS — H43813 Vitreous degeneration, bilateral: Secondary | ICD-10-CM | POA: Diagnosis not present

## 2021-11-29 ENCOUNTER — Telehealth: Payer: Self-pay | Admitting: Radiation Oncology

## 2021-11-29 NOTE — Telephone Encounter (Signed)
Patient left VM on answering service requesting to cancel his appointment with Dr. Baruch Gouty on 10/16. He stated that he would call once back in town to reschedule.

## 2021-12-03 ENCOUNTER — Ambulatory Visit: Payer: Medicare Other | Admitting: Radiation Oncology

## 2021-12-19 ENCOUNTER — Ambulatory Visit (INDEPENDENT_AMBULATORY_CARE_PROVIDER_SITE_OTHER): Payer: Medicare Other | Admitting: Family Medicine

## 2021-12-19 ENCOUNTER — Encounter: Payer: Self-pay | Admitting: Family Medicine

## 2021-12-19 DIAGNOSIS — F419 Anxiety disorder, unspecified: Secondary | ICD-10-CM

## 2021-12-19 MED ORDER — SERTRALINE HCL 50 MG PO TABS: 50.0000 mg | ORAL_TABLET | Freq: Every day | ORAL | 1 refills | Status: DC

## 2021-12-19 NOTE — Progress Notes (Signed)
BP 122/73   Pulse (!) 51   Temp 98.1 F (36.7 C)   Wt 215 lb 14.4 oz (97.9 kg)   SpO2 97%   BMI 30.98 kg/m    Subjective:    Patient ID: Jonathan Burns, male    DOB: 04/06/1945, 76 y.o.   MRN: [id number]  HPI: Jonathan Burns is a 76 y.o. male  Chief Complaint  Patient presents with   Anxiety    Patient here for follow up, sertraline increased to 50mg  a month ago. Patient states he is doing well on current dose.   ANXIETY/STRESS Duration: months Status:better Anxious mood: no  Excessive worrying: no Irritability: no  Sweating: no Nausea: no Palpitations:no Hyperventilation: no Panic attacks: no Agoraphobia: no  Obscessions/compulsions: no Depressed mood: no    12/19/2021    9:28 AM 11/15/2021    8:26 AM 10/15/2021    8:38 AM 06/12/2021    8:41 AM 04/17/2021    8:03 AM  Depression screen PHQ 2/9  Decreased Interest 0 0 0 0 0  Down, Depressed, Hopeless 0 1 0 0 0  PHQ - 2 Score 0 1 0 0 0  Altered sleeping 0 0 0 0 0  Tired, decreased energy 0 0 0 0 1  Change in appetite 0 0 0 0 0  Feeling bad or failure about yourself  0 0 0 0 0  Trouble concentrating 0 0 0 0 0  Moving slowly or fidgety/restless 0 0 0 0 0  Suicidal thoughts 0 0 0 0 0  PHQ-9 Score 0 1 0 0 1  Difficult doing work/chores  Not difficult at all Not difficult at all Not difficult at all Not difficult at all   Anhedonia: no Weight changes: no Insomnia: no   Hypersomnia: no Fatigue/loss of energy: no Feelings of worthlessness: no Feelings of guilt: no Impaired concentration/indecisiveness: no Suicidal ideations: no  Crying spells: no Recent Stressors/Life Changes: no   Relationship problems: no   Family stress: no     Financial stress: no    Job stress: no    Recent death/loss: no  Relevant past medical, surgical, family and social history reviewed and updated as indicated. Interim medical history since our last visit reviewed. Allergies and medications reviewed and updated.  Review of Systems   Constitutional: Negative.   Respiratory: Negative.    Cardiovascular: Negative.   Gastrointestinal: Negative.   Musculoskeletal: Negative.   Neurological: Negative.   Psychiatric/Behavioral: Negative.      Per HPI unless specifically indicated above     Objective:    BP 122/73   Pulse (!) 51   Temp 98.1 F (36.7 C)   Wt 215 lb 14.4 oz (97.9 kg)   SpO2 97%   BMI 30.98 kg/m   Wt Readings from Last 3 Encounters:  12/19/21 215 lb 14.4 oz (97.9 kg)  11/15/21 216 lb 12.8 oz (98.3 kg)  10/24/21 210 lb (95.3 kg)    Physical Exam Vitals and nursing note reviewed.  Constitutional:      General: He is not in acute distress.    Appearance: Normal appearance. He is not ill-appearing, toxic-appearing or diaphoretic.  HENT:     Head: Normocephalic and atraumatic.     Right Ear: External ear normal.     Left Ear: External ear normal.     Nose: Nose normal.     Mouth/Throat:     Mouth: Mucous membranes are moist.     Pharynx: Oropharynx is clear.  Eyes:  General: No scleral icterus.       Right eye: No discharge.        Left eye: No discharge.     Extraocular Movements: Extraocular movements intact.     Conjunctiva/sclera: Conjunctivae normal.     Pupils: Pupils are equal, round, and reactive to light.  Cardiovascular:     Rate and Rhythm: Normal rate and regular rhythm.     Pulses: Normal pulses.     Heart sounds: Normal heart sounds. No murmur heard.    No friction rub. No gallop.  Pulmonary:     Effort: Pulmonary effort is normal. No respiratory distress.     Breath sounds: Normal breath sounds. No stridor. No wheezing, rhonchi or rales.  Chest:     Chest wall: No tenderness.  Musculoskeletal:        General: Normal range of motion.     Cervical back: Normal range of motion and neck supple.  Skin:    General: Skin is warm and dry.     Capillary Refill: Capillary refill takes less than 2 seconds.     Coloration: Skin is not jaundiced or pale.     Findings: No  bruising, erythema, lesion or rash.  Neurological:     General: No focal deficit present.     Mental Status: He is alert and oriented to person, place, and time. Mental status is at baseline.  Psychiatric:        Mood and Affect: Mood normal.        Behavior: Behavior normal.        Thought Content: Thought content normal.        Judgment: Judgment normal.     Results for orders placed or performed in visit on 10/24/21  Microscopic Examination   Urine  Result Value Ref Range   WBC, UA 0-5 0 - 5 /hpf   RBC, Urine 0-2 0 - 2 /hpf   Epithelial Cells (non renal) None seen 0 - 10 /hpf   Bacteria, UA None seen None seen/Few  Urinalysis, Complete  Result Value Ref Range   Specific Gravity, UA 1.015 1.005 - 1.030   pH, UA 7.0 5.0 - 7.5   Color, UA Yellow Yellow   Appearance Ur Clear Clear   Leukocytes,UA Negative Negative   Protein,UA Negative Negative/Trace   Glucose, UA Negative Negative   Ketones, UA Negative Negative   RBC, UA Negative Negative   Bilirubin, UA Negative Negative   Urobilinogen, Ur 0.2 0.2 - 1.0 mg/dL   Nitrite, UA Negative Negative   Microscopic Examination See below:       Assessment & Plan:   Problem List Items Addressed This Visit       Other   Acute anxiety    Under good control on current regimen. Continue current regimen. Continue to monitor. Call with any concerns. Refills given.        Relevant Medications   sertraline (ZOLOFT) 50 MG tablet     Follow up plan: Return in about 4 months (around 04/19/2022).

## 2021-12-19 NOTE — Assessment & Plan Note (Signed)
Under good control on current regimen. Continue current regimen. Continue to monitor. Call with any concerns. Refills given.   

## 2021-12-23 NOTE — Progress Notes (Signed)
[facility] [place], [place] [address]  Pulmonary Sleep Medicine   Office Visit Note  Patient Name: Jonathan Burns DOB: Dec 23, 1945 MRN [id number]    Chief Complaint: Obstructive Sleep Apnea visit  Brief History:  Jonathan Burns is seen today for an annual follow up on APAP 12-17cmh2.  The patient has a 7 year history of sleep apnea. Patient is using PAP nightly.  The patient feels rested after sleeping with PAP.  The patient reports benefits from PAP use. Reported sleepiness is  improved and the Epworth Sleepiness Score is 1 out of 24. The patient does not take naps. The patient complains of the following: no complaints.  The compliance download shows   99%compliance with an average use time of 7 hours. The AHI is 2.1  The patient does not complain of limb movements disrupting sleep. Machine is at end of life and must be replaced.   ROS  General: (-) fever, (-) chills, (-) night sweat Nose and Sinuses: (-) nasal stuffiness or itchiness, (-) postnasal drip, (-) nosebleeds, (-) sinus trouble. Mouth and Throat: (-) sore throat, (-) hoarseness. Neck: (-) swollen glands, (-) enlarged thyroid, (-) neck pain. Respiratory: - cough, - shortness of breath, - wheezing. Neurologic: - numbness, - tingling. Psychiatric: +anxiety, - depression   Current Medication: Outpatient Encounter Medications as of 12/24/2021  Medication Sig   amLODipine (NORVASC) 10 MG tablet Take 1 tablet (10 mg total) by mouth daily.   baclofen (LIORESAL) 10 MG tablet Take 1 tablet (10 mg total) by mouth at bedtime.   carvedilol (COREG) 25 MG tablet Take 1 tablet (25 mg total) by mouth 2 (two) times daily with a meal.   Ferrous Sulfate (IRON) 325 (65 Fe) MG TABS Take 1 tablet (325 mg total) by mouth daily.   hydrochlorothiazide (HYDRODIURIL) 25 MG tablet 1 tab daily   LORazepam (ATIVAN) 0.5 MG tablet Take 1 tablet (0.5 mg total) by mouth daily as needed for anxiety.   losartan (COZAAR) 100 MG tablet Take  1 tablet (100 mg total) by mouth daily.   Multiple Vitamin (MULTIVITAMIN) tablet Take 1 tablet by mouth daily.   Multiple Vitamins-Minerals (PRESERVISION AREDS PO) Take 1 tablet by mouth 2 (two) times daily.   Omega-3 1000 MG CAPS Take 1 capsule by mouth daily.   pantoprazole (PROTONIX) 40 MG tablet Take 1 tablet (40 mg total) by mouth daily.   sertraline (ZOLOFT) 50 MG tablet Take 1 tablet (50 mg total) by mouth daily.   tamsulosin (FLOMAX) 0.4 MG CAPS capsule Take 1 capsule (0.4 mg total) by mouth daily.   valACYclovir (VALTREX) 500 MG tablet Take 500 mg by mouth daily.   No facility-administered encounter medications on file as of 12/24/2021.    Surgical History: Past Surgical History:  Procedure Laterality Date   COLONOSCOPY  2015   ESOPHAGOGASTRODUODENOSCOPY  2015   FLEXIBLE BRONCHOSCOPY Left 01/20/2019   Procedure: FLEXIBLE BRONCHOSCOPY;  Surgeon: Nestor Lewandowsky, MD;  Location: [facility] ORS;  Service: Thoracic;  Laterality: Left;   THORACOTOMY Left 01/20/2019   Procedure: CONVERTED TO THORACOTOMY MAJOR;  Surgeon: Nestor Lewandowsky, MD;  Location: [facility] ORS;  Service: Thoracic;  Laterality: Left;   VIDEO ASSISTED THORACOSCOPY (VATS)/THOROCOTOMY Left 01/20/2019   Procedure: VIDEO ASSISTED THORACOSCOPY (VATS)/THOROCOTOMY;  Surgeon: Nestor Lewandowsky, MD;  Location: [facility] ORS;  Service: Thoracic;  Laterality: Left;    Medical History: Past Medical History:  Diagnosis Date   Anemia    BPH (benign prostatic hypertrophy)    Cancer ([place])  Chronic GI bleeding    COPD (chronic obstructive pulmonary disease) (HCC)    Hyperlipidemia    Hypertension    Prostate nodule    PUD (peptic ulcer disease)    Rising PSA level     Family History: Non contributory to the present illness  Social History: Social History   Socioeconomic History   Marital status: Widowed    Spouse name: Not on file   Number of children: Not on file   Years of education: Not on file   Highest education level: Associate  degree: academic program  Occupational History   Not on file  Tobacco Use   Smoking status: Former    Packs/day: 1.00    Years: 30.00    Total pack years: 30.00    Types: Cigarettes    Quit date: 08/17/1986    Years since quitting: 35.3   Smokeless tobacco: Never  Vaping Use   Vaping Use: Never used  Substance and Sexual Activity   Alcohol use: Yes    Alcohol/week: 2.0 standard drinks of alcohol    Types: 2 Standard drinks or equivalent per week    Comment: twice a week, mixes up between drinks    Drug use: No   Sexual activity: Not on file  Other Topics Concern   Not on file  Social History Narrative   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    Involved in [place] activities    Social Determinants of Health   Financial Resource Strain: Low Risk  (06/12/2021)   Overall Financial Resource Strain (CARDIA)    Difficulty of Paying Living Expenses: Not hard at all  Food Insecurity: No Food Insecurity (06/12/2021)   Hunger Vital Sign    Worried About Running Out of Food in the Last Year: Never true    Ran Out of Food in the Last Year: Never true  Transportation Needs: No Transportation Needs (06/12/2021)   PRAPARE - Hydrologist (Medical): No    Lack of Transportation (Non-Medical): No  Physical Activity: Sufficiently Active (06/12/2021)   Exercise Vital Sign    Days of Exercise per Week: 4 days    Minutes of Exercise per Session: 40 min  Stress: No Stress Concern Present (06/12/2021)   [place]    Feeling of Stress : Not at all  Social Connections: Moderately Integrated (06/12/2021)   Social Connection and Isolation Panel [NHANES]    Frequency of Communication with Friends and Family: More than three times a week    Frequency of Social Gatherings with Friends and Family: More than three times a week    Attends Religious Services: More than 4 times per year    Active Member of [employer] or  Organizations: Yes    Attends Archivist Meetings: More than 4 times per year    Marital Status: Widowed  Intimate Partner Violence: Not At Risk (06/12/2021)   Humiliation, Afraid, Rape, and Kick questionnaire    Fear of Current or Ex-Partner: No    Emotionally Abused: No    Physically Abused: No    Sexually Abused: No    Vital Signs: Blood pressure 134/74, pulse 62, resp. rate 14, height _0  (1.803 m), weight 219 lb (99.3 kg), SpO2 95 %. Body mass index is 30.54 kg/m.    Examination: General Appearance: The patient is well-developed, well-nourished, and in no distress. Neck Circumference: 42 cm Skin: Gross inspection of skin unremarkable. Head: normocephalic, no gross deformities.  Eyes: no gross deformities noted. ENT: ears appear grossly normal Neurologic: Alert and oriented. No involuntary movements.  STOP BANG RISK ASSESSMENT S (snore) Have you been told that you snore?     NO   T (tired) Are you often tired, fatigued, or sleepy during the day?   NO  O (obstruction) Do you stop breathing, choke, or gasp during sleep? NO   P (pressure) Do you have or are you being treated for high blood pressure? YES   B (BMI) Is your body index greater than 35 kg/m? NO   A (age) Are you 38 years old or older? YES   N (neck) Do you have a neck circumference greater than 16 inches?   YES   G (gender) Are you a male? YES   TOTAL STOP/BANG "YES" ANSWERS 4       A STOP-Bang score of 2 or less is considered low risk, and a score of 5 or more is high risk for having either moderate or severe OSA. For people who score 3 or 4, doctors may need to perform further assessment to determine how likely they are to have OSA.         EPWORTH SLEEPINESS SCALE:  Scale:  (0)= no chance of dozing; (1)= slight chance of dozing; (2)= moderate chance of dozing; (3)= high chance of dozing  Chance  Situtation    Sitting and reading: 0    Watching TV: 0    Sitting Inactive in public:  0    As a passenger in car: 1      Lying down to rest: 0    Sitting and talking: 0    Sitting quielty after lunch: 0    In a car, stopped in traffic: 0   TOTAL SCORE:   1 out of 24    SLEEP STUDIES:  PSG ( 08/31/2014) AHI 8, SP02 min 87% CPAP titration (08/30/2014) APAP 12-17cmh20   CPAP COMPLIANCE DATA:  Date Range: 12/25/2020-12/24/21  Average Daily Use: 7 hours  Median Use: 7 hrs 13 min  Compliance for > 4 Hours: 99% days  AHI: 2.1 respiratory events per hour  Days Used: 364/365  Mask Leak: 1.4  95th Percentile Pressure: 16.2         LABS: Recent Results (from the past 2160 hour(s))  Urinalysis, Complete     Status: Abnormal   Collection Time: 10/03/21  8:32 AM  Result Value Ref Range   Specific Gravity, UA 1.020 1.005 - 1.030   pH, UA 5.5 5.0 - 7.5   Color, UA Yellow Yellow   Appearance Ur Clear Clear   Leukocytes,UA Negative Negative   Protein,UA Negative Negative/Trace   Glucose, UA Negative Negative   Ketones, UA Negative Negative   RBC, UA 1+ (A) Negative   Bilirubin, UA Negative Negative   Urobilinogen, Ur 0.2 0.2 - 1.0 mg/dL   Nitrite, UA Negative Negative   Microscopic Examination See below:   Microscopic Examination     Status: Abnormal   Collection Time: 10/03/21  8:32 AM   Urine  Result Value Ref Range   WBC, UA 0-5 0 - 5 /hpf   RBC, Urine 3-10 (A) 0 - 2 /hpf   Epithelial Cells (non renal) 0-10 0 - 10 /hpf   Bacteria, UA Few None seen/Few  Urinalysis, Routine w reflex microscopic     Status: Abnormal   Collection Time: 10/15/21  8:57 AM  Result Value Ref Range   Specific Gravity, UA 1.020 1.005 -  1.030   pH, UA 7.0 5.0 - 7.5   Color, UA Yellow Yellow   Appearance Ur Clear Clear   Leukocytes,UA Negative Negative   Protein,UA Negative Negative/Trace   Glucose, UA Negative Negative   Ketones, UA Negative Negative   RBC, UA Trace (A) Negative   Bilirubin, UA Negative Negative   Urobilinogen, Ur 0.2 0.2 - 1.0 mg/dL    Nitrite, UA Negative Negative   Microscopic Examination See below:   Microscopic Examination     Status: None   Collection Time: 10/15/21  8:57 AM   Urine  Result Value Ref Range   WBC, UA 0-5 0 - 5 /hpf   RBC, Urine 0-2 0 - 2 /hpf   Epithelial Cells (non renal) None seen 0 - 10 /hpf   Bacteria, UA None seen None seen/Few  Comprehensive metabolic panel     Status: Abnormal   Collection Time: 10/15/21  8:59 AM  Result Value Ref Range   Glucose 109 (H) 70 - 99 mg/dL   BUN 16 8 - 27 mg/dL   Creatinine, Ser 0.96 0.76 - 1.27 mg/dL   eGFR 81 >59 mL/min/1.73   BUN/Creatinine Ratio 17 10 - 24   Sodium 138 134 - 144 mmol/L   Potassium 4.1 3.5 - 5.2 mmol/L   Chloride 99 96 - 106 mmol/L   CO2 23 20 - 29 mmol/L   Calcium 9.6 8.6 - 10.2 mg/dL   Total Protein 6.5 6.0 - 8.5 g/dL   Albumin 4.4 3.8 - 4.8 g/dL   Globulin, Total 2.1 1.5 - 4.5 g/dL   Albumin/Globulin Ratio 2.1 1.2 - 2.2   Bilirubin Total 0.3 0.0 - 1.2 mg/dL   Alkaline Phosphatase 65 44 - 121 IU/L   AST 18 0 - 40 IU/L   ALT 17 0 - 44 IU/L  CBC with Differential/Platelet     Status: Abnormal   Collection Time: 10/15/21  8:59 AM  Result Value Ref Range   WBC 5.6 3.4 - 10.8 x10E3/uL   RBC 4.42 4.14 - 5.80 x10E6/uL   Hemoglobin 14.4 13.0 - 17.7 g/dL   Hematocrit 42.2 37.5 - 51.0 %   MCV 96 79 - 97 fL   MCH 32.6 26.6 - 33.0 pg   MCHC 34.1 31.5 - 35.7 g/dL   RDW 13.2 11.6 - 15.4 %   Platelets 211 150 - 450 x10E3/uL   Neutrophils 74 Not Estab. %   Lymphs 11 Not Estab. %   Monocytes 12 Not Estab. %   Eos 3 Not Estab. %   Basos 0 Not Estab. %   Neutrophils Absolute 4.1 1.4 - 7.0 x10E3/uL   Lymphocytes Absolute 0.6 (L) 0.7 - 3.1 x10E3/uL   Monocytes Absolute 0.7 0.1 - 0.9 x10E3/uL   EOS (ABSOLUTE) 0.2 0.0 - 0.4 x10E3/uL   Basophils Absolute 0.0 0.0 - 0.2 x10E3/uL   Immature Granulocytes 0 Not Estab. %   Immature Grans (Abs) 0.0 0.0 - 0.1 x10E3/uL  Lipid Panel w/o Chol/HDL Ratio     Status: Abnormal   Collection Time:  10/15/21  8:59 AM  Result Value Ref Range   Cholesterol, Total 196 100 - 199 mg/dL   Triglycerides 97 0 - 149 mg/dL   HDL 77 >39 mg/dL   VLDL Cholesterol Cal 17 5 - 40 mg/dL   LDL Chol Calc (NIH) 102 (H) 0 - 99 mg/dL  PSA     Status: None   Collection Time: 10/15/21  8:59 AM  Result Value Ref Range  Prostate Specific Ag, Serum <0.1 0.0 - 4.0 ng/mL    Comment: Roche ECLIA methodology. According to the American Urological Association, Serum PSA should decrease and remain at undetectable levels after radical prostatectomy. The AUA defines biochemical recurrence as an initial PSA value 0.2 ng/mL or greater followed by a subsequent confirmatory PSA value 0.2 ng/mL or greater. Values obtained with different assay methods or kits cannot be used interchangeably. Results cannot be interpreted as absolute evidence of the presence or absence of malignant disease.   TSH     Status: None   Collection Time: 10/15/21  8:59 AM  Result Value Ref Range   TSH 2.910 0.450 - 4.500 uIU/mL  Ferritin     Status: None   Collection Time: 10/15/21  8:59 AM  Result Value Ref Range   Ferritin 95 30 - 400 ng/mL  Iron Binding Cap (TIBC)([facility])     Status: None   Collection Time: 10/15/21  8:59 AM  Result Value Ref Range   Total Iron Binding Capacity 297 250 - 450 ug/dL   UIBC 216 111 - 343 ug/dL   Iron 81 38 - 169 ug/dL   Iron Saturation 27 15 - 55 %  Urinalysis, Complete     Status: None   Collection Time: 10/24/21  9:18 AM  Result Value Ref Range   Specific Gravity, UA 1.015 1.005 - 1.030   pH, UA 7.0 5.0 - 7.5   Color, UA Yellow Yellow   Appearance Ur Clear Clear   Leukocytes,UA Negative Negative   Protein,UA Negative Negative/Trace   Glucose, UA Negative Negative   Ketones, UA Negative Negative   RBC, UA Negative Negative   Bilirubin, UA Negative Negative   Urobilinogen, Ur 0.2 0.2 - 1.0 mg/dL   Nitrite, UA Negative Negative   Microscopic Examination See below:   Microscopic  Examination     Status: None   Collection Time: 10/24/21  9:18 AM   Urine  Result Value Ref Range   WBC, UA 0-5 0 - 5 /hpf   RBC, Urine 0-2 0 - 2 /hpf   Epithelial Cells (non renal) None seen 0 - 10 /hpf   Bacteria, UA None seen None seen/Few    Radiology: Abdomen 1 view (KUB)  Result Date: 10/23/2021 CLINICAL DATA:  Right kidney stone follow-up. EXAM: ABDOMEN - 1 VIEW COMPARISON:  CT abdomen pelvis October 17, 2021 FINDINGS: The bowel gas pattern is normal. Gallstone is identified unchanged. The previous CT noted stone in the right ureteral vesicular junction is not seen on x-ray. Pelvic phleboliths are noted. Degenerative joint changes of the spine and scoliosis of the spine are noted. IMPRESSION: The previous CT noted stone in the right ureteral vesicular junction is not seen on x-ray. Electronically Signed   By: Abelardo Diesel M.D.   On: 10/23/2021 12:52    No results found.  No results found.    Assessment and Plan: Patient Active Problem List   Diagnosis Date Noted   Aortic atherosclerosis ([place]) 05/09/2020   CPAP use counseling 12/20/2019   Acquired trigger finger of left middle finger 06/16/2019   PUD (peptic ulcer disease)    Empyema ([place]) 01/20/2019   Hypokalemia    Empyema of left pleural space ([place]) 01/13/2019   GERD (gastroesophageal reflux disease) 08/25/2018   Iron (Fe) deficiency anemia 08/25/2018   Trigger finger, right middle finger 02/02/2018   Advanced care planning/counseling discussion 05/27/2017   Prostate cancer ([place]) 05/27/2017   BPH with obstruction/lower urinary tract symptoms 04/15/2015  Acute anxiety 02/23/2015   Prostate nodule 10/10/2014   OSA on CPAP 08/31/2014   Chronic GI bleeding 08/17/2014   Hyperlipidemia    HTN (hypertension), benign    COPD (chronic obstructive pulmonary disease) ([place])    1. OSA on CPAP The patient does tolerate PAP and reports  benefit from PAP use. Machine is at end of life and must be replaced. The patient was  reminded how to clean equipment and advised to replace supplies routinely. The patient was also counselled on weight loss. The compliance is excellent. The AHI is 2.1.   OSA on cpap- CPAP continues to be medically necessary to treat this patient's OSA.  Replace machine. F/u 30d after set up  2. CPAP use counseling CPAP Counseling: had a lengthy discussion with the patient regarding the importance of PAP therapy in management of the sleep apnea. Patient appears to understand the risk factor reduction and also understands the risks associated with untreated sleep apnea. Patient will try to make a good faith effort to remain compliant with therapy. Also instructed the patient on proper cleaning of the device including the water must be changed daily if possible and use of distilled water is preferred. Patient understands that the machine should be regularly cleaned with appropriate recommended cleaning solutions that do not damage the PAP machine for example given white vinegar and water rinses. Other methods such as ozone treatment may not be as good as these simple methods to achieve cleaning.   3. HTN (hypertension), benign Hypertension Counseling:   The following hypertensive lifestyle modification were recommended and discussed:  1. Limiting alcohol intake to less than 1 oz/day of ethanol:(24 oz of beer or 8 oz of wine or 2 oz of 100-proof whiskey). 2. Take baby ASA 81 mg daily. 3. Importance of regular aerobic exercise and losing weight. 4. Reduce dietary saturated fat and cholesterol intake for overall cardiovascular health. 5. Maintaining adequate dietary potassium, calcium, and magnesium intake. 6. Regular monitoring of the blood pressure. 7. Reduce sodium intake to less than 100 mmol/day (less than 2.3 gm of sodium or less than 6 gm of sodium choride)        General Counseling: I have discussed the findings of the evaluation and examination with Jonathan Burns.  I have also discussed any further  diagnostic evaluation thatmay be needed or ordered today. Jonathan Burns verbalizes understanding of the findings of todays visit. We also reviewed his medications today and discussed drug interactions and side effects including but not limited excessive drowsiness and altered mental states. We also discussed that there is always a risk not just to him but also people around him. he has been encouraged to call the office with any questions or concerns that should arise related to todays visit.  No orders of the defined types were placed in this encounter.       I have personally obtained a history, examined the patient, evaluated laboratory and imaging results, formulated the assessment and plan and placed orders. This patient was seen today by Tressie Ellis, PA-C in collaboration with Dr. Devona Konig.   Allyne Gee, MD [facility] Diplomate ABMS Pulmonary Critical Care Medicine and Sleep Medicine

## 2021-12-24 ENCOUNTER — Ambulatory Visit (INDEPENDENT_AMBULATORY_CARE_PROVIDER_SITE_OTHER): Payer: Medicare Other | Admitting: Internal Medicine

## 2021-12-24 VITALS — BP 134/74 | HR 62 | Resp 14 | Ht 71.0 in | Wt 219.0 lb

## 2021-12-24 DIAGNOSIS — I10 Essential (primary) hypertension: Secondary | ICD-10-CM

## 2021-12-24 DIAGNOSIS — Z7189 Other specified counseling: Secondary | ICD-10-CM

## 2021-12-24 DIAGNOSIS — G4733 Obstructive sleep apnea (adult) (pediatric): Secondary | ICD-10-CM | POA: Diagnosis not present

## 2021-12-24 NOTE — Patient Instructions (Signed)

## 2022-01-17 ENCOUNTER — Ambulatory Visit
Admission: RE | Admit: 2022-01-17 | Discharge: 2022-01-17 | Disposition: A | Discharge status: Home / Self Care | Payer: Medicare Other | Source: Ambulatory Visit | Attending: Urology | Admitting: Urology

## 2022-01-17 DIAGNOSIS — R911 Solitary pulmonary nodule: Secondary | ICD-10-CM | POA: Insufficient documentation

## 2022-01-17 DIAGNOSIS — J432 Centrilobular emphysema: Secondary | ICD-10-CM | POA: Diagnosis not present

## 2022-01-17 DIAGNOSIS — J90 Pleural effusion, not elsewhere classified: Secondary | ICD-10-CM | POA: Diagnosis not present

## 2022-01-22 ENCOUNTER — Telehealth: Payer: Self-pay

## 2022-01-22 NOTE — Progress Notes (Signed)
Virtual Visit via Telephone Note  I connected with Jonathan Burns on 01/22/22 at  8:30 AM EST by telephone and verified that I am speaking with the correct person using two identifiers.  Location: Patient: Home  Provider: Office   I discussed the limitations, risks, security and privacy concerns of performing an evaluation and management service by telephone and the availability of in person appointments. I also discussed with the patient that there may be a patient responsible charge related to this service. The patient expressed understanding and agreed to proceed.   History of Present Illness: Urological history 1.  Prostate cancer -PSA <0.1 09/2021 -high risk prostate cancer - underwent a biopsy for an elevated PSA of 6.0, and a right-sided prostate nodule. TRUS volume 33 cc.  Prostate biopsy revealed 12 of 12 cores positive including lesions up to Gleason 4+4 involving 80% of the tissue on the right lateral mid core 03/2017 - completed 07/2017 IM RT radiation therapy to both his prostate and pelvic nodes - taking calcium and Vitamin D   2. LU TS -tamsulosin 0.4 mg prn   3.  High risk hematuria - former smoker -hx of micro heme (3-10 RBC's) - CT with contrast 12/2018  noted The adrenal glands are unremarkable. There is no hydronephrosis on either side. There is symmetric enhancement and excretion of contrast by both kidneys. Subcentimeter bilateral renal hypodense foci are too small to characterize. The visualized ureters appear unremarkable. The urinary bladder is collapsed. There is apparent diffuse thickening of the bladder wall which may be partly related to underdistention. Cystitis is not excluded. Correlation with urinalysis recommended.  Partially visualized loculated appearing moderate-sized left pleural effusion with findings concerning for empyema - for which he was hospitalized -Cysto 03/29/2019 with Dr. Bernardo Heater Mild lateral lobe enlargement prostate, mild hypervascularity  prostate -CT urogram (10/2021) - There is a tiny 2 mm stone within the right UVJ without associated hydroureter or ureteral lithiasis.  Bilateral punctate nonobstructing renal calculi. -no reports of gross heme  4. Nephrolithiasis -bilateral punctate stones  -spontaneous passage of right UVJ stone  5. Renal cysts -CTU (09/2021) - bilateral renal cysts   He is contacted by telephone to discuss the results of his recent chest CT.  Chest CT was performed on January 17, 2022 for surveillance on pulmonary nodule.  It appears that the pulmonary nodules remain unchanged when compared to what was found on the CT urogram in August 2023.  Radiology is recommending yearly surveillance.   Observations/Objective: Narrative & Impression  CLINICAL DATA:  Lung nodule follow-up   EXAM: CT CHEST WITHOUT CONTRAST   TECHNIQUE: Multidetector CT imaging of the chest was performed following the standard protocol without IV contrast.   RADIATION DOSE REDUCTION: This exam was performed according to the departmental dose-optimization program which includes automated exposure control, adjustment of the mA and/or kV according to patient size and/or use of iterative reconstruction technique.   COMPARISON:  Chest CT dated March 26, 2019   FINDINGS: Cardiovascular: Normal heart size. No pericardial effusion. Normal caliber thoracic aorta with moderate calcified plaque. Moderate coronary artery calcifications.   Mediastinum/Nodes: Moderate hiatal hernia. Thyroid is unremarkable. No pathologically enlarged lymph nodes seen in the chest.   Lungs/Pleura: Central airways are patent. Mild centrilobular emphysema. No consolidation, pleural effusion or pneumothorax. Trace right pleural effusion. Interval decreased left pleural thickening. Mild subpleural linear opacities of the left hemithorax, likely due to scarring. Upper lobe predominant ground-glass nodules, unchanged when compared with prior exam,  largest is  a 7 mm nodule of the left upper lobe located on series 3, image 40.   Upper Abdomen: Hepatic steatosis. Unchanged left hepatic cyst gallstones with no evidence of gallbladder wall thickening. No acute abnormality.   Musculoskeletal: No chest wall mass or suspicious bone lesions identified.   IMPRESSION: 1. Upper lobe predominant small ground-glass nodules, unchanged when compared with prior exam, largest is a 7 mm nodule of the left upper lobe. Recommend continued annual surveillance. 2. Stable trace right pleural effusion. 3. Decreased left pleural thickening. 4. Aortic Atherosclerosis (ICD10-I70.0) and Emphysema (ICD10-J43.9).     Electronically Signed   By: Yetta Glassman M.D.   On: 01/17/2022 17:55    I have independently reviewed the films.  See HPI.    Assessment and Plan: 1. Pulmonary nodules -will check with PCP to see if they will take over the yearly surveillance  2. Prostate cancer -return in February for PSA  3. BPH w/LU TS -continue tamsulosin 0.4 mg daily   4. High risk hematuria -past work up revealed hypervascular prostate and nephrolithiasis -no reports of gross heme  Follow Up Instructions:   I discussed the assessment and treatment plan with the patient. The patient was provided an opportunity to ask questions and all were answered. The patient agreed with the plan and demonstrated an understanding of the instructions.   The patient was advised to call back or seek an in-person evaluation if the symptoms worsen or if the condition fails to improve as anticipated.  I provided 10 minutes of non-face-to-face time during this encounter.   Lakeithia Rasor, PA-C

## 2022-01-22 NOTE — Telephone Encounter (Signed)
Paperwork has been faxed  back to [facility]

## 2022-01-23 ENCOUNTER — Encounter: Payer: Self-pay | Admitting: Urology

## 2022-01-23 ENCOUNTER — Ambulatory Visit (INDEPENDENT_AMBULATORY_CARE_PROVIDER_SITE_OTHER): Payer: Medicare Other | Admitting: Urology

## 2022-01-23 DIAGNOSIS — R319 Hematuria, unspecified: Secondary | ICD-10-CM | POA: Diagnosis not present

## 2022-01-23 DIAGNOSIS — C61 Malignant neoplasm of prostate: Secondary | ICD-10-CM

## 2022-01-23 DIAGNOSIS — R911 Solitary pulmonary nodule: Secondary | ICD-10-CM

## 2022-01-23 DIAGNOSIS — N138 Other obstructive and reflux uropathy: Secondary | ICD-10-CM

## 2022-01-23 DIAGNOSIS — N401 Enlarged prostate with lower urinary tract symptoms: Secondary | ICD-10-CM | POA: Diagnosis not present

## 2022-02-01 ENCOUNTER — Telehealth: Payer: Self-pay | Admitting: Family Medicine

## 2022-02-01 NOTE — Telephone Encounter (Signed)
Spoke with Jonathan Burns and let her know that Dr. Jeananne Rama is retired and we are unable to get his signature.  She states patient will probably need a new sleep study as the notes will need to be signed specifically by him. She states patient will most likely need to get a new sleep study.

## 2022-02-01 NOTE — Telephone Encounter (Signed)
Kim calling from [place] is calling to request again signed office visit notes from 08/17/2014 that she previously received but were not signed by Dr. Jeananne Rama. Please advise CB- [phone] Fax [phone]

## 2022-02-06 DIAGNOSIS — L578 Other skin changes due to chronic exposure to nonionizing radiation: Secondary | ICD-10-CM | POA: Diagnosis not present

## 2022-02-06 DIAGNOSIS — Z872 Personal history of diseases of the skin and subcutaneous tissue: Secondary | ICD-10-CM | POA: Diagnosis not present

## 2022-02-06 DIAGNOSIS — Z86018 Personal history of other benign neoplasm: Secondary | ICD-10-CM | POA: Diagnosis not present

## 2022-02-06 DIAGNOSIS — L570 Actinic keratosis: Secondary | ICD-10-CM | POA: Diagnosis not present

## 2022-03-26 ENCOUNTER — Other Ambulatory Visit: Payer: Self-pay | Admitting: *Deleted

## 2022-03-26 DIAGNOSIS — C61 Malignant neoplasm of prostate: Secondary | ICD-10-CM

## 2022-04-04 ENCOUNTER — Other Ambulatory Visit: Payer: Medicare Other

## 2022-04-04 DIAGNOSIS — C61 Malignant neoplasm of prostate: Secondary | ICD-10-CM

## 2022-04-06 LAB — PSA: Prostate Specific Ag, Serum: <0.1 ng/mL (ref 0.0–4.0)

## 2022-04-09 NOTE — Progress Notes (Unsigned)
05/06/2018 9:43 AM   Jonathan Burns Jonathan Burns 05-Dec-1945 [id number]  Referring provider: Valerie Roys, DO [place],  [place] [address]  Urological history 1.  Prostate cancer -PSA (03/2022) <0.1 -high risk prostate cancer -underwent a biopsy for an elevated PSA of 6.0, and a right-sided prostate nodule. TRUS volume 33 cc.  Prostate biopsy revealed 12 of 12 cores positive including lesions up to Gleason 4+4 involving 80% of the tissue on the right lateral mid core 03/2017  completed 07/2017 IM RT radiation therapy to both his prostate and pelvic nodes -taking calcium and Vitamin D  2. LU TS -I PSS *** -cysto (2021) - mild lateral lobe enlargement prostate, mild hypervascularity prostate -tamsulosin 0.4 mg prn  3.  High risk hematuria - former smoker - CT with contrast (12/2018) -no worrisome GU findings  -Cysto (03/2019) - hypervascular prostate -CT urogram (10/2021) - There is a tiny 2 mm stone within the right UVJ without associated hydroureter or ureteral lithiasis.  Bilateral punctate nonobstructing renal calculi. -no reports of gross heme -UA (10/2021) negative for micro heme  3.  Nephrolithiasis -CT urogram (10/2021) - There is a tiny 2 mm stone within the right UVJ without associated hydroureter or ureteral lithiasis.  Bilateral punctate nonobstructing renal calculi -Spontaneous passage  Chief Complaint  Patient presents with   Hematuria   Nephrolithiasis    HPI: Jonathan Burns is a 77 y.o. male who presents today for follow up.     Score:  1-7 Mild 8-19 Moderate 20-35 Severe    PMH: Past Medical History:  Diagnosis Date   Anemia    BPH (benign prostatic hypertrophy)    Cancer (HCC)    Chronic GI bleeding    COPD (chronic obstructive pulmonary disease) (HCC)    Hyperlipidemia    Hypertension    Prostate nodule    PUD (peptic ulcer disease)    Rising PSA level     Surgical History: Past Surgical History:  Procedure Laterality Date   COLONOSCOPY   2015   ESOPHAGOGASTRODUODENOSCOPY  2015   FLEXIBLE BRONCHOSCOPY Left 01/20/2019   Procedure: FLEXIBLE BRONCHOSCOPY;  Surgeon: Nestor Lewandowsky, MD;  Location: [facility];  Service: Thoracic;  Laterality: Left;   THORACOTOMY Left 01/20/2019   Procedure: CONVERTED TO THORACOTOMY MAJOR;  Surgeon: Nestor Lewandowsky, MD;  Location: [facility];  Service: Thoracic;  Laterality: Left;   VIDEO ASSISTED THORACOSCOPY (VATS)/THOROCOTOMY Left 01/20/2019   Procedure: VIDEO ASSISTED THORACOSCOPY (VATS)/THOROCOTOMY;  Surgeon: Nestor Lewandowsky, MD;  Location: [facility];  Service: Thoracic;  Laterality: Left;    Home Medications:  Current Outpatient Medications on File Prior to Visit  Medication Sig Dispense Refill   amLODipine (NORVASC) 10 MG tablet Take 1 tablet (10 mg total) by mouth daily. 90 tablet 1   baclofen (LIORESAL) 10 MG tablet Take 1 tablet (10 mg total) by mouth at bedtime. 30 tablet 0   carvedilol (COREG) 25 MG tablet Take 1 tablet (25 mg total) by mouth 2 (two) times daily with a meal. 180 tablet 1   Ferrous Sulfate (IRON) 325 (65 Fe) MG TABS Take 1 tablet (325 mg total) by mouth daily. 90 tablet 3   hydrochlorothiazide (HYDRODIURIL) 25 MG tablet 1 tab daily 90 tablet 1   LORazepam (ATIVAN) 0.5 MG tablet Take 1 tablet (0.5 mg total) by mouth daily as needed for anxiety. 30 tablet 0   losartan (COZAAR) 100 MG tablet Take 1 tablet (100 mg total) by mouth daily. 90 tablet 1   Multiple  Vitamin (MULTIVITAMIN) tablet Take 1 tablet by mouth daily.     Multiple Vitamins-Minerals (PRESERVISION AREDS PO) Take 1 tablet by mouth 2 (two) times daily.     Omega-3 1000 MG CAPS Take 1 capsule by mouth daily.     pantoprazole (PROTONIX) 40 MG tablet Take 1 tablet (40 mg total) by mouth daily. 90 tablet 1   sertraline (ZOLOFT) 25 MG tablet Take 1 tablet (25 mg total) by mouth daily. 30 tablet 3   tamsulosin (FLOMAX) 0.4 MG CAPS capsule Take 1 capsule (0.4 mg total) by mouth daily. 90 capsule 3   valACYclovir (VALTREX) 500 MG  tablet Take 500 mg by mouth daily.     No current facility-administered medications on file prior to visit.      Allergies: No Known Allergies  Family History: Family History  Problem Relation Age of Onset   Cancer Mother        breast   Diabetes Mother    Aneurysm Father    Kidney disease Neg Hx    Prostate cancer Neg Hx    Kidney cancer Neg Hx     Social History:  reports that he quit smoking about 35 years ago. His smoking use included cigarettes. He has a 30.00 pack-year smoking history. He has never used smokeless tobacco. He reports current alcohol use of about 2.0 standard drinks of alcohol per week. He reports that he does not use drugs.  ROS: For pertinent review of systems please refer to history of present illness  Physical Exam: BP 117/70   Pulse (!) 52   Ht 5\' 10"  (1.778 m)   Wt 210 lb (95.3 kg)   BMI 30.13 kg/m   Constitutional:  Well nourished. Alert and oriented, No acute distress. HEENT: [place] AT, moist mucus membranes.  Trachea midline Cardiovascular: No clubbing, cyanosis, or edema. Respiratory: Normal respiratory effort, no increased work of breathing. Neurologic: Grossly intact, no focal deficits, moving all 4 extremities. Psychiatric: Normal mood and affect.   Laboratory Data: Component     Latest Ref Rng 04/04/2022  Prostate Specific Ag, Serum     0.0 - 4.0 ng/mL <0.1   I have reviewed the labs.  Pertinent Imaging N/A  Assessment & Plan:     1. Prostate cancer -PSA remains undetectable -return in 6 months for PSA only  2. High risk hematuria -former smoker -Hematuria work up completed in 2021 - findings positive for hypervascular prostate and nephrolithiasis -No report of gross hematuria  -RTC in one year for UA - patient to report any gross hematuria in the interim     3.  Nephrolithiasis -Bilateral punctate stones on CT urogram completed in August 2023 -No issues with renal colic or passage of stone or gross hematuria -Continue to  follow clinically  4. BPH with LUTS -PSA stable  -PVR < 300 cc *** -symptoms - *** -most bothersome symptoms are *** -continue conservative management, avoiding bladder irritants and timed voiding's -Continue tamsulosin 0.4 mg daily     Return in about 3 months (around 01/23/2022) for chest CT .  Jawanna Dykman, [place] [address], [place], [place] [address] [phone]

## 2022-04-10 ENCOUNTER — Encounter: Payer: Self-pay | Admitting: Urology

## 2022-04-10 ENCOUNTER — Ambulatory Visit (INDEPENDENT_AMBULATORY_CARE_PROVIDER_SITE_OTHER): Payer: Medicare Other | Admitting: Urology

## 2022-04-10 VITALS — BP 117/71 | HR 67 | Ht 70.0 in | Wt 209.0 lb

## 2022-04-10 DIAGNOSIS — N200 Calculus of kidney: Secondary | ICD-10-CM

## 2022-04-10 DIAGNOSIS — R319 Hematuria, unspecified: Secondary | ICD-10-CM | POA: Diagnosis not present

## 2022-04-10 DIAGNOSIS — C61 Malignant neoplasm of prostate: Secondary | ICD-10-CM

## 2022-04-10 DIAGNOSIS — N401 Enlarged prostate with lower urinary tract symptoms: Secondary | ICD-10-CM | POA: Diagnosis not present

## 2022-04-15 ENCOUNTER — Other Ambulatory Visit: Payer: Self-pay | Admitting: Family Medicine

## 2022-04-16 NOTE — Telephone Encounter (Signed)
Requested medication (s) are due for refill today: yes  Requested medication (s) are on the active medication list: yes  Last refill:  10/15/21 #90 0 refills  Future visit scheduled: yes in 6 days   Notes to clinic:  protocol failed last labs 10/15/21 do you want to refill Rx?     Requested Prescriptions  Pending Prescriptions Disp Refills   hydrochlorothiazide (HYDRODIURIL) 25 MG tablet [Pharmacy Med Name: HYDROCHLOROTHIAZIDE 25 MG TAB] 90 tablet 0    Sig: 1 tab daily     Cardiovascular: Diuretics - Thiazide Failed - 04/15/2022  6:39 AM      Failed - Cr in normal range and within 180 days    Creatinine, Ser  Date Value Ref Range Status  10/15/2021 0.96 0.76 - 1.27 mg/dL Final         Failed - K in normal range and within 180 days    Potassium  Date Value Ref Range Status  10/15/2021 4.1 3.5 - 5.2 mmol/L Final         Failed - Na in normal range and within 180 days    Sodium  Date Value Ref Range Status  10/15/2021 138 134 - 144 mmol/L Final         Passed - Last BP in normal range    BP Readings from Last 1 Encounters:  04/10/22 117/71         Passed - Valid encounter within last 6 months    Recent Outpatient Visits           3 months ago Acute anxiety   [place] P, DO   5 months ago Need for influenza vaccination   [place], Megan P, DO   6 months ago [place], [place] P, DO   12 months ago HTN (hypertension), benign   [place], Megan P, DO   1 year ago HTN (hypertension), benign   [place], Megan P, DO       Future Appointments             In 6 days Valerie Roys, DO [place], [facility]

## 2022-04-16 NOTE — Telephone Encounter (Signed)
Does he have enough to get to next week?

## 2022-04-22 ENCOUNTER — Encounter: Payer: Self-pay | Admitting: Family Medicine

## 2022-04-22 ENCOUNTER — Ambulatory Visit (INDEPENDENT_AMBULATORY_CARE_PROVIDER_SITE_OTHER): Payer: Medicare Other | Admitting: Family Medicine

## 2022-04-22 VITALS — BP 124/72 | HR 60 | Temp 98.4°F | Wt 216.1 lb

## 2022-04-22 DIAGNOSIS — K922 Gastrointestinal hemorrhage, unspecified: Secondary | ICD-10-CM | POA: Diagnosis not present

## 2022-04-22 DIAGNOSIS — J42 Unspecified chronic bronchitis: Secondary | ICD-10-CM | POA: Diagnosis not present

## 2022-04-22 DIAGNOSIS — C61 Malignant neoplasm of prostate: Secondary | ICD-10-CM

## 2022-04-22 DIAGNOSIS — I700 Atherosclerosis of aorta: Secondary | ICD-10-CM

## 2022-04-22 DIAGNOSIS — F419 Anxiety disorder, unspecified: Secondary | ICD-10-CM

## 2022-04-22 DIAGNOSIS — I10 Essential (primary) hypertension: Secondary | ICD-10-CM | POA: Diagnosis not present

## 2022-04-22 DIAGNOSIS — E785 Hyperlipidemia, unspecified: Secondary | ICD-10-CM

## 2022-04-22 MED ORDER — LORAZEPAM 0.5 MG PO TABS: 0.5000 mg | ORAL_TABLET | Freq: Every day | ORAL | 0 refills | Status: DC | PRN

## 2022-04-22 MED ORDER — LOSARTAN POTASSIUM 100 MG PO TABS
ORAL_TABLET | ORAL | 1 refills | Status: DC
Start: 2022-04-22 — End: 2022-11-01

## 2022-04-22 MED ORDER — HYDROCHLOROTHIAZIDE 25 MG PO TABS: ORAL_TABLET | ORAL | 1 refills | Status: DC

## 2022-04-22 MED ORDER — AMLODIPINE BESYLATE 10 MG PO TABS: 10.0000 mg | ORAL_TABLET | Freq: Every day | ORAL | 1 refills | Status: DC

## 2022-04-22 MED ORDER — PANTOPRAZOLE SODIUM 40 MG PO TBEC: 40.0000 mg | DELAYED_RELEASE_TABLET | Freq: Every day | ORAL | 1 refills | Status: DC

## 2022-04-22 MED ORDER — CARVEDILOL 25 MG PO TABS: 25.0000 mg | ORAL_TABLET | Freq: Two times a day (BID) | ORAL | 1 refills | Status: DC

## 2022-04-22 MED ORDER — SERTRALINE HCL 50 MG PO TABS: 50.0000 mg | ORAL_TABLET | Freq: Every day | ORAL | 1 refills | Status: DC

## 2022-04-22 NOTE — Assessment & Plan Note (Signed)
Under good control on current regimen. Continue current regimen. Continue to monitor. Call with any concerns. Refills given. Lorazepam should last about 6 months.

## 2022-04-22 NOTE — Assessment & Plan Note (Signed)
Under good control on current regimen. Continue current regimen. Continue to monitor. Call with any concerns. Refills given. Labs drawn today.   

## 2022-04-22 NOTE — Assessment & Plan Note (Signed)
Rechecking labs. Await results. Treat as needed.  

## 2022-04-22 NOTE — Assessment & Plan Note (Signed)
Continue to follow with urology. Call with any concerns.  °

## 2022-04-22 NOTE — Progress Notes (Signed)
BP 124/72   Pulse 60   Temp 98.4 F (36.9 C) (Oral)   Wt 216 lb 1.6 oz (98 kg)   SpO2 96%   BMI 31.01 kg/m    Subjective:    Patient ID: Jonathan Burns, male    DOB: 09-13-1945, 77 y.o.   MRN: [id number]  HPI: Jonathan Burns is a 77 y.o. male  Chief Complaint  Patient presents with   Anxiety   Hypertension   HYPERTENSION / HYPERLIPIDEMIA Satisfied with current treatment? yes Duration of hypertension: chronic BP monitoring frequency: not checking BP medication side effects: no Past BP meds: amlodipine, carvedilol, hctz, losartan Duration of hyperlipidemia: chronic Cholesterol medication side effects: no Cholesterol supplements: fish oil Past cholesterol medications: none Medication compliance: excellent compliance Aspirin: no Recent stressors: no Recurrent headaches: no Visual changes: no Palpitations: no Dyspnea: no Chest pain: no Lower extremity edema: no Dizzy/lightheaded: no   ANXIETY/DEPRESSION Duration: chronic Status:better Anxious mood: yes  Excessive worrying: yes Irritability: no  Sweating: no Nausea: no Palpitations:no Hyperventilation: no Panic attacks: no Agoraphobia: no  Obscessions/compulsions: no Depressed mood: no    04/22/2022    8:04 AM 12/19/2021    9:28 AM 11/15/2021    8:26 AM 10/15/2021    8:38 AM 06/12/2021    8:41 AM  Depression screen PHQ 2/9  Decreased Interest 0 0 0 0 0  Down, Depressed, Hopeless 0 0 1 0 0  PHQ - 2 Score 0 0 1 0 0  Altered sleeping 0 0 0 0 0  Tired, decreased energy 0 0 0 0 0  Change in appetite 0 0 0 0 0  Feeling bad or failure about yourself  0 0 0 0 0  Trouble concentrating 0 0 0 0 0  Moving slowly or fidgety/restless 0 0 0 0 0  Suicidal thoughts 0 0 0 0 0  PHQ-9 Score 0 0 1 0 0  Difficult doing work/chores Not difficult at all  Not difficult at all Not difficult at all Not difficult at all      04/22/2022    8:04 AM 12/19/2021    9:29 AM 11/15/2021    8:26 AM 10/15/2021    8:41 AM  GAD 7 :  Generalized Anxiety Score  Nervous, Anxious, on Edge 0 0 1 0  Control/stop worrying 0 0 1 0  Worry too much - different things 0 0 0 0  Trouble relaxing 0 0 0 0  Restless 0 0 0 0  Easily annoyed or irritable 0 0 1 0  Afraid - awful might happen 0 0 0 0  Total GAD 7 Score 0 0 3 0  Anxiety Difficulty Not difficult at all  Not difficult at all Not difficult at all   Anhedonia: no Weight changes: no Insomnia: no   Hypersomnia: no Fatigue/loss of energy: no Feelings of worthlessness: no Feelings of guilt: no Impaired concentration/indecisiveness: no Suicidal ideations: no  Crying spells: no Recent Stressors/Life Changes: no   Relationship problems: no   Family stress: no     Financial stress: no    Job stress: no    Recent death/loss: no  Relevant past medical, surgical, family and social history reviewed and updated as indicated. Interim medical history since our last visit reviewed. Allergies and medications reviewed and updated.  Review of Systems  Constitutional: Negative.   Respiratory: Negative.    Cardiovascular: Negative.   Gastrointestinal: Negative.   Musculoskeletal: Negative.   Neurological: Negative.   Psychiatric/Behavioral: Negative.  Per HPI unless specifically indicated above     Objective:    BP 124/72   Pulse 60   Temp 98.4 F (36.9 C) (Oral)   Wt 216 lb 1.6 oz (98 kg)   SpO2 96%   BMI 31.01 kg/m   Wt Readings from Last 3 Encounters:  04/22/22 216 lb 1.6 oz (98 kg)  04/10/22 209 lb (94.8 kg)  12/24/21 219 lb (99.3 kg)    Physical Exam Vitals and nursing note reviewed.  Constitutional:      General: He is not in acute distress.    Appearance: Normal appearance. He is not ill-appearing, toxic-appearing or diaphoretic.  HENT:     Head: Normocephalic and atraumatic.     Right Ear: External ear normal.     Left Ear: External ear normal.     Nose: Nose normal.     Mouth/Throat:     Mouth: Mucous membranes are moist.     Pharynx:  Oropharynx is clear.  Eyes:     General: No scleral icterus.       Right eye: No discharge.        Left eye: No discharge.     Extraocular Movements: Extraocular movements intact.     Conjunctiva/sclera: Conjunctivae normal.     Pupils: Pupils are equal, round, and reactive to light.  Cardiovascular:     Rate and Rhythm: Normal rate and regular rhythm.     Pulses: Normal pulses.     Heart sounds: Normal heart sounds. No murmur heard.    No friction rub. No gallop.  Pulmonary:     Effort: Pulmonary effort is normal. No respiratory distress.     Breath sounds: Normal breath sounds. No stridor. No wheezing, rhonchi or rales.  Chest:     Chest wall: No tenderness.  Musculoskeletal:        General: Normal range of motion.     Cervical back: Normal range of motion and neck supple.  Skin:    General: Skin is warm and dry.     Capillary Refill: Capillary refill takes less than 2 seconds.     Coloration: Skin is not jaundiced or pale.     Findings: No bruising, erythema, lesion or rash.  Neurological:     General: No focal deficit present.     Mental Status: He is alert and oriented to person, place, and time. Mental status is at baseline.  Psychiatric:        Mood and Affect: Mood normal.        Behavior: Behavior normal.        Thought Content: Thought content normal.        Judgment: Judgment normal.     Results for orders placed or performed in visit on 04/04/22  PSA  Result Value Ref Range   Prostate Specific Ag, Serum <0.1 0.0 - 4.0 ng/mL      Assessment & Plan:   Problem List Items Addressed This Visit       Cardiovascular and Mediastinum   HTN (hypertension), benign    Under good control on current regimen. Continue current regimen. Continue to monitor. Call with any concerns. Refills given. Labs drawn today.        Relevant Medications   amLODipine (NORVASC) 10 MG tablet   carvedilol (COREG) 25 MG tablet   hydrochlorothiazide (HYDRODIURIL) 25 MG tablet    losartan (COZAAR) 100 MG tablet   Aortic atherosclerosis (HCC) - Primary    Will keep his BP and  cholesterol under good control. Continue to monitor. Call with any concerns.       Relevant Medications   amLODipine (NORVASC) 10 MG tablet   carvedilol (COREG) 25 MG tablet   hydrochlorothiazide (HYDRODIURIL) 25 MG tablet   losartan (COZAAR) 100 MG tablet   Other Relevant Orders   Lipid Panel w/o Chol/HDL Ratio   Comprehensive metabolic panel     Respiratory   COPD (chronic obstructive pulmonary disease) (HCC)    Under good control on current regimen. Continue current regimen. Continue to monitor. Call with any concerns. Refills given. Labs drawn today.         Digestive   Chronic GI bleeding    Rechecking labs. Await results. Treat as needed.       Relevant Medications   pantoprazole (PROTONIX) 40 MG tablet   Other Relevant Orders   CBC with Differential/Platelet     Genitourinary   Prostate cancer ([place])    Continue to follow with urology. Call with any concerns.       Relevant Medications   LORazepam (ATIVAN) 0.5 MG tablet     Other   Hyperlipidemia    Under good control on current regimen. Continue current regimen. Continue to monitor. Call with any concerns. Refills given. Labs drawn today.       Relevant Medications   amLODipine (NORVASC) 10 MG tablet   carvedilol (COREG) 25 MG tablet   hydrochlorothiazide (HYDRODIURIL) 25 MG tablet   losartan (COZAAR) 100 MG tablet   Acute anxiety    Under good control on current regimen. Continue current regimen. Continue to monitor. Call with any concerns. Refills given. Lorazepam should last about 6 months.       Relevant Medications   LORazepam (ATIVAN) 0.5 MG tablet   sertraline (ZOLOFT) 50 MG tablet   Other Visit Diagnoses     Essential hypertension       Relevant Medications   amLODipine (NORVASC) 10 MG tablet   carvedilol (COREG) 25 MG tablet   hydrochlorothiazide (HYDRODIURIL) 25 MG tablet   losartan (COZAAR)  100 MG tablet   Other Relevant Orders   Comprehensive metabolic panel        Follow up plan: Return in about 6 months (around 10/23/2022).

## 2022-04-22 NOTE — Assessment & Plan Note (Signed)
Will keep his BP and cholesterol under good control. Continue to monitor. Call with any concerns.

## 2022-04-23 LAB — CBC WITH DIFFERENTIAL/PLATELET
Basophils Absolute: 0 10*3/uL (ref 0.0–0.2)
Basos: 1 %
EOS (ABSOLUTE): 0.2 10*3/uL (ref 0.0–0.4)
Eos: 3 %
Hematocrit: 45.3 % (ref 37.5–51.0)
Hemoglobin: 15.3 g/dL (ref 13.0–17.7)
Immature Grans (Abs): 0 10*3/uL (ref 0.0–0.1)
Immature Granulocytes: 0 %
Lymphocytes Absolute: 0.6 10*3/uL — ABNORMAL LOW (ref 0.7–3.1)
Lymphs: 10 %
MCH: 32.6 pg (ref 26.6–33.0)
MCHC: 33.8 g/dL (ref 31.5–35.7)
MCV: 96 fL (ref 79–97)
Monocytes Absolute: 0.8 10*3/uL (ref 0.1–0.9)
Monocytes: 13 %
Neutrophils Absolute: 4.2 10*3/uL (ref 1.4–7.0)
Neutrophils: 73 %
Platelets: 202 10*3/uL (ref 150–450)
RBC: 4.7 x10E6/uL (ref 4.14–5.80)
RDW: 12.2 % (ref 11.6–15.4)
WBC: 5.8 10*3/uL (ref 3.4–10.8)

## 2022-04-23 LAB — COMPREHENSIVE METABOLIC PANEL
ALT: 20 IU/L (ref 0–44)
AST: 16 IU/L (ref 0–40)
Albumin/Globulin Ratio: 1.9 (ref 1.2–2.2)
Albumin: 4.5 g/dL (ref 3.8–4.8)
Alkaline Phosphatase: 82 IU/L (ref 44–121)
BUN/Creatinine Ratio: 18 (ref 10–24)
BUN: 16 mg/dL (ref 8–27)
Bilirubin Total: 0.5 mg/dL (ref 0.0–1.2)
CO2: 23 mmol/L (ref 20–29)
Calcium: 9.4 mg/dL (ref 8.6–10.2)
Chloride: 99 mmol/L (ref 96–106)
Creatinine, Ser: 0.87 mg/dL (ref 0.76–1.27)
Globulin, Total: 2.4 g/dL (ref 1.5–4.5)
Glucose: 92 mg/dL (ref 70–99)
Potassium: 4.2 mmol/L (ref 3.5–5.2)
Sodium: 140 mmol/L (ref 134–144)
Total Protein: 6.9 g/dL (ref 6.0–8.5)
eGFR: 88 mL/min/{1.73_m2} (ref >59)

## 2022-04-23 LAB — LIPID PANEL W/O CHOL/HDL RATIO
Cholesterol, Total: 176 mg/dL (ref 100–199)
HDL: 61 mg/dL (ref >39)
LDL Chol Calc (NIH): 91 mg/dL (ref 0–99)
Triglycerides: 137 mg/dL (ref 0–149)
VLDL Cholesterol Cal: 24 mg/dL (ref 5–40)

## 2022-05-30 ENCOUNTER — Telehealth: Payer: Self-pay | Admitting: Family Medicine

## 2022-05-30 NOTE — Telephone Encounter (Signed)
Contacted Wel[facility] Picking to schedule their annual wellness visit. Appointment made for 06/17/2022.  Verlee Rossetti; Care Guide Ambulatory Clinical Support [place] [facility] Direct Dial: [phone]

## 2022-06-03 DIAGNOSIS — H33312 Horseshoe tear of retina without detachment, left eye: Secondary | ICD-10-CM | POA: Diagnosis not present

## 2022-06-03 DIAGNOSIS — B0050 Herpesviral ocular disease, unspecified: Secondary | ICD-10-CM | POA: Diagnosis not present

## 2022-06-03 DIAGNOSIS — H43813 Vitreous degeneration, bilateral: Secondary | ICD-10-CM | POA: Diagnosis not present

## 2022-06-03 DIAGNOSIS — H2513 Age-related nuclear cataract, bilateral: Secondary | ICD-10-CM | POA: Diagnosis not present

## 2022-06-03 DIAGNOSIS — H353131 Nonexudative age-related macular degeneration, bilateral, early dry stage: Secondary | ICD-10-CM | POA: Diagnosis not present

## 2022-06-17 ENCOUNTER — Ambulatory Visit (INDEPENDENT_AMBULATORY_CARE_PROVIDER_SITE_OTHER): Payer: Medicare Other

## 2022-06-17 VITALS — Ht 70.0 in | Wt 216.0 lb

## 2022-06-17 DIAGNOSIS — Z0000 Encounter for general adult medical examination without abnormal findings: Secondary | ICD-10-CM | POA: Diagnosis not present

## 2022-06-17 NOTE — Progress Notes (Signed)
I connected with  Jonathan Burns on 06/17/22 by a audio enabled telemedicine application and verified that I am speaking with the correct person using two identifiers.  Patient Location: Home  Provider Location: Office/Clinic  I discussed the limitations of evaluation and management by telemedicine. The patient expressed understanding and agreed to proceed.  Subjective:   Jonathan Burns is a 77 y.o. male who presents for Medicare Annual/Subsequent preventive examination.  Review of Systems     Cardiac Risk Factors include: advanced age (>46men, >5 women);hypertension;male gender;obesity (BMI >30kg/m2)     Objective:    Today's Vitals   06/17/22 1101  PainSc: 0-No pain   There is no height or weight on file to calculate BMI.     06/17/2022   11:06 AM 06/12/2021    8:35 AM 05/29/2020    8:21 AM 09/15/2019    9:27 AM 05/24/2019    8:22 AM 01/20/2019   12:23 PM 01/18/2019    1:12 AM  Advanced Directives  Does Patient Have a Medical Advance Directive? No Yes Yes Yes Yes No   Type of Special educational needs teacher of [employer] Power of [place];Living will Healthcare Power of [place];Living will Living will;Healthcare Power of Attorney    Does patient want to make changes to medical advance directive?    No - Patient declined     Copy of Healthcare Power of Attorney in Chart?  Yes - validated most recent copy scanned in chart (See row information) No - copy requested No - copy requested Yes - validated most recent copy scanned in chart (See row information)    Would patient like information on creating a medical advance directive? No - Patient declined     No - Patient declined No - Patient declined    Current Medications (verified) Outpatient Encounter Medications as of 06/17/2022  Medication Sig   amLODipine (NORVASC) 10 MG tablet Take 1 tablet (10 mg total) by mouth daily.   baclofen (LIORESAL) 10 MG tablet Take 1 tablet (10 mg total) by mouth at bedtime.    carvedilol (COREG) 25 MG tablet Take 1 tablet (25 mg total) by mouth 2 (two) times daily with a meal.   Ferrous Sulfate (IRON) 325 (65 Fe) MG TABS Take 1 tablet (325 mg total) by mouth daily.   hydrochlorothiazide (HYDRODIURIL) 25 MG tablet 1 tab daily   LORazepam (ATIVAN) 0.5 MG tablet Take 1 tablet (0.5 mg total) by mouth daily as needed for anxiety.   losartan (COZAAR) 100 MG tablet Take 1 tablet (100 mg total) by mouth daily.   Multiple Vitamin (MULTIVITAMIN) tablet Take 1 tablet by mouth daily.   Multiple Vitamins-Minerals (PRESERVISION AREDS PO) Take 1 tablet by mouth 2 (two) times daily.   Omega-3 1000 MG CAPS Take 1 capsule by mouth daily.   pantoprazole (PROTONIX) 40 MG tablet Take 1 tablet (40 mg total) by mouth daily.   sertraline (ZOLOFT) 50 MG tablet Take 1 tablet (50 mg total) by mouth daily.   tamsulosin (FLOMAX) 0.4 MG CAPS capsule Take 1 capsule (0.4 mg total) by mouth daily.   valACYclovir (VALTREX) 500 MG tablet Take 500 mg by mouth daily.   No facility-administered encounter medications on file as of 06/17/2022.    Allergies (verified) Patient has no known allergies.   History: Past Medical History:  Diagnosis Date   Anemia    BPH (benign prostatic hypertrophy)    Cancer (HCC)    Chronic GI bleeding    COPD (chronic  obstructive pulmonary disease) (HCC)    Hyperlipidemia    Hypertension    Prostate nodule    PUD (peptic ulcer disease)    Rising PSA level    Past Surgical History:  Procedure Laterality Date   COLONOSCOPY  2015   ESOPHAGOGASTRODUODENOSCOPY  2015   FLEXIBLE BRONCHOSCOPY Left 01/20/2019   Procedure: FLEXIBLE BRONCHOSCOPY;  Surgeon: Hulda Marin, MD;  Location: [facility] OR[facility];  Service: Thoracic;  Laterality: Left;   THORACOTOMY Left 01/20/2019   Procedure: CONVERTED TO THORACOTOMY MAJOR;  Surgeon: Hulda Marin, MD;  Location: [facility];  Service: Thoracic;  Laterality: Left;   VIDEO ASSISTED THORACOSCOPY (VATS)/THOROCOTOMY Left 01/20/2019   Procedure:  VIDEO ASSISTED THORACOSCOPY (VATS)/THOROCOTOMY;  Surgeon: Hulda Marin, MD;  Location: [facility];  Service: Thoracic;  Laterality: Left;   Family History  Problem Relation Age of Onset   Cancer Mother        breast   Diabetes Mother    Aneurysm Father    Kidney disease Neg Hx    Prostate cancer Neg Hx    Kidney cancer Neg Hx    Social History   Socioeconomic History   Marital status: Widowed    Spouse name: Not on file   Number of children: Not on file   Years of education: Not on file   Highest education level: Associate degree: academic program  Occupational History   Not on file  Tobacco Use   Smoking status: Former    Packs/day: 1.00    Years: 30.00    Additional pack years: 0.00    Total pack years: 30.00    Types: Cigarettes    Quit date: 08/17/1986    Years since quitting: 35.8   Smokeless tobacco: Never  Vaping Use   Vaping Use: Never used  Substance and Sexual Activity   Alcohol use: Yes    Alcohol/week: 2.0 standard drinks of alcohol    Types: 2 Standard drinks or equivalent per week    Comment: twice a week, mixes up between drinks    Drug use: No   Sexual activity: Not on file  Other Topics Concern   Not on file  Social History Narrative   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    Involved in [place] activities    Social Determinants of Health   Financial Resource Strain: Low Risk  (06/17/2022)   Overall Financial Resource Strain (CARDIA)    Difficulty of Paying Living Expenses: Not hard at all  Food Insecurity: No Food Insecurity (06/17/2022)   Hunger Vital Sign    Worried About Running Out of Food in the Last Year: Never true    Ran Out of Food in the Last Year: Never true  Transportation Needs: No Transportation Needs (06/17/2022)   PRAPARE - Administrator, Civil Service (Medical): No    Lack of Transportation (Non-Medical): No  Physical Activity: Insufficiently Active (06/17/2022)   Exercise Vital Sign    Days of Exercise per Week: 3  days    Minutes of Exercise per Session: 30 min  Stress: No Stress Concern Present (06/17/2022)   [employer] of Occupational Health - Occupational Stress Questionnaire    Feeling of Stress : Not at all  Social Connections: Moderately Isolated (06/17/2022)   Social Connection and Isolation Panel [NHANES]    Frequency of Communication with Friends and Family: More than three times a week    Frequency of Social Gatherings with Friends and Family: Twice a week    Attends Religious Services: More than  4 times per year    Active Member of Clubs or Organizations: No    Attends Banker Meetings: Never    Marital Status: Widowed    Tobacco Counseling Counseling given: Not Answered   Clinical Intake:  Pre-visit preparation completed: Yes  Pain : No/denies pain Pain Score: 0-No pain     Nutritional Risks: None Diabetes: No  How often do you need to have someone help you when you read instructions, pamphlets, or other written materials from your doctor or pharmacy?: 1 - Never  Diabetic?no  Interpreter Needed?: No  Information entered by :: Kennedy Bucker, LPN   Activities of Daily Living    06/17/2022   11:06 AM 06/13/2022   10:00 AM  In your present state of health, do you have any difficulty performing the following activities:  Hearing? 0 0  Vision? 0 0  Difficulty concentrating or making decisions? 0 0  Walking or climbing stairs? 0 0  Dressing or bathing? 0 0  Doing errands, shopping? 0 0  Preparing Food and eating ? N N  Using the Toilet? N N  In the past six months, have you accidently leaked urine? N N  Do you have problems with loss of bowel control? N N  Managing your Medications? N N  Managing your Finances? N N  Housekeeping or managing your Housekeeping? N N    Patient Care Team: Dorcas Carrow, DO as PCP - General (Family Medicine) Vanna Scotland, MD as Consulting Physician (Urology) Elnita Maxwell, MD as Referring Physician  (Gastroenterology) Hulan Fray as Physician Assistant (Urology) Carmina Miller, MD as Referring Physician (Radiation Oncology)  Indicate any recent Medical Services you may have received from other than [facility] providers in the past year (date may be approximate).     Assessment:   This is a routine wellness examination for Jonathan Burns.  Hearing/Vision screen Hearing Screening - Comments:: No aids Vision Screening - Comments:: Wears glasses- Dr.Nice  Dietary issues and exercise activities discussed: Current Exercise Habits: Home exercise routine, Type of exercise: walking;strength training/weights, Time (Minutes): 30, Frequency (Times/Week): 3, Weekly Exercise (Minutes/Week): 90, Intensity: Mild   Goals Addressed             This Visit's Progress    DIET - EAT MORE FRUITS AND VEGETABLES         Depression Screen    06/17/2022   11:05 AM 04/22/2022    8:04 AM 12/19/2021    9:28 AM 11/15/2021    8:26 AM 10/15/2021    8:38 AM 06/12/2021    8:41 AM 04/17/2021    8:03 AM  PHQ 2/9 Scores  PHQ - 2 Score 0 0 0 1 0 0 0  PHQ- 9 Score 0 0 0 1 0 0 1    Fall Risk    06/17/2022   11:06 AM 06/13/2022   10:00 AM 04/22/2022    8:03 AM 11/15/2021    8:27 AM 10/15/2021    8:38 AM  Fall Risk   Falls in the past year? 0 0 0 0 0  Number falls in past yr: 0  0 0 0  Injury with Fall? 0  0 0 0  Risk for fall due to : No Fall Risks  No Fall Risks No Fall Risks No Fall Risks  Follow up Falls prevention discussed;Falls evaluation completed  Falls evaluation completed Falls evaluation completed Falls evaluation completed    FALL RISK PREVENTION PERTAINING TO THE HOME:  Any stairs  in or around the home? Yes  If so, are there any without handrails? No  Home free of loose throw rugs in walkways, pet beds, electrical cords, etc? Yes  Adequate lighting in your home to reduce risk of falls? Yes   ASSISTIVE DEVICES UTILIZED TO PREVENT FALLS:  Life alert? No  Use of a cane, walker or w/c? No   Grab bars in the bathroom? Yes  Shower chair or bench in shower? Yes  Elevated toilet seat or a handicapped toilet? Yes   Cognitive Function:        06/17/2022   11:09 AM 06/12/2021    8:34 AM 05/29/2020    8:22 AM 05/24/2019    8:23 AM 04/22/2018    8:26 AM  6CIT Screen  What Year? 0 points 0 points 0 points 0 points 0 points  What month? 0 points 0 points 0 points 0 points 0 points  What time? 0 points 0 points 0 points 0 points 0 points  Count back from 20 0 points 0 points 0 points 0 points 0 points  Months in reverse 0 points 0 points 2 points 0 points 0 points  Repeat phrase 0 points 0 points 0 points 0 points 0 points  Total Score 0 points 0 points 2 points 0 points 0 points    Immunizations Immunization History  Administered Date(s) Administered   Fluad Quad(high Dose 65+) 12/06/2020, 11/15/2021   Influenza,inj,Quad PF,6+ Mos 11/22/2016   Influenza-Unspecified 02/15/2014, 11/29/2014, 12/11/2015, 11/26/2017, 10/20/2018   PFIZER(Purple Top)SARS-COV-2 Vaccination 03/18/2019, 04/08/2019, 10/13/2019   Pneumococcal Conjugate-13 02/15/2014   Pneumococcal Polysaccharide-23 12/19/2005, 09/20/2015   Td 11/20/2004   Td (Adult), 2 Lf Tetanus Toxid, Preservative Free 11/20/2004   Tdap 09/20/2015   Zoster Recombinat (Shingrix) 03/23/2021, 06/08/2021   Zoster, Live 08/06/2011    TDAP status: Up to date  Flu Vaccine status: Up to date  Pneumococcal vaccine status: Up to date  Covid-19 vaccine status: Completed vaccines  Qualifies for Shingles Vaccine? Yes   Zostavax completed Yes   Shingrix Completed?: Yes  Screening Tests Health Maintenance  Topic Date Due   COVID-19 Vaccine (4 - 2023-24 season) 10/19/2021   INFLUENZA VACCINE  09/19/2022   Medicare Annual Wellness (AWV)  06/17/2023   DTaP/Tdap/Td (3 - Td or Tdap) 09/19/2025   Pneumonia Vaccine 42+ Years old  Completed   Hepatitis C Screening  Completed   Zoster Vaccines- Shingrix  Completed   HPV VACCINES  Aged Out    COLONOSCOPY (Pts 45-61yrs Insurance coverage will need to be confirmed)  Discontinued    Health Maintenance  Health Maintenance Due  Topic Date Due   COVID-19 Vaccine (4 - 2023-24 season) 10/19/2021    Colorectal cancer screening: No longer required.   Lung Cancer Screening: (Low Dose CT Chest recommended if Age 64-80 years, 30 pack-year currently smoking OR have quit w/in 15years.) does not qualify.    Additional Screening:  Hepatitis C Screening: does qualify; Completed 02/23/15  Vision Screening: Recommended annual ophthalmology exams for early detection of glaucoma and other disorders of the eye. Is the patient up to date with their annual eye exam?  Yes  Who is the provider or what is the name of the office in which the patient attends annual eye exams? Dr.Nice If pt is not established with a provider, would they like to be referred to a provider to establish care? No .   Dental Screening: Recommended annual dental exams for proper oral hygiene  Community Resource Referral / Chronic  Care Management: CRR required this visit?  No   CCM required this visit?  No      Plan:     I have personally reviewed and noted the following in the patient's chart:   Medical and social history Use of alcohol, tobacco or illicit drugs  Current medications and supplements including opioid prescriptions. Patient is not currently taking opioid prescriptions. Functional ability and status Nutritional status Physical activity Advanced directives List of other physicians Hospitalizations, surgeries, and ER visits in previous 12 months Vitals Screenings to include cognitive, depression, and falls Referrals and appointments  In addition, I have reviewed and discussed with patient certain preventive protocols, quality metrics, and best practice recommendations. A written personalized care plan for preventive services as well as general preventive health recommendations were provided to  patient.     Hal Hope, LPN   05/27/8117   Nurse Notes: none

## 2022-06-17 NOTE — Patient Instructions (Signed)
[name] , Thank you for taking time to come for your Medicare Wellness Visit. I appreciate your ongoing commitment to your health goals. Please review the following plan we discussed and let me know if I can assist you in the future.   These are the goals we discussed:  Goals      DIET - EAT MORE FRUITS AND VEGETABLES     DIET - INCREASE WATER INTAKE     Recommend drinking at least 6-8 glasses of water a day     Patient Stated     05/29/2020, no goals     Weight (lb) < 200 lb (90.7 kg)     6 lbs        This is a list of the screening recommended for you and due dates:  Health Maintenance  Topic Date Due   COVID-19 Vaccine (4 - 2023-24 season) 10/19/2021   Flu Shot  09/19/2022   Medicare Annual Wellness Visit  06/17/2023   DTaP/Tdap/Td vaccine (3 - Td or Tdap) 09/19/2025   Pneumonia Vaccine  Completed   Hepatitis C Screening: USPSTF Recommendation to screen - Ages 105-79 yo.  Completed   Zoster (Shingles) Vaccine  Completed   HPV Vaccine  Aged Out   Colon Cancer Screening  Discontinued    Advanced directives: no  Conditions/risks identified: none  Next appointment: Follow up in one year for your annual wellness visit. 06/23/23 @ 10:15 am by phone  Preventive Care 65 Years and Older, Male  Preventive care refers to lifestyle choices and visits with your health care provider that can promote health and wellness. What does preventive care include? A yearly physical exam. This is also called an annual well check. Dental exams once or twice a year. Routine eye exams. Ask your health care provider how often you should have your eyes checked. Personal lifestyle choices, including: Daily care of your teeth and gums. Regular physical activity. Eating a healthy diet. Avoiding tobacco and drug use. Limiting alcohol use. Practicing safe sex. Taking low doses of aspirin every day. Taking vitamin and mineral supplements as recommended by your health care provider. What happens  during an annual well check? The services and screenings done by your health care provider during your annual well check will depend on your age, overall health, lifestyle risk factors, and family history of disease. Counseling  Your health care provider may ask you questions about your: Alcohol use. Tobacco use. Drug use. Emotional well-being. Home and relationship well-being. Sexual activity. Eating habits. History of falls. Memory and ability to understand (cognition). Work and work Astronomer. Screening  You may have the following tests or measurements: Height, weight, and BMI. Blood pressure. Lipid and cholesterol levels. These may be checked every 5 years, or more frequently if you are over 4 years old. Skin check. Lung cancer screening. You may have this screening every year starting at age 79 if you have a 30-pack-year history of smoking and currently smoke or have quit within the past 15 years. Fecal occult blood test (FOBT) of the stool. You may have this test every year starting at age 79. Flexible sigmoidoscopy or colonoscopy. You may have a sigmoidoscopy every 5 years or a colonoscopy every 10 years starting at age 79. Prostate cancer screening. Recommendations will vary depending on your family history and other risks. Hepatitis C blood test. Hepatitis B blood test. Sexually transmitted disease (STD) testing. Diabetes screening. This is done by checking your blood sugar (glucose) after you have not eaten  for a while (fasting). You may have this done every 1-3 years. Abdominal aortic aneurysm (AAA) screening. You may need this if you are a current or former smoker. Osteoporosis. You may be screened starting at age 79 if you are at high risk. Talk with your health care provider about your test results, treatment options, and if necessary, the need for more tests. Vaccines  Your health care provider may recommend certain vaccines, such as: Influenza vaccine. This is  recommended every year. Tetanus, diphtheria, and acellular pertussis (Tdap, Td) vaccine. You may need a Td booster every 10 years. Zoster vaccine. You may need this after age 79. Pneumococcal 13-valent conjugate (PCV13) vaccine. One dose is recommended after age 79. Pneumococcal polysaccharide (PPSV23) vaccine. One dose is recommended after age 79. Talk to your health care provider about which screenings and vaccines you need and how often you need them. This information is not intended to replace advice given to you by your health care provider. Make sure you discuss any questions you have with your health care provider. Document Released: 03/03/2015 Document Revised: 10/25/2015 Document Reviewed: 12/06/2014 Elsevier Interactive Patient Education  2017 [employer].  Fall Prevention in the Home Falls can cause injuries. They can happen to people of all ages. There are many things you can do to make your home safe and to help prevent falls. What can I do on the outside of my home? Regularly fix the edges of walkways and driveways and fix any cracks. Remove anything that might make you trip as you walk through a door, such as a raised step or threshold. Trim any bushes or trees on the path to your home. Use bright outdoor lighting. Clear any walking paths of anything that might make someone trip, such as rocks or tools. Regularly check to see if handrails are loose or broken. Make sure that both sides of any steps have handrails. Any raised decks and porches should have guardrails on the edges. Have any leaves, snow, or ice cleared regularly. Use sand or salt on walking paths during winter. Clean up any spills in your garage right away. This includes oil or grease spills. What can I do in the bathroom? Use night lights. Install grab bars by the toilet and in the tub and shower. Do not use towel bars as grab bars. Use non-skid mats or decals in the tub or shower. If you need to sit down in  the shower, use a plastic, non-slip stool. Keep the floor dry. Clean up any water that spills on the floor as soon as it happens. Remove soap buildup in the tub or shower regularly. Attach bath mats securely with double-sided non-slip rug tape. Do not have throw rugs and other things on the floor that can make you trip. What can I do in the bedroom? Use night lights. Make sure that you have a light by your bed that is easy to reach. Do not use any sheets or blankets that are too big for your bed. They should not hang down onto the floor. Have a firm chair that has side arms. You can use this for support while you get dressed. Do not have throw rugs and other things on the floor that can make you trip. What can I do in the kitchen? Clean up any spills right away. Avoid walking on wet floors. Keep items that you use a lot in easy-to-reach places. If you need to reach something above you, use a strong step stool that has  a grab bar. Keep electrical cords out of the way. Do not use floor polish or wax that makes floors slippery. If you must use wax, use non-skid floor wax. Do not have throw rugs and other things on the floor that can make you trip. What can I do with my stairs? Do not leave any items on the stairs. Make sure that there are handrails on both sides of the stairs and use them. Fix handrails that are broken or loose. Make sure that handrails are as long as the stairways. Check any carpeting to make sure that it is firmly attached to the stairs. Fix any carpet that is loose or worn. Avoid having throw rugs at the top or bottom of the stairs. If you do have throw rugs, attach them to the floor with carpet tape. Make sure that you have a light switch at the top of the stairs and the bottom of the stairs. If you do not have them, ask someone to add them for you. What else can I do to help prevent falls? Wear shoes that: Do not have high heels. Have rubber bottoms. Are comfortable  and fit you well. Are closed at the toe. Do not wear sandals. If you use a stepladder: Make sure that it is fully opened. Do not climb a closed stepladder. Make sure that both sides of the stepladder are locked into place. Ask someone to hold it for you, if possible. Clearly mark and make sure that you can see: Any grab bars or handrails. First and last steps. Where the edge of each step is. Use tools that help you move around (mobility aids) if they are needed. These include: Canes. Walkers. Scooters. Crutches. Turn on the lights when you go into a dark area. Replace any light bulbs as soon as they burn out. Set up your furniture so you have a clear path. Avoid moving your furniture around. If any of your floors are uneven, fix them. If there are any pets around you, be aware of where they are. Review your medicines with your doctor. Some medicines can make you feel dizzy. This can increase your chance of falling. Ask your doctor what other things that you can do to help prevent falls. This information is not intended to replace advice given to you by your health care provider. Make sure you discuss any questions you have with your health care provider. Document Released: 12/01/2008 Document Revised: 07/13/2015 Document Reviewed: 03/11/2014 Elsevier Interactive Patient Education  2017 [employer].

## 2022-08-02 NOTE — Progress Notes (Signed)
Sleep Medicine   Office Visit  Patient Name: Jonathan Burns DOB: Apr 03, 1945 MRN [id number]    Chief Complaint: need a new machine  Brief History:  Jonathan Burns presents for follow up of his OSA, and he is in need of new machine. The patient has a 8 year history of sleep apnea and is currently on a CPAP. Sleep quality is poor. This is noted every nights. Prior to using a PAP, the patient reported the following symptoms:  snoring, fatigue, and some headaches . The patient goes to sleep at 1000 pm and wakes up at 0545 am. The patient no history of psychiatric problems. The Epworth Sleepiness Score is 2 out of 24 . The patient relates  Cardiovascular risk factors include: hypertension. . The patient is currently on a APAP@ 12-17 cmH2O. The patient reports using his PAP and feels rested after sleeping with PAP.  The patient reports benefiting from PAP use and would like for him to continue using PAP. Reported sleepiness is  improved. The compliance download shows  96% compliance with an average use time of 6 hours 48  minutes. The AHI is 1.5.  The patient continues to require PAP therapy as a medical necessity in order to eliminate his sleep apnea.     ROS  General: (-) fever, (-) chills, (-) night sweat Nose and Sinuses: (-) nasal stuffiness or itchiness, (-) postnasal drip, (-) nosebleeds, (-) sinus trouble. Mouth and Throat: (-) sore throat, (-) hoarseness. Neck: (-) swollen glands, (-) enlarged thyroid, (-) neck pain. Respiratory: - cough, - shortness of breath, - wheezing. Neurologic: - numbness, - tingling. Psychiatric: - anxiety, - depression Sleep behavior: -sleep paralysis -hypnogogic hallucinations -dream enactment      -vivid dreams -cataplexy -night terrors -sleep walking   Current Medication: Outpatient Encounter Medications as of 08/05/2022  Medication Sig Note   amLODipine (NORVASC) 10 MG tablet Take 1 tablet (10 mg total) by mouth daily.    baclofen (LIORESAL) 10 MG tablet Take 1  tablet (10 mg total) by mouth at bedtime.    carvedilol (COREG) 25 MG tablet Take 1 tablet (25 mg total) by mouth 2 (two) times daily with a meal.    Ferrous Sulfate (IRON) 325 (65 Fe) MG TABS Take 1 tablet (325 mg total) by mouth daily. 06/17/2022: Takes two times per week    hydrochlorothiazide (HYDRODIURIL) 25 MG tablet 1 tab daily    LORazepam (ATIVAN) 0.5 MG tablet Take 1 tablet (0.5 mg total) by mouth daily as needed for anxiety.    losartan (COZAAR) 100 MG tablet Take 1 tablet (100 mg total) by mouth daily.    Multiple Vitamin (MULTIVITAMIN) tablet Take 1 tablet by mouth daily.    Multiple Vitamins-Minerals (PRESERVISION AREDS PO) Take 1 tablet by mouth 2 (two) times daily.    Omega-3 1000 MG CAPS Take 1 capsule by mouth daily.    pantoprazole (PROTONIX) 40 MG tablet Take 1 tablet (40 mg total) by mouth daily.    sertraline (ZOLOFT) 50 MG tablet Take 1 tablet (50 mg total) by mouth daily.    tamsulosin (FLOMAX) 0.4 MG CAPS capsule Take 1 capsule (0.4 mg total) by mouth daily.    valACYclovir (VALTREX) 500 MG tablet Take 500 mg by mouth daily.    No facility-administered encounter medications on file as of 08/05/2022.    Surgical History: Past Surgical History:  Procedure Laterality Date   COLONOSCOPY  2015   ESOPHAGOGASTRODUODENOSCOPY  2015   FLEXIBLE BRONCHOSCOPY Left 01/20/2019   Procedure:  FLEXIBLE BRONCHOSCOPY;  Surgeon: Hulda Marin, MD;  Location: [facility];  Service: Thoracic;  Laterality: Left;   THORACOTOMY Left 01/20/2019   Procedure: CONVERTED TO THORACOTOMY MAJOR;  Surgeon: Hulda Marin, MD;  Location: [facility];  Service: Thoracic;  Laterality: Left;   VIDEO ASSISTED THORACOSCOPY (VATS)/THOROCOTOMY Left 01/20/2019   Procedure: VIDEO ASSISTED THORACOSCOPY (VATS)/THOROCOTOMY;  Surgeon: Hulda Marin, MD;  Location: [facility];  Service: Thoracic;  Laterality: Left;    Medical History: Past Medical History:  Diagnosis Date   Anemia    BPH (benign prostatic hypertrophy)     Cancer (HCC)    Chronic GI bleeding    COPD (chronic obstructive pulmonary disease) (HCC)    Hyperlipidemia    Hypertension    Prostate nodule    PUD (peptic ulcer disease)    Rising PSA level     Family History: Non contributory to the present illness  Social History: Social History   Socioeconomic History   Marital status: Widowed    Spouse name: Not on file   Number of children: Not on file   Years of education: Not on file   Highest education level: Associate degree: academic program  Occupational History   Not on file  Tobacco Use   Smoking status: Former    Packs/day: 1.00    Years: 30.00    Additional pack years: 0.00    Total pack years: 30.00    Types: Cigarettes    Quit date: 08/17/1986    Years since quitting: 35.9   Smokeless tobacco: Never  Vaping Use   Vaping Use: Never used  Substance and Sexual Activity   Alcohol use: Yes    Alcohol/week: 2.0 standard drinks of alcohol    Types: 2 Standard drinks or equivalent per week    Comment: twice a week, mixes up between drinks    Drug use: No   Sexual activity: Not on file  Other Topics Concern   Not on file  Social History Narrative   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    Involved in [place] activities    Social Determinants of Health   Financial Resource Strain: Low Risk  (06/17/2022)   Overall Financial Resource Strain (CARDIA)    Difficulty of Paying Living Expenses: Not hard at all  Food Insecurity: No Food Insecurity (06/17/2022)   Hunger Vital Sign    Worried About Running Out of Food in the Last Year: Never true    Ran Out of Food in the Last Year: Never true  Transportation Needs: No Transportation Needs (06/17/2022)   PRAPARE - Administrator, Civil Service (Medical): No    Lack of Transportation (Non-Medical): No  Physical Activity: Insufficiently Active (06/17/2022)   Exercise Vital Sign    Days of Exercise per Week: 3 days    Minutes of Exercise per Session: 30 min  Stress:  No Stress Concern Present (06/17/2022)   [employer] of Occupational Health - Occupational Stress Questionnaire    Feeling of Stress : Not at all  Social Connections: Moderately Isolated (06/17/2022)   Social Connection and Isolation Panel [NHANES]    Frequency of Communication with Friends and Family: More than three times a week    Frequency of Social Gatherings with Friends and Family: Twice a week    Attends Religious Services: More than 4 times per year    Active Member of [employer] or Organizations: No    Attends Banker Meetings: Never    Marital Status: Widowed  Catering manager  Violence: Not At Risk (06/17/2022)   Humiliation, Afraid, Rape, and Kick questionnaire    Fear of Current or Ex-Partner: No    Emotionally Abused: No    Physically Abused: No    Sexually Abused: No    Vital Signs: Blood pressure (!) 141/71, pulse 61, resp. rate 16, height 5\' 9"  (1.753 m), weight 219 lb (99.3 kg), SpO2 97 %. Body mass index is 32.34 kg/m.   Examination: General Appearance: The patient is well-developed, well-nourished, and in no distress. Neck Circumference: 46 cm Skin: Gross inspection of skin unremarkable. Head: normocephalic, no gross deformities. Eyes: no gross deformities noted. ENT: ears appear grossly normal Neurologic: Alert and oriented. No involuntary movements.    STOP BANG RISK ASSESSMENT S (snore) Have you been told that you snore?     NO   T (tired) Are you often tired, fatigued, or sleepy during the day?   NO  O (obstruction) Do you stop breathing, choke, or gasp during sleep? NO   P (pressure) Do you have or are you being treated for high blood pressure? YES   B (BMI) Is your body index greater than 35 kg/m? NO   A (age) Are you 28 years old or older? NO   N (neck) Do you have a neck circumference greater than 16 inches?   NO   G (gender) Are you a male? YES   TOTAL STOP/BANG "YES" ANSWERS 2                                                                A STOP-Bang score of 2 or less is considered low risk, and a score of 5 or more is high risk for having either moderate or severe OSA. For people who score 3 or 4, doctors may need to perform further assessment to determine how likely they are to have OSA.         EPWORTH SLEEPINESS SCALE:  Scale:  (0)= no chance of dozing; (1)= slight chance of dozing; (2)= moderate chance of dozing; (3)= high chance of dozing  Chance  Situtation    Sitting and reading: 0    Watching TV: 0    Sitting Inactive in public: 0    As a passenger in car: 1      Lying down to rest: 0    Sitting and talking: 0    Sitting quielty after lunch: 1    In a car, stopped in traffic: 0   TOTAL SCORE:   2 out of 24    SLEEP STUDIES:  PSG (08/2014) AHI 8/hr, Supine AHI 38/hr, lateral AHI 20/hr, min SpO2 87%, PLMI 215.3. Titration (08/2014) APAP@ 12-17 cmH2O   CPAP COMPLIANCE DATA:  Date Range: 08/05/2021-07/25/2022  Average Daily Use: 6 hours 48 minutes  Median Use: 6 hours 56 minutes  Compliance for > 4 Hours: 96 %  AHI: 1.5 respiratory events per hour  Days Used: 363/365 days  Mask Leak: 8.2  95th Percentile Pressure: 16   LABS: No results found for this or any previous visit (from the past 2160 hour(s)).  Radiology: CT CHEST WO CONTRAST  Addendum Date: 01/23/2022   ADDENDUM REPORT: 01/23/2022 08:49 ADDENDUM: When compared with abdomen and pelvis CT dated October 17, 2021 a tiny residual linear  opacity measuring 6 x 3 mm is seen at the site of previously described part solid nodule of the right lower lobe on series 3, image 108, compatible with resolving infectious or inflammatory process. Electronically Signed   By: Allegra Lai M.D.   On: 01/23/2022 08:49   Result Date: 01/23/2022 CLINICAL DATA:  Lung nodule follow-up EXAM: CT CHEST WITHOUT CONTRAST TECHNIQUE: Multidetector CT imaging of the chest was performed following the standard protocol without IV contrast.  RADIATION DOSE REDUCTION: This exam was performed according to the departmental dose-optimization program which includes automated exposure control, adjustment of the mA and/or kV according to patient size and/or use of iterative reconstruction technique. COMPARISON:  Chest CT dated March 26, 2019 FINDINGS: Cardiovascular: Normal heart size. No pericardial effusion. Normal caliber thoracic aorta with moderate calcified plaque. Moderate coronary artery calcifications. Mediastinum/Nodes: Moderate hiatal hernia. Thyroid is unremarkable. No pathologically enlarged lymph nodes seen in the chest. Lungs/Pleura: Central airways are patent. Mild centrilobular emphysema. No consolidation, pleural effusion or pneumothorax. Trace right pleural effusion. Interval decreased left pleural thickening. Mild subpleural linear opacities of the left hemithorax, likely due to scarring. Upper lobe predominant ground-glass nodules, unchanged when compared with prior exam, largest is a 7 mm nodule of the left upper lobe located on series 3, image 40. Upper Abdomen: Hepatic steatosis. Unchanged left hepatic cyst gallstones with no evidence of gallbladder wall thickening. No acute abnormality. Musculoskeletal: No chest wall mass or suspicious bone lesions identified. IMPRESSION: 1. Upper lobe predominant small ground-glass nodules, unchanged when compared with prior exam, largest is a 7 mm nodule of the left upper lobe. Recommend continued annual surveillance. 2. Stable trace right pleural effusion. 3. Decreased left pleural thickening. 4. Aortic Atherosclerosis (ICD10-I70.0) and Emphysema (ICD10-J43.9). Electronically Signed: By: Allegra Lai M.D. On: 01/17/2022 17:55    No results found.  No results found.    Assessment and Plan: Patient Active Problem List   Diagnosis Date Noted   Aortic atherosclerosis (HCC) 05/09/2020   CPAP use counseling 12/20/2019   Acquired trigger finger of left middle finger 06/16/2019   PUD  (peptic ulcer disease)    Empyema (HCC) 01/20/2019   Hypokalemia    Empyema of left pleural space (HCC) 01/13/2019   GERD (gastroesophageal reflux disease) 08/25/2018   Iron (Fe) deficiency anemia 08/25/2018   Trigger finger, right middle finger 02/02/2018   Advanced care planning/counseling discussion 05/27/2017   Prostate cancer (HCC) 05/27/2017   BPH with obstruction/lower urinary tract symptoms 04/15/2015   Acute anxiety 02/23/2015   Prostate nodule 10/10/2014   OSA on CPAP 08/31/2014   Chronic GI bleeding 08/17/2014   Hyperlipidemia    HTN (hypertension), benign    COPD (chronic obstructive pulmonary disease) (HCC)    1. OSA on CPAP PLAN OSA:   Patient evaluation suggests high risk of sleep disordered breathing due to history of OSA, snoring, headaches and daytime sleepiness.  Patient has comorbid cardiovascular risk factors including: hypertension which could be exacerbated by pathologic sleep-disordered breathing.  Suggest: Home Sleep study  to assess/treat the patient's sleep disordered breathing. The patient was also counselled on weight loss to optimize sleep health.   2. CPAP use counseling CPAP Counseling: had a lengthy discussion with the patient regarding the importance of PAP therapy in management of the sleep apnea. Patient appears to understand the risk factor reduction and also understands the risks associated with untreated sleep apnea. Patient will try to make a good faith effort to remain compliant with therapy. Also instructed the patient  on proper cleaning of the device including the water must be changed daily if possible and use of distilled water is preferred. Patient understands that the machine should be regularly cleaned with appropriate recommended cleaning solutions that do not damage the PAP machine for example given white vinegar and water rinses. Other methods such as ozone treatment may not be as good as these simple methods to achieve cleaning.   3.  HTN (hypertension), benign Hypertension Counseling:   The following hypertensive lifestyle modification were recommended and discussed:  1. Limiting alcohol intake to less than 1 oz/day of ethanol:(24 oz of beer or 8 oz of wine or 2 oz of 100-proof whiskey). 2. Take baby ASA 81 mg daily. 3. Importance of regular aerobic exercise and losing weight. 4. Reduce dietary saturated fat and cholesterol intake for overall cardiovascular health. 5. Maintaining adequate dietary potassium, calcium, and magnesium intake. 6. Regular monitoring of the blood pressure. 7. Reduce sodium intake to less than 100 mmol/day (less than 2.3 gm of sodium or less than 6 gm of sodium choride)      General Counseling: I have discussed the findings of the evaluation and examination with [place].  I have also discussed any further diagnostic evaluation thatmay be needed or ordered today. Julie verbalizes understanding of the findings of todays visit. We also reviewed his medications today and discussed drug interactions and side effects including but not limited excessive drowsiness and altered mental states. We also discussed that there is always a risk not just to him but also people around him. he has been encouraged to call the office with any questions or concerns that should arise related to todays visit.  No orders of the defined types were placed in this encounter.       I have personally obtained a history, evaluated the patient, evaluated pertinent data, formulated the assessment and plan and placed orders.   This patient was seen today by Emmaline Kluver, PA-C in collaboration with Dr. Freda Munro.   Yevonne Pax, MD [facility] Diplomate ABMS Pulmonary and Critical Care Medicine Sleep medicine

## 2022-08-05 ENCOUNTER — Ambulatory Visit (INDEPENDENT_AMBULATORY_CARE_PROVIDER_SITE_OTHER): Payer: Medicare Other | Admitting: Internal Medicine

## 2022-08-05 VITALS — BP 141/71 | HR 61 | Resp 16 | Ht 69.0 in | Wt 219.0 lb

## 2022-08-05 DIAGNOSIS — Z7189 Other specified counseling: Secondary | ICD-10-CM

## 2022-08-05 DIAGNOSIS — G4733 Obstructive sleep apnea (adult) (pediatric): Secondary | ICD-10-CM | POA: Diagnosis not present

## 2022-08-05 DIAGNOSIS — I10 Essential (primary) hypertension: Secondary | ICD-10-CM

## 2022-08-05 NOTE — Patient Instructions (Signed)
Living With Sleep Apnea Sleep apnea is a condition in which breathing pauses or becomes shallow during sleep. Sleep apnea is most commonly caused by a collapsed or blocked airway. People with sleep apnea usually snore loudly. They may have times when they gasp and stop breathing for 10 seconds or more during sleep. This may happen many times during the night. The breaks in breathing also interrupt the deep sleep that you need to feel rested. Even if you do not completely wake up from the gaps in breathing, your sleep may not be restful and you feel tired during the day. You may also have a headache in the morning and low energy during the day, and you may feel anxious or depressed. How can sleep apnea affect me? Sleep apnea increases your chances of extreme tiredness during the day (daytime fatigue). It can also increase your risk for health conditions, such as: Heart attack. Stroke. Obesity. Type 2 diabetes. Heart failure. Irregular heartbeat. High blood pressure. If you have daytime fatigue as a result of sleep apnea, you may be more likely to: Perform poorly at school or work. Fall asleep while driving. Have difficulty with attention. Develop depression or anxiety. Have sexual dysfunction. What actions can I take to manage sleep apnea? Sleep apnea treatment  If you were given a device to open your airway while you sleep, use it only as told by your health care provider. You may be given: An oral appliance. This is a custom-made mouthpiece that shifts your lower jaw forward. A continuous positive airway pressure (CPAP) device. This device blows air through a mask when you breathe out (exhale). A nasal expiratory positive airway pressure (EPAP) device. This device has valves that you put into each nostril. A bi-level positive airway pressure (BIPAP) device. This device blows air through a mask when you breathe in (inhale) and breathe out (exhale). You may need surgery if other treatments  do not work for you. Sleep habits Go to sleep and wake up at the same time every day. This helps set your internal clock (circadian rhythm) for sleeping. If you stay up later than usual, such as on weekends, try to get up in the morning within 2 hours of your normal wake time. Try to get at least 7-9 hours of sleep each night. Stop using a computer, tablet, and mobile phone a few hours before bedtime. Do not take long naps during the day. If you nap, limit it to 30 minutes. Have a relaxing bedtime routine. Reading or listening to music may relax you and help you sleep. Use your bedroom only for sleep. Keep your television and computer out of your bedroom. Keep your bedroom cool, dark, and quiet. Use a supportive mattress and pillows. Follow your health care provider's instructions for other changes to sleep habits. Nutrition Do not eat heavy meals in the evening. Do not have caffeine in the later part of the day. The effects of caffeine can last for more than 5 hours. Follow your health care provider's or dietitian's instructions for any diet changes. Lifestyle     Do not drink alcohol before bedtime. Alcohol can cause you to fall asleep at first, but then it can cause you to wake up in the middle of the night and have trouble getting back to sleep. Do not use any products that contain nicotine or tobacco. These products include cigarettes, chewing tobacco, and vaping devices, such as e-cigarettes. If you need help quitting, ask your health care provider. Medicines Take   over-the-counter and prescription medicines only as told by your health care provider. Do not use over-the-counter sleep medicine. You can become dependent on this medicine, and it can make sleep apnea worse. Do not use medicines, such as sedatives and narcotics, unless told by your health care provider. Activity Exercise on most days, but avoid exercising in the evening. Exercising near bedtime can interfere with  sleeping. If possible, spend time outside every day. Natural light helps regulate your circadian rhythm. General information Lose weight if you need to, and maintain a healthy weight. Keep all follow-up visits. This is important. If you are having surgery, make sure to tell your health care provider that you have sleep apnea. You may need to bring your device with you. Where to find more information Learn more about sleep apnea and daytime fatigue from: American Sleep Association: sleepassociation.org National Sleep Foundation: sleepfoundation.org National Heart, Lung, and Blood Institute: nhlbi.nih.gov Summary Sleep apnea is a condition in which breathing pauses or becomes shallow during sleep. Sleep apnea can cause daytime fatigue and other serious health conditions. You may need to wear a device while sleeping to help keep your airway open. If you are having surgery, make sure to tell your health care provider that you have sleep apnea. You may need to bring your device with you. Making changes to sleep habits, diet, lifestyle, and activity can help you manage sleep apnea. This information is not intended to replace advice given to you by your health care provider. Make sure you discuss any questions you have with your health care provider. Document Revised: 09/13/2020 Document Reviewed: 01/14/2020 Elsevier Patient Education  2023 Elsevier Inc.  

## 2022-09-03 DIAGNOSIS — G4733 Obstructive sleep apnea (adult) (pediatric): Secondary | ICD-10-CM | POA: Diagnosis not present

## 2022-10-04 ENCOUNTER — Encounter: Payer: Self-pay | Admitting: Family Medicine

## 2022-10-04 DIAGNOSIS — G4733 Obstructive sleep apnea (adult) (pediatric): Secondary | ICD-10-CM | POA: Diagnosis not present

## 2022-10-09 ENCOUNTER — Other Ambulatory Visit: Payer: Medicare Other

## 2022-10-09 DIAGNOSIS — C61 Malignant neoplasm of prostate: Secondary | ICD-10-CM

## 2022-10-10 LAB — PSA: Prostate Specific Ag, Serum: <0.1 ng/mL (ref 0.0–4.0)

## 2022-10-14 ENCOUNTER — Other Ambulatory Visit: Payer: Self-pay | Admitting: Family Medicine

## 2022-10-14 DIAGNOSIS — K922 Gastrointestinal hemorrhage, unspecified: Secondary | ICD-10-CM

## 2022-10-14 DIAGNOSIS — I10 Essential (primary) hypertension: Secondary | ICD-10-CM

## 2022-10-15 NOTE — Telephone Encounter (Signed)
Requested by interface surescripts. Future visit in 1 month.  Requested Prescriptions  Pending Prescriptions Disp Refills   sertraline (ZOLOFT) 50 MG tablet [Pharmacy Med Name: SERTRALINE HCL 50 MG TABLET] 90 tablet 0    Sig: Take 1 tablet (50 mg total) by mouth daily.     Psychiatry:  Antidepressants - SSRI - sertraline Passed - 10/14/2022  4:59 PM      Passed - AST in normal range and within 360 days    AST  Date Value Ref Range Status  04/22/2022 16 0 - 40 IU/L Final   AST (SGOT) Piccolo, Waived  Date Value Ref Range Status  12/02/2017 36 11 - 38 U/L Final         Passed - ALT in normal range and within 360 days    ALT  Date Value Ref Range Status  04/22/2022 20 0 - 44 IU/L Final   ALT (SGPT) Piccolo, Waived  Date Value Ref Range Status  12/02/2017 48 (H) 10 - 47 U/L Final         Passed - Completed PHQ-2 or PHQ-9 in the last 360 days      Passed - Valid encounter within last 6 months    Recent Outpatient Visits           5 months ago Aortic atherosclerosis (HCC)   [place] [facility] [place], Megan P, DO   10 months ago Acute anxiety   [place] [facility] [place], [place]lim, DO   11 months ago Need for influenza vaccination   [place] [facility] [place], Megan P, DO   1 year ago Irritability   [place] [facility] [place], Megan P, DO   1 year ago HTN (hypertension), benign   [place] [facility] Dorcas Carrow, DO       Future Appointments             In 2 weeks Laural Benes, Oralia Rud, DO [place] [facility]

## 2022-10-15 NOTE — Telephone Encounter (Signed)
Requested Prescriptions  Pending Prescriptions Disp Refills   amLODipine (NORVASC) 10 MG tablet [Pharmacy Med Name: AMLODIPINE BESYLATE 10 MG TAB] 90 tablet 0    Sig: Take 1 tablet (10 mg total) by mouth daily.     Cardiovascular: Calcium Channel Blockers 2 Failed - 10/14/2022  4:58 PM      Failed - Last BP in normal range    BP Readings from Last 1 Encounters:  08/05/22 (!) 141/71         Passed - Last Heart Rate in normal range    Pulse Readings from Last 1 Encounters:  08/05/22 61         Passed - Valid encounter within last 6 months    Recent Outpatient Visits           5 months ago Aortic atherosclerosis (HCC)   [place] [facility] [place], Megan P, DO   10 months ago Acute anxiety   [place] [facility] [place], Megan P, DO   11 months ago Need for influenza vaccination   [place] [facility] [place], Megan P, DO   1 year ago Irritability   [place] [facility] [place], Megan P, DO   1 year ago HTN (hypertension), benign   [place] [facility] [place], [place], DO       Future Appointments             In 2 weeks Laural Benes, Oralia Rud, DO [place] [facility]             hydrochlorothiazide (HYDRODIURIL) 25 MG tablet [Pharmacy Med Name: HYDROCHLOROTHIAZIDE 25 MG TAB] 90 tablet 0    Sig: 1 tab daily     Cardiovascular: Diuretics - Thiazide Failed - 10/14/2022  4:58 PM      Failed - Last BP in normal range    BP Readings from Last 1 Encounters:  08/05/22 (!) 141/71         Passed - Cr in normal range and within 180 days    Creatinine, Ser  Date Value Ref Range Status  04/22/2022 0.87 0.76 - 1.27 mg/dL Final         Passed - K in normal range and within 180 days    Potassium  Date Value Ref Range Status  04/22/2022 4.2 3.5 - 5.2 mmol/L Final         Passed - Na in normal range and within 180 days    Sodium  Date Value Ref Range Status  04/22/2022 140  134 - 144 mmol/L Final         Passed - Valid encounter within last 6 months    Recent Outpatient Visits           5 months ago Aortic atherosclerosis (HCC)   [place] [facility] [place], Megan P, DO   10 months ago Acute anxiety   [place] [facility] [place], [place], DO   11 months ago Need for influenza vaccination   [place] [facility] [place], [place], DO   1 year ago Irritability   [place] [facility] [place], Megan P, DO   1 year ago HTN (hypertension), benign    [facility] [place], DO       Future Appointments             In 2 weeks Laural Benes, Oralia Rud, DO  [facility],  [facility]             pantoprazole (PROTONIX) 40 MG tablet [Pharmacy Med Name: PANTOPRAZOLE SOD DR 40 MG TAB] 90 tablet 0    Sig: Take 1 tablet (40 mg total) by mouth daily.     Gastroenterology: Proton Pump Inhibitors Passed - 10/14/2022  4:58 PM      Passed - Valid encounter within last 12 months    Recent Outpatient Visits           5 months ago Aortic atherosclerosis [facility])   [place] [facility] [place], Megan P, DO   10 months ago Acute anxiety   [place] [facility] [place], Megan P, DO   11 months ago Need for influenza vaccination   [place] [facility] [place], Megan P, DO   1 year ago Irritability   [place] [facility] [place], Megan P, DO   1 year ago HTN (hypertension), benign   [place] [facility] Dor[place] C[place], DO       Future Appointments             In 2 weeks Laural Benes, Oralia Rud, DO  [facility], [facility]

## 2022-10-24 ENCOUNTER — Telehealth: Payer: Self-pay | Admitting: Urology

## 2022-10-24 ENCOUNTER — Ambulatory Visit: Payer: Medicare Other | Admitting: Family Medicine

## 2022-10-24 NOTE — Telephone Encounter (Signed)
Would you call Jonathan Burns and give him this message and get him scheduled for his appointment?  Jonathan Burns, Your PSA remains undetectable.  I need to see you back in the office in 6 months for another PSA.  Please call the office [phone]) or request an appointment through MyChart.   Regards, Islay Polanco, PA-C

## 2022-10-28 ENCOUNTER — Other Ambulatory Visit: Payer: Self-pay

## 2022-10-28 DIAGNOSIS — C61 Malignant neoplasm of prostate: Secondary | ICD-10-CM

## 2022-10-28 NOTE — Telephone Encounter (Signed)
Pt aware.   Appt made 3/11 lab appt. Lab ordered. 3/19 appt with SM 8am.

## 2022-11-01 ENCOUNTER — Encounter: Payer: Self-pay | Admitting: Family Medicine

## 2022-11-01 ENCOUNTER — Ambulatory Visit (INDEPENDENT_AMBULATORY_CARE_PROVIDER_SITE_OTHER): Payer: Medicare Other | Admitting: Family Medicine

## 2022-11-01 VITALS — BP 136/72 | HR 49 | Temp 97.9°F | Wt 212.8 lb

## 2022-11-01 DIAGNOSIS — N401 Enlarged prostate with lower urinary tract symptoms: Secondary | ICD-10-CM | POA: Diagnosis not present

## 2022-11-01 DIAGNOSIS — K922 Gastrointestinal hemorrhage, unspecified: Secondary | ICD-10-CM

## 2022-11-01 DIAGNOSIS — I700 Atherosclerosis of aorta: Secondary | ICD-10-CM

## 2022-11-01 DIAGNOSIS — D508 Other iron deficiency anemias: Secondary | ICD-10-CM | POA: Diagnosis not present

## 2022-11-01 DIAGNOSIS — R918 Other nonspecific abnormal finding of lung field: Secondary | ICD-10-CM | POA: Insufficient documentation

## 2022-11-01 DIAGNOSIS — N138 Other obstructive and reflux uropathy: Secondary | ICD-10-CM | POA: Diagnosis not present

## 2022-11-01 DIAGNOSIS — I10 Essential (primary) hypertension: Secondary | ICD-10-CM | POA: Diagnosis not present

## 2022-11-01 DIAGNOSIS — Z23 Encounter for immunization: Secondary | ICD-10-CM

## 2022-11-01 DIAGNOSIS — F419 Anxiety disorder, unspecified: Secondary | ICD-10-CM | POA: Diagnosis not present

## 2022-11-01 DIAGNOSIS — E785 Hyperlipidemia, unspecified: Secondary | ICD-10-CM

## 2022-11-01 DIAGNOSIS — J42 Unspecified chronic bronchitis: Secondary | ICD-10-CM

## 2022-11-01 DIAGNOSIS — C61 Malignant neoplasm of prostate: Secondary | ICD-10-CM

## 2022-11-01 LAB — MICROALBUMIN / CREATININE URINE RATIO
Creatinine, Urine: 100 mg/dL
Microalb/Creat Ratio: <30 mg/g{creat} (ref 0–29)
Microalbumin, Urine: 10 ug/mL

## 2022-11-01 MED ORDER — TAMSULOSIN HCL 0.4 MG PO CAPS
0.4000 mg | ORAL_CAPSULE | Freq: Every day | ORAL | 3 refills | Status: DC
Start: 2022-11-01 — End: 2023-11-19

## 2022-11-01 MED ORDER — HYDROCHLOROTHIAZIDE 25 MG PO TABS: ORAL_TABLET | ORAL | 1 refills | Status: DC

## 2022-11-01 MED ORDER — LORAZEPAM 0.5 MG PO TABS: 0.5000 mg | ORAL_TABLET | Freq: Every day | ORAL | 0 refills | Status: DC | PRN

## 2022-11-01 MED ORDER — CARVEDILOL 25 MG PO TABS: 25.0000 mg | ORAL_TABLET | Freq: Two times a day (BID) | ORAL | 1 refills | Status: DC

## 2022-11-01 MED ORDER — PANTOPRAZOLE SODIUM 40 MG PO TBEC: 40.0000 mg | DELAYED_RELEASE_TABLET | Freq: Every day | ORAL | 1 refills | Status: DC

## 2022-11-01 MED ORDER — LOSARTAN POTASSIUM 100 MG PO TABS
ORAL_TABLET | ORAL | 1 refills | Status: DC
Start: 2022-11-01 — End: 2023-05-01

## 2022-11-01 MED ORDER — SERTRALINE HCL 50 MG PO TABS: 50.0000 mg | ORAL_TABLET | Freq: Every day | ORAL | 1 refills | Status: DC

## 2022-11-01 MED ORDER — AMLODIPINE BESYLATE 10 MG PO TABS: 10.0000 mg | ORAL_TABLET | Freq: Every day | ORAL | 1 refills | Status: DC

## 2022-11-01 NOTE — Progress Notes (Unsigned)
BP 136/72   Pulse (!) 49   Temp 97.9 F (36.6 C) (Oral)   Wt 212 lb 12.8 oz (96.5 kg)   SpO2 97%   BMI 31.43 kg/m    Subjective:    Patient ID: Jonathan Burns, male    DOB: 1946/01/25, 77 y.o.   MRN: [id number]  HPI: Jonathan Burns is a 77 y.o. male  Chief Complaint  Patient presents with   Hypertension   HYPERTENSION / HYPERLIPIDEMIA Satisfied with current treatment? yes Duration of hypertension: chronic BP monitoring frequency: not checking BP medication side effects: no Past BP meds: amlodipine, carvedilol, losartan, HCTZ Duration of hyperlipidemia: chronic Cholesterol medication side effects: no Cholesterol supplements: fish oil Past cholesterol medications: none Medication compliance: excellent compliance Aspirin: no Recent stressors: no Recurrent headaches: no Visual changes: no Palpitations: no Dyspnea: no Chest pain: no Lower extremity edema: no Dizzy/lightheaded: no  DEPRESSION Mood status: stable Satisfied with current treatment?: yes Symptom severity: mild  Duration of current treatment : chronic Side effects: no Medication compliance: excellent compliance Psychotherapy/counseling: no  Previous psychiatric medications: sertraline Depressed mood: no Anxious mood: no Anhedonia: no Significant weight loss or gain: no Insomnia: no  Fatigue: yes Feelings of worthlessness or guilt: no Impaired concentration/indecisiveness: no Suicidal ideations: no Hopelessness: no Crying spells: no    11/01/2022   10:52 AM 06/17/2022   11:05 AM 04/22/2022    8:04 AM 12/19/2021    9:28 AM 11/15/2021    8:26 AM  Depression screen PHQ 2/9  Decreased Interest 0 0 0 0 0  Down, Depressed, Hopeless 0 0 0 0 1  PHQ - 2 Score 0 0 0 0 1  Altered sleeping 0 0 0 0 0  Tired, decreased energy 1 0 0 0 0  Change in appetite 0 0 0 0 0  Feeling bad or failure about yourself  0 0 0 0 0  Trouble concentrating 0 0 0 0 0  Moving slowly or fidgety/restless 0 0 0 0 0  Suicidal  thoughts 0 0 0 0 0  PHQ-9 Score 1 0 0 0 1  Difficult doing work/chores Not difficult at all Not difficult at all Not difficult at all  Not difficult at all     Relevant past medical, surgical, family and social history reviewed and updated as indicated. Interim medical history since our last visit reviewed. Allergies and medications reviewed and updated.  Review of Systems  Constitutional: Negative.   Respiratory: Negative.    Cardiovascular: Negative.   Gastrointestinal: Negative.   Musculoskeletal: Negative.   Neurological: Negative.   Psychiatric/Behavioral: Negative.      Per HPI unless specifically indicated above     Objective:    BP 136/72   Pulse (!) 49   Temp 97.9 F (36.6 C) (Oral)   Wt 212 lb 12.8 oz (96.5 kg)   SpO2 97%   BMI 31.43 kg/m   Wt Readings from Last 3 Encounters:  11/01/22 212 lb 12.8 oz (96.5 kg)  08/05/22 219 lb (99.3 kg)  06/17/22 216 lb (98 kg)    Physical Exam Vitals and nursing note reviewed.  Constitutional:      General: He is not in acute distress.    Appearance: Normal appearance. He is not ill-appearing, toxic-appearing or diaphoretic.  HENT:     Head: Normocephalic and atraumatic.     Right Ear: External ear normal.     Left Ear: External ear normal.     Nose: Nose normal.  Mouth/Throat:     Mouth: Mucous membranes are moist.     Pharynx: Oropharynx is clear.  Eyes:     General: No scleral icterus.       Right eye: No discharge.        Left eye: No discharge.     Extraocular Movements: Extraocular movements intact.     Conjunctiva/sclera: Conjunctivae normal.     Pupils: Pupils are equal, round, and reactive to light.  Cardiovascular:     Rate and Rhythm: Normal rate and regular rhythm.     Pulses: Normal pulses.     Heart sounds: Normal heart sounds. No murmur heard.    No friction rub. No gallop.  Pulmonary:     Effort: Pulmonary effort is normal. No respiratory distress.     Breath sounds: Normal breath sounds.  No stridor. No wheezing, rhonchi or rales.  Chest:     Chest wall: No tenderness.  Musculoskeletal:        General: Normal range of motion.     Cervical back: Normal range of motion and neck supple.  Skin:    General: Skin is warm and dry.     Capillary Refill: Capillary refill takes less than 2 seconds.     Coloration: Skin is not jaundiced or pale.     Findings: No bruising, erythema, lesion or rash.  Neurological:     General: No focal deficit present.     Mental Status: He is alert and oriented to person, place, and time. Mental status is at baseline.  Psychiatric:        Mood and Affect: Mood normal.        Behavior: Behavior normal.        Thought Content: Thought content normal.        Judgment: Judgment normal.     Results for orders placed or performed in visit on 11/01/22  Microscopic Examination   Urine  Result Value Ref Range   WBC, UA None seen 0 - 5 /hpf   RBC, Urine None seen 0 - 2 /hpf   Epithelial Cells (non renal) None seen 0 - 10 /hpf   Bacteria, UA None seen None seen/Few  Comprehensive metabolic panel  Result Value Ref Range   Glucose 101 (H) 70 - 99 mg/dL   BUN 14 8 - 27 mg/dL   Creatinine, Ser 8.29 0.76 - 1.27 mg/dL   eGFR 89 >56 OZ/HYQ/6.57   BUN/Creatinine Ratio 16 10 - 24   Sodium 137 134 - 144 mmol/L   Potassium 4.4 3.5 - 5.2 mmol/L   Chloride 99 96 - 106 mmol/L   CO2 25 20 - 29 mmol/L   Calcium 9.5 8.6 - 10.2 mg/dL   Total Protein 6.6 6.0 - 8.5 g/dL   Albumin 4.5 3.8 - 4.8 g/dL   Globulin, Total 2.1 1.5 - 4.5 g/dL   Bilirubin Total 0.6 0.0 - 1.2 mg/dL   Alkaline Phosphatase 95 44 - 121 IU/L   AST 20 0 - 40 IU/L   ALT 17 0 - 44 IU/L  CBC with Differential/Platelet  Result Value Ref Range   WBC 5.5 3.4 - 10.8 x10E3/uL   RBC 4.64 4.14 - 5.80 x10E6/uL   Hemoglobin 14.5 13.0 - 17.7 g/dL   Hematocrit 84.6 96.2 - 51.0 %   MCV 95 79 - 97 fL   MCH 31.3 26.6 - 33.0 pg   MCHC 32.8 31.5 - 35.7 g/dL   RDW 95.2 84.1 - 32.4 %  Platelets 184 150  - 450 x10E3/uL   Neutrophils 64 Not Estab. %   Lymphs 16 Not Estab. %   Monocytes 15 Not Estab. %   Eos 4 Not Estab. %   Basos 1 Not Estab. %   Neutrophils Absolute 3.6 1.4 - 7.0 x10E3/uL   Lymphocytes Absolute 0.9 0.7 - 3.1 x10E3/uL   Monocytes Absolute 0.8 0.1 - 0.9 x10E3/uL   EOS (ABSOLUTE) 0.2 0.0 - 0.4 x10E3/uL   Basophils Absolute 0.0 0.0 - 0.2 x10E3/uL   Immature Granulocytes 0 Not Estab. %   Immature Grans (Abs) 0.0 0.0 - 0.1 x10E3/uL  Lipid Panel w/o Chol/HDL Ratio  Result Value Ref Range   Cholesterol, Total 187 100 - 199 mg/dL   Triglycerides 409 0 - 149 mg/dL   HDL 64 >81 mg/dL   VLDL Cholesterol Cal 20 5 - 40 mg/dL   LDL Chol Calc (NIH) 191 (H) 0 - 99 mg/dL  TSH  Result Value Ref Range   TSH 2.500 0.450 - 4.500 uIU/mL  Urinalysis, Routine w reflex microscopic  Result Value Ref Range   Specific Gravity, UA 1.015 1.005 - 1.030   pH, UA 7.0 5.0 - 7.5   Color, UA Yellow Yellow   Appearance Ur Clear Clear   Leukocytes,UA Negative Negative   Protein,UA Negative Negative/Trace   Glucose, UA Negative Negative   Ketones, UA Negative Negative   RBC, UA Trace (A) Negative   Bilirubin, UA Negative Negative   Urobilinogen, Ur 0.2 0.2 - 1.0 mg/dL   Nitrite, UA Negative Negative   Microscopic Examination See below:   Iron Binding Cap (TIBC)([facility])  Result Value Ref Range   Total Iron Binding Capacity 327 250 - 450 ug/dL   UIBC 478 295 - 621 ug/dL   Iron 308 38 - 657 ug/dL   Iron Saturation 40 15 - 55 %  Ferritin  Result Value Ref Range   Ferritin 96 30 - 400 ng/mL  Urine Microalbumin w/creat. ratio  Result Value Ref Range   Creatinine, Urine 100.0 Not Estab. mg/dL   Microalbumin, Urine 84.6 Not Estab. ug/mL   Microalb/Creat Ratio <30 0 - 29 mg/g creat  PSA  Result Value Ref Range   Prostate Specific Ag, Serum <0.1 0.0 - 4.0 ng/mL  Microalbumin, Urine Waived  Result Value Ref Range   Microalb, Ur Waived 10 0 - 19 mg/L   Creatinine, Urine Waived 100 10  - 300 mg/dL   Microalb/Creat Ratio <30 <30 mg/g      Assessment & Plan:   Problem List Items Addressed This Visit       Cardiovascular and Mediastinum   HTN (hypertension), benign    Under good control on current regimen. Continue current regimen. Continue to monitor. Call with any concerns. Refills given. Labs drawn today.        Relevant Medications   amLODipine (NORVASC) 10 MG tablet   carvedilol (COREG) 25 MG tablet   hydrochlorothiazide (HYDRODIURIL) 25 MG tablet   losartan (COZAAR) 100 MG tablet   Other Relevant Orders   Comprehensive metabolic panel (Completed)   TSH (Completed)   Urine Microalbumin w/creat. ratio (Completed)   Aortic atherosclerosis (HCC) - Primary    Will keep BP and cholesterol under good control. Rechecking levels today. Continue to monitor.       Relevant Medications   amLODipine (NORVASC) 10 MG tablet   carvedilol (COREG) 25 MG tablet   hydrochlorothiazide (HYDRODIURIL) 25 MG tablet   losartan (COZAAR) 100 MG tablet  Other Relevant Orders   Comprehensive metabolic panel (Completed)     Respiratory   COPD (chronic obstructive pulmonary disease) (HCC)    Doing well not on medicine. Continue to monitor. Call with any concerns.       Relevant Orders   Comprehensive metabolic panel (Completed)   Pulmonary nodules    Due for repeat CT in November. Ordered today. Await results.       Relevant Orders   CT Chest Wo Contrast     Digestive   Chronic GI bleeding    Rechecking labs today. Await results.       Relevant Medications   pantoprazole (PROTONIX) 40 MG tablet     Genitourinary   BPH with obstruction/lower urinary tract symptoms    Labs drawn today. Await results. Continue to follow with urology.        Relevant Medications   tamsulosin (FLOMAX) 0.4 MG CAPS capsule   Other Relevant Orders   Comprehensive metabolic panel (Completed)   Urinalysis, Routine w reflex microscopic (Completed)   Prostate cancer (HCC)    Continue  to follow with urology. Call with any concerns.       Relevant Medications   LORazepam (ATIVAN) 0.5 MG tablet   Other Relevant Orders   Comprehensive metabolic panel (Completed)   Urinalysis, Routine w reflex microscopic (Completed)     Other   Hyperlipidemia    Under good control on current regimen. Continue current regimen. Continue to monitor. Call with any concerns. Refills given. Labs drawn today.       Relevant Medications   amLODipine (NORVASC) 10 MG tablet   carvedilol (COREG) 25 MG tablet   hydrochlorothiazide (HYDRODIURIL) 25 MG tablet   losartan (COZAAR) 100 MG tablet   Other Relevant Orders   Comprehensive metabolic panel (Completed)   Lipid Panel w/o Chol/HDL Ratio (Completed)   Acute anxiety    Under good control on current regimen. Continue current regimen. Continue to monitor. Call with any concerns. Refills given. 30 lorazepam should last about 6 months.        Relevant Medications   LORazepam (ATIVAN) 0.5 MG tablet   sertraline (ZOLOFT) 50 MG tablet   Iron (Fe) deficiency anemia    Rechecking labs today. Await results. Treat as needed.       Relevant Orders   Comprehensive metabolic panel (Completed)   CBC with Differential/Platelet (Completed)   Iron Binding Cap (TIBC)([facility]) (Completed)   Ferritin (Completed)   Other Visit Diagnoses     Essential hypertension       Relevant Medications   amLODipine (NORVASC) 10 MG tablet   carvedilol (COREG) 25 MG tablet   hydrochlorothiazide (HYDRODIURIL) 25 MG tablet   losartan (COZAAR) 100 MG tablet   Flu vaccine need       Flu shot given today.   Relevant Orders   Flu Vaccine Trivalent High Dose (Fluad) (Completed)        Follow up plan: Return in about 6 months (around 05/01/2023).

## 2022-11-01 NOTE — Assessment & Plan Note (Signed)
Under good control on current regimen. Continue current regimen. Continue to monitor. Call with any concerns. Refills given. 30 lorazepam should last about 6 months.

## 2022-11-02 LAB — CBC WITH DIFFERENTIAL/PLATELET
Basophils Absolute: 0 10*3/uL (ref 0.0–0.2)
Basos: 1 %
EOS (ABSOLUTE): 0.2 10*3/uL (ref 0.0–0.4)
Eos: 4 %
Hematocrit: 44.2 % (ref 37.5–51.0)
Hemoglobin: 14.5 g/dL (ref 13.0–17.7)
Immature Grans (Abs): 0 10*3/uL (ref 0.0–0.1)
Immature Granulocytes: 0 %
Lymphocytes Absolute: 0.9 10*3/uL (ref 0.7–3.1)
Lymphs: 16 %
MCH: 31.3 pg (ref 26.6–33.0)
MCHC: 32.8 g/dL (ref 31.5–35.7)
MCV: 95 fL (ref 79–97)
Monocytes Absolute: 0.8 10*3/uL (ref 0.1–0.9)
Monocytes: 15 %
Neutrophils Absolute: 3.6 10*3/uL (ref 1.4–7.0)
Neutrophils: 64 %
Platelets: 184 10*3/uL (ref 150–450)
RBC: 4.64 x10E6/uL (ref 4.14–5.80)
RDW: 13.1 % (ref 11.6–15.4)
WBC: 5.5 10*3/uL (ref 3.4–10.8)

## 2022-11-02 LAB — IRON AND TIBC
Iron Saturation: 40 % (ref 15–55)
Iron: 131 ug/dL (ref 38–169)
Total Iron Binding Capacity: 327 ug/dL (ref 250–450)
UIBC: 196 ug/dL (ref 111–343)

## 2022-11-02 LAB — COMPREHENSIVE METABOLIC PANEL
ALT: 17 IU/L (ref 0–44)
AST: 20 IU/L (ref 0–40)
Albumin: 4.5 g/dL (ref 3.8–4.8)
Alkaline Phosphatase: 95 IU/L (ref 44–121)
BUN/Creatinine Ratio: 16 (ref 10–24)
BUN: 14 mg/dL (ref 8–27)
Bilirubin Total: 0.6 mg/dL (ref 0.0–1.2)
CO2: 25 mmol/L (ref 20–29)
Calcium: 9.5 mg/dL (ref 8.6–10.2)
Chloride: 99 mmol/L (ref 96–106)
Creatinine, Ser: 0.85 mg/dL (ref 0.76–1.27)
Globulin, Total: 2.1 g/dL (ref 1.5–4.5)
Glucose: 101 mg/dL — ABNORMAL HIGH (ref 70–99)
Potassium: 4.4 mmol/L (ref 3.5–5.2)
Sodium: 137 mmol/L (ref 134–144)
Total Protein: 6.6 g/dL (ref 6.0–8.5)
eGFR: 89 mL/min/{1.73_m2} (ref >59)

## 2022-11-02 LAB — LIPID PANEL W/O CHOL/HDL RATIO
Cholesterol, Total: 187 mg/dL (ref 100–199)
HDL: 64 mg/dL (ref >39)
LDL Chol Calc (NIH): 103 mg/dL — ABNORMAL HIGH (ref 0–99)
Triglycerides: 110 mg/dL (ref 0–149)
VLDL Cholesterol Cal: 20 mg/dL (ref 5–40)

## 2022-11-02 LAB — FERRITIN: Ferritin: 96 ng/mL (ref 30–400)

## 2022-11-02 LAB — PSA: Prostate Specific Ag, Serum: <0.1 ng/mL (ref 0.0–4.0)

## 2022-11-02 LAB — TSH: TSH: 2.5 u[IU]/mL (ref 0.450–4.500)

## 2022-11-04 ENCOUNTER — Encounter: Payer: Self-pay | Admitting: Family Medicine

## 2022-11-04 LAB — URINALYSIS, ROUTINE W REFLEX MICROSCOPIC
Bilirubin, UA: NEGATIVE
Glucose, UA: NEGATIVE
Ketones, UA: NEGATIVE
Leukocytes,UA: NEGATIVE
Nitrite, UA: NEGATIVE
Protein,UA: NEGATIVE
Specific Gravity, UA: 1.015 (ref 1.005–1.030)
Urobilinogen, Ur: 0.2 mg/dL (ref 0.2–1.0)
pH, UA: 7 (ref 5.0–7.5)

## 2022-11-04 LAB — MICROSCOPIC EXAMINATION
Bacteria, UA: NONE SEEN
Epithelial Cells (non renal): NONE SEEN /HPF (ref 0–10)
RBC, Urine: NONE SEEN /HPF (ref 0–2)
WBC, UA: NONE SEEN /HPF (ref 0–5)

## 2022-11-04 LAB — MICROALBUMIN, URINE WAIVED
Creatinine, Urine Waived: 100 mg/dL (ref 10–300)
Microalb, Ur Waived: 10 mg/L (ref 0–19)
Microalb/Creat Ratio: <30 mg/g (ref <30)

## 2022-11-04 NOTE — Assessment & Plan Note (Signed)
Will keep BP and cholesterol under good control. Rechecking levels today. Continue to monitor.

## 2022-11-04 NOTE — Assessment & Plan Note (Signed)
Rechecking labs today. Await results. Treat as needed.  °

## 2022-11-04 NOTE — Assessment & Plan Note (Signed)
Doing well not on medicine. Continue to monitor. Call with any concerns.

## 2022-11-04 NOTE — Assessment & Plan Note (Signed)
Under good control on current regimen. Continue current regimen. Continue to monitor. Call with any concerns. Refills given. Labs drawn today.   

## 2022-11-04 NOTE — Assessment & Plan Note (Signed)
Due for repeat CT in November. Ordered today. Await results.

## 2022-11-04 NOTE — Assessment & Plan Note (Signed)
Continue to follow with urology. Call with any concerns.

## 2022-11-04 NOTE — Assessment & Plan Note (Signed)
Rechecking labs today. Await results.  

## 2022-11-04 NOTE — Assessment & Plan Note (Signed)
Labs drawn today. Await results. Continue to follow with urology.

## 2022-11-12 ENCOUNTER — Encounter: Payer: Self-pay | Admitting: Family Medicine

## 2022-12-06 DIAGNOSIS — C44329 Squamous cell carcinoma of skin of other parts of face: Secondary | ICD-10-CM | POA: Diagnosis not present

## 2022-12-06 DIAGNOSIS — L570 Actinic keratosis: Secondary | ICD-10-CM | POA: Diagnosis not present

## 2022-12-06 DIAGNOSIS — D485 Neoplasm of uncertain behavior of skin: Secondary | ICD-10-CM | POA: Diagnosis not present

## 2022-12-09 DIAGNOSIS — H33312 Horseshoe tear of retina without detachment, left eye: Secondary | ICD-10-CM | POA: Diagnosis not present

## 2022-12-09 DIAGNOSIS — H43813 Vitreous degeneration, bilateral: Secondary | ICD-10-CM | POA: Diagnosis not present

## 2022-12-09 DIAGNOSIS — H2513 Age-related nuclear cataract, bilateral: Secondary | ICD-10-CM | POA: Diagnosis not present

## 2022-12-09 DIAGNOSIS — H353131 Nonexudative age-related macular degeneration, bilateral, early dry stage: Secondary | ICD-10-CM | POA: Diagnosis not present

## 2022-12-09 DIAGNOSIS — B0050 Herpesviral ocular disease, unspecified: Secondary | ICD-10-CM | POA: Diagnosis not present

## 2023-01-01 DIAGNOSIS — C44329 Squamous cell carcinoma of skin of other parts of face: Secondary | ICD-10-CM | POA: Diagnosis not present

## 2023-01-01 DIAGNOSIS — C44320 Squamous cell carcinoma of skin of unspecified parts of face: Secondary | ICD-10-CM | POA: Diagnosis not present

## 2023-01-20 ENCOUNTER — Ambulatory Visit
Admission: RE | Admit: 2023-01-20 | Discharge: 2023-01-20 | Disposition: A | Discharge status: Home / Self Care | Payer: Medicare Other | Source: Ambulatory Visit | Attending: Family Medicine | Admitting: Family Medicine

## 2023-01-20 DIAGNOSIS — R918 Other nonspecific abnormal finding of lung field: Secondary | ICD-10-CM | POA: Insufficient documentation

## 2023-01-20 DIAGNOSIS — R911 Solitary pulmonary nodule: Secondary | ICD-10-CM | POA: Diagnosis not present

## 2023-01-20 DIAGNOSIS — I700 Atherosclerosis of aorta: Secondary | ICD-10-CM | POA: Diagnosis not present

## 2023-01-20 DIAGNOSIS — J90 Pleural effusion, not elsewhere classified: Secondary | ICD-10-CM | POA: Diagnosis not present

## 2023-01-31 ENCOUNTER — Telehealth: Payer: Self-pay | Admitting: Family Medicine

## 2023-01-31 DIAGNOSIS — R918 Other nonspecific abnormal finding of lung field: Secondary | ICD-10-CM

## 2023-01-31 MED ORDER — AZITHROMYCIN 250 MG PO TABS: ORAL_TABLET | ORAL | 0 refills | Status: AC

## 2023-01-31 NOTE — Telephone Encounter (Signed)
Called patient with results. He had a head cold about a couple of weeks ago, but otherwise has not been sick. CT suggest atypical infection- will treat with azithromycin and recheck CXR in about 6 weeks. Call with any concerns.

## 2023-02-05 DIAGNOSIS — Z872 Personal history of diseases of the skin and subcutaneous tissue: Secondary | ICD-10-CM | POA: Diagnosis not present

## 2023-02-05 DIAGNOSIS — L570 Actinic keratosis: Secondary | ICD-10-CM | POA: Diagnosis not present

## 2023-02-05 DIAGNOSIS — L578 Other skin changes due to chronic exposure to nonionizing radiation: Secondary | ICD-10-CM | POA: Diagnosis not present

## 2023-02-05 DIAGNOSIS — Z859 Personal history of malignant neoplasm, unspecified: Secondary | ICD-10-CM | POA: Diagnosis not present

## 2023-02-05 DIAGNOSIS — Z86018 Personal history of other benign neoplasm: Secondary | ICD-10-CM | POA: Diagnosis not present

## 2023-04-25 NOTE — Progress Notes (Signed)
 [facility] [address] [place], [place] [address]  Pulmonary Sleep Medicine   Office Visit Note  Patient Name: Jonathan Burns DOB: 04-01-1945 MRN [id number]    Chief Complaint: Obstructive Sleep Apnea visit  Brief History:  Jonathan Burns is seen today for a follow up visit for APAP@ 12-17 cmH2O. The patient has a 8 year history of sleep apnea. Patient is using PAP nightly.  The patient feels rested after sleeping with PAP.  The patient reports benefiting from PAP use. Reported sleepiness is  improved and the Epworth Sleepiness Score is 2 out of 24. The patient very seldom will take naps. The patient complains of the following: some bloating.  The compliance download shows 99% compliance with an average use time of 7 hours 11 minutes. The AHI is 2.3. The patient does not complain of limb movements disrupting sleep. The patient continues to require PAP therapy in order to eliminate sleep apnea.   ROS  General: (-) fever, (-) chills, (-) night sweat Nose and Sinuses: (-) nasal stuffiness or itchiness, (-) postnasal drip, (-) nosebleeds, (-) sinus trouble. Mouth and Throat: (-) sore throat, (-) hoarseness. Neck: (-) swollen glands, (-) enlarged thyroid, (-) neck pain. Respiratory: - cough, - shortness of breath, - wheezing. Neurologic: - numbness, - tingling. Psychiatric: - anxiety, - depression   Current Medication: Outpatient Encounter Medications as of 04/28/2023  Medication Sig Note   amLODipine (NORVASC) 10 MG tablet Take 1 tablet (10 mg total) by mouth daily.    baclofen (LIORESAL) 10 MG tablet Take 1 tablet (10 mg total) by mouth at bedtime.    carvedilol (COREG) 25 MG tablet Take 1 tablet (25 mg total) by mouth 2 (two) times daily with a meal.    Ferrous Sulfate (IRON) 325 (65 Fe) MG TABS Take 1 tablet (325 mg total) by mouth daily. 06/17/2022: Takes two times per week    hydrochlorothiazide (HYDRODIURIL) 25 MG tablet 1 tab daily    LORazepam (ATIVAN) 0.5 MG tablet Take  1 tablet (0.5 mg total) by mouth daily as needed for anxiety.    losartan (COZAAR) 100 MG tablet Take 1 tablet (100 mg total) by mouth daily.    Multiple Vitamin (MULTIVITAMIN) tablet Take 1 tablet by mouth daily.    Multiple Vitamins-Minerals (PRESERVISION AREDS PO) Take 1 tablet by mouth 2 (two) times daily.    Omega-3 1000 MG CAPS Take 1 capsule by mouth daily.    pantoprazole (PROTONIX) 40 MG tablet Take 1 tablet (40 mg total) by mouth daily.    sertraline (ZOLOFT) 50 MG tablet Take 1 tablet (50 mg total) by mouth daily.    tamsulosin (FLOMAX) 0.4 MG CAPS capsule Take 1 capsule (0.4 mg total) by mouth daily.    valACYclovir (VALTREX) 500 MG tablet Take 500 mg by mouth daily.    No facility-administered encounter medications on file as of 04/28/2023.    Surgical History: Past Surgical History:  Procedure Laterality Date   COLONOSCOPY  2015   ESOPHAGOGASTRODUODENOSCOPY  2015   FLEXIBLE BRONCHOSCOPY Left 01/20/2019   Procedure: FLEXIBLE BRONCHOSCOPY;  Surgeon: Hulda Marin, MD;  Location: [facility];  Service: Thoracic;  Laterality: Left;   THORACOTOMY Left 01/20/2019   Procedure: CONVERTED TO THORACOTOMY MAJOR;  Surgeon: Hulda Marin, MD;  Location: [facility];  Service: Thoracic;  Laterality: Left;   VIDEO ASSISTED THORACOSCOPY (VATS)/THOROCOTOMY Left 01/20/2019   Procedure: VIDEO ASSISTED THORACOSCOPY (VATS)/THOROCOTOMY;  Surgeon: Hulda Marin, MD;  Location: [facility];  Service: Thoracic;  Laterality: Left;  Medical History: Past Medical History:  Diagnosis Date   Anemia    BPH (benign prostatic hypertrophy)    Cancer (HCC)    Chronic GI bleeding    COPD (chronic obstructive pulmonary disease) (HCC)    Hyperlipidemia    Hypertension    Prostate nodule    PUD (peptic ulcer disease)    Rising PSA level     Family History: Non contributory to the present illness  Social History: Social History   Socioeconomic History   Marital status: Widowed    Spouse name: Not on file    Number of children: Not on file   Years of education: Not on file   Highest education level: Associate degree: academic program  Occupational History   Not on file  Tobacco Use   Smoking status: Former    Current packs/day: 0.00    Average packs/day: 1 pack/day for 30.0 years (30.0 ttl pk-yrs)    Types: Cigarettes    Start date: 08/16/1956    Quit date: 08/17/1986    Years since quitting: 36.7   Smokeless tobacco: Never  Vaping Use   Vaping status: Never Used  Substance and Sexual Activity   Alcohol use: Yes    Alcohol/week: 2.0 standard drinks of alcohol    Types: 2 Standard drinks or equivalent per week    Comment: twice a week, mixes up between drinks    Drug use: No   Sexual activity: Not on file  Other Topics Concern   Not on file  Social History Narrative   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    Involved in [place] activities    Social Drivers of Health   Financial Resource Strain: Low Risk  (04/27/2023)   Overall Financial Resource Strain (CARDIA)    Difficulty of Paying Living Expenses: Not hard at all  Food Insecurity: No Food Insecurity (04/27/2023)   Hunger Vital Sign    Worried About Running Out of Food in the Last Year: Never true    Ran Out of Food in the Last Year: Never true  Transportation Needs: No Transportation Needs (04/27/2023)   PRAPARE - Administrator, Civil Service (Medical): No    Lack of Transportation (Non-Medical): No  Physical Activity: Insufficiently Active (04/27/2023)   Exercise Vital Sign    Days of Exercise per Week: 3 days    Minutes of Exercise per Session: 10 min  Stress: No Stress Concern Present (04/27/2023)   [employer] of Occupational Health - Occupational Stress Questionnaire    Feeling of Stress : Not at all  Social Connections: Moderately Integrated (04/27/2023)   Social Connection and Isolation Panel [NHANES]    Frequency of Communication with Friends and Family: More than three times a week    Frequency of Social  Gatherings with Friends and Family: More than three times a week    Attends Religious Services: More than 4 times per year    Active Member of [employer] or Organizations: Yes    Attends Banker Meetings: More than 4 times per year    Marital Status: Widowed  Intimate Partner Violence: Not At Risk (06/17/2022)   Humiliation, Afraid, Rape, and Kick questionnaire    Fear of Current or Ex-Partner: No    Emotionally Abused: No    Physically Abused: No    Sexually Abused: No    Vital Signs: Blood pressure 132/77, pulse (!) 56, resp. rate 16, height 5\' 9"  (1.753 m), weight 219 lb (99.3 kg), SpO2 96%. Body mass  index is 32.34 kg/m.    Examination: General Appearance: The patient is well-developed, well-nourished, and in no distress. Neck Circumference: 46 cm Skin: Gross inspection of skin unremarkable. Head: normocephalic, no gross deformities. Eyes: no gross deformities noted. ENT: ears appear grossly normal Neurologic: Alert and oriented. No involuntary movements.  STOP BANG RISK ASSESSMENT S (snore) Have you been told that you snore?     NO   T (tired) Are you often tired, fatigued, or sleepy during the day?   NO  O (obstruction) Do you stop breathing, choke, or gasp during sleep? NO   P (pressure) Do you have or are you being treated for high blood pressure? YES   B (BMI) Is your body index greater than 35 kg/m? NO   A (age) Are you 55 years old or older? YES   N (neck) Do you have a neck circumference greater than 16 inches?   YES   G (gender) Are you a male? YES   TOTAL STOP/BANG "YES" ANSWERS 4       A STOP-Bang score of 2 or less is considered low risk, and a score of 5 or more is high risk for having either moderate or severe OSA. For people who score 3 or 4, doctors may need to perform further assessment to determine how likely they are to have OSA.         EPWORTH SLEEPINESS SCALE:  Scale:  (0)= no chance of dozing; (1)= slight chance of dozing; (2)=  moderate chance of dozing; (3)= high chance of dozing  Chance  Situtation    Sitting and reading: 0    Watching TV: 0    Sitting Inactive in public: 0    As a passenger in car: 1      Lying down to rest: 1    Sitting and talking: 0    Sitting quielty after lunch: 0    In a car, stopped in traffic: 0   TOTAL SCORE:   2 out of 24    SLEEP STUDIES:  PSG - 08/29/14 - AHI 8/hr, Supine AHI 38/hr, lateral AHI 20/hr, min Sp02 87%, PLMI 215.3 Titration - 08/30/2014 - APAP 12-17cm H20 Titration - 10/04/2022 - APAP 12-17cmH20   CPAP COMPLIANCE DATA:  Date Range: 01/23/2023-04/22/2023  Average Daily Use: 7 hours 11 minutes  Median Use: 7 hours 18 minutes  Compliance for > 4 Hours: 99%  AHI: 2.3 respiratory events per hour  Days Used: 90/90 days  Mask Leak: 49.6  95th Percentile Pressure: 14.6         LABS: No results found for this or any previous visit (from the past 2160 hours).  Radiology: CT Chest Wo Contrast Result Date: 01/31/2023 CLINICAL DATA:  Pulmonary nodule follow-up. EXAM: CT CHEST WITHOUT CONTRAST TECHNIQUE: Multidetector CT imaging of the chest was performed following the standard protocol without IV contrast. RADIATION DOSE REDUCTION: This exam was performed according to the departmental dose-optimization program which includes automated exposure control, adjustment of the mA and/or kV according to patient size and/or use of iterative reconstruction technique. COMPARISON:  Chest CT 01/17/2022, 03/26/2019, 01/14/2019. FINDINGS: Cardiovascular: The heart is normal in size. There are coronary artery calcifications. Atherosclerosis of the thoracic aorta without aneurysm. No pericardial effusion. Mediastinum/Nodes: Moderate hiatal hernia. No enlarged mediastinal lymph nodes. Scattered small mediastinal nodes are unchanged from prior exam, largest 9 mm in the anterior mediastinum. No visible thyroid nodule. Lungs/Pleura: Complete resolution of the linear  opacity in the right lower lobe at  site of previous nodule. Again seen pleuroparenchymal curvilinear scarring in the left lower lobe and dependent right lower lobe. Left basilar blebs are unchanged. There are new and increasing areas of clustered tree-in-bud opacity and ground-glass nodularity with an upper lobe predominance, for example right upper lobe series 3, images 43 through 52, left upper lobe series 3, images 44 through 55. This includes the areas of previous ground-glass nodularity, previous dominant left upper lobe 7 mm ground-glass nodule, series 3, image 40 is unchanged. Lesser areas of ground-glass nodularity in the lower lobes and right middle lobe. Trace right pleural effusion persists. Retained mucus within the trachea. Mild emphysema bronchial thickening. Upper Abdomen: Again seen hepatic steatosis and cyst in the left lobe of the liver. No specific imaging follow-up is needed. Calcified gallstones. Mild chronic elevation of left hemidiaphragm. Musculoskeletal: Multiple remote healed left rib fractures with partial bridging of the ribs, typical of posttraumatic change. No acute osseous findings or focal bone lesion. Moderate degenerative change in the spine. The bones are diffusely under mineralized. Exaggerated thoracic kyphosis. IMPRESSION: 1. New and increasing areas of clustered tree-in-bud opacity and ground-glass nodularity, greatest in the upper lobes, including the areas of previous ground-glass nodules. Favor infectious or inflammatory bronchiolitis or atypical infection. Findings are not highly suspicious for neoplasm, and follow-up should be based on clinical grounds. 2. Complete resolution of the linear opacity in the right lower lobe at site of previous nodule. 3. Trace right pleural effusion persists. 4. Moderate hiatal hernia. 5. Cholelithiasis. Aortic Atherosclerosis (ICD10-I70.0) and Emphysema (ICD10-J43.9). Electronically Signed   By: Narda Rutherford M.D.   On: 01/31/2023 14:51     No results found.  No results found.    Assessment and Plan: Patient Active Problem List   Diagnosis Date Noted   Pulmonary nodules 11/01/2022   Aortic atherosclerosis (HCC) 05/09/2020   CPAP use counseling 12/20/2019   Acquired trigger finger of left middle finger 06/16/2019   PUD (peptic ulcer disease)    Empyema (HCC) 01/20/2019   Hypokalemia    Empyema of left pleural space (HCC) 01/13/2019   GERD (gastroesophageal reflux disease) 08/25/2018   Iron (Fe) deficiency anemia 08/25/2018   Trigger finger, right middle finger 02/02/2018   Advanced care planning/counseling discussion 05/27/2017   Prostate cancer (HCC) 05/27/2017   BPH with obstruction/lower urinary tract symptoms 04/15/2015   Acute anxiety 02/23/2015   Prostate nodule 10/10/2014   OSA on CPAP 08/31/2014   Chronic GI bleeding 08/17/2014   Hyperlipidemia    HTN (hypertension), benign    COPD (chronic obstructive pulmonary disease) (HCC)    1. OSA on CPAP (Primary) The patient does tolerate PAP and reports  benefit from PAP use. The patient was reminded how to clean equipment and advised to replace supplies routinely. The patient was also counselled on weight loss. The compliance is excellent. The AHI is 2.3.   OSA on cpap- controlled. Continue with excellent compliance with pap. CPAP continues to be medically necessary to treat this patient's OSA. F/u one year.    2. CPAP use counseling CPAP Counseling: had a lengthy discussion with the patient regarding the importance of PAP therapy in management of the sleep apnea. Patient appears to understand the risk factor reduction and also understands the risks associated with untreated sleep apnea. Patient will try to make a good faith effort to remain compliant with therapy. Also instructed the patient on proper cleaning of the device including the water must be changed daily if possible and use of distilled water  is preferred. Patient understands that the machine  should be regularly cleaned with appropriate recommended cleaning solutions that do not damage the PAP machine for example given white vinegar and water rinses. Other methods such as ozone treatment may not be as good as these simple methods to achieve cleaning.   3. HTN (hypertension), benign Controlled  with amlodipine and carvedilol. Continue.     General Counseling: I have discussed the findings of the evaluation and examination with Levan.  I have also discussed any further diagnostic evaluation thatmay be needed or ordered today. Earvin verbalizes understanding of the findings of todays visit. We also reviewed his medications today and discussed drug interactions and side effects including but not limited excessive drowsiness and altered mental states. We also discussed that there is always a risk not just to him but also people around him. he has been encouraged to call the office with any questions or concerns that should arise related to todays visit.  No orders of the defined types were placed in this encounter.       I have personally obtained a history, examined the patient, evaluated laboratory and imaging results, formulated the assessment and plan and placed orders. This patient was seen today by Emmaline Kluver, PA-C in collaboration with Dr. Freda Munro.   Yevonne Pax, MD [facility] Diplomate ABMS Pulmonary Critical Care Medicine and Sleep Medicine

## 2023-04-28 ENCOUNTER — Ambulatory Visit (INDEPENDENT_AMBULATORY_CARE_PROVIDER_SITE_OTHER): Admitting: Internal Medicine

## 2023-04-28 VITALS — BP 132/77 | HR 56 | Resp 16 | Ht 69.0 in | Wt 219.0 lb

## 2023-04-28 DIAGNOSIS — Z7189 Other specified counseling: Secondary | ICD-10-CM | POA: Diagnosis not present

## 2023-04-28 DIAGNOSIS — G4733 Obstructive sleep apnea (adult) (pediatric): Secondary | ICD-10-CM

## 2023-04-28 DIAGNOSIS — I10 Essential (primary) hypertension: Secondary | ICD-10-CM | POA: Diagnosis not present

## 2023-04-28 NOTE — Patient Instructions (Signed)

## 2023-04-29 ENCOUNTER — Other Ambulatory Visit: Payer: Medicare Other

## 2023-04-29 DIAGNOSIS — C61 Malignant neoplasm of prostate: Secondary | ICD-10-CM

## 2023-04-30 LAB — PSA: Prostate Specific Ag, Serum: <0.1 ng/mL (ref 0.0–4.0)

## 2023-05-01 ENCOUNTER — Ambulatory Visit (INDEPENDENT_AMBULATORY_CARE_PROVIDER_SITE_OTHER): Payer: Medicare Other | Admitting: Family Medicine

## 2023-05-01 ENCOUNTER — Encounter: Payer: Self-pay | Admitting: Family Medicine

## 2023-05-01 VITALS — BP 114/72 | HR 57 | Temp 98.3°F | Resp 16 | Ht 69.02 in | Wt 216.4 lb

## 2023-05-01 DIAGNOSIS — N138 Other obstructive and reflux uropathy: Secondary | ICD-10-CM

## 2023-05-01 DIAGNOSIS — I10 Essential (primary) hypertension: Secondary | ICD-10-CM

## 2023-05-01 DIAGNOSIS — J42 Unspecified chronic bronchitis: Secondary | ICD-10-CM

## 2023-05-01 DIAGNOSIS — N401 Enlarged prostate with lower urinary tract symptoms: Secondary | ICD-10-CM | POA: Diagnosis not present

## 2023-05-01 DIAGNOSIS — D508 Other iron deficiency anemias: Secondary | ICD-10-CM

## 2023-05-01 DIAGNOSIS — C61 Malignant neoplasm of prostate: Secondary | ICD-10-CM

## 2023-05-01 DIAGNOSIS — K922 Gastrointestinal hemorrhage, unspecified: Secondary | ICD-10-CM

## 2023-05-01 DIAGNOSIS — E785 Hyperlipidemia, unspecified: Secondary | ICD-10-CM | POA: Diagnosis not present

## 2023-05-01 DIAGNOSIS — F419 Anxiety disorder, unspecified: Secondary | ICD-10-CM | POA: Diagnosis not present

## 2023-05-01 MED ORDER — PANTOPRAZOLE SODIUM 40 MG PO TBEC: 40.0000 mg | DELAYED_RELEASE_TABLET | Freq: Every day | ORAL | 1 refills | Status: DC

## 2023-05-01 MED ORDER — LORAZEPAM 0.5 MG PO TABS: 0.5000 mg | ORAL_TABLET | Freq: Every day | ORAL | 0 refills | Status: DC | PRN

## 2023-05-01 MED ORDER — CARVEDILOL 25 MG PO TABS
25.0000 mg | ORAL_TABLET | Freq: Two times a day (BID) | ORAL | 1 refills | Status: DC
Start: 2023-05-01 — End: 2023-11-19

## 2023-05-01 MED ORDER — AMLODIPINE BESYLATE 10 MG PO TABS: 10.0000 mg | ORAL_TABLET | Freq: Every day | ORAL | 1 refills | Status: DC

## 2023-05-01 MED ORDER — HYDROCHLOROTHIAZIDE 25 MG PO TABS: ORAL_TABLET | ORAL | 1 refills | Status: DC

## 2023-05-01 MED ORDER — SERTRALINE HCL 50 MG PO TABS: 50.0000 mg | ORAL_TABLET | Freq: Every day | ORAL | 1 refills | Status: DC

## 2023-05-01 MED ORDER — LOSARTAN POTASSIUM 100 MG PO TABS: ORAL_TABLET | ORAL | 1 refills | Status: DC

## 2023-05-01 NOTE — Assessment & Plan Note (Signed)
 Rechecking labs today. Await results. Treat as needed.

## 2023-05-01 NOTE — Assessment & Plan Note (Signed)
Stable. Continue to follow with urology. Call with any concerns.  

## 2023-05-01 NOTE — Assessment & Plan Note (Signed)
 Under good control on current regimen. Continue current regimen. Continue to monitor. Call with any concerns. Refills given.

## 2023-05-01 NOTE — Assessment & Plan Note (Signed)
 Under good control on current regimen. Continue current regimen. Continue to monitor. Call with any concerns. Refills given. Labs drawn today.

## 2023-05-01 NOTE — Progress Notes (Signed)
 BP 114/72 (BP Location: Left Arm, Patient Position: Sitting, Cuff Size: Large)   Pulse (!) 57   Temp 98.3 F (36.8 C) (Oral)   Resp 16   Ht 5' 9.02" (1.753 m)   Wt 216 lb 6.4 oz (98.2 kg)   SpO2 98%   BMI 31.94 kg/m    Subjective:    Patient ID: Jonathan Burns, male    DOB: [birth year], 78 y.o.   MRN: [id number]  HPI: NAOL ONTIVEROS is a 78 y.o. male  Chief Complaint  Patient presents with   Depression    Seems to be doing fine.    COPD    No breathing troubles recently    Anemia   Anxiety    On meds and seems to be doing fine.    Hyperlipidemia   GI Bleeding   Prostate Cancer    Results are less than 0.1 from Tuesday's appointment.    HYPERTENSION / HYPERLIPIDEMIA Satisfied with current treatment? yes Duration of hypertension: chronic BP monitoring frequency: not checking BP medication side effects: no Past BP meds: losartan, hydrochlorothiazide, carvedilol, amlodipine Duration of hyperlipidemia: chronic Cholesterol medication side effects: no Cholesterol supplements: fish oil Past cholesterol medications: none Medication compliance: excellent compliance Aspirin: no Recent stressors: no Recurrent headaches: no Visual changes: no Palpitations: no Dyspnea: no Chest pain: no Lower extremity edema: no Dizzy/lightheaded: no  COPD COPD status: controlled Satisfied with current treatment?: yes Oxygen use: no Dyspnea frequency: rarely Cough frequency: rarely Rescue inhaler frequency: never   Limitation of activity: no Productive cough: none Pneumovax: Up to Date Influenza: Up to Date  ANXIETY/STRESS Duration: chronic Status:controlled Anxious mood: yes  Excessive worrying: no Irritability: no  Sweating: no Nausea: no Palpitations:no Hyperventilation: no Panic attacks: no Agoraphobia: no  Obscessions/compulsions: no Depressed mood: no    05/01/2023    8:06 AM 11/01/2022   10:52 AM 06/17/2022   11:05 AM 04/22/2022    8:04 AM 12/19/2021    9:28 AM   Depression screen PHQ 2/9  Decreased Interest 0 0 0 0 0  Down, Depressed, Hopeless 0 0 0 0 0  PHQ - 2 Score 0 0 0 0 0  Altered sleeping 0 0 0 0 0  Tired, decreased energy 0 1 0 0 0  Change in appetite 0 0 0 0 0  Feeling bad or failure about yourself  0 0 0 0 0  Trouble concentrating 0 0 0 0 0  Moving slowly or fidgety/restless 0 0 0 0 0  Suicidal thoughts 0 0 0 0 0  PHQ-9 Score 0 1 0 0 0  Difficult doing work/chores  Not difficult at all Not difficult at all Not difficult at all    Anhedonia: no Weight changes: no Insomnia: no   Hypersomnia: no Fatigue/loss of energy: no Feelings of worthlessness: no Feelings of guilt: no Impaired concentration/indecisiveness: no Suicidal ideations: no  Crying spells: no Recent Stressors/Life Changes: no   Relationship problems: no   Family stress: no     Financial stress: no    Job stress: no    Recent death/loss: no  ANEMIA Anemia status: controlled Etiology of anemia:iron deficiency Duration of anemia treatment: chronic Compliance with treatment: excellent compliance Iron supplementation side effects: no Severity of anemia: mild Fatigue: yes Decreased exercise tolerance: no  Dyspnea on exertion: no Palpitations: no Bleeding: no Pica: no   Relevant past medical, surgical, family and social history reviewed and updated as indicated. Interim medical history since our last visit reviewed.  Allergies and medications reviewed and updated.  Review of Systems  Constitutional: Negative.   Respiratory: Negative.    Cardiovascular: Negative.   Gastrointestinal: Negative.   Musculoskeletal: Negative.   Neurological: Negative.   Psychiatric/Behavioral: Negative.      Per HPI unless specifically indicated above     Objective:    BP 114/72 (BP Location: Left Arm, Patient Position: Sitting, Cuff Size: Large)   Pulse (!) 57   Temp 98.3 F (36.8 C) (Oral)   Resp 16   Ht 5' 9.02" (1.753 m)   Wt 216 lb 6.4 oz (98.2 kg)   SpO2  98%   BMI 31.94 kg/m   Wt Readings from Last 3 Encounters:  05/01/23 216 lb 6.4 oz (98.2 kg)  04/28/23 219 lb (99.3 kg)  11/01/22 212 lb 12.8 oz (96.5 kg)    Physical Exam Vitals and nursing note reviewed.  Constitutional:      General: He is not in acute distress.    Appearance: Normal appearance. He is not ill-appearing, toxic-appearing or diaphoretic.  HENT:     Head: Normocephalic and atraumatic.     Right Ear: External ear normal.     Left Ear: External ear normal.     Nose: Nose normal.     Mouth/Throat:     Mouth: Mucous membranes are moist.     Pharynx: Oropharynx is clear.  Eyes:     General: No scleral icterus.       Right eye: No discharge.        Left eye: No discharge.     Extraocular Movements: Extraocular movements intact.     Conjunctiva/sclera: Conjunctivae normal.     Pupils: Pupils are equal, round, and reactive to light.  Cardiovascular:     Rate and Rhythm: Normal rate and regular rhythm.     Pulses: Normal pulses.     Heart sounds: Normal heart sounds. No murmur heard.    No friction rub. No gallop.  Pulmonary:     Effort: Pulmonary effort is normal. No respiratory distress.     Breath sounds: Normal breath sounds. No stridor. No wheezing, rhonchi or rales.  Chest:     Chest wall: No tenderness.  Musculoskeletal:        General: Normal range of motion.     Cervical back: Normal range of motion and neck supple.  Skin:    General: Skin is warm and dry.     Capillary Refill: Capillary refill takes less than 2 seconds.     Coloration: Skin is not jaundiced or pale.     Findings: No bruising, erythema, lesion or rash.  Neurological:     General: No focal deficit present.     Mental Status: He is alert and oriented to person, place, and time. Mental status is at baseline.  Psychiatric:        Mood and Affect: Mood normal.        Behavior: Behavior normal.        Thought Content: Thought content normal.        Judgment: Judgment normal.      Results for orders placed or performed in visit on 04/29/23  PSA   Collection Time: 04/29/23  8:11 AM  Result Value Ref Range   Prostate Specific Ag, Serum <0.1 0.0 - 4.0 ng/mL      Assessment & Plan:   Problem List Items Addressed This Visit       Cardiovascular and Mediastinum   HTN (hypertension), benign  Under good control on current regimen. Continue current regimen. Continue to monitor. Call with any concerns. Refills given. Labs drawn today.       Relevant Medications   losartan (COZAAR) 100 MG tablet   carvedilol (COREG) 25 MG tablet   hydrochlorothiazide (HYDRODIURIL) 25 MG tablet   amLODipine (NORVASC) 10 MG tablet   Other Relevant Orders   CBC with Differential/Platelet     Respiratory   COPD (chronic obstructive pulmonary disease) (HCC)   Under good control on current regimen. Continue current regimen. Continue to monitor. Call with any concerns. Refills given. Labs drawn today.       Relevant Orders   CBC with Differential/Platelet     Digestive   Chronic GI bleeding   Rechecking labs today. Await results. Treat as needed.       Relevant Medications   pantoprazole (PROTONIX) 40 MG tablet   Other Relevant Orders   CBC with Differential/Platelet   Comprehensive metabolic panel     Genitourinary   BPH with obstruction/lower urinary tract symptoms   Under good control on current regimen. Continue current regimen. Continue to monitor. Call with any concerns. Refills given. Continue to follow with urology.        Relevant Orders   CBC with Differential/Platelet   Prostate cancer (HCC)   Stable. Continue to follow with urology. Call with any concerns.       Relevant Medications   LORazepam (ATIVAN) 0.5 MG tablet   Other Relevant Orders   CBC with Differential/Platelet     Other   Hyperlipidemia - Primary   Under good control on current regimen. Continue current regimen. Continue to monitor. Call with any concerns. Refills given. Labs drawn  today.        Relevant Medications   losartan (COZAAR) 100 MG tablet   carvedilol (COREG) 25 MG tablet   hydrochlorothiazide (HYDRODIURIL) 25 MG tablet   amLODipine (NORVASC) 10 MG tablet   Other Relevant Orders   CBC with Differential/Platelet   Lipid Panel w/o Chol/HDL Ratio   Acute anxiety   Under good control on current regimen. Continue current regimen. Continue to monitor. Call with any concerns. Refills given.        Relevant Medications   LORazepam (ATIVAN) 0.5 MG tablet   sertraline (ZOLOFT) 50 MG tablet   Iron (Fe) deficiency anemia   Rechecking labs today. Await results. Treat as needed.       Relevant Orders   CBC with Differential/Platelet   Ferritin   Iron Binding Cap (TIBC)([facility])     Follow up plan: Return in about 6 months (around 11/01/2023) for physical.

## 2023-05-01 NOTE — Assessment & Plan Note (Signed)
Under good control on current regimen. Continue current regimen. Continue to monitor. Call with any concerns. Refills given. Continue to follow with urology.

## 2023-05-02 LAB — CBC WITH DIFFERENTIAL/PLATELET
Basophils Absolute: 0 10*3/uL (ref 0.0–0.2)
Basos: 1 %
EOS (ABSOLUTE): 0.3 10*3/uL (ref 0.0–0.4)
Eos: 6 %
Hematocrit: 44.3 % (ref 37.5–51.0)
Hemoglobin: 14.3 g/dL (ref 13.0–17.7)
Immature Grans (Abs): 0 10*3/uL (ref 0.0–0.1)
Immature Granulocytes: 0 %
Lymphocytes Absolute: 0.6 10*3/uL — ABNORMAL LOW (ref 0.7–3.1)
Lymphs: 13 %
MCH: 31.6 pg (ref 26.6–33.0)
MCHC: 32.3 g/dL (ref 31.5–35.7)
MCV: 98 fL — ABNORMAL HIGH (ref 79–97)
Monocytes Absolute: 0.6 10*3/uL (ref 0.1–0.9)
Monocytes: 14 %
Neutrophils Absolute: 3.2 10*3/uL (ref 1.4–7.0)
Neutrophils: 66 %
Platelets: 194 10*3/uL (ref 150–450)
RBC: 4.52 x10E6/uL (ref 4.14–5.80)
RDW: 13.2 % (ref 11.6–15.4)
WBC: 4.7 10*3/uL (ref 3.4–10.8)

## 2023-05-02 LAB — COMPREHENSIVE METABOLIC PANEL
ALT: 16 IU/L (ref 0–44)
AST: 20 IU/L (ref 0–40)
Albumin: 4.4 g/dL (ref 3.8–4.8)
Alkaline Phosphatase: 79 IU/L (ref 44–121)
BUN/Creatinine Ratio: 18 (ref 10–24)
BUN: 16 mg/dL (ref 8–27)
Bilirubin Total: 0.4 mg/dL (ref 0.0–1.2)
CO2: 24 mmol/L (ref 20–29)
Calcium: 9.7 mg/dL (ref 8.6–10.2)
Chloride: 99 mmol/L (ref 96–106)
Creatinine, Ser: 0.88 mg/dL (ref 0.76–1.27)
Globulin, Total: 2.5 g/dL (ref 1.5–4.5)
Glucose: 98 mg/dL (ref 70–99)
Potassium: 4.3 mmol/L (ref 3.5–5.2)
Sodium: 139 mmol/L (ref 134–144)
Total Protein: 6.9 g/dL (ref 6.0–8.5)
eGFR: 87 mL/min/{1.73_m2} (ref >59)

## 2023-05-02 LAB — FERRITIN: Ferritin: 90 ng/mL (ref 30–400)

## 2023-05-02 LAB — IRON AND TIBC
Iron Saturation: 25 % (ref 15–55)
Iron: 80 ug/dL (ref 38–169)
Total Iron Binding Capacity: 321 ug/dL (ref 250–450)
UIBC: 241 ug/dL (ref 111–343)

## 2023-05-02 LAB — LIPID PANEL W/O CHOL/HDL RATIO
Cholesterol, Total: 185 mg/dL (ref 100–199)
HDL: 59 mg/dL (ref >39)
LDL Chol Calc (NIH): 112 mg/dL — ABNORMAL HIGH (ref 0–99)
Triglycerides: 75 mg/dL (ref 0–149)
VLDL Cholesterol Cal: 14 mg/dL (ref 5–40)

## 2023-05-05 NOTE — Progress Notes (Unsigned)
 05/06/2018 8:32 AM   Jonny Ruiz Narda Amber [birth year] [id number]  Referring provider: Dorcas Carrow, DO [address] [place],  [place] [address]  Urological history 1.  Prostate cancer -PSA (04/2023) <0.1 -high risk prostate cancer -underwent a biopsy for an elevated PSA of 6.0, and a right-sided prostate nodule. TRUS volume 33 cc.  Prostate biopsy revealed 12 of 12 cores positive including lesions up to Gleason 4+4 involving 80% of the tissue on the right lateral mid core 03/2017 -completed 07/2017 IM RT radiation therapy to both his prostate and pelvic nodes -taking calcium and Vitamin D  2. BPH with LU TS -cysto (2021) - mild lateral lobe enlargement prostate, mild hypervascularity prostate -tamsulosin 0.4 mg prn  3.  High risk hematuria - former smoker - CT with contrast (12/2018) -no worrisome GU findings  -Cysto (03/2019) - hypervascular prostate -CT urogram (10/2021) - There is a tiny 2 mm stone within the right UVJ without associated hydroureter or ureteral lithiasis.  Bilateral punctate nonobstructing renal calculi.  3.  Nephrolithiasis -CT urogram (10/2021) - There is a tiny 2 mm stone within the right UVJ without associated hydroureter or ureteral lithiasis.  Bilateral punctate nonobstructing renal calculi -Spontaneous passage  Chief Complaint  Patient presents with   Follow-up    6 month follow up   HPI: FABIEN TRAVELSTEAD is a 78 y.o. male who presents today for follow up.   Previous records reviewed.     I PSS 2/1  He has no urinary complaints.  Patient denies any modifying or aggravating factors.  Patient denies any recent UTI's, gross hematuria, dysuria or suprapubic/flank pain.  Patient denies any fevers, chills, nausea or vomiting.    He is taking tamsulosin 0.4 mg daily.  It is filled by his primary care provider.  He still has issues with erections, but he does not desire treatment for it at this time.   IPSS     Row Name 05/07/23 0800         International  Prostate Symptom Score   How often have you had the sensation of not emptying your bladder? Not at All     How often have you had to urinate less than every two hours? Not at All     How often have you found you stopped and started again several times when you urinated? Not at All     How often have you found it difficult to postpone urination? Less than 1 in 5 times     How often have you had a weak urinary stream? Not at All     How often have you had to strain to start urination? Not at All     How many times did you typically get up at night to urinate? 1 Time     Total IPSS Score 2       Quality of Life due to urinary symptoms   If you were to spend the rest of your life with your urinary condition just the way it is now how would you feel about that? Pleased               Score:  1-7 Mild 8-19 Moderate 20-35 Severe    PMH: Past Medical History:  Diagnosis Date   Anemia    BPH (benign prostatic hypertrophy)    Cancer (HCC)    Chronic GI bleeding    COPD (chronic obstructive pulmonary disease) (HCC)    Hyperlipidemia    Hypertension  Prostate nodule    PUD (peptic ulcer disease)    Rising PSA level     Surgical History: Past Surgical History:  Procedure Laterality Date   COLONOSCOPY  2015   ESOPHAGOGASTRODUODENOSCOPY  2015   FLEXIBLE BRONCHOSCOPY Left 01/20/2019   Procedure: FLEXIBLE BRONCHOSCOPY;  Surgeon: Hulda Marin, MD;  Location: [facility];  Service: Thoracic;  Laterality: Left;   THORACOTOMY Left 01/20/2019   Procedure: CONVERTED TO THORACOTOMY MAJOR;  Surgeon: Hulda Marin, MD;  Location: [facility];  Service: Thoracic;  Laterality: Left;   VIDEO ASSISTED THORACOSCOPY (VATS)/THOROCOTOMY Left 01/20/2019   Procedure: VIDEO ASSISTED THORACOSCOPY (VATS)/THOROCOTOMY;  Surgeon: Hulda Marin, MD;  Location: [facility];  Service: Thoracic;  Laterality: Left;    Home Medications:  Current Outpatient Medications on File Prior to Visit  Medication Sig Dispense  Refill   amLODipine (NORVASC) 10 MG tablet Take 1 tablet (10 mg total) by mouth daily. 90 tablet 1   carvedilol (COREG) 25 MG tablet Take 1 tablet (25 mg total) by mouth 2 (two) times daily with a meal. 180 tablet 1   hydrochlorothiazide (HYDRODIURIL) 25 MG tablet 1 tab daily 90 tablet 1   LORazepam (ATIVAN) 0.5 MG tablet Take 1 tablet (0.5 mg total) by mouth daily as needed for anxiety. 30 tablet 0   losartan (COZAAR) 100 MG tablet Take 1 tablet (100 mg total) by mouth daily. 90 tablet 1   Multiple Vitamin (MULTIVITAMIN) tablet Take 1 tablet by mouth daily.     Multiple Vitamins-Minerals (PRESERVISION AREDS PO) Take 1 tablet by mouth 2 (two) times daily.     Omega-3 1000 MG CAPS Take 1 capsule by mouth daily.     pantoprazole (PROTONIX) 40 MG tablet Take 1 tablet (40 mg total) by mouth daily. 90 tablet 1   sertraline (ZOLOFT) 50 MG tablet Take 1 tablet (50 mg total) by mouth daily. 90 tablet 1   tamsulosin (FLOMAX) 0.4 MG CAPS capsule Take 1 capsule (0.4 mg total) by mouth daily. 90 capsule 3   valACYclovir (VALTREX) 500 MG tablet Take 500 mg by mouth daily.     No current facility-administered medications on file prior to visit.      Allergies: No Known Allergies  Family History: Family History  Problem Relation Age of Onset   Cancer Mother        breast   Diabetes Mother    Aneurysm Father    Kidney disease Neg Hx    Prostate cancer Neg Hx    Kidney cancer Neg Hx     Social History:  reports that he quit smoking about 36 years ago. His smoking use included cigarettes. He started smoking about 66 years ago. He has a 30 pack-year smoking history. He has never used smokeless tobacco. He reports current alcohol use of about 2.0 standard drinks of alcohol per week. He reports that he does not use drugs.  ROS: For pertinent review of systems please refer to history of present illness  Physical Exam: BP 117/71   Pulse (!) 58   Constitutional:  Well nourished. Alert and oriented,  No acute distress. HEENT: [place] AT, moist mucus membranes.  Trachea midline Cardiovascular: No clubbing, cyanosis, or edema. Respiratory: Normal respiratory effort, no increased work of breathing. Neurologic: Grossly intact, no focal deficits, moving all 4 extremities. Psychiatric: Normal mood and affect.   Laboratory Data: Results for orders placed or performed in visit on 05/01/23  CBC with Differential/Platelet   Collection Time: 05/01/23  8:28 AM  Result Value  Ref Range   WBC 4.7 3.4 - 10.8 x10E3/uL   RBC 4.52 4.14 - 5.80 x10E6/uL   Hemoglobin 14.3 13.0 - 17.7 g/dL   Hematocrit 16.1 09.6 - 51.0 %   MCV 98 (H) 79 - 97 fL   MCH 31.6 26.6 - 33.0 pg   MCHC 32.3 31.5 - 35.7 g/dL   RDW 04.5 40.9 - 81.1 %   Platelets 194 150 - 450 x10E3/uL   Neutrophils 66 Not Estab. %   Lymphs 13 Not Estab. %   Monocytes 14 Not Estab. %   Eos 6 Not Estab. %   Basos 1 Not Estab. %   Neutrophils Absolute 3.2 1.4 - 7.0 x10E3/uL   Lymphocytes Absolute 0.6 (L) 0.7 - 3.1 x10E3/uL   Monocytes Absolute 0.6 0.1 - 0.9 x10E3/uL   EOS (ABSOLUTE) 0.3 0.0 - 0.4 x10E3/uL   Basophils Absolute 0.0 0.0 - 0.2 x10E3/uL   Immature Granulocytes 0 Not Estab. %   Immature Grans (Abs) 0.0 0.0 - 0.1 x10E3/uL  Comprehensive metabolic panel   Collection Time: 05/01/23  8:28 AM  Result Value Ref Range   Glucose 98 70 - 99 mg/dL   BUN 16 8 - 27 mg/dL   Creatinine, Ser 9.14 0.76 - 1.27 mg/dL   eGFR 87 >78 GN/FAO/1.30   BUN/Creatinine Ratio 18 10 - 24   Sodium 139 134 - 144 mmol/L   Potassium 4.3 3.5 - 5.2 mmol/L   Chloride 99 96 - 106 mmol/L   CO2 24 20 - 29 mmol/L   Calcium 9.7 8.6 - 10.2 mg/dL   Total Protein 6.9 6.0 - 8.5 g/dL   Albumin 4.4 3.8 - 4.8 g/dL   Globulin, Total 2.5 1.5 - 4.5 g/dL   Bilirubin Total 0.4 0.0 - 1.2 mg/dL   Alkaline Phosphatase 79 44 - 121 IU/L   AST 20 0 - 40 IU/L   ALT 16 0 - 44 IU/L  Lipid Panel w/o Chol/HDL Ratio   Collection Time: 05/01/23  8:28 AM  Result Value Ref Range    Cholesterol, Total 185 100 - 199 mg/dL   Triglycerides 75 0 - 149 mg/dL   HDL 59 >86 mg/dL   VLDL Cholesterol Cal 14 5 - 40 mg/dL   LDL Chol Calc (NIH) 578 (H) 0 - 99 mg/dL  Ferritin   Collection Time: 05/01/23  8:28 AM  Result Value Ref Range   Ferritin 90 30 - 400 ng/mL  Iron Binding Cap (TIBC)([facility])   Collection Time: 05/01/23  8:28 AM  Result Value Ref Range   Total Iron Binding Capacity 321 250 - 450 ug/dL   UIBC 469 629 - 528 ug/dL   Iron 80 38 - 413 ug/dL   Iron Saturation 25 15 - 55 %  I have reviewed the labs.  Pertinent Imaging N/A  Assessment & Plan:     1. Prostate cancer -PSA remains undetectable -will move to annual screening  2. High risk hematuria -former smoker -Hematuria work up completed in 2021 - findings positive for hypervascular prostate and nephrolithiasis -No report of gross hematuria  -RTC in one year for UA - patient to report any gross hematuria in the interim     3.  Nephrolithiasis -Bilateral punctate stones on CT urogram completed in August 2023 -No issues with renal colic or passage of stone or gross hematuria -Continue to follow clinically  4. BPH with LUTS -PSA undetectable -continue conservative management, avoiding bladder irritants and timed voiding's -Continue tamsulosin 0.4 mg daily  Return in about 1 year (around 05/06/2024) for PSA, I PSS, UA .  Cloretta Ned  [facility] [address] [place], [place] [address] [phone]

## 2023-05-07 ENCOUNTER — Ambulatory Visit (INDEPENDENT_AMBULATORY_CARE_PROVIDER_SITE_OTHER): Payer: Medicare Other | Admitting: Urology

## 2023-05-07 ENCOUNTER — Other Ambulatory Visit: Payer: Self-pay

## 2023-05-07 ENCOUNTER — Encounter: Payer: Self-pay | Admitting: Urology

## 2023-05-07 VITALS — BP 117/71 | HR 58

## 2023-05-07 DIAGNOSIS — N200 Calculus of kidney: Secondary | ICD-10-CM

## 2023-05-07 DIAGNOSIS — N401 Enlarged prostate with lower urinary tract symptoms: Secondary | ICD-10-CM

## 2023-05-07 DIAGNOSIS — N138 Other obstructive and reflux uropathy: Secondary | ICD-10-CM | POA: Diagnosis not present

## 2023-05-07 DIAGNOSIS — C61 Malignant neoplasm of prostate: Secondary | ICD-10-CM

## 2023-05-07 DIAGNOSIS — R319 Hematuria, unspecified: Secondary | ICD-10-CM | POA: Diagnosis not present

## 2023-05-09 ENCOUNTER — Encounter: Payer: Self-pay | Admitting: Family Medicine

## 2023-06-10 DIAGNOSIS — H43813 Vitreous degeneration, bilateral: Secondary | ICD-10-CM | POA: Diagnosis not present

## 2023-06-10 DIAGNOSIS — H2513 Age-related nuclear cataract, bilateral: Secondary | ICD-10-CM | POA: Diagnosis not present

## 2023-06-10 DIAGNOSIS — H353131 Nonexudative age-related macular degeneration, bilateral, early dry stage: Secondary | ICD-10-CM | POA: Diagnosis not present

## 2023-06-10 DIAGNOSIS — B0050 Herpesviral ocular disease, unspecified: Secondary | ICD-10-CM | POA: Diagnosis not present

## 2023-06-10 DIAGNOSIS — H33312 Horseshoe tear of retina without detachment, left eye: Secondary | ICD-10-CM | POA: Diagnosis not present

## 2023-06-24 DIAGNOSIS — H43813 Vitreous degeneration, bilateral: Secondary | ICD-10-CM | POA: Diagnosis not present

## 2023-06-24 DIAGNOSIS — H353131 Nonexudative age-related macular degeneration, bilateral, early dry stage: Secondary | ICD-10-CM | POA: Diagnosis not present

## 2023-06-24 DIAGNOSIS — H33332 Multiple defects of retina without detachment, left eye: Secondary | ICD-10-CM | POA: Diagnosis not present

## 2023-06-26 ENCOUNTER — Ambulatory Visit: Payer: Self-pay

## 2023-06-26 VITALS — Ht 70.0 in | Wt 210.0 lb

## 2023-06-26 DIAGNOSIS — Z0000 Encounter for general adult medical examination without abnormal findings: Secondary | ICD-10-CM | POA: Diagnosis not present

## 2023-06-26 NOTE — Patient Instructions (Addendum)
 Jonathan Burns , Thank you for taking time to come for your Medicare Wellness Visit. I appreciate your ongoing commitment to your health goals. Please review the following plan we discussed and let me know if I can assist you in the future.   Referrals/Orders/Follow-Ups/Clinician Recommendations: Keep up the good work!  This is a list of the screening recommended for you and due dates:  Health Maintenance  Topic Date Due   Flu Shot  09/19/2023   Medicare Annual Wellness Visit  06/25/2024   DTaP/Tdap/Td vaccine (3 - Td or Tdap) 09/19/2025   Pneumonia Vaccine  Completed   Hepatitis C Screening  Completed   Zoster (Shingles) Vaccine  Completed   HPV Vaccine  Aged Out   Meningitis B Vaccine  Aged Out   Colon Cancer Screening  Discontinued   COVID-19 Vaccine  Discontinued    Advanced directives: (Copy Requested) Please bring a copy of your health care power of attorney and living will to the office to be added to your chart at your convenience. You can mail to [facility] [address] [place], [place] [address] or email to [email or web address]  Next Medicare Annual Wellness Visit scheduled for next year: Yes, 07/08/24 @ 10:00am (phone visit)  Have you seen your provider in the last 6 months (3 months if uncontrolled diabetes)? Yes, next OV on 11/19/23

## 2023-06-26 NOTE — Progress Notes (Signed)
 Subjective:   Jonathan Burns is a 78 y.o. who presents for a Medicare Wellness preventive visit.  Visit Complete: Virtual I connected with  Ladean Picket on 06/26/23 by a audio enabled telemedicine application and verified that I am speaking with the correct person using two identifiers.  Patient Location: Home  Provider Location: Home Office  I discussed the limitations of evaluation and management by telemedicine. The patient expressed understanding and agreed to proceed.  Vital Signs: Because this visit was a virtual/telehealth visit, some criteria may be missing or patient reported. Any vitals not documented were not able to be obtained and vitals that have been documented are patient reported.  VideoDeclined- This patient declined Librarian, academic. Therefore the visit was completed with audio only.  Persons Participating in Visit: Patient.  AWV Questionnaire: Yes: Patient Medicare AWV questionnaire was completed by the patient on 06/22/23; I have confirmed that all information answered by patient is correct and no changes since this date.  Cardiac Risk Factors include: advanced age (>45men, >3 women);male gender;hypertension;dyslipidemia;obesity (BMI >30kg/m2);Other (see comment), Risk factor comments: OSA (cpap)     Objective:    Today's Vitals   06/26/23 0956  Weight: 210 lb (95.3 kg)  Height: 5\' 10"  (1.778 m)   Body mass index is 30.13 kg/m.     06/26/2023   10:09 AM 06/17/2022   11:06 AM 06/12/2021    8:35 AM 05/29/2020    8:21 AM 09/15/2019    9:27 AM 05/24/2019    8:22 AM 01/20/2019   12:23 PM  Advanced Directives  Does Patient Have a Medical Advance Directive? Yes No Yes Yes Yes Yes No  Type of Estate agent of [place];Living will  Healthcare Power of [employer] of [place];Living will Healthcare Power of [place];Living will Living will;Healthcare Power of Attorney   Does patient want to make changes to  medical advance directive? No - Patient declined    No - Patient declined    Copy of Healthcare Power of Attorney in Chart? No - copy requested  Yes - validated most recent copy scanned in chart (See row information) No - copy requested No - copy requested Yes - validated most recent copy scanned in chart (See row information)   Would patient like information on creating a medical advance directive?  No - Patient declined     No - Patient declined    Current Medications (verified) Outpatient Encounter Medications as of 06/26/2023  Medication Sig   amLODipine  (NORVASC ) 10 MG tablet Take 1 tablet (10 mg total) by mouth daily.   carvedilol  (COREG ) 25 MG tablet Take 1 tablet (25 mg total) by mouth 2 (two) times daily with a meal.   hydrochlorothiazide  (HYDRODIURIL ) 25 MG tablet 1 tab daily   LORazepam  (ATIVAN ) 0.5 MG tablet Take 1 tablet (0.5 mg total) by mouth daily as needed for anxiety.   losartan  (COZAAR ) 100 MG tablet Take 1 tablet (100 mg total) by mouth daily.   Multiple Vitamin (MULTIVITAMIN) tablet Take 1 tablet by mouth daily.   Multiple Vitamins-Minerals (PRESERVISION AREDS PO) Take 1 tablet by mouth 2 (two) times daily.   Omega-3 1000 MG CAPS Take 1 capsule by mouth daily.   pantoprazole  (PROTONIX ) 40 MG tablet Take 1 tablet (40 mg total) by mouth daily.   sertraline  (ZOLOFT ) 50 MG tablet Take 1 tablet (50 mg total) by mouth daily.   valACYclovir (VALTREX) 500 MG tablet Take 500 mg by mouth daily.  tamsulosin  (FLOMAX ) 0.4 MG CAPS capsule Take 1 capsule (0.4 mg total) by mouth daily. (Patient not taking: Reported on 06/26/2023)   No facility-administered encounter medications on file as of 06/26/2023.    Allergies (verified) Patient has no known allergies.   History: Past Medical History:  Diagnosis Date   Anemia    BPH (benign prostatic hypertrophy)    Cancer (HCC)    Chronic GI bleeding    COPD (chronic obstructive pulmonary disease) (HCC)    Hyperlipidemia    Hypertension     Prostate nodule    PUD (peptic ulcer disease)    Rising PSA level    Past Surgical History:  Procedure Laterality Date   COLONOSCOPY  2015   ESOPHAGOGASTRODUODENOSCOPY  2015   FLEXIBLE BRONCHOSCOPY Left 01/20/2019   Procedure: FLEXIBLE BRONCHOSCOPY;  Surgeon: Petra Brandy, MD;  Location: [facility] OR[facility];  Service: Thoracic;  Laterality: Left;   THORACOTOMY Left 01/20/2019   Procedure: CONVERTED TO THORACOTOMY MAJOR;  Surgeon: Petra Brandy, MD;  Location: [facility] OR[facility];  Service: Thoracic;  Laterality: Left;   VIDEO ASSISTED THORACOSCOPY (VATS)/THOROCOTOMY Left 01/20/2019   Procedure: VIDEO ASSISTED THORACOSCOPY (VATS)/THOROCOTOMY;  Surgeon: Petra Brandy, MD;  Location: [facility] OR[facility];  Service: Thoracic;  Laterality: Left;   Family History  Problem Relation Age of Onset   Cancer Mother        breast   Diabetes Mother    Aneurysm Father    Kidney disease Neg Hx    Prostate cancer Neg Hx    Kidney cancer Neg Hx    Social History   Socioeconomic History   Marital status: Widowed    Spouse name: Not on file   Number of children: 2   Years of education: Not on file   Highest education level: Associate degree: academic program  Occupational History   Not on file  Tobacco Use   Smoking status: Former    Current packs/day: 0.00    Average packs/day: 1 pack/day for 30.0 years (30.0 ttl pk-yrs)    Types: Cigarettes    Start date: 08/16/1956    Quit date: 08/17/1986    Years since quitting: 36.8    Passive exposure: Never   Smokeless tobacco: Never  Vaping Use   Vaping status: Never Used  Substance and Sexual Activity   Alcohol use: Yes    Alcohol/week: 2.0 standard drinks of alcohol    Types: 2 Standard drinks or equivalent per week    Comment: 1 drink twice a week, mixes up between drinks   Drug use: No   Sexual activity: Not Currently  Other Topics Concern   Not on file  Social History Narrative   Owns [employer], 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   Involved in [place] activities     Social Drivers of Corporate investment banker Strain: Low Risk  (06/26/2023)   Overall Financial Resource Strain (CARDIA)    Difficulty of Paying Living Expenses: Not hard at all  Food Insecurity: No Food Insecurity (06/26/2023)   Hunger Vital Sign    Worried About Running Out of Food in the Last Year: Never true    Ran Out of Food in the Last Year: Never true  Transportation Needs: No Transportation Needs (06/26/2023)   PRAPARE - Administrator, Civil Service (Medical): No    Lack of Transportation (Non-Medical): No  Physical Activity: Insufficiently Active (06/26/2023)   Exercise Vital Sign    Days of Exercise per Week: 3 days    Minutes  of Exercise per Session: 20 min  Stress: No Stress Concern Present (06/26/2023)   [employer] of Occupational Health - Occupational Stress Questionnaire    Feeling of Stress : Not at all  Social Connections: Moderately Integrated (06/26/2023)   Social Connection and Isolation Panel [NHANES]    Frequency of Communication with Friends and Family: More than three times a week    Frequency of Social Gatherings with Friends and Family: More than three times a week    Attends Religious Services: More than 4 times per year    Active Member of [employer] or Organizations: Yes    Attends Banker Meetings: More than 4 times per year    Marital Status: Widowed    Tobacco Counseling Counseling given: Not Answered    Clinical Intake:  Pre-visit preparation completed: Yes  Pain : No/denies pain     BMI - recorded: 30.13 Nutritional Status: BMI > 30  Obese Nutritional Risks: None Diabetes: No  No results found for: "HGBA1C"   How often do you need to have someone help you when you read instructions, pamphlets, or other written materials from your doctor or pharmacy?: 1 - Never  Interpreter Needed?: No  Information entered by :: Jaunita Messier, CMA   Activities of Daily Living     06/26/2023    9:58 AM 06/22/2023    4:07 PM   In your present state of health, do you have any difficulty performing the following activities:  Hearing? 0 0  Vision? 0 0  Difficulty concentrating or making decisions? 0 0  Walking or climbing stairs? 0 0  Dressing or bathing? 0 0  Doing errands, shopping? 0 0  Preparing Food and eating ? N N  Using the Toilet? N N  In the past six months, have you accidently leaked urine? N N  Do you have problems with loss of bowel control? N N  Managing your Medications? N N  Managing your Finances? N N  Housekeeping or managing your Housekeeping? N N    Patient Care Team: Solomon Dupre, DO as PCP - General (Family Medicine) Dustin Gimenez, MD as Consulting Physician (Urology) Cleta Daisy as Physician Assistant (Urology) Reche Canales, [place] (Optometry) Mariane Shire, MD as Referring Physician (Dermatology) Cordie Deters, MD as Consulting Physician (Pulmonary Disease)  Indicate any recent Medical Services you may have received from other than [facility] providers in the past year (date may be approximate).     Assessment:    This is a routine wellness examination for Eleftherios.  Hearing/Vision screen Hearing Screening - Comments:: Denies hearing loss Vision Screening - Comments:: Gets routine eye exams, Dr. Barrie Borer, [place]   Goals Addressed               This Visit's Progress     Weight (lb) < 200 lb (90.7 kg) (pt-stated)   210 lb (95.3 kg)      Depression Screen     06/26/2023   10:08 AM 05/01/2023    8:06 AM 11/01/2022   10:52 AM 06/17/2022   11:05 AM 04/22/2022    8:04 AM 12/19/2021    9:28 AM 11/15/2021    8:26 AM  PHQ 2/9 Scores  PHQ - 2 Score 0 0 0 0 0 0 1  PHQ- 9 Score 1 0 1 0 0 0 1    Fall Risk     06/26/2023   10:10 AM 06/22/2023    4:07 PM 05/01/2023  8:06 AM 11/01/2022   10:52 AM 06/17/2022   11:06 AM  Fall Risk   Falls in the past year? 0 0 0 0 0  Number falls in past yr: 0  0 0 0  Injury with Fall? 0  0 0 0  Risk for fall due to : No  Fall Risks  No Fall Risks No Fall Risks No Fall Risks  Follow up Falls prevention discussed;Falls evaluation completed  Falls evaluation completed Falls evaluation completed Falls prevention discussed;Falls evaluation completed    MEDICARE RISK AT HOME:  Medicare Risk at Home Any stairs in or around the home?: Yes If so, are there any without handrails?: No Home free of loose throw rugs in walkways, pet beds, electrical cords, etc?: Yes Adequate lighting in your home to reduce risk of falls?: Yes Life alert?: No Use of a cane, walker or w/c?: No Grab bars in the bathroom?: Yes Shower chair or bench in shower?: Yes Elevated toilet seat or a handicapped toilet?: No  TIMED UP AND GO:  Was the test performed?  No  Cognitive Function: 6CIT completed        06/26/2023   10:12 AM 06/17/2022   11:09 AM 06/12/2021    8:34 AM 05/29/2020    8:22 AM 05/24/2019    8:23 AM  6CIT Screen  What Year? 0 points 0 points 0 points 0 points 0 points  What month? 0 points 0 points 0 points 0 points 0 points  What time? 0 points 0 points 0 points 0 points 0 points  Count back from 20 0 points 0 points 0 points 0 points 0 points  Months in reverse 0 points 0 points 0 points 2 points 0 points  Repeat phrase 0 points 0 points 0 points 0 points 0 points  Total Score 0 points 0 points 0 points 2 points 0 points    Immunizations Immunization History  Administered Date(s) Administered   Fluad Quad(high Dose 65+) 12/06/2020, 11/15/2021   Fluad Trivalent(High Dose 65+) 11/01/2022   Influenza,inj,Quad PF,6+ Mos 11/22/2016   Influenza-Unspecified 02/15/2014, 11/29/2014, 12/11/2015, 11/26/2017, 10/20/2018   PFIZER(Purple Top)SARS-COV-2 Vaccination 03/18/2019, 04/08/2019, 10/13/2019   Pneumococcal Conjugate-13 02/15/2014   Pneumococcal Polysaccharide-23 12/19/2005, 09/20/2015   Td 11/20/2004   Td (Adult), 2 Lf Tetanus Toxid, Preservative Free 11/20/2004   Tdap 09/20/2015   Zoster Recombinant(Shingrix)  03/23/2021, 06/08/2021   Zoster, Live 08/06/2011    Screening Tests Health Maintenance  Topic Date Due   INFLUENZA VACCINE  09/19/2023   Medicare Annual Wellness (AWV)  06/25/2024   DTaP/Tdap/Td (3 - Td or Tdap) 09/19/2025   Pneumonia Vaccine 61+ Years old  Completed   Hepatitis C Screening  Completed   Zoster Vaccines- Shingrix  Completed   HPV VACCINES  Aged Out   Meningococcal B Vaccine  Aged Out   Colonoscopy  Discontinued   COVID-19 Vaccine  Discontinued    Health Maintenance  There are no preventive care reminders to display for this patient.  Health Maintenance Items Addressed: See Nurse Notes  Additional Screening:  Vision Screening: Recommended annual ophthalmology exams for early detection of glaucoma and other disorders of the eye.  Dental Screening: Recommended annual dental exams for proper oral hygiene  Community Resource Referral / Chronic Care Management: CRR required this visit?  No   CCM required this visit?  No     Plan:     I have personally reviewed and noted the following in the patient's chart:   Medical and social history  Use of alcohol, tobacco or illicit drugs  Current medications and supplements including opioid prescriptions. Patient is not currently taking opioid prescriptions. Functional ability and status Nutritional status Physical activity Advanced directives List of other physicians Hospitalizations, surgeries, and ER visits in previous 12 months Vitals Screenings to include cognitive, depression, and falls Referrals and appointments  In addition, I have reviewed and discussed with patient certain preventive protocols, quality metrics, and best practice recommendations. A written personalized care plan for preventive services as well as general preventive health recommendations were provided to patient.     Jaunita Messier, CMA   06/26/2023   After Visit Summary: (MyChart) Due to this being a telephonic visit, the after  visit summary with patients personalized plan was offered to patient via MyChart   Notes: Nothing significant to report at this time.

## 2023-07-09 DIAGNOSIS — H33332 Multiple defects of retina without detachment, left eye: Secondary | ICD-10-CM | POA: Diagnosis not present

## 2023-07-28 DIAGNOSIS — H33331 Multiple defects of retina without detachment, right eye: Secondary | ICD-10-CM | POA: Diagnosis not present

## 2023-08-07 DIAGNOSIS — L570 Actinic keratosis: Secondary | ICD-10-CM | POA: Diagnosis not present

## 2023-08-07 DIAGNOSIS — Z86018 Personal history of other benign neoplasm: Secondary | ICD-10-CM | POA: Diagnosis not present

## 2023-08-07 DIAGNOSIS — Z872 Personal history of diseases of the skin and subcutaneous tissue: Secondary | ICD-10-CM | POA: Diagnosis not present

## 2023-08-07 DIAGNOSIS — L578 Other skin changes due to chronic exposure to nonionizing radiation: Secondary | ICD-10-CM | POA: Diagnosis not present

## 2023-08-07 DIAGNOSIS — Z859 Personal history of malignant neoplasm, unspecified: Secondary | ICD-10-CM | POA: Diagnosis not present

## 2023-09-04 DIAGNOSIS — H31093 Other chorioretinal scars, bilateral: Secondary | ICD-10-CM | POA: Diagnosis not present

## 2023-09-04 DIAGNOSIS — H43813 Vitreous degeneration, bilateral: Secondary | ICD-10-CM | POA: Diagnosis not present

## 2023-09-04 DIAGNOSIS — H2513 Age-related nuclear cataract, bilateral: Secondary | ICD-10-CM | POA: Diagnosis not present

## 2023-09-04 DIAGNOSIS — H353131 Nonexudative age-related macular degeneration, bilateral, early dry stage: Secondary | ICD-10-CM | POA: Diagnosis not present

## 2023-09-11 DIAGNOSIS — H2513 Age-related nuclear cataract, bilateral: Secondary | ICD-10-CM | POA: Diagnosis not present

## 2023-09-11 DIAGNOSIS — H353132 Nonexudative age-related macular degeneration, bilateral, intermediate dry stage: Secondary | ICD-10-CM | POA: Diagnosis not present

## 2023-09-11 DIAGNOSIS — H25043 Posterior subcapsular polar age-related cataract, bilateral: Secondary | ICD-10-CM | POA: Diagnosis not present

## 2023-09-11 DIAGNOSIS — H18413 Arcus senilis, bilateral: Secondary | ICD-10-CM | POA: Diagnosis not present

## 2023-09-11 DIAGNOSIS — H2511 Age-related nuclear cataract, right eye: Secondary | ICD-10-CM | POA: Diagnosis not present

## 2023-11-12 DIAGNOSIS — H25011 Cortical age-related cataract, right eye: Secondary | ICD-10-CM | POA: Diagnosis not present

## 2023-11-12 DIAGNOSIS — H2511 Age-related nuclear cataract, right eye: Secondary | ICD-10-CM | POA: Diagnosis not present

## 2023-11-13 DIAGNOSIS — H2512 Age-related nuclear cataract, left eye: Secondary | ICD-10-CM | POA: Diagnosis not present

## 2023-11-19 ENCOUNTER — Encounter: Payer: Self-pay | Admitting: Family Medicine

## 2023-11-19 ENCOUNTER — Ambulatory Visit (INDEPENDENT_AMBULATORY_CARE_PROVIDER_SITE_OTHER): Admitting: Family Medicine

## 2023-11-19 VITALS — BP 133/69 | HR 49 | Temp 98.0°F | Ht 70.0 in | Wt 217.8 lb

## 2023-11-19 DIAGNOSIS — Z23 Encounter for immunization: Secondary | ICD-10-CM

## 2023-11-19 DIAGNOSIS — E785 Hyperlipidemia, unspecified: Secondary | ICD-10-CM

## 2023-11-19 DIAGNOSIS — I10 Essential (primary) hypertension: Secondary | ICD-10-CM | POA: Diagnosis not present

## 2023-11-19 DIAGNOSIS — F419 Anxiety disorder, unspecified: Secondary | ICD-10-CM | POA: Diagnosis not present

## 2023-11-19 DIAGNOSIS — Z0000 Encounter for general adult medical examination without abnormal findings: Secondary | ICD-10-CM

## 2023-11-19 DIAGNOSIS — J42 Unspecified chronic bronchitis: Secondary | ICD-10-CM | POA: Diagnosis not present

## 2023-11-19 DIAGNOSIS — K922 Gastrointestinal hemorrhage, unspecified: Secondary | ICD-10-CM

## 2023-11-19 DIAGNOSIS — N401 Enlarged prostate with lower urinary tract symptoms: Secondary | ICD-10-CM | POA: Diagnosis not present

## 2023-11-19 DIAGNOSIS — N138 Other obstructive and reflux uropathy: Secondary | ICD-10-CM | POA: Diagnosis not present

## 2023-11-19 LAB — MICROALBUMIN, URINE WAIVED
Creatinine, Urine Waived: 100 mg/dL (ref 10–300)
Microalb, Ur Waived: 30 mg/L — ABNORMAL HIGH (ref 0–19)
Microalb/Creat Ratio: <30 mg/g (ref <30)

## 2023-11-19 MED ORDER — SERTRALINE HCL 50 MG PO TABS: 50.0000 mg | ORAL_TABLET | Freq: Every day | ORAL | 1 refills | Status: AC

## 2023-11-19 MED ORDER — LORAZEPAM 0.5 MG PO TABS: 0.5000 mg | ORAL_TABLET | Freq: Every day | ORAL | 0 refills | Status: AC | PRN

## 2023-11-19 MED ORDER — LOSARTAN POTASSIUM 100 MG PO TABS: ORAL_TABLET | ORAL | 1 refills | Status: AC

## 2023-11-19 MED ORDER — HYDROCHLOROTHIAZIDE 25 MG PO TABS: ORAL_TABLET | ORAL | 1 refills | Status: AC

## 2023-11-19 MED ORDER — CARVEDILOL 25 MG PO TABS: 25.0000 mg | ORAL_TABLET | Freq: Two times a day (BID) | ORAL | 1 refills | Status: AC

## 2023-11-19 MED ORDER — AMLODIPINE BESYLATE 10 MG PO TABS: 10.0000 mg | ORAL_TABLET | Freq: Every day | ORAL | 1 refills | Status: AC

## 2023-11-19 MED ORDER — PANTOPRAZOLE SODIUM 40 MG PO TBEC: 40.0000 mg | DELAYED_RELEASE_TABLET | Freq: Every day | ORAL | 1 refills | Status: AC

## 2023-11-19 NOTE — Progress Notes (Signed)
 BP 133/69 (BP Location: Left Arm, Patient Position: Sitting, Cuff Size: Normal)   Pulse (!) 49   Temp 98 F (36.7 C) (Oral)   Ht 5' 10 (1.778 m)   Wt 217 lb 12.8 oz (98.8 kg)   SpO2 98%   BMI 31.25 kg/m    Subjective:    Patient ID: Jonathan Burns, male    DOB: 1945/02/20, 78 y.o.   MRN: [id number]  HPI: Jonathan Burns is a 78 y.o. male presenting on 11/19/2023 for comprehensive medical examination. Current medical complaints include:  HYPERTENSION / HYPERLIPIDEMIA Satisfied with current treatment? yes Duration of hypertension: months BP monitoring frequency: not checking BP medication side effects: no Past BP meds: amlodipine , hydrochlorothiazide , carvedilol , losartan  Duration of hyperlipidemia: chronic Cholesterol medication side effects: no Cholesterol supplements: fish oil Past cholesterol medications: none Medication compliance: excellent compliance Aspirin: no Recent stressors: no Recurrent headaches: no Visual changes: no Palpitations: no Dyspnea: no Chest pain: no Lower extremity edema: no Dizzy/lightheaded: no  ANXIETY/STRESS Duration: chronic Status:controlled Anxious mood: no  Excessive worrying: no Irritability: no  Sweating: no Nausea: no Palpitations:no Hyperventilation: no Panic attacks: no Agoraphobia: no  Obscessions/compulsions: no Depressed mood: no    11/19/2023    8:03 AM 06/26/2023   10:08 AM 05/01/2023    8:06 AM 11/01/2022   10:52 AM 06/17/2022   11:05 AM  Depression screen PHQ 2/9  Decreased Interest 0 0 0 0 0  Down, Depressed, Hopeless 0 0 0 0 0  PHQ - 2 Score 0 0 0 0 0  Altered sleeping 0 0 0 0 0  Tired, decreased energy 0 1 0 1 0  Change in appetite 0 0 0 0 0  Feeling bad or failure about yourself  0 0 0 0 0  Trouble concentrating 0 0 0 0 0  Moving slowly or fidgety/restless 0 0 0 0 0  Suicidal thoughts 0 0 0 0 0  PHQ-9 Score 0 1 0 1 0  Difficult doing work/chores Not difficult at all Not difficult at all  Not difficult at all  Not difficult at all   Anhedonia: no Weight changes: no Insomnia: no   Hypersomnia: no Fatigue/loss of energy: no Feelings of worthlessness: no Feelings of guilt: no Impaired concentration/indecisiveness: no Suicidal ideations: no  Crying spells: no Recent Stressors/Life Changes: no   Relationship problems: no   Family stress: no     Financial stress: no    Job stress: no    Recent death/loss: no  COPD COPD status: controlled Satisfied with current treatment?: yes Oxygen  use: no Dyspnea frequency: rarely Cough frequency: rarely Rescue inhaler frequency: never   Limitation of activity: no Productive cough: never Pneumovax: Up to Date Influenza: Up to Date   He currently lives with: alone Interim Problems from his last visit: no  Depression Screen done today and results listed below:     11/19/2023    8:03 AM 06/26/2023   10:08 AM 05/01/2023    8:06 AM 11/01/2022   10:52 AM 06/17/2022   11:05 AM  Depression screen PHQ 2/9  Decreased Interest 0 0 0 0 0  Down, Depressed, Hopeless 0 0 0 0 0  PHQ - 2 Score 0 0 0 0 0  Altered sleeping 0 0 0 0 0  Tired, decreased energy 0 1 0 1 0  Change in appetite 0 0 0 0 0  Feeling bad or failure about yourself  0 0 0 0 0  Trouble concentrating 0 0 0  0 0  Moving slowly or fidgety/restless 0 0 0 0 0  Suicidal thoughts 0 0 0 0 0  PHQ-9 Score 0 1 0 1 0  Difficult doing work/chores Not difficult at all Not difficult at all  Not difficult at all Not difficult at all    Past Medical History:  Past Medical History:  Diagnosis Date   Anemia    BPH (benign prostatic hypertrophy)    Cancer (HCC)    Chronic GI bleeding    COPD (chronic obstructive pulmonary disease) (HCC)    Hyperlipidemia    Hypertension    Prostate nodule    PUD (peptic ulcer disease)    Rising PSA level     Surgical History:  Past Surgical History:  Procedure Laterality Date   COLONOSCOPY  2015   ESOPHAGOGASTRODUODENOSCOPY  2015   FLEXIBLE BRONCHOSCOPY Left  01/20/2019   Procedure: FLEXIBLE BRONCHOSCOPY;  Surgeon: Volney Lye, MD;  Location: [facility];  Service: Thoracic;  Laterality: Left;   THORACOTOMY Left 01/20/2019   Procedure: CONVERTED TO THORACOTOMY MAJOR;  Surgeon: Volney Lye, MD;  Location: [facility];  Service: Thoracic;  Laterality: Left;   VIDEO ASSISTED THORACOSCOPY (VATS)/THOROCOTOMY Left 01/20/2019   Procedure: VIDEO ASSISTED THORACOSCOPY (VATS)/THOROCOTOMY;  Surgeon: Volney Lye, MD;  Location: [facility];  Service: Thoracic;  Laterality: Left;    Medications:  Current Outpatient Medications on File Prior to Visit  Medication Sig   BESIVANCE 0.6 % SUSP Apply to eye.   Bromfenac Sodium 0.07 % SOLN Apply 1 drop to eye 2 (two) times daily.   Difluprednate 0.05 % EMUL Apply 1 drop to eye 4 (four) times daily.   Multiple Vitamin (MULTIVITAMIN) tablet Take 1 tablet by mouth daily.   Multiple Vitamins-Minerals (PRESERVISION AREDS PO) Take 1 tablet by mouth 2 (two) times daily.   Omega-3 1000 MG CAPS Take 1 capsule by mouth daily.   valACYclovir (VALTREX) 500 MG tablet Take 500 mg by mouth daily.   No current facility-administered medications on file prior to visit.    Allergies:  No Known Allergies  Social History:  Social History   Socioeconomic History   Marital status: Widowed    Spouse name: Not on file   Number of children: 2   Years of education: Not on file   Highest education level: Associate degree: academic program  Occupational History   Not on file  Tobacco Use   Smoking status: Former    Current packs/day: 0.00    Average packs/day: 1 pack/day for 30.0 years (30.0 ttl pk-yrs)    Types: Cigarettes    Start date: 08/16/1956    Quit date: 08/17/1986    Years since quitting: 37.2    Passive exposure: Never   Smokeless tobacco: Never  Vaping Use   Vaping status: Never Used  Substance and Sexual Activity   Alcohol use: Yes    Alcohol/week: 2.0 standard drinks of alcohol    Types: 2 Standard drinks or  equivalent per week    Comment: 1 drink twice a week, mixes up between drinks   Drug use: No   Sexual activity: Not Currently  Other Topics Concern   Not on file  Social History Narrative   Owns [employer], 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   Involved in [place] activities    Social Drivers of Corporate investment banker Strain: Low Risk  (11/17/2023)   Overall Financial Resource Strain (CARDIA)    Difficulty of Paying Living Expenses: Not hard at all  Food Insecurity: No Food Insecurity (11/17/2023)   Hunger Vital Sign    Worried About Running Out of Food in the Last Year: Never true    Ran Out of Food in the Last Year: Never true  Transportation Needs: No Transportation Needs (11/17/2023)   PRAPARE - Administrator, Civil Service (Medical): No    Lack of Transportation (Non-Medical): No  Physical Activity: Insufficiently Active (11/17/2023)   Exercise Vital Sign    Days of Exercise per Week: 3 days    Minutes of Exercise per Session: 10 min  Stress: No Stress Concern Present (11/17/2023)   [employer] of Occupational Health - Occupational Stress Questionnaire    Feeling of Stress: Only a little  Social Connections: Moderately Integrated (11/17/2023)   Social Connection and Isolation Panel    Frequency of Communication with Friends and Family: More than three times a week    Frequency of Social Gatherings with Friends and Family: Three times a week    Attends Religious Services: More than 4 times per year    Active Member of Clubs or Organizations: Yes    Attends Banker Meetings: More than 4 times per year    Marital Status: Widowed  Intimate Partner Violence: Not At Risk (06/26/2023)   Humiliation, Afraid, Rape, and Kick questionnaire    Fear of Current or Ex-Partner: No    Emotionally Abused: No    Physically Abused: No    Sexually Abused: No   Social History   Tobacco Use  Smoking Status Former   Current packs/day: 0.00   Average  packs/day: 1 pack/day for 30.0 years (30.0 ttl pk-yrs)   Types: Cigarettes   Start date: 08/16/1956   Quit date: 08/17/1986   Years since quitting: 37.2   Passive exposure: Never  Smokeless Tobacco Never   Social History   Substance and Sexual Activity  Alcohol Use Yes   Alcohol/week: 2.0 standard drinks of alcohol   Types: 2 Standard drinks or equivalent per week   Comment: 1 drink twice a week, mixes up between drinks    Family History:  Family History  Problem Relation Age of Onset   Cancer Mother        breast   Diabetes Mother    Aneurysm Father    Kidney disease Neg Hx    Prostate cancer Neg Hx    Kidney cancer Neg Hx     Past medical history, surgical history, medications, allergies, family history and social history reviewed with patient today and changes made to appropriate areas of the chart.   Review of Systems  Constitutional: Negative.   HENT: Negative.    Eyes:  Positive for blurred vision. Negative for double vision, photophobia, pain, discharge and redness.  Respiratory: Negative.    Cardiovascular: Negative.   Gastrointestinal:  Positive for heartburn (with food choices). Negative for abdominal pain, blood in stool, constipation, diarrhea, melena, nausea and vomiting.  Genitourinary: Negative.   Musculoskeletal: Negative.   Skin: Negative.   Neurological: Negative.   Endo/Heme/Allergies: Negative.   Psychiatric/Behavioral: Negative.     All other ROS negative except what is listed above and in the HPI.      Objective:    BP 133/69 (BP Location: Left Arm, Patient Position: Sitting, Cuff Size: Normal)   Pulse (!) 49   Temp 98 F (36.7 C) (Oral)   Ht 5' 10 (1.778 m)   Wt 217 lb 12.8 oz (98.8 kg)   SpO2 98%   BMI  31.25 kg/m   Wt Readings from Last 3 Encounters:  11/19/23 217 lb 12.8 oz (98.8 kg)  06/26/23 210 lb (95.3 kg)  05/01/23 216 lb 6.4 oz (98.2 kg)    Physical Exam Vitals and nursing note reviewed.  Constitutional:      General: He  is not in acute distress.    Appearance: Normal appearance. He is obese. He is not ill-appearing, toxic-appearing or diaphoretic.  HENT:     Head: Normocephalic and atraumatic.     Right Ear: Tympanic membrane, ear canal and external ear normal. There is no impacted cerumen.     Left Ear: Tympanic membrane, ear canal and external ear normal. There is no impacted cerumen.     Nose: Nose normal. No congestion or rhinorrhea.     Mouth/Throat:     Mouth: Mucous membranes are moist.     Pharynx: Oropharynx is clear. No oropharyngeal exudate or posterior oropharyngeal erythema.  Eyes:     General: No scleral icterus.       Right eye: No discharge.        Left eye: No discharge.     Extraocular Movements: Extraocular movements intact.     Conjunctiva/sclera: Conjunctivae normal.     Pupils: Pupils are equal, round, and reactive to light.  Neck:     Vascular: No carotid bruit.  Cardiovascular:     Rate and Rhythm: Normal rate and regular rhythm.     Pulses: Normal pulses.     Heart sounds: No murmur heard.    No friction rub. No gallop.  Pulmonary:     Effort: Pulmonary effort is normal. No respiratory distress.     Breath sounds: Normal breath sounds. No stridor. No wheezing, rhonchi or rales.  Chest:     Chest wall: No tenderness.  Abdominal:     General: Abdomen is flat. Bowel sounds are normal. There is no distension.     Palpations: Abdomen is soft. There is no mass.     Tenderness: There is no abdominal tenderness. There is no right CVA tenderness, left CVA tenderness, guarding or rebound.     Hernia: No hernia is present.  Genitourinary:    Comments: Genital exam deferred with shared decision making Musculoskeletal:        General: No swelling, tenderness, deformity or signs of injury.     Cervical back: Normal range of motion and neck supple. No rigidity. No muscular tenderness.     Right lower leg: No edema.     Left lower leg: No edema.  Lymphadenopathy:     Cervical: No  cervical adenopathy.  Skin:    General: Skin is warm and dry.     Capillary Refill: Capillary refill takes less than 2 seconds.     Coloration: Skin is not jaundiced or pale.     Findings: No bruising, erythema, lesion or rash.  Neurological:     General: No focal deficit present.     Mental Status: He is alert and oriented to person, place, and time.     Cranial Nerves: No cranial nerve deficit.     Sensory: No sensory deficit.     Motor: No weakness.     Coordination: Coordination normal.     Gait: Gait normal.     Deep Tendon Reflexes: Reflexes normal.  Psychiatric:        Mood and Affect: Mood normal.        Behavior: Behavior normal.        Thought  Content: Thought content normal.        Judgment: Judgment normal.     Results for orders placed or performed in visit on 05/01/23  CBC with Differential/Platelet   Collection Time: 05/01/23  8:28 AM  Result Value Ref Range   WBC 4.7 3.4 - 10.8 x10E3/uL   RBC 4.52 4.14 - 5.80 x10E6/uL   Hemoglobin 14.3 13.0 - 17.7 g/dL   Hematocrit 55.6 62.4 - 51.0 %   MCV 98 (H) 79 - 97 fL   MCH 31.6 26.6 - 33.0 pg   MCHC 32.3 31.5 - 35.7 g/dL   RDW 86.7 88.3 - 84.5 %   Platelets 194 150 - 450 x10E3/uL   Neutrophils 66 Not Estab. %   Lymphs 13 Not Estab. %   Monocytes 14 Not Estab. %   Eos 6 Not Estab. %   Basos 1 Not Estab. %   Neutrophils Absolute 3.2 1.4 - 7.0 x10E3/uL   Lymphocytes Absolute 0.6 (L) 0.7 - 3.1 x10E3/uL   Monocytes Absolute 0.6 0.1 - 0.9 x10E3/uL   EOS (ABSOLUTE) 0.3 0.0 - 0.4 x10E3/uL   Basophils Absolute 0.0 0.0 - 0.2 x10E3/uL   Immature Granulocytes 0 Not Estab. %   Immature Grans (Abs) 0.0 0.0 - 0.1 x10E3/uL  Comprehensive metabolic panel   Collection Time: 05/01/23  8:28 AM  Result Value Ref Range   Glucose 98 70 - 99 mg/dL   BUN 16 8 - 27 mg/dL   Creatinine, Ser 9.11 0.76 - 1.27 mg/dL   eGFR 87 >40 fO/fpw/8.26   BUN/Creatinine Ratio 18 10 - 24   Sodium 139 134 - 144 mmol/L   Potassium 4.3 3.5 - 5.2  mmol/L   Chloride 99 96 - 106 mmol/L   CO2 24 20 - 29 mmol/L   Calcium  9.7 8.6 - 10.2 mg/dL   Total Protein 6.9 6.0 - 8.5 g/dL   Albumin  4.4 3.8 - 4.8 g/dL   Globulin, Total 2.5 1.5 - 4.5 g/dL   Bilirubin Total 0.4 0.0 - 1.2 mg/dL   Alkaline Phosphatase 79 44 - 121 IU/L   AST 20 0 - 40 IU/L   ALT 16 0 - 44 IU/L  Lipid Panel w/o Chol/HDL Ratio   Collection Time: 05/01/23  8:28 AM  Result Value Ref Range   Cholesterol, Total 185 100 - 199 mg/dL   Triglycerides 75 0 - 149 mg/dL   HDL 59 >60 mg/dL   VLDL Cholesterol Cal 14 5 - 40 mg/dL   LDL Chol Calc (NIH) 887 (H) 0 - 99 mg/dL  Ferritin   Collection Time: 05/01/23  8:28 AM  Result Value Ref Range   Ferritin 90 30 - 400 ng/mL  Iron  Binding Cap (TIBC)([facility])   Collection Time: 05/01/23  8:28 AM  Result Value Ref Range   Total Iron  Binding Capacity 321 250 - 450 ug/dL   UIBC 758 888 - 656 ug/dL   Iron  80 38 - 169 ug/dL   Iron  Saturation 25 15 - 55 %      Assessment & Plan:   Problem List Items Addressed This Visit       Cardiovascular and Mediastinum   HTN (hypertension), benign   Under good control on current regimen. Continue current regimen. Continue to monitor. Call with any concerns. Refills given. Labs drawn today.       Relevant Medications   amLODipine  (NORVASC ) 10 MG tablet   carvedilol  (COREG ) 25 MG tablet   hydrochlorothiazide  (HYDRODIURIL ) 25 MG tablet   losartan  (  COZAAR ) 100 MG tablet   Other Relevant Orders   Comprehensive metabolic panel with GFR   CBC with Differential/Platelet   TSH   Microalbumin, Urine Waived     Respiratory   COPD (chronic obstructive pulmonary disease) (HCC)   Under good control on current regimen. Continue current regimen. Continue to monitor. Call with any concerns. Refills given. Labs drawn today.       Relevant Orders   Comprehensive metabolic panel with GFR   CBC with Differential/Platelet     Digestive   Chronic GI bleeding   Rechecking labs today. Await  results. Treat as needed.       Relevant Medications   pantoprazole  (PROTONIX ) 40 MG tablet     Genitourinary   BPH with obstruction/lower urinary tract symptoms   Doing well not on medicine. Continue to monitor. Call with any concerns. Labs drawn today.       Relevant Orders   Comprehensive metabolic panel with GFR   CBC with Differential/Platelet   PSA     Other   Hyperlipidemia   Under good control on current regimen. Continue current regimen. Continue to monitor. Call with any concerns. Refills given. Labs drawn today.        Relevant Medications   amLODipine  (NORVASC ) 10 MG tablet   carvedilol  (COREG ) 25 MG tablet   hydrochlorothiazide  (HYDRODIURIL ) 25 MG tablet   losartan  (COZAAR ) 100 MG tablet   Other Relevant Orders   Comprehensive metabolic panel with GFR   CBC with Differential/Platelet   Lipid Panel w/o Chol/HDL Ratio   Acute anxiety   Under good control on current regimen. Continue current regimen. Continue to monitor. Call with any concerns. Refills given.        Relevant Medications   LORazepam  (ATIVAN ) 0.5 MG tablet   sertraline  (ZOLOFT ) 50 MG tablet   Other Visit Diagnoses       Routine general medical examination at a health care facility    -  Primary   Vaccines up to date. Screening labs checked today. Continue diet and exercise. Call with any concerns.        LABORATORY TESTING:  Health maintenance labs ordered today as discussed above.   The natural history of prostate cancer and ongoing controversy regarding screening and potential treatment outcomes of prostate cancer has been discussed with the patient. The meaning of a false positive PSA and a false negative PSA has been discussed. He indicates understanding of the limitations of this screening test and wishes to proceed with screening PSA testing.   IMMUNIZATIONS:   - Tdap: Tetanus vaccination status reviewed: last tetanus booster within 10 years. - Influenza: Administered today -  Pneumovax: Up to date - Prevnar: Up to date - COVID: Refused - HPV: Not applicable - Shingrix vaccine: Up to date  SCREENING: - Colonoscopy: Not applicable  Discussed with patient purpose of the colonoscopy is to detect colon cancer at curable precancerous or early stages   PATIENT COUNSELING:    Sexuality: Discussed sexually transmitted diseases, partner selection, use of condoms, avoidance of unintended pregnancy  and contraceptive alternatives.   Advised to avoid cigarette smoking.  I discussed with the patient that most people either abstain from alcohol or drink within safe limits (<=14/week and <=4 drinks/occasion for males, <=7/weeks and <= 3 drinks/occasion for females) and that the risk for alcohol disorders and other health effects rises proportionally with the number of drinks per week and how often a drinker exceeds daily limits.  Discussed cessation/primary prevention of  drug use and availability of treatment for abuse.   Diet: Encouraged to adjust caloric intake to maintain  or achieve ideal body weight, to reduce intake of dietary saturated fat and total fat, to limit sodium intake by avoiding high sodium foods and not adding table salt, and to maintain adequate dietary potassium and calcium  preferably from fresh fruits, vegetables, and low-fat dairy products.    stressed the importance of regular exercise  Injury prevention: Discussed safety belts, safety helmets, smoke detector, smoking near bedding or upholstery.   Dental health: Discussed importance of regular tooth brushing, flossing, and dental visits.   Follow up plan: NEXT PREVENTATIVE PHYSICAL DUE IN 1 YEAR. Return in about 6 months (around 05/19/2024).

## 2023-11-19 NOTE — Assessment & Plan Note (Signed)
 Under good control on current regimen. Continue current regimen. Continue to monitor. Call with any concerns. Refills given. Labs drawn today.

## 2023-11-19 NOTE — Assessment & Plan Note (Signed)
 Doing well not on medicine. Continue to monitor. Call with any concerns. Labs drawn today.

## 2023-11-19 NOTE — Assessment & Plan Note (Signed)
 Rechecking labs today. Await results. Treat as needed.

## 2023-11-19 NOTE — Assessment & Plan Note (Signed)
 Under good control on current regimen. Continue current regimen. Continue to monitor. Call with any concerns. Refills given.

## 2023-11-20 LAB — CBC WITH DIFFERENTIAL/PLATELET
Basophils Absolute: 0 x10E3/uL (ref 0.0–0.2)
Basos: 0 %
EOS (ABSOLUTE): 0.2 x10E3/uL (ref 0.0–0.4)
Eos: 3 %
Hematocrit: 48.3 % (ref 37.5–51.0)
Hemoglobin: 15.7 g/dL (ref 13.0–17.7)
Immature Grans (Abs): 0 x10E3/uL (ref 0.0–0.1)
Immature Granulocytes: 0 %
Lymphocytes Absolute: 0.8 x10E3/uL (ref 0.7–3.1)
Lymphs: 13 %
MCH: 32.7 pg (ref 26.6–33.0)
MCHC: 32.5 g/dL (ref 31.5–35.7)
MCV: 101 fL — ABNORMAL HIGH (ref 79–97)
Monocytes Absolute: 0.7 x10E3/uL (ref 0.1–0.9)
Monocytes: 12 %
Neutrophils Absolute: 4.4 x10E3/uL (ref 1.4–7.0)
Neutrophils: 72 %
Platelets: 210 x10E3/uL (ref 150–450)
RBC: 4.8 x10E6/uL (ref 4.14–5.80)
RDW: 13 % (ref 11.6–15.4)
WBC: 6.1 x10E3/uL (ref 3.4–10.8)

## 2023-11-20 LAB — COMPREHENSIVE METABOLIC PANEL WITH GFR
ALT: 19 IU/L (ref 0–44)
AST: 15 IU/L (ref 0–40)
Albumin: 4.4 g/dL (ref 3.8–4.8)
Alkaline Phosphatase: 84 IU/L (ref 47–123)
BUN/Creatinine Ratio: 15 (ref 10–24)
BUN: 15 mg/dL (ref 8–27)
Bilirubin Total: 0.5 mg/dL (ref 0.0–1.2)
CO2: 25 mmol/L (ref 20–29)
Calcium: 9.5 mg/dL (ref 8.6–10.2)
Chloride: 99 mmol/L (ref 96–106)
Creatinine, Ser: 0.98 mg/dL (ref 0.76–1.27)
Globulin, Total: 2.3 g/dL (ref 1.5–4.5)
Glucose: 97 mg/dL (ref 70–99)
Potassium: 4.2 mmol/L (ref 3.5–5.2)
Sodium: 139 mmol/L (ref 134–144)
Total Protein: 6.7 g/dL (ref 6.0–8.5)
eGFR: 78 mL/min/1.73 (ref >59)

## 2023-11-20 LAB — LIPID PANEL W/O CHOL/HDL RATIO
Cholesterol, Total: 196 mg/dL (ref 100–199)
HDL: 58 mg/dL (ref >39)
LDL Chol Calc (NIH): 118 mg/dL — ABNORMAL HIGH (ref 0–99)
Triglycerides: 113 mg/dL (ref 0–149)
VLDL Cholesterol Cal: 20 mg/dL (ref 5–40)

## 2023-11-20 LAB — TSH: TSH: 4.25 u[IU]/mL (ref 0.450–4.500)

## 2023-11-20 LAB — PSA: Prostate Specific Ag, Serum: <0.1 ng/mL (ref 0.0–4.0)

## 2023-11-21 ENCOUNTER — Ambulatory Visit: Payer: Self-pay | Admitting: Family Medicine

## 2023-11-26 DIAGNOSIS — H2512 Age-related nuclear cataract, left eye: Secondary | ICD-10-CM | POA: Diagnosis not present

## 2024-01-18 ENCOUNTER — Other Ambulatory Visit: Payer: Self-pay | Admitting: Family Medicine

## 2024-01-18 DIAGNOSIS — N138 Other obstructive and reflux uropathy: Secondary | ICD-10-CM

## 2024-01-22 NOTE — Telephone Encounter (Signed)
 Discontinued by provider on 11/19/23, refusing due to this.   Requested Prescriptions  Pending Prescriptions Disp Refills   tamsulosin  (FLOMAX ) 0.4 MG CAPS capsule [Pharmacy Med Name: TAMSULOSIN  HCL 0.4 MG CAPSULE] 90 capsule 0    Sig: Take 1 capsule (0.4 mg total)by mouth daily.     Urology: Alpha-Adrenergic Blocker Passed - 01/22/2024  1:02 PM      Passed - PSA in normal range and within 360 days    Prostate Specific Ag, Serum  Date Value Ref Range Status  11/19/2023 <0.1 0.0 - 4.0 ng/mL Final    Comment:    Roche ECLIA methodology. According to the American Urological Association, Serum PSA should decrease and remain at undetectable levels after radical prostatectomy. The AUA defines biochemical recurrence as an initial PSA value 0.2 ng/mL or greater followed by a subsequent confirmatory PSA value 0.2 ng/mL or greater. Values obtained with different assay methods or kits cannot be used interchangeably. Results cannot be interpreted as absolute evidence of the presence or absence of malignant disease.          Passed - Last BP in normal range    BP Readings from Last 1 Encounters:  11/19/23 133/69         Passed - Valid encounter within last 12 months    Recent Outpatient Visits           2 months ago Routine general medical examination at a health care facility   [facility] [place] P, DO   8 months ago Hyperlipidemia, unspecified hyperlipidemia type   [place] [facility] Vicci Duwaine SQUIBB, DO       Future Appointments             In 3 months McGowan, Clotilda DELENA RIGGERS [facility] [place]

## 2024-04-29 ENCOUNTER — Other Ambulatory Visit

## 2024-05-06 ENCOUNTER — Ambulatory Visit: Admitting: Urology

## 2024-05-19 ENCOUNTER — Ambulatory Visit: Admitting: Family Medicine

## 2024-07-08 ENCOUNTER — Ambulatory Visit
